# Patient Record
Sex: Female | Born: 1960 | Race: White | Hispanic: No | Marital: Single | Smoking: Former smoker
Health system: Southern US, Community
[De-identification: ages and names within clinical notes are randomized; demographics above are authoritative.]

## PROBLEM LIST (undated history)

## (undated) DIAGNOSIS — R002 Palpitations: Secondary | ICD-10-CM

## (undated) DIAGNOSIS — K5900 Constipation, unspecified: Secondary | ICD-10-CM

## (undated) DIAGNOSIS — M545 Low back pain: Principal | ICD-10-CM

## (undated) DIAGNOSIS — D500 Iron deficiency anemia secondary to blood loss (chronic): Secondary | ICD-10-CM

## (undated) DIAGNOSIS — M5020 Other cervical disc displacement, unspecified cervical region: Secondary | ICD-10-CM

## (undated) DIAGNOSIS — I10 Essential (primary) hypertension: Secondary | ICD-10-CM

## (undated) DIAGNOSIS — B182 Chronic viral hepatitis C: Secondary | ICD-10-CM

## (undated) DIAGNOSIS — F32A Depression, unspecified: Secondary | ICD-10-CM

## (undated) DIAGNOSIS — F419 Anxiety disorder, unspecified: Secondary | ICD-10-CM

## (undated) DIAGNOSIS — G8929 Other chronic pain: Secondary | ICD-10-CM

## (undated) DIAGNOSIS — F329 Major depressive disorder, single episode, unspecified: Secondary | ICD-10-CM

## (undated) DIAGNOSIS — D260 Other benign neoplasm of cervix uteri: Secondary | ICD-10-CM

## (undated) HISTORY — DX: Constipation, unspecified: K59.00

## (undated) HISTORY — DX: Major depressive disorder, single episode, unspecified: F32.9

## (undated) HISTORY — DX: Chronic viral hepatitis C: B18.2

## (undated) HISTORY — DX: Palpitations: R00.2

## (undated) HISTORY — PX: TONSILLECTOMY: SUR1361

## (undated) HISTORY — DX: Other chronic pain: G89.29

## (undated) HISTORY — DX: Anxiety disorder, unspecified: F41.9

## (undated) HISTORY — PX: OTHER SURGICAL HISTORY: SHX169

## (undated) HISTORY — PX: VULVECTOMY: SHX1086

## (undated) HISTORY — PX: HAND SURGERY: SHX662

## (undated) HISTORY — PX: DILATION AND CURETTAGE OF UTERUS: SHX78

## (undated) HISTORY — DX: Other benign neoplasm of cervix uteri: D26.0

## (undated) HISTORY — DX: Low back pain: M54.5

## (undated) HISTORY — DX: Essential (primary) hypertension: I10

## (undated) HISTORY — DX: Depression, unspecified: F32.A

## (undated) HISTORY — DX: Other cervical disc displacement, unspecified cervical region: M50.20

---
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
## 2008-08-29 ENCOUNTER — Emergency Department: Payer: Self-pay | Admitting: Emergency Medicine

## 2008-08-29 ENCOUNTER — Other Ambulatory Visit: Payer: Self-pay

## 2009-02-09 ENCOUNTER — Ambulatory Visit: Payer: Self-pay | Admitting: Family Medicine

## 2009-02-13 ENCOUNTER — Ambulatory Visit: Payer: Self-pay | Admitting: Family Medicine

## 2009-03-02 ENCOUNTER — Ambulatory Visit: Payer: Self-pay | Admitting: Internal Medicine

## 2013-05-23 ENCOUNTER — Emergency Department: Payer: Self-pay | Admitting: Emergency Medicine

## 2013-06-05 ENCOUNTER — Emergency Department: Payer: Self-pay | Admitting: Emergency Medicine

## 2013-06-25 ENCOUNTER — Encounter: Payer: Self-pay | Admitting: Anesthesiology

## 2013-07-04 ENCOUNTER — Encounter: Payer: Self-pay | Admitting: Anesthesiology

## 2013-11-04 ENCOUNTER — Inpatient Hospital Stay: Payer: Self-pay | Admitting: Internal Medicine

## 2013-11-04 ENCOUNTER — Ambulatory Visit: Payer: Self-pay | Admitting: Physician Assistant

## 2013-11-04 LAB — DRUG SCREEN, URINE
Amphetamines, Ur Screen: NEGATIVE (ref ?–1000)
MDMA (Ecstasy)Ur Screen: NEGATIVE (ref ?–500)
Methadone, Ur Screen: NEGATIVE (ref ?–300)
Opiate, Ur Screen: NEGATIVE (ref ?–300)
Phencyclidine (PCP) Ur S: NEGATIVE (ref ?–25)
Tricyclic, Ur Screen: POSITIVE (ref ?–1000)

## 2013-11-04 LAB — CBC
HGB: 13.6 g/dL (ref 12.0–16.0)
MCH: 30.4 pg (ref 26.0–34.0)
MCV: 90 fL (ref 80–100)
RBC: 4.47 10*6/uL (ref 3.80–5.20)
RDW: 14.2 % (ref 11.5–14.5)

## 2013-11-04 LAB — URINALYSIS, COMPLETE
Bacteria: NONE SEEN
Bilirubin,UR: NEGATIVE
Glucose,UR: NEGATIVE mg/dL (ref 0–75)
Nitrite: NEGATIVE
Protein: NEGATIVE
RBC,UR: NONE SEEN /HPF (ref 0–5)

## 2013-11-04 LAB — COMPREHENSIVE METABOLIC PANEL
Bilirubin,Total: 0.2 mg/dL (ref 0.2–1.0)
Calcium, Total: 8.9 mg/dL (ref 8.5–10.1)
Chloride: 110 mmol/L — ABNORMAL HIGH (ref 98–107)
Co2: 31 mmol/L (ref 21–32)
Glucose: 58 mg/dL — ABNORMAL LOW (ref 65–99)
Osmolality: 279 (ref 275–301)
Sodium: 141 mmol/L (ref 136–145)
Total Protein: 7.3 g/dL (ref 6.4–8.2)

## 2013-11-04 LAB — SALICYLATE LEVEL: Salicylates, Serum: 3 mg/dL — ABNORMAL HIGH

## 2013-11-04 LAB — ETHANOL
Ethanol %: <0.003 % (ref 0.000–0.080)
Ethanol: <3 mg/dL

## 2013-11-05 LAB — TRIGLYCERIDES: Triglycerides: 149 mg/dL (ref 0–200)

## 2013-11-05 LAB — BASIC METABOLIC PANEL
Anion Gap: 7 (ref 7–16)
Calcium, Total: 8.3 mg/dL — ABNORMAL LOW (ref 8.5–10.1)
Chloride: 113 mmol/L — ABNORMAL HIGH (ref 98–107)
Co2: 22 mmol/L (ref 21–32)
EGFR (African American): >60
EGFR (Non-African Amer.): >60
Osmolality: 281 (ref 275–301)
Sodium: 142 mmol/L (ref 136–145)

## 2013-11-05 LAB — PHOSPHORUS: Phosphorus: 2.9 mg/dL (ref 2.5–4.9)

## 2013-11-06 LAB — MAGNESIUM: Magnesium: 1.6 mg/dL — ABNORMAL LOW

## 2014-08-06 ENCOUNTER — Encounter: Payer: Self-pay | Admitting: Neurology

## 2014-08-11 ENCOUNTER — Telehealth: Payer: Self-pay | Admitting: Neurology

## 2014-08-11 ENCOUNTER — Ambulatory Visit: Payer: Medicaid Other | Admitting: Neurology

## 2014-08-11 NOTE — Telephone Encounter (Signed)
This patient did not show for a new patient appointment today. 

## 2014-08-18 ENCOUNTER — Ambulatory Visit: Payer: Self-pay | Admitting: Neurology

## 2014-08-18 ENCOUNTER — Ambulatory Visit: Payer: Medicaid Other | Admitting: Neurology

## 2014-08-26 ENCOUNTER — Other Ambulatory Visit: Payer: Self-pay | Admitting: Neurology

## 2014-08-26 ENCOUNTER — Ambulatory Visit (INDEPENDENT_AMBULATORY_CARE_PROVIDER_SITE_OTHER): Payer: Medicaid Other | Admitting: Neurology

## 2014-08-26 ENCOUNTER — Encounter (INDEPENDENT_AMBULATORY_CARE_PROVIDER_SITE_OTHER): Payer: Self-pay

## 2014-08-26 ENCOUNTER — Encounter: Payer: Self-pay | Admitting: Neurology

## 2014-08-26 VITALS — BP 135/91 | HR 95 | Ht 66.0 in | Wt 159.0 lb

## 2014-08-26 DIAGNOSIS — G8929 Other chronic pain: Secondary | ICD-10-CM

## 2014-08-26 DIAGNOSIS — M545 Low back pain, unspecified: Secondary | ICD-10-CM

## 2014-08-26 HISTORY — DX: Other chronic pain: G89.29

## 2014-08-26 HISTORY — DX: Low back pain, unspecified: M54.50

## 2014-08-26 MED ORDER — AMITRIPTYLINE HCL 25 MG PO TABS: 50.0000 mg | ORAL_TABLET | Freq: Every day | ORAL | Status: DC

## 2014-08-26 MED ORDER — TRAMADOL HCL 50 MG PO TABS: 50.0000 mg | ORAL_TABLET | Freq: Four times a day (QID) | ORAL | Status: DC | PRN

## 2014-08-26 NOTE — Telephone Encounter (Signed)
Pt's Rx was faxed to [phone], confirmation received.

## 2014-08-26 NOTE — Progress Notes (Signed)
Reason for visit: Chronic low back pain  Brenda Middleton is a 53 y.o. female  History of present illness:  Brenda Middleton is a 53 year old white female with a history of chronic low back pain that began around 2008. The patient has also had some discomfort in the neck area, and she has had issues with her neck since 2007. The patient has been followed through [facility] at the pain Center. The patient has had several MRI evaluations of the cervical and lumbosacral spine. The last MRI lumbosacral spine has shown evidence of multilevel degenerative disc disease most significant at the Brenda Middleton and Brenda Middleton levels. The patient has disc bulges at these levels with facet joint hypertrophy with mild spinal stenosis and moderate left neuroforaminal stenosis at Brenda Middleton level, moderate to severe neuroforaminal stenosis at Brenda Middleton level bilaterally. There may be moderate to severe spinal stenosis at the Brenda Middleton level. In the past, the patient has gained benefit with epidural steroid injections that may last for 8 months. The patient received an epidural steroid injection 18 months ago that resulted in a footdrop on the left that took 6-8 months to fully recover. The patient has not received any epidural steroid injection since that time. The patient has had MRI evaluation of the cervical spine that shows evidence of degenerative changes at the Brenda Middleton level with moderate to severe neuroforaminal stenosis on the left side. The patient has been treated with gabapentin in the past without benefit. She has been taking Ultram 50 mg twice daily. She is on a low dose of amitriptyline taking 25 mg at night. The patient finds that she feels better if she continues to stretch and move, she feels worse if she sits too long. She has difficulty sleeping at night. She denies issues controlling the bowels or the bladder, but she has had some urinary retention problems in the past. She does report a mild gait problem. She indicates that her back  pain is mainly on the right side, with pain radiating down the right leg to the foot. She has had EMG and nerve conduction studies done previously, but I do not have the results of these studies. She comes to this office for an evaluation.  Past Medical History  Diagnosis Date  . Benign neoplasm of cervix uteri   . Anxiety   . Depressive disorder   . Displacement of cervical intervertebral disc   . Prolapsed cervical intervertebral disc   . Chronic hepatitis C without mention of hepatic coma   . Hypertensive disorder   . Constipation   . Palpitations   . Chronic low back pain 08/26/2014    Past Surgical History  Procedure Laterality Date  . Tonsillectomy    . Conization cervix    . Dilation and curettage of uterus    . Vulvectomy      Family History  Problem Relation Age of Onset  . Cancer    . Stroke    . Breast cancer Mother   . Lung cancer Father   . Cancer Brother     Social history:  reports that she has been smoking Cigarettes.  She has a 24.5 pack-year smoking history. She has never used smokeless tobacco. She reports that she drinks alcohol. She reports that she does not use illicit drugs.  Medications:  Current Outpatient Prescriptions on File Prior to Visit  Medication Sig Dispense Refill  . albuterol (PROVENTIL HFA;VENTOLIN HFA) 108 (90 BASE) MCG/ACT inhaler Inhale 2 puffs into the lungs every  4 (four) hours as needed for wheezing or shortness of breath.      . clonazePAM (KLONOPIN) 0.5 MG tablet Take 0.5 mg by mouth 2 (two) times daily.      . cyclobenzaprine (FLEXERIL) 10 MG tablet Take 10 mg by mouth 3 (three) times daily.      Marland Kitchen lisinopril (PRINIVIL,ZESTRIL) 10 MG tablet Take 10 mg by mouth daily.      . temazepam (RESTORIL) 30 MG capsule Take 30 mg by mouth at bedtime.      Marland Kitchen Umeclidinium-Vilanterol (ANORO ELLIPTA) 62.5-25 MCG/INH AEPB Inhale 1 puff into the lungs daily.      . varenicline (CHANTIX) 1 MG tablet Take 1 mg by mouth 2 (two) times daily.        No current facility-administered medications on file prior to visit.      Allergies  Allergen Reactions  . Penicillin G     Other reaction(s): HIVES  . Penicillin V Itching  . Penicillins     ROS:  Out of a complete 14 system review of symptoms, the patient complains only of the following symptoms, and all other reviewed systems are negative.  Snoring Depression, anxiety, insomnia Restless legs Back pain  Blood pressure 135/91, pulse 95, height 5\' 6"  (1.676 m), weight 159 lb (72.122 kg).  Physical Exam  General: The patient is alert and cooperative at the time of the examination.  Eyes: Pupils are equal, round, and reactive to light. Discs are flat bilaterally.  Neck: The neck is supple, no carotid bruits are noted.  Respiratory: The respiratory examination is clear.  Cardiovascular: The cardiovascular examination reveals a regular rate and rhythm, no obvious murmurs or rubs are noted.  Neuromuscular: The patient has good range of movement of the lumbosacral spine. Minimal discomfort is noted with palpation of the low back.  Skin: Extremities are without significant edema.  Neurologic Exam  Mental status: The patient is alert and oriented x 3 at the time of the examination. The patient has apparent normal recent and remote memory, with an apparently normal attention span and concentration ability.  Cranial nerves: Facial symmetry is present. There is good sensation of the face to pinprick and soft touch bilaterally. The strength of the facial muscles and the muscles to head turning and shoulder shrug are normal bilaterally. Speech is well enunciated, no aphasia or dysarthria is noted. Extraocular movements are full. Visual fields are full. The tongue is midline, and the patient has symmetric elevation of the soft palate. No obvious hearing deficits are noted.  Motor: The motor testing reveals 5 over 5 strength of all 4 extremities. Good symmetric motor tone is noted  throughout. The patient is able to walk on heels and the toes bilaterally.  Sensory: Sensory testing is intact to pinprick, soft touch, vibration sensation, and position sense on all 4 extremities. No evidence of extinction is noted.  Coordination: Cerebellar testing reveals good finger-nose-finger and heel-to-shin bilaterally.  Gait and station: Gait is normal. Tandem gait is normal. Romberg is negative. No drift is seen.  Reflexes: Deep tendon reflexes are symmetric and normal bilaterally. Toes are downgoing bilaterally.   Assessment/Plan:  One. Chronic low back pain, right leg pain  The patient continues to have ongoing back pain, mainly involving the right leg. The patient will be increased on amitriptyline taking 50 mg night, and the dose will be increased if she needs more. The patient was sent for a TENS unit trial. In the future, the patient may be a  candidate for a spinal stimulator. We may consider EMG and nerve conduction studies in the future. I will increase the Ultram taking 50 mg 3 times daily. She will followup in 3 months.  Jill Alexanders MD 08/26/2014 9:10 PM  [facility] [address] [place],  [address]  Phone [phone] Fax [phone]

## 2014-08-26 NOTE — Patient Instructions (Signed)
Back Pain, Adult Low back pain is very common. About 1 in 5 people have back pain.The cause of low back pain is rarely dangerous. The pain often gets better over time.About half of people with a sudden onset of back pain feel better in just 2 weeks. About 8 in 10 people feel better by 6 weeks.  CAUSES Some common causes of back pain include:  Strain of the muscles or ligaments supporting the spine.  Wear and tear (degeneration) of the spinal discs.  Arthritis.  Direct injury to the back. DIAGNOSIS Most of the time, the direct cause of low back pain is not known.However, back pain can be treated effectively even when the exact cause of the pain is unknown.Answering your caregiver's questions about your overall health and symptoms is one of the most accurate ways to make sure the cause of your pain is not dangerous. If your caregiver needs more information, he or she may order lab work or imaging tests (X-rays or MRIs).However, even if imaging tests show changes in your back, this usually does not require surgery. HOME CARE INSTRUCTIONS For many people, back pain returns.Since low back pain is rarely dangerous, it is often a condition that people can learn to manageon their own.   Remain active. It is stressful on the back to sit or stand in one place. Do not sit, drive, or stand in one place for more than 30 minutes at a time. Take short walks on level surfaces as soon as pain allows.Try to increase the length of time you walk each day.  Do not stay in bed.Resting more than 1 or 2 days can delay your recovery.  Do not avoid exercise or work.Your body is made to move.It is not dangerous to be active, even though your back may hurt.Your back will likely heal faster if you return to being active before your pain is gone.  Pay attention to your body when you bend and lift. Many people have less discomfortwhen lifting if they bend their knees, keep the load close to their bodies,and  avoid twisting. Often, the most comfortable positions are those that put less stress on your recovering back.  Find a comfortable position to sleep. Use a firm mattress and lie on your side with your knees slightly bent. If you lie on your back, put a pillow under your knees.  Only take over-the-counter or prescription medicines as directed by your caregiver. Over-the-counter medicines to reduce pain and inflammation are often the most helpful.Your caregiver may prescribe muscle relaxant drugs.These medicines help dull your pain so you can more quickly return to your normal activities and healthy exercise.  Put ice on the injured area.  Put ice in a plastic bag.  Place a towel between your skin and the bag.  Leave the ice on for 15-20 minutes, 03-04 times a day for the first 2 to 3 days. After that, ice and heat may be alternated to reduce pain and spasms.  Ask your caregiver about trying back exercises and gentle massage. This may be of some benefit.  Avoid feeling anxious or stressed.Stress increases muscle tension and can worsen back pain.It is important to recognize when you are anxious or stressed and learn ways to manage it.Exercise is a great option. SEEK MEDICAL CARE IF:  You have pain that is not relieved with rest or medicine.  You have pain that does not improve in 1 week.  You have new symptoms.  You are generally not feeling well. SEEK   IMMEDIATE MEDICAL CARE IF:   You have pain that radiates from your back into your legs.  You develop new bowel or bladder control problems.  You have unusual weakness or numbness in your arms or legs.  You develop nausea or vomiting.  You develop abdominal pain.  You feel faint. Document Released: 11/20/2005 Document Revised: 05/21/2012 Document Reviewed: 03/24/2014 Exit[employer] Patient Information 2015 [employer]. This information is not intended to replace advice given to you by your health care provider. Make sure you  discuss any questions you have with your health care provider.  

## 2014-08-27 ENCOUNTER — Telehealth: Payer: Self-pay | Admitting: Neurology

## 2014-08-27 NOTE — Telephone Encounter (Signed)
I called patient. The neuro rehabilitation people will call her with the appointment, I did increase her tramadol dosage to 3 a day. The patient was given 90 tablets, 2 refills.

## 2014-08-27 NOTE — Telephone Encounter (Signed)
Patient calling to state that she thought her Tramadol dosage would be increased but when she went to pick it up, it's still the same amount. Patient is also wondering about the physical therapy referral. Please return call and advise.

## 2014-08-27 NOTE — Telephone Encounter (Signed)
SEE PHONE NOTE: I TOLD THE PATIENT YESTERDAY SHE WOULD BE CONTACTED BY THE NEURO-REHABILTATION FOR APPOINTMENT.

## 2014-09-16 ENCOUNTER — Ambulatory Visit: Payer: Self-pay | Admitting: Physical Therapy

## 2014-09-30 ENCOUNTER — Encounter: Payer: Self-pay | Admitting: *Deleted

## 2014-10-12 ENCOUNTER — Ambulatory Visit (INDEPENDENT_AMBULATORY_CARE_PROVIDER_SITE_OTHER): Payer: Medicaid Other | Admitting: General Surgery

## 2014-10-12 ENCOUNTER — Telehealth: Payer: Self-pay | Admitting: *Deleted

## 2014-10-12 ENCOUNTER — Encounter: Payer: Self-pay | Admitting: General Surgery

## 2014-10-12 VITALS — BP 124/76 | HR 74 | Resp 12 | Ht 66.0 in | Wt 157.0 lb

## 2014-10-12 DIAGNOSIS — K8010 Calculus of gallbladder with chronic cholecystitis without obstruction: Secondary | ICD-10-CM

## 2014-10-12 NOTE — Progress Notes (Signed)
Patient ID: Brenda Middleton, female   DOB: [birth year], 53 y.o.   MRN: [id number]  Chief Complaint  Patient presents with  . Other    evaluation of gallstones    HPI Brenda Middleton is a 53 y.o. female who presents for an evaluation of gallstones. She complains of abdominal pain that is located in the right upper quadrant. The pain does radiate to her shoulder as well as her back. The pain lasts anywhere from 5-30 minutes at a time. She also states she is having problems with nausea, vomiting, and constipation. This has been present for a couple of weeks. She is unable to eat fruits or spicy foods. She complains of reflux as well.  HPI  Past Medical History  Diagnosis Date  . Benign neoplasm of cervix uteri   . Anxiety   . Depressive disorder   . Displacement of cervical intervertebral disc   . Prolapsed cervical intervertebral disc   . Chronic hepatitis C without mention of hepatic coma   . Hypertensive disorder   . Constipation   . Palpitations   . Chronic low back pain 08/26/2014    Past Surgical History  Procedure Laterality Date  . Tonsillectomy    . Conization cervix    . Dilation and curettage of uterus    . Vulvectomy    . Hand surgery  2008,2015    Carpel Tunnel    Family History  Problem Relation Age of Onset  . Cancer    . Stroke    . Breast cancer Mother   . Lung cancer Father   . Cancer Brother     Social History History  Substance Use Topics  . Smoking status: Current Every Day Smoker -- 0.70 packs/day for 35 years    Types: Cigarettes  . Smokeless tobacco: Never Used  . Alcohol Use: Yes     Comment: occasional wine    Allergies  Allergen Reactions  . Penicillin G     Other reaction(s): HIVES  . Penicillin V Itching  . Penicillins     Current Outpatient Prescriptions  Medication Sig Dispense Refill  . albuterol (PROVENTIL HFA;VENTOLIN HFA) 108 (90 BASE) MCG/ACT inhaler Inhale 2 puffs into the lungs every 4 (four) hours as needed for wheezing  or shortness of breath.    Marland Kitchen amitriptyline (ELAVIL) 25 MG tablet Take 2 tablets (50 mg total) by mouth at bedtime. 60 tablet 3  . clonazePAM (KLONOPIN) 0.5 MG tablet Take 0.5 mg by mouth 2 (two) times daily.    . cyclobenzaprine (FLEXERIL) 10 MG tablet Take 10 mg by mouth 3 (three) times daily.    Marland Kitchen lisinopril (PRINIVIL,ZESTRIL) 10 MG tablet Take 10 mg by mouth daily.    . Suvorexant (BELSOMRA) 20 MG TABS Take 1 tablet by mouth daily.    . temazepam (RESTORIL) 30 MG capsule Take 30 mg by mouth at bedtime.    . traMADol (ULTRAM) 50 MG tablet Take 1 tablet (50 mg total) by mouth every 6 (six) hours as needed. 90 tablet 2  . Umeclidinium-Vilanterol (ANORO ELLIPTA) 62.5-25 MCG/INH AEPB Inhale 1 puff into the lungs daily.     No current facility-administered medications for this visit.    Review of Systems Review of Systems  Constitutional: Negative.   Respiratory: Negative.   Cardiovascular: Negative.   Gastrointestinal: Positive for nausea, vomiting, abdominal pain and constipation.    Blood pressure 124/76, pulse 74, resp. rate 12, height 5\' 6"  (1.676 m), weight 157 lb (71.215 kg).  Physical Exam Physical Exam  Constitutional: She is oriented to person, place, and time. She appears well-developed and well-nourished.  Eyes: Conjunctivae are normal. No scleral icterus.  Neck: Neck supple. No thyromegaly present.  Cardiovascular: Normal rate, regular rhythm and normal heart sounds.   No murmur heard. Pulmonary/Chest: Effort normal and breath sounds normal.  Abdominal: Soft. Normal appearance and bowel sounds are normal. There is no hepatosplenomegaly. There is no tenderness. No hernia.  Lymphadenopathy:    She has no cervical adenopathy.  Neurological: She is alert and oriented to person, place, and time.  Skin: Skin is warm and dry.    Data Reviewed US showed gallstones  Assessment    Symptoms consistent with biliary colic. Explained this , recommended cholecystectomy.  Procedure, risks and benefits explained. Pt is agreeable.    Plan    Patient's surgery has been scheduled for 10-16-14 at [facility].       Cyris Maalouf G 10/12/2014, 12:10 PM

## 2014-10-12 NOTE — Telephone Encounter (Signed)
Patient aware we are unable to do her surgery on 10-16-14 due to a schedule conflict. This patient is agreeable to rescheduling surgery to 10-15-14.

## 2014-10-12 NOTE — Patient Instructions (Addendum)

## 2014-10-13 ENCOUNTER — Telehealth: Payer: Self-pay | Admitting: *Deleted

## 2014-10-13 LAB — CBC WITH DIFFERENTIAL
Basophils Absolute: 0 10*3/uL (ref 0.0–0.2)
Basos: 0 %
EOS: 5 %
Eosinophils Absolute: 0.4 10*3/uL (ref 0.0–0.4)
HCT: 34.2 % (ref 34.0–46.6)
Hemoglobin: 11.1 g/dL (ref 11.1–15.9)
IMMATURE GRANULOCYTES: 0 %
Immature Grans (Abs): 0 10*3/uL (ref 0.0–0.1)
Lymphocytes Absolute: 3.2 10*3/uL — ABNORMAL HIGH (ref 0.7–3.1)
Lymphs: 42 %
MCH: 30 pg (ref 26.6–33.0)
MCHC: 32.5 g/dL (ref 31.5–35.7)
MCV: 92 fL (ref 79–97)
Monocytes Absolute: 0.4 10*3/uL (ref 0.1–0.9)
Monocytes: 6 %
NEUTROS PCT: 47 %
Neutrophils Absolute: 3.5 10*3/uL (ref 1.4–7.0)
PLATELETS: 158 10*3/uL (ref 150–379)
RBC: 3.7 x10E6/uL — AB (ref 3.77–5.28)
RDW: 13.8 % (ref 12.3–15.4)
WBC: 7.5 10*3/uL (ref 3.4–10.8)

## 2014-10-13 LAB — HEPATIC FUNCTION PANEL
ALBUMIN: 4.1 g/dL (ref 3.5–5.5)
ALT: 33 IU/L — ABNORMAL HIGH (ref 0–32)
AST: 37 IU/L (ref 0–40)
Alkaline Phosphatase: 95 IU/L (ref 39–117)
Bilirubin, Direct: 0.07 mg/dL (ref 0.00–0.40)
Total Protein: 6.6 g/dL (ref 6.0–8.5)

## 2014-10-13 LAB — POTASSIUM: POTASSIUM: 4.1 mmol/L (ref 3.5–5.2)

## 2014-10-13 NOTE — Progress Notes (Signed)
Quick Note:  Inform pt labs are normal. Surgery as scheduled. ______

## 2014-10-13 NOTE — Telephone Encounter (Signed)
-----   Message from Christene Lye, MD sent at 10/13/2014  8:02 AM EST ----- Inform pt labs are normal. Surgery as scheduled.

## 2014-10-13 NOTE — Telephone Encounter (Signed)
Notified patient as instructed, patient pleased °

## 2014-10-15 ENCOUNTER — Ambulatory Visit: Payer: Self-pay | Admitting: General Surgery

## 2014-10-15 ENCOUNTER — Encounter: Payer: Self-pay | Admitting: General Surgery

## 2014-10-15 DIAGNOSIS — K8010 Calculus of gallbladder with chronic cholecystitis without obstruction: Secondary | ICD-10-CM

## 2014-10-15 LAB — DRUG SCREEN, URINE
AMPHETAMINES, UR SCREEN: NEGATIVE (ref ?–1000)
Barbiturates, Ur Screen: NEGATIVE (ref ?–200)
Benzodiazepine, Ur Scrn: POSITIVE (ref ?–200)
CANNABINOID 50 NG, UR ~~LOC~~: NEGATIVE (ref ?–50)
COCAINE METABOLITE, UR ~~LOC~~: NEGATIVE (ref ?–300)
MDMA (ECSTASY) UR SCREEN: NEGATIVE (ref ?–500)
Methadone, Ur Screen: NEGATIVE (ref ?–300)
Opiate, Ur Screen: NEGATIVE (ref ?–300)
PHENCYCLIDINE (PCP) UR S: NEGATIVE (ref ?–25)
Tricyclic, Ur Screen: POSITIVE (ref ?–1000)

## 2014-10-16 ENCOUNTER — Encounter: Payer: Self-pay | Admitting: General Surgery

## 2014-10-19 ENCOUNTER — Encounter: Payer: Self-pay | Admitting: General Surgery

## 2014-10-26 ENCOUNTER — Ambulatory Visit: Payer: Self-pay | Admitting: General Surgery

## 2014-11-02 ENCOUNTER — Ambulatory Visit: Payer: Self-pay | Admitting: General Surgery

## 2014-11-16 ENCOUNTER — Ambulatory Visit: Payer: Self-pay | Admitting: General Surgery

## 2014-11-18 ENCOUNTER — Ambulatory Visit: Payer: Self-pay | Admitting: General Surgery

## 2014-11-30 ENCOUNTER — Telehealth: Payer: Self-pay | Admitting: Adult Health

## 2014-11-30 ENCOUNTER — Ambulatory Visit: Payer: Self-pay | Admitting: Adult Health

## 2014-11-30 NOTE — Telephone Encounter (Signed)
Patient no showed for a revisit appointment.  

## 2014-12-02 ENCOUNTER — Encounter: Payer: Self-pay | Admitting: Adult Health

## 2014-12-03 ENCOUNTER — Ambulatory Visit: Payer: Self-pay | Admitting: Adult Health

## 2014-12-03 ENCOUNTER — Telehealth: Payer: Self-pay | Admitting: Adult Health

## 2014-12-03 NOTE — Telephone Encounter (Signed)
This is the patient's 3rd no show. According to office policy she will be dismissed from the practice.

## 2014-12-10 ENCOUNTER — Ambulatory Visit: Payer: Self-pay | Admitting: General Surgery

## 2014-12-14 ENCOUNTER — Ambulatory Visit: Payer: Self-pay | Admitting: General Surgery

## 2014-12-22 ENCOUNTER — Telehealth: Payer: Self-pay | Admitting: *Deleted

## 2014-12-22 ENCOUNTER — Ambulatory Visit: Payer: Self-pay | Admitting: General Surgery

## 2014-12-22 NOTE — Telephone Encounter (Signed)
Patient doing well with no problems. Dr.Sankar states patient to return as needed.

## 2015-02-17 ENCOUNTER — Ambulatory Visit: Payer: Self-pay | Admitting: Adult Health

## 2015-02-22 ENCOUNTER — Encounter: Payer: Self-pay | Admitting: Neurology

## 2015-02-22 ENCOUNTER — Telehealth: Payer: Self-pay | Admitting: Adult Health

## 2015-02-22 NOTE — Telephone Encounter (Signed)
Patient no showed for a revisit appointment on 3/16. This is the 4th no show. She will be dismissed.

## 2015-02-23 ENCOUNTER — Encounter: Payer: Self-pay | Admitting: Adult Health

## 2015-03-05 ENCOUNTER — Telehealth: Payer: Self-pay | Admitting: *Deleted

## 2015-03-05 NOTE — Telephone Encounter (Signed)
Patient calling stating that she is having severe back pain. The patient been dismissed from the practice but wants to be seen anyway for injections. The patient also states that she has left 3 messages for the practice administrator and has not heard anything back yet. Please call the patient and advise.

## 2015-03-18 ENCOUNTER — Ambulatory Visit: Payer: Self-pay | Admitting: Adult Health

## 2015-03-18 ENCOUNTER — Telehealth: Payer: Self-pay | Admitting: Adult Health

## 2015-03-18 NOTE — Telephone Encounter (Signed)
Patient no showed for a revisit appointment. This is now the patient's 5th no show. She should be dismissed.

## 2015-03-25 ENCOUNTER — Ambulatory Visit: Payer: Self-pay | Admitting: Adult Health

## 2015-03-26 NOTE — Consult Note (Signed)
PATIENT NAME:  Brenda Middleton, Brenda Middleton MR#:  [id number] DATE OF BIRTH:  [birth year]  DATE OF CONSULTATION:  11/05/2013  CONSULTING PHYSICIAN:  Gonzella Lex, MD  IDENTIFYING INFORMATION AND REASON FOR CONSULT:  A 54 year old woman brought into the hospital unresponsive needing intubation, possible overdose.  Consultation for possible intentional overdose.   HISTORY OF PRESENT ILLNESS: Information obtained from the patient and the chart. The patient was brought into the hospital after being found unresponsive at home. She required intubation for airway protection. She is now off the ventilator. When she presented, the sister who came in with her showed some bottles of prescription medicine. It was not clear whether the patient might have overdosed on them. The patient tells me now that what she recalls is that for about 4 or 5 days, she was having a lot of trouble sleeping at night. She says this is a chronic problem. She felt so bad about her inability to sleep that she started taking excessive amounts of her prescription medicine. She is prescribed clonazepam and Restoril, as well as amitriptyline by her outpatient provider. She cannot remember how many pills she took but says that it was probably at least 7 or 8 of each of them. The last thing she remembers was going over to a friend's house and feeling dizzy, like she was going to pass out. The patient denies that she was trying to harm herself. Denies any suicidal ideation. Says that her mood recently had been reasonably good. She had not been sleeping well, but denies any suicidal or homicidal ideation. Denies any psychotic symptoms. Denies feeling hopeless or negative about herself. She has been having a worsening of her chronic pain recently. She sees a pain specialist who had cut her off of her Suboxone last month because of irregularities in her drug screen, namely that she had some cocaine in it. The patient says that she has not turned to any other form  of narcotics for relief of her chronic pain.   PAST PSYCHIATRIC HISTORY: The patient denies any history of psychiatric problems in the past. Denies psychiatric hospitalizations. Denies any history of suicide attempts. She denies to me that she has a history of abuse of medication. She does have, according to the chart, a possible past history of opiate abuse, but she does not admit it to me. Does not sound like she has been in substance abuse treatment. She follows up with the Pain Clinic in [place]: The patient says that her pain started about 3 years ago when she was injured on the job getting acute neck and back pain. She minimizes the problem she ever had with opiate use. She has been followed by a Pain Clinic recently and had been on Suboxone which the patient characterizes as just being for pain. She has a history of no other minimal or other significant medical problems besides her chronic pain issue.   SOCIAL HISTORY: Lives with her sister. The patient runs a cleaning business and says that she still does a couple of jobs from time to time, but does not work very much. She does not have any children. Spends a lot of time idle.  FAMILY HISTORY: Denies family history of mental health problems.   CURRENT MEDICATIONS: Takes clonazepam on a regular basis. Also takes Restoril at night on a regular basis. Also amitriptyline, unclear dose, gabapentin and Flexeril. I do not know any of the other doses of any of those medicines.  ALLERGIES: PENICILLIN.   REVIEW OF SYSTEMS: Currently feels like her throat is a little bit sore, feels a little bit tired. Mildly anxious. Denies feeling depressed. Denies hopelessness. Denies suicidal ideation. Denies psychotic symptoms; otherwise negative review of systems besides her chronic neck pain.   MOST RECENT LABORATORIES: Multiple labs done since coming into the hospital. Drug screen initially positive for tricyclics and benzos. Urinalysis  unremarkable. Chemistry panel: Slightly elevated alkaline phosphatase, nothing else really remarkable. Head CT negative.   MENTAL STATUS EXAMINATION: The patient awake, alert, oriented and cooperative. Good eye contact. Normal psychomotor activity. Speech normal in rate, tone and volume. Affect mildly anxious but reactive. Mood stated as okay. Thoughts lucid. No evidence of loosening of associations or delusions. Denies auditory or visual hallucinations. Denies suicidal or homicidal ideation. Adequate judgment and insight, at least currently. No sign of acute dangerousness or psychosis.   ASSESSMENT: This is a 54 year old woman who at least secondhand we are told has a history of opiate abuse. Recently was taken off of her Suboxone. We do not have any evidence that she has been abusing any narcotics recently. She sounds like she was obtunded because of overdose of benzodiazepines, as well as probably the gabapentin and amitriptyline. I think she is telling the truth about not having attempted to kill herself or at least I do not have any evidence to the contrary, and she is not describing depression. I think she is probably minimizing the substance abuse problems that she has had in the past. She was extremely anxious to get me to reassure her that her primary doctors would not find out anything about this hospitalization. She seems very worried that people are going to interpret this as being medication abuse or in some other way a problem that would mean needing to change her medicine.   TREATMENT PLAN: Educated the patient about the risks of abuse of benzodiazepines and tricyclics, including the risk of death, specifically from tricyclics, but also from overdose of any combination of sedating medicines that could cause inability to breathe. Encourage the patient to follow up with outpatient care with her primary care doctor. No need to change or start any new psychiatric treatment. No need for  hospitalization downstairs.   DIAGNOSIS, PRINCIPAL AND PRIMARY:  AXIS I:  Benzodiazepine intoxication, resolving.   SECONDARY DIAGNOSES: AXIS I:  Opiate dependence by history. Rule out benzodiazepine abuse.  AXIS II:  No diagnosis.  AXIS III:  Chronic pain.  AXIS IV:  Moderate from her chronic pain and difficulty she has had with getting treatment and with her own compliance.  AXIS V:  55.   ____________________________ Gonzella Lex, MD jtc:ce D: 11/05/2013 14:03:51 ET T: 11/05/2013 14:58:58 ET JOB#: [id number]  cc: Gonzella Lex, MD, <Dictator> Gonzella Lex MD ELECTRONICALLY SIGNED 11/05/2013 16:01

## 2015-03-26 NOTE — Discharge Summary (Signed)
PATIENT NAME:  Brenda Middleton, Brenda Middleton MR#:  [id number] DATE OF BIRTH:  02/12/1961  DATE OF ADMISSION:  11/04/2013  DATE OF DISCHARGE:  11/06/2013  The patient signed out AGAINST MEDICAL ADVICE at 6:00 a.m. on 11/06/2013, so no medical advice given or medication at the time of discharge.   HISTORY OF PRESENTING ILLNESS: As per H and P by Dr. Fritzi Mandes, a 54 year old Caucasian female with history of chronic back pain and narcotic dependence and addiction in the past, who was being seen at the pain clinic in [facility]. Was discharged from there secondary to noncompliance to the pain clinic recommendation. Currently, was following at a clinic in [place], where she was prescribed   with gabapentin  as per her sister, last time about 10 days ago, she went for follow up and tested positive for cocaine in her urine, so her medications were not refilled by [facility] clinic also. She most likely took gabapentin secondary to her neurotic pain, and was found lethargic by her sister, brought to Emergency Room and intubated for airway protection, placed on ventilator for respiratory management at the time of admission.   HOSPITAL COURSE AND STAY: She was kept in CCU overnight, and next day in the morning, after sedation was taken off, she was waking up, and she was extubated successfully in the afternoon, and after that she remained stable, so we decided to do a psychiatric consult for her drug overdose issue, and Psychiatry cleared her, as there was no suicidal intent in this overdose episode, and they cleared her for further medical management and discharge. Will transfer her to the regular floor for monitoring overnight due to drug overdose issue, and were planning to discharge her in the morning, but she did not want to wait, kept on asking for pain medication and sleeping medication, and then finally signed out herself in the early morning on December 4, before a physician could see her and give advice about discharge  medication.   CONSULT IN THE HOSPITAL: Dr. Alethia Berthold and Dr. Flora Lipps.  IMPORTANT LAB RESULTS: Urine for toxicology TCA positive, benzodiazepines positive. Urinalysis was negative. On ABG, pH was 7.47, pCO2 was 29, and pO2 was 346 on 60% FiO2, which was after intubating her in ER. Magnesium was 1.6.   Total time spent on this discharge:  30 minutes.     ____________________________ Ceasar Lund Anselm Jungling, MD vgv:mr D: 11/08/2013 16:05:57 ET T: 11/08/2013 19:54:34 ET JOB#: [id number]  cc: Ceasar Lund. Anselm Jungling, MD, <Dictator> Vaughan Basta MD ELECTRONICALLY SIGNED 11/11/2013 9:19

## 2015-03-26 NOTE — Consult Note (Signed)
Brief Consult Note: Diagnosis: benzodiazepine intoxication resolving.   Patient was seen by consultant.   Consult note dictated.   Orders entered.   Comments: Psychiatry: Patient seen and chart reviewed. No evidence of intentional overdose or suicidality. DCed the sitter. No need for psych follow up.  Electronic Signatures: Gonzella Lex (MD)  (Signed 03-Dec-14 13:05)  Authored: Brief Consult Note   Last Updated: 03-Dec-14 13:05 by Gonzella Lex (MD)

## 2015-03-26 NOTE — H&P (Signed)
PATIENT NAME:  Brenda Middleton, Brenda Middleton MR#:  [id number] DATE OF BIRTH:  19-Apr-1961  DATE OF ADMISSION:  11/04/2013  PRIMARY CARE PHYSICIAN: Scientist, research (physical sciences) at [facility].   CHIEF COMPLAINT: Found lethargic and unresponsive.   History is obtained from the patient's sister who is present in the Emergency Room. The patient is currently intubated on the vent, unable to provide any history or review of systems.   HISTORY OF PRESENT ILLNESS: Ms. Snead is a 54 year old Caucasian female who has history of chronic back pain who has history of narcotic dependence and "addiction" in the past who was being seen at the pain clinic at [facility]; however, was discharged from there secondary to noncompliance to the pain clinic recommendations. The patient currently is being seen at [facility] in [place] where she was prescribed Suboxone along with gabapentin and Flexeril. Per sister, the last time, about 10 days ago, when the patient went for a followup, she tested positive for cocaine in her urine and, hence, her medications were not refilled from the pain clinic in [place]. Per sister, the patient likely took her gabapentin secondary to her neuropathic pain and was found to be lethargic. Brought to the Emergency Room where she was intubated and placed on a ventilator for airway protection. Currently, her sats are 100% on current vent setting which is SIMV of 12, tidal volume of 500, pressure support of 5 and PEEP of 5. Her blood pressure is 152/93. She is being admitted for further evaluation and management. Her urine drug screen is positive for tricyclic antidepressants and benzodiazepines.   PAST MEDICAL HISTORY:  1. Vulvar carcinoma.  2. Chronic back pain and on narcotics in the past.  3. Ongoing tobacco abuse.   FAMILY HISTORY: Positive for hypertension and COPD.   SOCIAL HISTORY: The patient resides with her sister. She smokes about a 1/2 to 1 pack a day. Has history of substance abuse in the past. Not  sure about her history of alcohol abuse.   REVIEW OF SYSTEMS: Unobtainable since the patient is intubated on the vent.   PHYSICAL EXAMINATION:  VITAL SIGNS: The patient is intubated on the ventilator. She is afebrile. Pulse is 70. Blood pressure is 178/85. Pulse ox is 100% on current vent settings.  HEENT: Atraumatic, normocephalic. PERRLA. EOM intact. Oral mucosa is moist.  NECK: Supple. No JVD. No carotid bruit.  RESPIRATORY: Clear to auscultation bilaterally. No rales, rhonchi, respiratory distress or labored breathing.  HEART: Both of the heart sounds are normal. Rate, rhythm is regular. PMI not lateralized. Chest is nontender.  EXTREMITIES: Good pedal pulses. Good femoral pulses. No lower extremity edema.  ABDOMEN: Soft, benign, nontender. No organomegaly.  NEUROLOGIC: The patient is intubated on the vent. Unable to assess. She is sedated.  SKIN: Warm and dry.   LABORATORIES: UA negative for UTI. Urine drug screen positive for tricyclic antidepressants and benzodiazepines. TSH is 1.37. Salicylates 3. CBC within normal limits except platelet count of 136. Serum ethanol is less than 0.003. Comprehensive metabolic panel within normal limits.   CHEST X-RAY: No pneumothorax. Mediastinal contour is normal. Heart size is normal.   CT OF THE HEAD: No focal acute intracranial abnormality identified.   EKG: Shows normal sinus rhythm, possible left atrial enlargement.   ASSESSMENT AND PLAN: Ms. Dacosta is a 54 year old with history of chronic narcotic dependence in the past along with history of chronic back pain, along with neuropathy. Comes in with:  1. Acute encephalopathy: Appears metabolic in the setting of  overdose. Unknown medication, most likely gabapentin; however, her urine drug screen is positive for benzodiazepines and tricyclic antidepressants. The patient's sister brought several empty bottles of amitriptyline, clonazepam, Flexeril and gabapentin. Unknown amount of medication taken.  The patient is currently intubated on the ventilator for airway protection. Will give supportive treatment with intravenous fluids. Monitor labs and ABG in the morning. Will have pulmonary consultation for ventilator management and possible wean and extubation.  2. Overdose with unknown medication: Will, as above, give supportive care. Will have psychiatry see the patient in consultation.  3. Chronic back pain and history of narcotic dependence in the past: The patient follows up with pain clinic at [facility] in [place]. She has been on Suboxone to wean her off other narcotics per her sister.  4. Gastrointestinal prophylaxis: Will give intravenous Zantac.  5. Deep vein thrombosis prophylaxis: Will give subcutaneous heparin t.i.d.  6. Further workup according to the patient's clinical course. Critical nature of illness was explained to the patient's sister who is agreeable to it.   CRITICAL TIME SPENT: 55 minutes.    ____________________________ Hart Rochester Posey Pronto, MD sap:gb D: 11/04/2013 20:52:01 ET T: 11/04/2013 21:49:54 ET JOB#: [id number]  cc: Estella Malatesta A. Posey Pronto, MD, <Dictator> [facility] [place] SIGNED 11/14/2013 11:08

## 2015-03-27 NOTE — Op Note (Signed)
PATIENT NAME:  Brenda Middleton, Brenda Middleton MR#:  [id number] DATE OF BIRTH:  [birth year]  DATE OF PROCEDURE:  10/15/2014  PREOPERATIVE DIAGNOSIS: Chronic cholecystitis, cholelithiasis.   POSTOPERATIVE DIAGNOSIS: Chronic cholecystitis, cholelithiasis.   OPERATION: Laparoscopy, cholecystectomy with intraoperative cholangiogram.   SURGEON: S.G. Jamal Collin, MD   ANESTHESIA: General.   COMPLICATIONS: None.   ESTIMATED BLOOD LOSS: Less than 50 mL.   DRAINS: None.   DESCRIPTION OF PROCEDURE: The patient was put to sleep in the supine position on the operating table. Abdomen was prepped and draped out as a sterile field. Timeout was performed. Initial entry was at the umbilicus where a small port incision was made. The fascia was lifted up and the Veress needle inserted into the peritoneal cavity and verified with the hanging drop method. Pneumoperitoneum was obtained and a 10 mm port was then placed at this site. With the camera in place, there was good visualization of the peritoneal cavity. Epigastric and 2 lateral 5 mm ports were placed. The gallbladder was noted to be free of acute changes, but it was covered with some adhesions which were also tenting up the duodenum. With careful exposure, these adhesions were taken down with cautery and the duodenum dissected away from the gallbladder Hartmann pouch area. With the gallbladder retracted cephalad, the Hartmann pouch area was pulled up. The common duct was identified and appeared to be of normal size. The cystic duct was identified, which was freed, and an anterior branch of the cystic artery was noted, which was hemoclipped and cut. The cystic duct was proximally hemoclipped and a small opening was made. A cholangiogram catheter was positioned and the balloon inflated to keep it in place. Cholangiogram was then completed. It appeared that the balloon was lodged near the cystic duct and common duct junction and precluded retrograde flow into the proximal ducts.  However, it filled the common bile duct completely and emptied freely, with no filling defects identified. The catheter was removed.  The cystic duct was hemoclipped and cut. The main cystic artery was identified, freed, hemoclipped, and cut. The gallbladder was dissected free from its bed using cautery for control of bleeding. In the fundal portion, the gallbladder bed was somewhat adherent to the gallbladder and required a slow dissection, and there was 1 particular area where there was persistent bleeding which could not be controlled with cautery. It was initially stabilized with the use of a patch of Surgicel and then the area was covered with 5 mL of Surgiflo containing thrombin. Subsequent to that no bleeding was encountered. A small amount of fluid was used to irrigate out the right upper quadrant and suctioned out. With the 5 mm scope in the epigastric port site, the gallbladder was grasped and brought out through the umbilical port site and noted to contain one 1.5 to 2 cm stone. The umbilical fascial opening was closed with a 0 Vicryl placed with the use of a suture passer and the pneumoperitoneum was then released and the ports were removed. All skin incisions were closed with subcuticular 4-0 Vicryl, reinforced with Steri-Strips, and a dry sterile dressing was placed. The patient tolerated the procedure well. There were no immediate problems encountered. Brenda Middleton was subsequently extubated and returned to the recovery room in stable condition.    ____________________________ S.Robinette Haines, MD sgs:ST D: 10/15/2014 16:21:43 ET T: 10/15/2014 22:49:47 ET JOB#: [id number]  cc: Synthia Innocent. Jamal Collin, MD, <Dictator> [facility] Robinette Haines MD ELECTRONICALLY SIGNED 10/19/2014 9:10

## 2015-03-29 LAB — SURGICAL PATHOLOGY

## 2015-09-19 ENCOUNTER — Emergency Department
Admission: EM | Admit: 2015-09-19 | Discharge: 2015-09-19 | Discharge status: Against Medical Advice | Payer: Medicaid Other | Attending: Emergency Medicine | Admitting: Emergency Medicine

## 2015-09-19 DIAGNOSIS — M19041 Primary osteoarthritis, right hand: Secondary | ICD-10-CM

## 2015-09-19 DIAGNOSIS — M7712 Lateral epicondylitis, left elbow: Secondary | ICD-10-CM | POA: Insufficient documentation

## 2015-09-19 DIAGNOSIS — Z720 Tobacco use: Secondary | ICD-10-CM | POA: Insufficient documentation

## 2015-09-19 DIAGNOSIS — Z79899 Other long term (current) drug therapy: Secondary | ICD-10-CM | POA: Insufficient documentation

## 2015-09-19 DIAGNOSIS — M79642 Pain in left hand: Secondary | ICD-10-CM | POA: Diagnosis present

## 2015-09-19 DIAGNOSIS — Z880 Allergy status to penicillin: Secondary | ICD-10-CM | POA: Diagnosis not present

## 2015-09-19 DIAGNOSIS — I10 Essential (primary) hypertension: Secondary | ICD-10-CM | POA: Diagnosis not present

## 2015-09-19 DIAGNOSIS — M19042 Primary osteoarthritis, left hand: Secondary | ICD-10-CM | POA: Diagnosis not present

## 2015-09-19 NOTE — ED Provider Notes (Signed)
[facility] Emergency Department Provider Note ____________________________________________  Time seen: 0940  I have reviewed the triage vital signs and the nursing notes.  HISTORY  Chief Complaint  Hand Pain  HPI Brenda Middleton is a 54 y.o. female reports to the ED for evaluation of acute pain to her hands or wrists that started today. She describes she awoke today with some increased pain to the hands and wrists bilaterally. She was recently started on morphine sulfate, 15 mg by alcohol low pain management clinic earlier this week. She dosed her meds as prescribed yesterday and has not taken any today. She has her wrist wrapped in Ace bandages for support, noting that some compression is comfortable to her pain. She rates her pain at a 10/10 in triage. He denies any injury, trauma, fall, crush injury to her hands at this time. Chest denies any acute swelling or edema to the hands. She does have a history of arthritis, and notes some for me to the knuckles on both hands.  Past Medical History  Diagnosis Date  . Benign neoplasm of cervix uteri   . Anxiety   . Depressive disorder   . Displacement of cervical intervertebral disc   . Prolapsed cervical intervertebral disc   . Chronic hepatitis C without mention of hepatic coma   . Hypertensive disorder   . Constipation   . Palpitations   . Chronic low back pain 08/26/2014    Patient Active Problem List   Diagnosis Date Noted  . Chronic low back pain 08/26/2014    Past Surgical History  Procedure Laterality Date  . Tonsillectomy    . Conization cervix    . Dilation and curettage of uterus    . Vulvectomy    . Hand surgery  [id number]    Carpel Tunnel    Current Outpatient Rx  Name  Route  Sig  Dispense  Refill  . albuterol (PROVENTIL HFA;VENTOLIN HFA) 108 (90 BASE) MCG/ACT inhaler   Inhalation   Inhale 2 puffs into the lungs every 4 (four) hours as needed for wheezing or shortness of breath.         Marland Kitchen amitriptyline (ELAVIL) 25 MG tablet   Oral   Take 2 tablets (50 mg total) by mouth at bedtime.   60 tablet   3   . clonazePAM (KLONOPIN) 0.5 MG tablet   Oral   Take 0.5 mg by mouth 2 (two) times daily.         . cyclobenzaprine (FLEXERIL) 10 MG tablet   Oral   Take 10 mg by mouth 3 (three) times daily.         Marland Kitchen lisinopril (PRINIVIL,ZESTRIL) 10 MG tablet   Oral   Take 10 mg by mouth daily.         . Suvorexant (BELSOMRA) 20 MG TABS   Oral   Take 1 tablet by mouth daily.         . temazepam (RESTORIL) 30 MG capsule   Oral   Take 30 mg by mouth at bedtime.         . traMADol (ULTRAM) 50 MG tablet   Oral   Take 1 tablet (50 mg total) by mouth every 6 (six) hours as needed.   90 tablet   2     Must last 28 days   . Umeclidinium-Vilanterol (ANORO ELLIPTA) 62.5-25 MCG/INH AEPB   Inhalation   Inhale 1 puff into the lungs daily.  Allergies Penicillin g; Penicillin v; and Penicillins  Family History  Problem Relation Age of Onset  . Cancer    . Stroke    . Breast cancer Mother   . Lung cancer Father   . Cancer Brother    Social History Social History  Substance Use Topics  . Smoking status: Current Every Day Smoker -- 0.70 packs/day for 35 years    Types: Cigarettes  . Smokeless tobacco: Never Used  . Alcohol Use: Yes     Comment: occasional wine   Review of Systems  Constitutional: Negative for fever. Eyes: Negative for visual changes. ENT: Negative for sore throat. Cardiovascular: Negative for chest pain. Respiratory: Negative for shortness of breath. Gastrointestinal: Negative for abdominal pain, vomiting and diarrhea. Genitourinary: Negative for dysuria. Musculoskeletal: Negative for back pain. Left greater than right hand and wrist pain as above. Skin: Negative for rash. Neurological: Negative for headaches, focal weakness or numbness. ____________________________________________  PHYSICAL EXAM:  VITAL SIGNS: ED Triage  Vitals  Enc Vitals Group     BP 09/19/15 0816 120/70 mmHg     Pulse Rate 09/19/15 0816 80     Resp 09/19/15 0816 18     Temp 09/19/15 0816 98.5 F (36.9 C)     Temp src --      SpO2 09/19/15 0816 98 %     Weight 09/19/15 0816 150 lb (68.04 kg)     Height 09/19/15 0816 '5\' 6"'$  (1.676 m)     Head Cir --      Peak Flow --      Pain Score 09/19/15 0819 10     Pain Loc --      Pain Edu? --      Excl. in [place]? --    Constitutional: Alert and oriented. Well appearing and in no distress. Head: Normocephalic and atraumatic.      Eyes: Conjunctivae are normal. PERRL. Normal extraocular movements      Ears: Canals clear. TMs intact bilaterally.   Nose: No congestion/rhinorrhea.   Mouth/Throat: Mucous membranes are moist.   Neck: Supple. No thyromegaly. Hematological/Lymphatic/Immunological: No cervical lymphadenopathy. Cardiovascular: Normal rate, regular rhythm.  Respiratory: Normal respiratory effort. No wheezes/rales/rhonchi. Gastrointestinal: Soft and nontender. No distention. Musculoskeletal: Bilateral hands with dorsal MCP swelling primarily at the second and third fingers consistent with some arthritic bony changes. Patient with normal composite fist bilaterally. She is mainly tender to palpation over the left lateral epicondyle on exam. Negative Finkelstein. Nontender with normal range of motion in all extremities.  Neurologic:  Cranial nerves II through XII grossly intact. Normal intrinsic and opposition testing. Negative carpal or cubital Tinel's on exam. Normal gait without ataxia. Normal speech and language. No gross focal neurologic deficits are appreciated. Skin:  Skin is warm, dry and intact. No rash noted. Psychiatric: Mood and affect are normal. Patient exhibits appropriate insight and judgment. ____________________________________________  INITIAL IMPRESSION / ASSESSMENT AND PLAN / ED COURSE  Bilateral hand pain due to OA and left lateral epicondylitis. Follow-up with  [facility] as needed. Patient will dose previously prescribed ibuprofen for pain and swelling. She will continue the use of her bilateral ace wraps. ____________________________________________  FINAL CLINICAL IMPRESSION(S) / ED DIAGNOSES  Final diagnoses:  Primary osteoarthritis of both hands  Lateral epicondylitis (tennis elbow), left      Melvenia Needles, PA-C 09/19/15 1615  Hinda Kehr, MD 09/20/15 1352

## 2015-09-19 NOTE — ED Notes (Signed)
Patient found walking in hall with family member. Asked where she was going said "the lady was going to get a splint for me, but I think this will work (pointing to wrist ace wrap) Told patient she was not discharged yet and did not have her paperwork.  Pt said she was just going to leave.

## 2015-09-19 NOTE — ED Notes (Signed)
Patient states she has a history of spinal stenosis and joint pain. Yesterday her hands and wrists started hurting and today she could barely move them.  She recently started going to [facility] this week and was prescribed Morphine Sulfate '15mg'$  TID. Took 3 tablets yesterday but has not taken any today.  Both wrists wrapped in ace bandages. C/o pain 10/10.

## 2015-09-19 NOTE — ED Notes (Signed)
Pt c/o generalized joint pain that has worsened in the past 2 days with a hx of chronic pain that she is treated for at the pain clinic.Brenda Middleton

## 2015-09-19 NOTE — Discharge Instructions (Signed)
Osteoarthritis Osteoarthritis is a disease that causes soreness and inflammation of a joint. It occurs when the cartilage at the affected joint wears down. Cartilage acts as a cushion, covering the ends of bones where they meet to form a joint. Osteoarthritis is the most common form of arthritis. It often occurs in older people. The joints affected most often by this condition include those in the:  Ends of the fingers.  Thumbs.  Neck.  Lower back.  Knees.  Hips. CAUSES  Over time, the cartilage that covers the ends of bones begins to wear away. This causes bone to rub on bone, producing pain and stiffness in the affected joints.  RISK FACTORS Certain factors can increase your chances of having osteoarthritis, including:  Older age.  Excessive body weight.  Overuse of joints.  Previous joint injury. SIGNS AND SYMPTOMS   Pain, swelling, and stiffness in the joint.  Over time, the joint may lose its normal shape.  Small deposits of bone (osteophytes) may grow on the edges of the joint.  Bits of bone or cartilage can break off and float inside the joint space. This may cause more pain and damage. DIAGNOSIS  Your health care provider will do a physical exam and ask about your symptoms. Various tests may be ordered, such as:  X-rays of the affected joint.  Blood tests to rule out other types of arthritis. Additional tests may be used to diagnose your condition. TREATMENT  Goals of treatment are to control pain and improve joint function. Treatment plans may include:  A prescribed exercise program that allows for rest and joint relief.  A weight control plan.  Pain relief techniques, such as:  Properly applied heat and cold.  Electric pulses delivered to nerve endings under the skin (transcutaneous electrical nerve stimulation [TENS]).  Massage.  Certain nutritional supplements.  Medicines to control pain, such as:  Acetaminophen.  Nonsteroidal  anti-inflammatory drugs (NSAIDs), such as naproxen.  Narcotic or central-acting agents, such as tramadol.  Corticosteroids. These can be given orally or as an injection.  Surgery to reposition the bones and relieve pain (osteotomy) or to remove loose pieces of bone and cartilage. Joint replacement may be needed in advanced states of osteoarthritis. HOME CARE INSTRUCTIONS   Take medicines only as directed by your health care provider.  Maintain a healthy weight. Follow your health care provider's instructions for weight control. This may include dietary instructions.  Exercise as directed. Your health care provider can recommend specific types of exercise. These may include:  Strengthening exercises. These are done to strengthen the muscles that support joints affected by arthritis. They can be performed with weights or with exercise bands to add resistance.  Aerobic activities. These are exercises, such as brisk walking or low-impact aerobics, that get your heart pumping.  Range-of-motion activities. These keep your joints limber.  Balance and agility exercises. These help you maintain daily living skills.  Rest your affected joints as directed by your health care provider.  Keep all follow-up visits as directed by your health care provider. SEEK MEDICAL CARE IF:   Your skin turns red.  You develop a rash in addition to your joint pain.  You have worsening joint pain.  You have a fever along with joint or muscle aches. SEEK IMMEDIATE MEDICAL CARE IF:  You have a significant loss of weight or appetite.  You have night sweats. [place] of Arthritis and Musculoskeletal and Skin Diseases: www.niams.SouthExposed.es  National Institute on  Aging: http://kim-miller.com/  American College of Rheumatology: www.rheumatology.org   This information is not intended to replace advice given to you by your health care provider. Make sure you discuss any questions you  have with your health care provider.   Document Released: 11/20/2005 Document Revised: 12/11/2014 Document Reviewed: 07/28/2013 Elsevier Interactive Patient Education 2016 [employer].  Tennis Elbow Tennis elbow (lateral epicondylitis) is inflammation of the outer tendons of your forearm close to your elbow. Your tendons attach your muscles to your bones. The outer tendons of your forearm are used to extend your wrist, and they attach on the outside part of your elbow. Tennis elbow is often found in people who play tennis, but anyone may get the condition from repeatedly extending the wrist or turning the forearm. CAUSES This condition is caused by repeatedly extending your wrist and using your hands. It can result from sports or work that requires repetitive forearm movements. Tennis elbow may also be caused by an injury. RISK FACTORS You have a higher risk of developing tennis elbow if you play tennis or another racquet sport. You also have a higher risk if you frequently use your hands for work. This condition is also more likely to develop in:  Musicians.  Carpenters, painters, and plumbers.  Cooks.  Cashiers.  People who work in [employer].  Architect workers.  Butchers.  People who use computers. SYMPTOMS Symptoms of this condition include:  Pain and tenderness in your forearm and the outer part of your elbow. You may only feel the pain when you use your arm, or you may feel it even when you are not using your arm.  A burning feeling that runs from your elbow through your arm.  Weak grip in your hands. DIAGNOSIS  This condition may be diagnosed by medical history and physical exam. You may also have other tests, including:  X-rays.  MRI. TREATMENT Your health care provider will recommend lifestyle adjustments, such as resting and icing your arm. Treatment may also include:  Medicines for inflammation. This may include shots of cortisone if your pain  continues.  Physical therapy. This may include massage or exercises.  An elbow brace. Surgery may eventually be recommended if your pain does not go away with treatment. HOME CARE INSTRUCTIONS Activity  Rest your elbow and wrist as directed by your health care provider. Try to avoid any activities that caused the problem until your health care provider says that you can do them again.  If a physical therapist teaches you exercises, do all of them as directed.  If you lift an object, lift it with your palm facing upward. This lowers the stress on your elbow. Lifestyle  If your tennis elbow is caused by sports, check your equipment and make sure that:  You are using it correctly.  It is the best fit for you.  If your tennis elbow is caused by work, take breaks frequently, if you are able. Talk with your manager about how to best perform tasks in a way that is safe.  If your tennis elbow is caused by computer use, talk with your manager about any changes that can be made to your work environment. General Instructions  If directed, apply ice to the painful area:  Put ice in a plastic bag.  Place a towel between your skin and the bag.  Leave the ice on for 20 minutes, 2-3 times per day.  Take medicines only as directed by your health care provider.  If you were  given a brace, wear it as directed by your health care provider.  Keep all follow-up visits as directed by your health care provider. This is important. SEEK MEDICAL CARE IF:  Your pain does not get better with treatment.  Your pain gets worse.  You have numbness or weakness in your forearm, hand, or fingers.   This information is not intended to replace advice given to you by your health care provider. Make sure you discuss any questions you have with your health care provider.   Document Released: 11/20/2005 Document Revised: 04/06/2015 Document Reviewed: 11/16/2014 Elsevier Interactive Patient Education 2016  [place] home meds as previously prescribed.  Follow-up with [facility] for medication management. Continue Ace wraps as needed for support. Dose ibuprofen for pain and inflammation.

## 2015-10-25 ENCOUNTER — Other Ambulatory Visit: Payer: Self-pay | Admitting: Gastroenterology

## 2015-10-25 DIAGNOSIS — B182 Chronic viral hepatitis C: Secondary | ICD-10-CM

## 2015-11-01 ENCOUNTER — Ambulatory Visit
Admission: RE | Admit: 2015-11-01 | Discharge: 2015-11-01 | Disposition: A | Discharge status: Home / Self Care | Payer: Medicaid Other | Source: Ambulatory Visit | Attending: Gastroenterology | Admitting: Gastroenterology

## 2015-11-01 DIAGNOSIS — B182 Chronic viral hepatitis C: Secondary | ICD-10-CM | POA: Diagnosis not present

## 2015-12-27 ENCOUNTER — Emergency Department: Payer: Medicaid Other

## 2015-12-27 ENCOUNTER — Encounter: Payer: Self-pay | Admitting: Emergency Medicine

## 2015-12-27 ENCOUNTER — Emergency Department
Admission: EM | Admit: 2015-12-27 | Discharge: 2015-12-27 | Disposition: A | Discharge status: Home / Self Care | Payer: Medicaid Other | Attending: Emergency Medicine | Admitting: Emergency Medicine

## 2015-12-27 DIAGNOSIS — R55 Syncope and collapse: Secondary | ICD-10-CM | POA: Diagnosis not present

## 2015-12-27 DIAGNOSIS — Z79899 Other long term (current) drug therapy: Secondary | ICD-10-CM | POA: Insufficient documentation

## 2015-12-27 DIAGNOSIS — R197 Diarrhea, unspecified: Secondary | ICD-10-CM | POA: Insufficient documentation

## 2015-12-27 DIAGNOSIS — R000 Tachycardia, unspecified: Secondary | ICD-10-CM | POA: Diagnosis not present

## 2015-12-27 DIAGNOSIS — Y9389 Activity, other specified: Secondary | ICD-10-CM | POA: Diagnosis not present

## 2015-12-27 DIAGNOSIS — Z79891 Long term (current) use of opiate analgesic: Secondary | ICD-10-CM | POA: Diagnosis not present

## 2015-12-27 DIAGNOSIS — X58XXXA Exposure to other specified factors, initial encounter: Secondary | ICD-10-CM

## 2015-12-27 DIAGNOSIS — S8991XA Unspecified injury of right lower leg, initial encounter: Secondary | ICD-10-CM | POA: Diagnosis not present

## 2015-12-27 DIAGNOSIS — Z880 Allergy status to penicillin: Secondary | ICD-10-CM | POA: Insufficient documentation

## 2015-12-27 DIAGNOSIS — S8261XA Displaced fracture of lateral malleolus of right fibula, initial encounter for closed fracture: Secondary | ICD-10-CM | POA: Diagnosis not present

## 2015-12-27 DIAGNOSIS — Y9289 Other specified places as the place of occurrence of the external cause: Secondary | ICD-10-CM | POA: Insufficient documentation

## 2015-12-27 DIAGNOSIS — F17210 Nicotine dependence, cigarettes, uncomplicated: Secondary | ICD-10-CM | POA: Insufficient documentation

## 2015-12-27 DIAGNOSIS — T731XXA Deprivation of water, initial encounter: Secondary | ICD-10-CM

## 2015-12-27 DIAGNOSIS — R109 Unspecified abdominal pain: Secondary | ICD-10-CM | POA: Insufficient documentation

## 2015-12-27 DIAGNOSIS — E860 Dehydration: Secondary | ICD-10-CM | POA: Insufficient documentation

## 2015-12-27 DIAGNOSIS — I10 Essential (primary) hypertension: Secondary | ICD-10-CM | POA: Insufficient documentation

## 2015-12-27 DIAGNOSIS — R1110 Vomiting, unspecified: Secondary | ICD-10-CM | POA: Diagnosis not present

## 2015-12-27 DIAGNOSIS — W1839XA Other fall on same level, initial encounter: Secondary | ICD-10-CM | POA: Diagnosis not present

## 2015-12-27 DIAGNOSIS — S99911A Unspecified injury of right ankle, initial encounter: Secondary | ICD-10-CM | POA: Diagnosis present

## 2015-12-27 DIAGNOSIS — Y998 Other external cause status: Secondary | ICD-10-CM | POA: Insufficient documentation

## 2015-12-27 LAB — COMPREHENSIVE METABOLIC PANEL
ALBUMIN: 4 g/dL (ref 3.5–5.0)
ALT: 35 U/L (ref 14–54)
ANION GAP: 8 (ref 5–15)
AST: 39 U/L (ref 15–41)
Alkaline Phosphatase: 92 U/L (ref 38–126)
BUN: 10 mg/dL (ref 6–20)
CHLORIDE: 105 mmol/L (ref 101–111)
CO2: 25 mmol/L (ref 22–32)
Calcium: 9.5 mg/dL (ref 8.9–10.3)
Creatinine, Ser: 0.91 mg/dL (ref 0.44–1.00)
GFR calc Af Amer: >60 mL/min (ref >60)
GFR calc non Af Amer: >60 mL/min (ref >60)
GLUCOSE: 121 mg/dL — AB (ref 65–99)
POTASSIUM: 4.3 mmol/L (ref 3.5–5.1)
SODIUM: 138 mmol/L (ref 135–145)
Total Bilirubin: 0.8 mg/dL (ref 0.3–1.2)
Total Protein: 7.8 g/dL (ref 6.5–8.1)

## 2015-12-27 LAB — CBC WITH DIFFERENTIAL/PLATELET
BASOS PCT: 1 %
Basophils Absolute: 0.1 10*3/uL (ref 0–0.1)
Eosinophils Absolute: 0.3 10*3/uL (ref 0–0.7)
Eosinophils Relative: 3 %
HEMATOCRIT: 39.6 % (ref 35.0–47.0)
HEMOGLOBIN: 13 g/dL (ref 12.0–16.0)
LYMPHS ABS: 2 10*3/uL (ref 1.0–3.6)
LYMPHS PCT: 19 %
MCH: 27.1 pg (ref 26.0–34.0)
MCHC: 32.8 g/dL (ref 32.0–36.0)
MCV: 82.7 fL (ref 80.0–100.0)
MONOS PCT: 7 %
Monocytes Absolute: 0.8 10*3/uL (ref 0.2–0.9)
NEUTROS ABS: 7.5 10*3/uL — AB (ref 1.4–6.5)
NEUTROS PCT: 70 %
Platelets: 163 10*3/uL (ref 150–440)
RBC: 4.79 MIL/uL (ref 3.80–5.20)
RDW: 24.8 % — ABNORMAL HIGH (ref 11.5–14.5)
WBC: 10.6 10*3/uL (ref 3.6–11.0)

## 2015-12-27 LAB — PHOSPHORUS: PHOSPHORUS: 3.6 mg/dL (ref 2.5–4.6)

## 2015-12-27 LAB — MAGNESIUM: Magnesium: 1.9 mg/dL (ref 1.7–2.4)

## 2015-12-27 LAB — LIPASE, BLOOD: Lipase: 39 U/L (ref 11–51)

## 2015-12-27 MED ORDER — SODIUM CHLORIDE 0.9 % IV BOLUS (SEPSIS)
2000.0000 mL | Freq: Once | INTRAVENOUS | Status: AC
  Administered 2015-12-27: 2000 mL via INTRAVENOUS

## 2015-12-27 MED ORDER — KETOROLAC TROMETHAMINE 30 MG/ML IJ SOLN
30.0000 mg | Freq: Once | INTRAMUSCULAR | Status: AC
  Administered 2015-12-27: 30 mg via INTRAVENOUS
  Filled 2015-12-27: qty 1

## 2015-12-27 MED ORDER — OXYCODONE-ACETAMINOPHEN 5-325 MG PO TABS: 1.0000 | ORAL_TABLET | Freq: Four times a day (QID) | ORAL | Status: DC | PRN

## 2015-12-27 MED ORDER — ONDANSETRON HCL 4 MG/2ML IJ SOLN
4.0000 mg | Freq: Once | INTRAMUSCULAR | Status: AC
  Administered 2015-12-27: 4 mg via INTRAVENOUS
  Filled 2015-12-27: qty 2

## 2015-12-27 MED ORDER — ONDANSETRON 8 MG PO TBDP: 8.0000 mg | ORAL_TABLET | Freq: Three times a day (TID) | ORAL | Status: DC | PRN

## 2015-12-27 NOTE — ED Provider Notes (Signed)
[facility] Emergency Department Provider Note  ____________________________________________  Time seen: 9:00 AM  I have reviewed the triage vital signs and the nursing notes.   HISTORY  Chief Complaint Ankle Pain and Knee Pain    HPI Brenda Middleton is a 55 y.o. female who reports that she is having viral illness recently with frequent vomiting and diarrhea. She's had a hard time getting enough oral intake to stay hydrated. Yesterday after getting up quickly she ever lightheaded and fell down. Afterwards she had pain in the right ankle and right knee with swelling of the right ankle and difficulty bearing weight. Denies any head injury. There were no preceding symptoms other than the lightheadedness for the fall, no chest pain chart cigarette headache numbness tingling or weakness.     Past Medical History  Diagnosis Date  . Benign neoplasm of cervix uteri   . Anxiety   . Depressive disorder   . Displacement of cervical intervertebral disc   . Prolapsed cervical intervertebral disc   . Chronic hepatitis C without mention of hepatic coma   . Hypertensive disorder   . Constipation   . Palpitations   . Chronic low back pain 08/26/2014     Patient Active Problem List   Diagnosis Date Noted  . Chronic low back pain 08/26/2014     Past Surgical History  Procedure Laterality Date  . Tonsillectomy    . Conization cervix    . Dilation and curettage of uterus    . Vulvectomy    . Hand surgery  4098,[id number]    Carpel Tunnel     Current Outpatient Rx  Name  Route  Sig  Dispense  Refill  . albuterol (PROVENTIL HFA;VENTOLIN HFA) 108 (90 BASE) MCG/ACT inhaler   Inhalation   Inhale 2 puffs into the lungs every 4 (four) hours as needed for wheezing or shortness of breath.         Marland Kitchen amitriptyline (ELAVIL) 25 MG tablet   Oral   Take 2 tablets (50 mg total) by mouth at bedtime.   60 tablet   3   . clonazePAM (KLONOPIN) 0.5 MG tablet   Oral   Take  0.5 mg by mouth 2 (two) times daily.         . cyclobenzaprine (FLEXERIL) 10 MG tablet   Oral   Take 10 mg by mouth 3 (three) times daily.         Marland Kitchen Linaclotide (LINZESS) 145 MCG CAPS capsule   Oral   Take 1 capsule by mouth daily.         Marland Kitchen lisinopril (PRINIVIL,ZESTRIL) 10 MG tablet   Oral   Take 10 mg by mouth daily.         Marland Kitchen morphine (MSIR) 15 MG tablet   Oral   Take 15 mg by mouth 3 (three) times daily.         . Suvorexant (BELSOMRA) 20 MG TABS   Oral   Take 1 tablet by mouth daily.         . temazepam (RESTORIL) 30 MG capsule   Oral   Take 30 mg by mouth at bedtime.         . traMADol (ULTRAM) 50 MG tablet   Oral   Take 1 tablet (50 mg total) by mouth every 6 (six) hours as needed.   90 tablet   2     Must last 28 days   . Umeclidinium-Vilanterol (ANORO ELLIPTA) 62.5-25 MCG/INH AEPB  Inhalation   Inhale 1 puff into the lungs daily.         . ondansetron (ZOFRAN ODT) 8 MG disintegrating tablet   Oral   Take 1 tablet (8 mg total) by mouth every 8 (eight) hours as needed for nausea or vomiting.   20 tablet   0   . oxyCODONE-acetaminophen (ROXICET) 5-325 MG tablet   Oral   Take 1 tablet by mouth every 6 (six) hours as needed for severe pain.   12 tablet   0      Allergies Penicillin g; Penicillin v; and Penicillins   Family History  Problem Relation Age of Onset  . Cancer    . Stroke    . Breast cancer Mother   . Lung cancer Father   . Cancer Brother     Social History Social History  Substance Use Topics  . Smoking status: Current Every Day Smoker -- 0.70 packs/day for 35 years    Types: Cigarettes  . Smokeless tobacco: Never Used  . Alcohol Use: Yes     Comment: occasional wine    Review of Systems  Constitutional:   No fever or chills. No weight changes Eyes:   No blurry vision or double vision.  ENT:   No sore throat. Cardiovascular:   No chest pain. Respiratory:   No dyspnea or cough. Gastrointestinal:    Positive for abdominal pain, vomiting and diarrhea.  No BRBPR or melena. Genitourinary:   Negative for dysuria, urinary retention, bloody urine, or difficulty urinating. Musculoskeletal:   Negative for back pain. Right knee and right ankle pain  Skin:   Negative for rash. Neurological:   Negative for headaches, focal weakness or numbness. Psychiatric:  No anxiety or depression.   Endocrine:  No hot/cold intolerance, changes in energy, or sleep difficulty.  10-point ROS otherwise negative.  ____________________________________________   PHYSICAL EXAM:  VITAL SIGNS: ED Triage Vitals  Enc Vitals Group     BP 12/27/15 0856 90/68 mmHg     Pulse Rate 12/27/15 0856 120     Resp 12/27/15 0856 18     Temp 12/27/15 0856 98.3 F (36.8 C)     Temp Source 12/27/15 0856 Oral     SpO2 12/27/15 0856 97 %     Weight 12/27/15 0856 150 lb (68.04 kg)     Height 12/27/15 0856 '5\' 6"'$  (1.676 m)     Head Cir --      Peak Flow --      Pain Score 12/27/15 0857 10     Pain Loc --      Pain Edu? --      Excl. in [place]? --     Vital signs reviewed, nursing assessments reviewed.   Constitutional:   Alert and oriented. Well appearing and in no distress. Eyes:   No scleral icterus. No conjunctival pallor. PERRL. EOMI ENT   Head:   Normocephalic and atraumatic.   Nose:   No congestion/rhinnorhea. No septal hematoma   Mouth/Throat:   Dry mucous membranes, no pharyngeal erythema. No peritonsillar mass. No uvula shift.   Neck:   No stridor. No SubQ emphysema. No meningismus. Hematological/Lymphatic/Immunilogical:   No cervical lymphadenopathy. Cardiovascular:   Tachycardia heart rate 120. Normal and symmetric distal pulses are present in all extremities. No murmurs, rubs, or gallops. Respiratory:   Normal respiratory effort without tachypnea nor retractions. Breath sounds are clear and equal bilaterally. No wheezes/rales/rhonchi. Gastrointestinal:   Soft and nontender. No distention. There is  no  CVA tenderness.  No rebound, rigidity, or guarding. Genitourinary:   deferred Musculoskeletal:   Right hip unremarkable. Right femur nontender without deformity. Right knee shows some point tenderness in the area of the medial proximal tibia. Ligaments are stable about the knee and there is no significant joint effusion. Tibia diaphysis is unremarkable as is the fibula. At the ankle there is significant soft tissue swelling and tenderness at the lateral malleolus with some slight deformity. The ankle is located with intact range of motion. There is no pain tenderness or swelling of the foot. Other extremities are unremarkable with full range of motion Neurologic:   Normal speech and language.  CN 2-10 normal. Motor grossly intact. No gross focal neurologic deficits are appreciated.  Skin:    Skin is warm, dry and intact. No rash noted.  No petechiae, purpura, or bullae. Psychiatric:   Mood and affect are normal. Speech and behavior are normal. Patient exhibits appropriate insight and judgment.  ____________________________________________    LABS (pertinent positives/negatives) (all labs ordered are listed, but only abnormal results are displayed) Labs Reviewed  COMPREHENSIVE METABOLIC PANEL - Abnormal; Notable for the following:    Glucose, Bld 121 (*)    All other components within normal limits  CBC WITH DIFFERENTIAL/PLATELET - Abnormal; Notable for the following:    RDW 24.8 (*)    Neutro Abs 7.5 (*)    All other components within normal limits  LIPASE, BLOOD  MAGNESIUM  PHOSPHORUS  URINALYSIS COMPLETEWITH MICROSCOPIC ([facility] ONLY)   ____________________________________________   EKG  EKG interpreted by me Sinus tachycardia rate 114, left axis, normal intervals. Normal QRS ST segments and T waves  ____________________________________________    RADIOLOGY  X-ray right knee unremarkable X-ray right ankle shows fracture of the lateral malleolus with mild comminution and  mild displacement. Joint appears stable  ____________________________________________   PROCEDURES SPLINT APPLICATION Date/Time: 70:62 AM Authorized by: Joni Fears, Guliana Weyandt Consent: Verbal consent obtained. Risks and benefits: risks, benefits and alternatives were discussed Consent given by: patient Splint applied by: Myself and orthopedic technician Location details: Right ankle  Splint type: Short leg 3 sided  Supplies used: Ortho-Glass  Post-procedure: The splinted body part was neurovascularly unchanged following the procedure. Patient tolerance: Patient tolerated the procedure well with no immediate complications.     ____________________________________________   INITIAL IMPRESSION / ASSESSMENT AND PLAN / ED COURSE  Pertinent labs & imaging results that were available during my care of the patient were reviewed by me and considered in my medical decision making (see chart for details).  Patient presents with ankle and knee pain that clinically appears to be a ankle fracture after syncope related to dehydration. We'll give IV fluids and antiemetics in the emergency department for the dehydration will checking labs. We'll get x-rays as well.  ----------------------------------------- 10:55 AM on 12/27/2015 -----------------------------------------  X-rays reveal an isolated lateral malleolus fracture which is not severely displaced. This injury is manageable with outpatient follow-up. We'll place a splint to do crutch teaching and discharged.    ----------------------------------------- 11:53 AM on 12/27/2015 -----------------------------------------  Pain controlled after splinting. Splint in good position. Neurovascularly intact afterward. Patient stable for discharge.   ____________________________________________   FINAL CLINICAL IMPRESSION(S) / ED DIAGNOSES  Final diagnoses:  Ankle fracture, lateral malleolus, closed, right, initial encounter  Accident due  to dehydration      Carrie Mew, MD 12/27/15 1153

## 2015-12-27 NOTE — ED Notes (Signed)
Patient returned from xray.

## 2015-12-27 NOTE — Discharge Instructions (Signed)
You were prescribed a medication that is potentially sedating. Do not drink alcohol, drive or participate in any other potentially dangerous activities while taking this medication as it may make you sleepy. Do not take this medication with any other sedating medications, either prescription or over-the-counter. If you were prescribed Percocet or Vicodin, do not take these with acetaminophen (Tylenol) as it is already contained within these medications.   Opioid pain medications (or "narcotics") can be habit forming.  Use it as little as possible to achieve adequate pain control.  Do not use or use it with extreme caution if you have a history of opiate abuse or dependence.  If you are on a pain contract with your primary care doctor or a pain specialist, be sure to let them know you were prescribed this medication today from the [facility] Emergency Department.  This medication is intended for your use only - do not give any to anyone else and keep it in a secure place where nobody else, especially children and pets, have access to it.  It will also cause or worsen constipation, so you may want to consider taking an over-the-counter stool softener while you are taking this medication.  Ankle Fracture A fracture is a break in a bone. The ankle joint is made up of three bones. These include the lower (distal)sections of your lower leg bones, called the tibia and fibula, along with a bone in your foot, called the talus. Depending on how bad the break is and if more than one ankle joint bone is broken, a cast or splint is used to protect and keep your injured bone from moving while it heals. Sometimes, surgery is required to help the fracture heal properly.  There are two general types of fractures:  Stable fracture. This includes a single fracture line through one bone, with no injury to ankle ligaments. A fracture of the talus that does not have any displacement (movement of the bone on either side of  the fracture line) is also stable.  Unstable fracture. This includes more than one fracture line through one or more bones in the ankle joint. It also includes fractures that have displacement of the bone on either side of the fracture line. CAUSES  A direct blow to the ankle.   Quickly and severely twisting your ankle.  Trauma, such as a car accident or falling from a significant height. RISK FACTORS You may be at a higher risk of ankle fracture if:  You have certain medical conditions.  You are involved in high-impact sports.  You are involved in a high-impact car accident. SIGNS AND SYMPTOMS   Tender and swollen ankle.  Bruising around the injured ankle.  Pain on movement of the ankle.  Difficulty walking or putting weight on the ankle.  A cold foot below the site of the ankle injury. This can occur if the blood vessels passing through your injured ankle were also damaged.  Numbness in the foot below the site of the ankle injury. DIAGNOSIS  An ankle fracture is usually diagnosed with a physical exam and X-rays. A CT scan may also be required for complex fractures. TREATMENT  Stable fractures are treated with a cast or splint and using crutches to avoid putting weight on your injured ankle. This is followed by an ankle strengthening program. Some patients require a special type of cast, depending on other medical problems they may have. Unstable fractures require surgery to ensure the bones heal properly. Your health care  provider will tell you what type of fracture you have and the best treatment for your condition. HOME CARE INSTRUCTIONS   Review correct crutch use with your health care provider and use your crutches as directed. Safe use of crutches is extremely important. Misuse of crutches can cause you to fall or cause injury to nerves in your hands or armpits.  Do not put weight or pressure on the injured ankle until directed by your health care provider.  To lessen  the swelling, keep the injured leg elevated while sitting or lying down.  Apply ice to the injured area:  Put ice in a plastic bag.  Place a towel between your cast and the bag.  Leave the ice on for 20 minutes, 2-3 times a day.  If you have a plaster or fiberglass cast:  Do not try to scratch the skin under the cast with any objects. This can increase your risk of skin infection.  Check the skin around the cast every day. You may put lotion on any red or sore areas.  Keep your cast dry and clean.  If you have a plaster splint:  Wear the splint as directed.  You may loosen the elastic around the splint if your toes become numb, tingle, or turn cold or blue.  Do not put pressure on any part of your cast or splint; it may break. Rest your cast only on a pillow the first 24 hours until it is fully hardened.  Your cast or splint can be protected during bathing with a plastic bag sealed to your skin with medical tape. Do not lower the cast or splint into water.  Take medicines as directed by your health care provider. Only take over-the-counter or prescription medicines for pain, discomfort, or fever as directed by your health care provider.  Do not drive a vehicle until your health care provider specifically tells you it is safe to do so.  If your health care provider has given you a follow-up appointment, it is very important to keep that appointment. Not keeping the appointment could result in a chronic or permanent injury, pain, and disability. If you have any problem keeping the appointment, call the facility for assistance. SEEK MEDICAL CARE IF: You develop increased swelling or discomfort. SEEK IMMEDIATE MEDICAL CARE IF:   Your cast gets damaged or breaks.  You have continued severe pain.  You develop new pain or swelling after the cast was put on.  Your skin or toenails below the injury turn blue or gray.  Your skin or toenails below the injury feel cold, numb, or have  loss of sensitivity to touch.  There is a bad smell or pus draining from under the cast. MAKE SURE YOU:   Understand these instructions.  Will watch your condition.  Will get help right away if you are not doing well or get worse.   This information is not intended to replace advice given to you by your health care provider. Make sure you discuss any questions you have with your health care provider.   Document Released: 11/17/2000 Document Revised: 11/25/2013 Document Reviewed: 06/19/2013 Elsevier Interactive Patient Education 2016 [place] or Splint Care Casts and splints support injured limbs and keep bones from moving while they heal. It is important to care for your cast or splint at home.  HOME CARE INSTRUCTIONS  Keep the cast or splint uncovered during the drying period. It can take 24 to 48 hours to dry if it  is made of plaster. A fiberglass cast will dry in less than 1 hour.  Do not rest the cast on anything harder than a pillow for the first 24 hours.  Do not put weight on your injured limb or apply pressure to the cast until your health care provider gives you permission.  Keep the cast or splint dry. Wet casts or splints can lose their shape and may not support the limb as well. A wet cast that has lost its shape can also create harmful pressure on your skin when it dries. Also, wet skin can become infected.  Cover the cast or splint with a plastic bag when bathing or when out in the rain or snow. If the cast is on the trunk of the body, take sponge baths until the cast is removed.  If your cast does become wet, dry it with a towel or a blow dryer on the cool setting only.  Keep your cast or splint clean. Soiled casts may be wiped with a moistened cloth.  Do not place any hard or soft foreign objects under your cast or splint, such as cotton, toilet paper, lotion, or powder.  Do not try to scratch the skin under the cast with any object. The object could  get stuck inside the cast. Also, scratching could lead to an infection. If itching is a problem, use a blow dryer on a cool setting to relieve discomfort.  Do not trim or cut your cast or remove padding from inside of it.  Exercise all joints next to the injury that are not immobilized by the cast or splint. For example, if you have a long leg cast, exercise the hip joint and toes. If you have an arm cast or splint, exercise the shoulder, elbow, thumb, and fingers.  Elevate your injured arm or leg on 1 or 2 pillows for the first 1 to 3 days to decrease swelling and pain.It is best if you can comfortably elevate your cast so it is higher than your heart. SEEK MEDICAL CARE IF:   Your cast or splint cracks.  Your cast or splint is too tight or too loose.  You have unbearable itching inside the cast.  Your cast becomes wet or develops a soft spot or area.  You have a bad smell coming from inside your cast.  You get an object stuck under your cast.  Your skin around the cast becomes red or raw.  You have new pain or worsening pain after the cast has been applied. SEEK IMMEDIATE MEDICAL CARE IF:   You have fluid leaking through the cast.  You are unable to move your fingers or toes.  You have discolored (blue or white), cool, painful, or very swollen fingers or toes beyond the cast.  You have tingling or numbness around the injured area.  You have severe pain or pressure under the cast.  You have any difficulty with your breathing or have shortness of breath.  You have chest pain.   This information is not intended to replace advice given to you by your health care provider. Make sure you discuss any questions you have with your health care provider.   Document Released: 11/17/2000 Document Revised: 09/10/2013 Document Reviewed: 05/29/2013 Elsevier Interactive Patient Education [employer].

## 2015-12-27 NOTE — ED Notes (Signed)
Patient unable to void 

## 2015-12-27 NOTE — ED Notes (Signed)
Pt here with c/o right ankle and knee pain that began after a syncopal episode yesterday. Pt states she has been vomiting for a few days prior to the syncopal episode, states she had some crackers yesterday but can't drink enough to feel hydrated.

## 2016-01-12 ENCOUNTER — Emergency Department
Admission: EM | Admit: 2016-01-12 | Discharge: 2016-01-12 | Disposition: A | Discharge status: Home / Self Care | Payer: Medicaid Other | Attending: Emergency Medicine | Admitting: Emergency Medicine

## 2016-01-12 ENCOUNTER — Encounter: Payer: Self-pay | Admitting: Emergency Medicine

## 2016-01-12 DIAGNOSIS — Z79891 Long term (current) use of opiate analgesic: Secondary | ICD-10-CM | POA: Diagnosis not present

## 2016-01-12 DIAGNOSIS — J209 Acute bronchitis, unspecified: Secondary | ICD-10-CM | POA: Diagnosis not present

## 2016-01-12 DIAGNOSIS — Z79899 Other long term (current) drug therapy: Secondary | ICD-10-CM | POA: Diagnosis not present

## 2016-01-12 DIAGNOSIS — I10 Essential (primary) hypertension: Secondary | ICD-10-CM | POA: Diagnosis not present

## 2016-01-12 DIAGNOSIS — Z880 Allergy status to penicillin: Secondary | ICD-10-CM | POA: Diagnosis not present

## 2016-01-12 DIAGNOSIS — F17210 Nicotine dependence, cigarettes, uncomplicated: Secondary | ICD-10-CM | POA: Insufficient documentation

## 2016-01-12 DIAGNOSIS — R05 Cough: Secondary | ICD-10-CM | POA: Diagnosis present

## 2016-01-12 MED ORDER — PREDNISONE 10 MG PO TABS: ORAL_TABLET | ORAL | Status: DC

## 2016-01-12 MED ORDER — GUAIFENESIN-CODEINE 100-10 MG/5ML PO SOLN: 5.0000 mL | Freq: Four times a day (QID) | ORAL | Status: DC | PRN

## 2016-01-12 NOTE — ED Notes (Signed)
Developed cough about 4 days ago  Prod cough

## 2016-01-12 NOTE — Discharge Instructions (Signed)
Follow-up with [facility] if any continued problems. Increase fluids and stop smoking. Take prednisone beginning today along with Robitussin-AC. The aware that Robitussin-AC contains a narcotic which may cause drowsiness and therefore you should not be driving.

## 2016-01-12 NOTE — ED Provider Notes (Signed)
[facility] Emergency Department Provider Note  ____________________________________________  Time seen: Approximately 8:35 AM  I have reviewed the triage vital signs and the nursing notes.   HISTORY  Chief Complaint Cough    HPI Brenda Middleton is a 55 y.o. female is here with complaint of productive cough for approximately for 5 days. Patient states that she has felt feverish but has not actually taken her temperature. She states that she has been unable to smoke the last several days because of the coughing and also because the smoke increases her cough. Patient states she has been smoking since she was a teenager. She denies any history of asthma but states she has had bronchitis multiple times. She is taken some over-the-counter medication without any relief and took clindamycin for several days that she had left over from a dental abscess. She denies any fever or chills, there is no nausea or vomiting or diarrhea. Currently she goes to [facility] but was unable to be seen today. She rates her pain as a 0/10.   Past Medical History  Diagnosis Date  . Benign neoplasm of cervix uteri   . Anxiety   . Depressive disorder   . Displacement of cervical intervertebral disc   . Prolapsed cervical intervertebral disc   . Chronic hepatitis C without mention of hepatic coma   . Hypertensive disorder   . Constipation   . Palpitations   . Chronic low back pain 08/26/2014    Patient Active Problem List   Diagnosis Date Noted  . Chronic low back pain 08/26/2014    Past Surgical History  Procedure Laterality Date  . Tonsillectomy    . Conization cervix    . Dilation and curettage of uterus    . Vulvectomy    . Hand surgery  [id number]    Carpel Tunnel    Current Outpatient Rx  Name  Route  Sig  Dispense  Refill  . albuterol (PROVENTIL HFA;VENTOLIN HFA) 108 (90 BASE) MCG/ACT inhaler   Inhalation   Inhale 2 puffs into the lungs every 4 (four)  hours as needed for wheezing or shortness of breath.         Marland Kitchen amitriptyline (ELAVIL) 25 MG tablet   Oral   Take 2 tablets (50 mg total) by mouth at bedtime.   60 tablet   3   . clonazePAM (KLONOPIN) 0.5 MG tablet   Oral   Take 0.5 mg by mouth 2 (two) times daily.         . cyclobenzaprine (FLEXERIL) 10 MG tablet   Oral   Take 10 mg by mouth 3 (three) times daily.         Marland Kitchen guaiFENesin-codeine 100-10 MG/5ML syrup   Oral   Take 5 mLs by mouth every 6 (six) hours as needed for cough.   120 mL   0   . Linaclotide (LINZESS) 145 MCG CAPS capsule   Oral   Take 1 capsule by mouth daily.         Marland Kitchen lisinopril (PRINIVIL,ZESTRIL) 10 MG tablet   Oral   Take 10 mg by mouth daily.         Marland Kitchen morphine (MSIR) 15 MG tablet   Oral   Take 15 mg by mouth 3 (three) times daily.         . ondansetron (ZOFRAN ODT) 8 MG disintegrating tablet   Oral   Take 1 tablet (8 mg total) by mouth every 8 (eight) hours as  needed for nausea or vomiting.   20 tablet   0   . oxyCODONE-acetaminophen (ROXICET) 5-325 MG tablet   Oral   Take 1 tablet by mouth every 6 (six) hours as needed for severe pain.   12 tablet   0   . predniSONE (DELTASONE) 10 MG tablet      Take 6 tablets  today, on day 2 take 5 tablets, day 3 take 4 tablets, day 4 take 3 tablets, day 5 take  2 tablets and 1 tablet the last day   21 tablet   0   . Suvorexant (BELSOMRA) 20 MG TABS   Oral   Take 1 tablet by mouth daily.         . temazepam (RESTORIL) 30 MG capsule   Oral   Take 30 mg by mouth at bedtime.         . traMADol (ULTRAM) 50 MG tablet   Oral   Take 1 tablet (50 mg total) by mouth every 6 (six) hours as needed.   90 tablet   2     Must last 28 days   . Umeclidinium-Vilanterol (ANORO ELLIPTA) 62.5-25 MCG/INH AEPB   Inhalation   Inhale 1 puff into the lungs daily.           Allergies Penicillin g; Penicillin v; and Penicillins  Family History  Problem Relation Age of Onset  . Cancer     . Stroke    . Breast cancer Mother   . Lung cancer Father   . Cancer Brother     Social History Social History  Substance Use Topics  . Smoking status: Current Every Day Smoker -- 0.70 packs/day for 35 years    Types: Cigarettes  . Smokeless tobacco: Never Used  . Alcohol Use: Yes     Comment: occasional wine    Review of Systems Constitutional: No fever/chills ENT: No sore throat. Positive nasal congestion. No ear pain. Cardiovascular: Denies chest pain. Respiratory: Denies shortness of breath. Positive productive cough. Gastrointestinal:   No nausea, no vomiting.  No diarrhea.  Musculoskeletal: Negative for back pain. Skin: Negative for rash. Neurological: Negative for headaches, focal weakness or numbness.  10-point ROS otherwise negative.  ____________________________________________   PHYSICAL EXAM:  VITAL SIGNS: ED Triage Vitals  Enc Vitals Group     BP 01/12/16 0820 107/82 mmHg     Pulse Rate 01/12/16 0820 119     Resp 01/12/16 0820 18     Temp 01/12/16 0820 98.2 F (36.8 C)     Temp Source 01/12/16 0820 Oral     SpO2 01/12/16 0820 97 %     Weight 01/12/16 0820 147 lb (66.679 kg)     Height 01/12/16 0820 '5\' 6"'$  (1.676 m)     Head Cir --      Peak Flow --      Pain Score 01/12/16 0829 0     Pain Loc --      Pain Edu? --      Excl. in [place]? --     Constitutional: Alert and oriented. Well appearing and in no acute distress. Eyes: Conjunctivae are normal. PERRL. EOMI. Head: Atraumatic. Nose: Mild congestion/rhinnorhea.  EACs are clear, TMs are dull bilaterally. Mouth/Throat: Mucous membranes are moist.  Oropharynx non-erythematous. Posterior drainage present. Neck: No stridor.   Hematological/Lymphatic/Immunilogical: No cervical lymphadenopathy. Cardiovascular: Normal rate, regular rhythm. Grossly normal heart sounds.  Good peripheral circulation. Respiratory: Normal respiratory effort.  No retractions. Lungs CTAB. Gastrointestinal: Soft and  nontender.  No distention. Musculoskeletal: His extremities upper and lower without any difficulty and normal gait was noted. Neurologic:  Normal speech and language. No gross focal neurologic deficits are appreciated. No gait instability. Skin:  Skin is warm, dry and intact. No rash noted. Psychiatric: Mood and affect are normal. Speech and behavior are normal.  ____________________________________________   LABS (all labs ordered are listed, but only abnormal results are displayed)  Labs Reviewed - No data to display  PROCEDURES  Procedure(s) performed: None  Critical Care performed: No  ____________________________________________   INITIAL IMPRESSION / ASSESSMENT AND PLAN / ED COURSE  Pertinent labs & imaging results that were available during my care of the patient were reviewed by me and considered in my medical decision making (see chart for details).  Patient is given prescription for prednisone 60 mg 6 day taper along with Robitussin-AC as needed for cough. Patient is to continue with Tylenol if needed for fever and to increase fluid she was encouraged to stop smoking. ____________________________________________   FINAL CLINICAL IMPRESSION(S) / ED DIAGNOSES  Final diagnoses:  Cigarette smoker  Acute bronchitis, unspecified organism      Johnn Hai, PA-C 01/12/16 1234  Harvest Dark, MD 01/12/16 2220

## 2016-07-20 ENCOUNTER — Other Ambulatory Visit: Payer: Self-pay | Admitting: Family Medicine

## 2016-07-20 DIAGNOSIS — Z1231 Encounter for screening mammogram for malignant neoplasm of breast: Secondary | ICD-10-CM

## 2016-07-24 ENCOUNTER — Inpatient Hospital Stay
Admission: RE | Admit: 2016-07-24 | Discharge: 2016-07-24 | Disposition: A | Discharge status: Home / Self Care | Payer: Self-pay | Source: Ambulatory Visit | Attending: *Deleted | Admitting: *Deleted

## 2016-07-24 ENCOUNTER — Other Ambulatory Visit: Payer: Self-pay | Admitting: *Deleted

## 2016-07-24 DIAGNOSIS — Z9289 Personal history of other medical treatment: Secondary | ICD-10-CM

## 2016-08-21 ENCOUNTER — Ambulatory Visit
Admission: RE | Admit: 2016-08-21 | Discharge: 2016-08-21 | Disposition: A | Discharge status: Home / Self Care | Payer: Medicaid Other | Source: Ambulatory Visit | Attending: Family Medicine | Admitting: Family Medicine

## 2016-08-21 ENCOUNTER — Other Ambulatory Visit: Payer: Self-pay | Admitting: Family Medicine

## 2016-08-21 DIAGNOSIS — Z1231 Encounter for screening mammogram for malignant neoplasm of breast: Secondary | ICD-10-CM

## 2016-09-04 ENCOUNTER — Other Ambulatory Visit: Payer: Medicaid Other

## 2016-09-04 ENCOUNTER — Ambulatory Visit: Payer: Medicaid Other

## 2016-09-06 ENCOUNTER — Other Ambulatory Visit: Payer: Medicaid Other

## 2016-09-06 ENCOUNTER — Ambulatory Visit: Payer: Medicaid Other

## 2016-09-26 ENCOUNTER — Other Ambulatory Visit: Payer: Medicaid Other

## 2016-09-26 ENCOUNTER — Ambulatory Visit: Payer: Medicaid Other

## 2016-10-10 ENCOUNTER — Ambulatory Visit: Payer: Medicaid Other

## 2016-10-10 ENCOUNTER — Other Ambulatory Visit: Payer: Medicaid Other

## 2016-10-24 ENCOUNTER — Ambulatory Visit: Payer: Medicaid Other

## 2016-10-24 ENCOUNTER — Other Ambulatory Visit: Payer: Medicaid Other

## 2016-11-21 ENCOUNTER — Other Ambulatory Visit: Payer: Medicaid Other

## 2016-11-21 ENCOUNTER — Ambulatory Visit: Payer: Medicaid Other

## 2016-11-21 ENCOUNTER — Ambulatory Visit: Payer: Medicaid Other | Admitting: Family Medicine

## 2016-12-13 ENCOUNTER — Other Ambulatory Visit: Payer: Medicaid Other

## 2016-12-13 ENCOUNTER — Ambulatory Visit: Payer: Medicaid Other

## 2017-01-15 ENCOUNTER — Ambulatory Visit: Payer: Medicaid Other

## 2017-01-15 ENCOUNTER — Other Ambulatory Visit: Payer: Medicaid Other

## 2017-02-12 ENCOUNTER — Ambulatory Visit: Payer: Medicaid Other

## 2017-02-12 ENCOUNTER — Other Ambulatory Visit: Payer: Medicaid Other

## 2017-02-26 ENCOUNTER — Other Ambulatory Visit: Payer: Medicaid Other

## 2017-02-26 ENCOUNTER — Ambulatory Visit: Payer: Medicaid Other

## 2017-03-20 ENCOUNTER — Other Ambulatory Visit: Payer: Medicaid Other

## 2017-03-20 ENCOUNTER — Ambulatory Visit: Payer: Medicaid Other

## 2017-04-03 ENCOUNTER — Ambulatory Visit
Admission: RE | Admit: 2017-04-03 | Discharge: 2017-04-03 | Disposition: A | Discharge status: Home / Self Care | Payer: Medicaid Other | Source: Ambulatory Visit | Attending: Family Medicine | Admitting: Family Medicine

## 2017-04-03 ENCOUNTER — Other Ambulatory Visit: Payer: Self-pay | Admitting: Family Medicine

## 2017-04-03 DIAGNOSIS — Z1231 Encounter for screening mammogram for malignant neoplasm of breast: Secondary | ICD-10-CM

## 2017-04-27 ENCOUNTER — Ambulatory Visit
Admission: EM | Admit: 2017-04-27 | Discharge: 2017-04-27 | Disposition: A | Discharge status: Home / Self Care | Payer: Medicaid Other | Attending: Internal Medicine | Admitting: Internal Medicine

## 2017-04-27 DIAGNOSIS — L0201 Cutaneous abscess of face: Secondary | ICD-10-CM

## 2017-04-27 MED ORDER — SULFAMETHOXAZOLE-TRIMETHOPRIM 800-160 MG PO TABS: 1.0000 | ORAL_TABLET | Freq: Two times a day (BID) | ORAL | 0 refills | Status: AC

## 2017-04-27 MED ORDER — HYDROCODONE-ACETAMINOPHEN 5-325 MG PO TABS
1.0000 | ORAL_TABLET | Freq: Four times a day (QID) | ORAL | 0 refills | Status: DC | PRN
Start: 2017-04-27 — End: 2017-04-27

## 2017-04-27 NOTE — ED Provider Notes (Signed)
CSN: [id number]     Arrival date & time 04/27/17  1558 History   First MD Initiated Contact with Patient 04/27/17 1639     Chief Complaint  Patient presents with  . Insect Bite   (Consider location/radiation/quality/duration/timing/severity/associated sxs/prior Treatment) Patient presents with complaint of a possible bug bite to her left cheek. Patient states this Has been there but not the larger abscess and pain. She states that the abscess was there after she woke up this morning. Patient reports pain and tenderness to the site. Patient denies any recent dental work and does not remember a recent insect bite. Patient denies any drainage and states she is not taking any medications for it.      Past Medical History:  Diagnosis Date  . Anxiety   . Benign neoplasm of cervix uteri   . Chronic hepatitis C without mention of hepatic coma   . Chronic low back pain 08/26/2014  . Constipation   . Depressive disorder   . Displacement of cervical intervertebral disc   . Hypertensive disorder   . Palpitations   . Prolapsed cervical intervertebral disc    Past Surgical History:  Procedure Laterality Date  . CONIZATION CERVIX    . DILATION AND CURETTAGE OF UTERUS    . HAND SURGERY  2008,2015   Carpel Tunnel  . TONSILLECTOMY    . VULVECTOMY     Family History  Problem Relation Age of Onset  . Breast cancer Mother 42  . Lung cancer Father   . Cancer Brother   . Cancer Unknown   . Stroke Unknown    Social History  Substance Use Topics  . Smoking status: Current Every Day Smoker    Packs/day: 0.70    Years: 35.00    Types: Cigarettes  . Smokeless tobacco: Never Used  . Alcohol use Yes     Comment: occasional wine   OB History    No data available     Review of Systems  Constitutional: Negative.  Negative for fever.  HENT:       Area of swelling, pain and redness to her left cheek.  Eyes: Negative.   All other systems reviewed and are negative.   Allergies    Penicillin g; Penicillin v; and Penicillins  Home Medications   Prior to Admission medications   Medication Sig Start Date End Date Taking? Authorizing Provider  albuterol (PROVENTIL HFA;VENTOLIN HFA) 108 (90 BASE) MCG/ACT inhaler Inhale 2 puffs into the lungs every 4 (four) hours as needed for wheezing or shortness of breath.   Yes [provider]  budesonide-formoterol (SYMBICORT) 80-4.5 MCG/ACT inhaler Inhale 2 puffs into the lungs 2 (two) times daily.   Yes [provider]  cyclobenzaprine (FLEXERIL) 10 MG tablet Take 10 mg by mouth 3 (three) times daily.   Yes [provider]  Linaclotide (LINZESS) 145 MCG CAPS capsule Take 1 capsule by mouth daily.   Yes [provider]  lisinopril (PRINIVIL,ZESTRIL) 10 MG tablet Take 10 mg by mouth daily.   Yes [provider]  methadone (DOLOPHINE) 10 MG tablet Take 10 mg by mouth every 8 (eight) hours.   Yes [provider]  prochlorperazine (COMPAZINE) 10 MG tablet Take 10 mg by mouth every 6 (six) hours as needed for nausea or vomiting.   Yes [provider]  amitriptyline (ELAVIL) 25 MG tablet Take 2 tablets (50 mg total) by mouth at bedtime. 08/26/14   Kathrynn Ducking, MD  clonazePAM (KLONOPIN) 0.5 MG tablet  Take 0.5 mg by mouth 2 (two) times daily.    [provider]  guaiFENesin-codeine 100-10 MG/5ML syrup Take 5 mLs by mouth every 6 (six) hours as needed for cough. 01/12/16   Johnn Hai, PA-C  morphine (MSIR) 15 MG tablet Take 15 mg by mouth 3 (three) times daily.    [provider]  ondansetron (ZOFRAN ODT) 8 MG disintegrating tablet Take 1 tablet (8 mg total) by mouth every 8 (eight) hours as needed for nausea or vomiting. 12/27/15   Carrie Mew, MD  oxyCODONE-acetaminophen (ROXICET) 5-325 MG tablet Take 1 tablet by mouth every 6 (six) hours as needed for severe pain. 12/27/15   Carrie Mew, MD  predniSONE (DELTASONE) 10 MG tablet Take 6 tablets   today, on day 2 take 5 tablets, day 3 take 4 tablets, day 4 take 3 tablets, day 5 take  2 tablets and 1 tablet the last day 01/12/16   Johnn Hai, PA-C  sulfamethoxazole-trimethoprim (BACTRIM DS,SEPTRA DS) 800-160 MG tablet Take 1 tablet by mouth 2 (two) times daily. 04/27/17 05/04/17  Luvenia Redden, PA-C  Suvorexant (BELSOMRA) 20 MG TABS Take 1 tablet by mouth daily.    [provider]  temazepam (RESTORIL) 30 MG capsule Take 30 mg by mouth at bedtime.    [provider]  traMADol (ULTRAM) 50 MG tablet Take 1 tablet (50 mg total) by mouth every 6 (six) hours as needed. 08/26/14   Kathrynn Ducking, MD  Umeclidinium-Vilanterol [facility] ELLIPTA) 62.5-25 MCG/INH AEPB Inhale 1 puff into the lungs daily.    [provider]   Meds Ordered and Administered this Visit  Medications - No data to display  BP 125/79 (BP Location: Left Arm)   Pulse (!) 107   Temp 98.7 F (37.1 C) (Oral)   Resp 16   Ht 5\' 6"  (1.676 m)   Wt 140 lb (63.5 kg)   SpO2 97%   BMI 22.60 kg/m  No data found.   Physical Exam  HENT:  Head: Normocephalic and atraumatic.    Mouth/Throat: Uvula is midline, oropharynx is clear and moist and mucous membranes are normal. No posterior oropharyngeal edema or posterior oropharyngeal erythema.  No redness or ulcerations to inside of mouth. No obvious dental infections.    Urgent Care Course     Procedures (including critical care time)  Labs Review Labs Reviewed - No data to display  Imaging Review No results found.      MDM   1. Facial abscess    Discharge Medication List as of 04/27/2017  4:50 PM    START taking these medications   Details  sulfamethoxazole-trimethoprim (BACTRIM DS,SEPTRA DS) 800-160 MG tablet Take 1 tablet by mouth 2 (two) times daily., Starting Fri 04/27/2017, Until Fri 05/04/2017, Normal    Patient with approximately 1-1/2 cm round abscess on her left medial cheek that popped up this morning. There is a small  scab related but no obvious pus pocket noted. Some redness, induration, tender to touch. Give patient a prescription for Bactrim for 7 days. Patient is on methadone managed by pain clinicso will not give her any narcotic pain medications. Also recommend warm compresses and warm salt water gargles. Patient to return to PCP or this clinic should her symptoms worsen or not improved. Patient verbalized understanding is in agreement with plan.  Luvenia Redden, PA-C    Luvenia Redden, PA-C 04/27/17 [phone]

## 2017-04-27 NOTE — Discharge Instructions (Addendum)
-  Bactrim: 1 tablet twice a day for 7 days. Complete the course. -Can take over-the-counter Tylenol and ibuprofen for pain -Possibly warm compresses as well as warm salt water gargle to help with pain. -Can return to clinic should symptoms worsen or not improve.

## 2017-04-27 NOTE — ED Triage Notes (Signed)
Patient states that this morning she woke up with pain and swelling in left side of her jaw from a possible bite or boil. Patient states that are has been painful. No drainage from area.

## 2017-04-29 ENCOUNTER — Emergency Department
Admission: EM | Admit: 2017-04-29 | Discharge: 2017-04-29 | Disposition: A | Discharge status: Home / Self Care | Payer: Medicaid Other | Attending: Emergency Medicine | Admitting: Emergency Medicine

## 2017-04-29 ENCOUNTER — Encounter: Payer: Self-pay | Admitting: Emergency Medicine

## 2017-04-29 DIAGNOSIS — L723 Sebaceous cyst: Secondary | ICD-10-CM | POA: Diagnosis not present

## 2017-04-29 DIAGNOSIS — F17210 Nicotine dependence, cigarettes, uncomplicated: Secondary | ICD-10-CM | POA: Diagnosis not present

## 2017-04-29 DIAGNOSIS — Z79899 Other long term (current) drug therapy: Secondary | ICD-10-CM | POA: Insufficient documentation

## 2017-04-29 DIAGNOSIS — R220 Localized swelling, mass and lump, head: Secondary | ICD-10-CM | POA: Diagnosis present

## 2017-04-29 DIAGNOSIS — L089 Local infection of the skin and subcutaneous tissue, unspecified: Secondary | ICD-10-CM

## 2017-04-29 MED ORDER — LIDOCAINE HCL (PF) 1 % IJ SOLN
5.0000 mL | Freq: Once | INTRAMUSCULAR | Status: AC
  Administered 2017-04-29: 5 mL
  Filled 2017-04-29: qty 5

## 2017-04-29 NOTE — Discharge Instructions (Signed)
Keep wound clean and dry. Continue antibiotic prescribed at urgent care. Wound re-check in 2 days.

## 2017-04-29 NOTE — ED Triage Notes (Signed)
Pt seen at [facility] urgent care on Friday and placed on bactrim. States swelling increased.

## 2017-04-29 NOTE — ED Provider Notes (Signed)
[facility] Emergency Department Provider Note   ____________________________________________   I have reviewed the triage vital signs and the nursing notes.   HISTORY  Chief Complaint Facial Swelling    HPI Brenda Middleton is a 56 y.o. female presented with area of induration, erythema along the left lower jaw. Patient initially seen at [facility] urgent care and treated for abscess. Patient prescribed Bactrim. Since Friday the area had become severely more swollen and painful. Denies noting any drainage or swelling into the mouth or gumline area. Patient denies any recent dental work or upper respiratory illnesses. Patient denies fever, chills, headache, vision changes, chest pain, chest tightness, shortness of breath, abdominal pain, nausea and vomiting.  Past Medical History:  Diagnosis Date  . Anxiety   . Benign neoplasm of cervix uteri   . Chronic hepatitis C without mention of hepatic coma   . Chronic low back pain 08/26/2014  . Constipation   . Depressive disorder   . Displacement of cervical intervertebral disc   . Hypertensive disorder   . Palpitations   . Prolapsed cervical intervertebral disc     Patient Active Problem List   Diagnosis Date Noted  . Chronic low back pain 08/26/2014    Past Surgical History:  Procedure Laterality Date  . CONIZATION CERVIX    . DILATION AND CURETTAGE OF UTERUS    . HAND SURGERY  2008,2015   Carpel Tunnel  . TONSILLECTOMY    . VULVECTOMY      Prior to Admission medications   Medication Sig Start Date End Date Taking? Authorizing Provider  albuterol (PROVENTIL HFA;VENTOLIN HFA) 108 (90 BASE) MCG/ACT inhaler Inhale 2 puffs into the lungs every 4 (four) hours as needed for wheezing or shortness of breath.    [provider]  amitriptyline (ELAVIL) 25 MG tablet Take 2 tablets (50 mg total) by mouth at bedtime. 08/26/14   Kathrynn Ducking, MD  budesonide-formoterol [facility]) 80-4.5 MCG/ACT  inhaler Inhale 2 puffs into the lungs 2 (two) times daily.    [provider]  clonazePAM (KLONOPIN) 0.5 MG tablet Take 0.5 mg by mouth 2 (two) times daily.    [provider]  cyclobenzaprine (FLEXERIL) 10 MG tablet Take 10 mg by mouth 3 (three) times daily.    [provider]  guaiFENesin-codeine 100-10 MG/5ML syrup Take 5 mLs by mouth every 6 (six) hours as needed for cough. 01/12/16   Johnn Hai, PA-C  Linaclotide [facility]) 145 MCG CAPS capsule Take 1 capsule by mouth daily.    [provider]  lisinopril (PRINIVIL,ZESTRIL) 10 MG tablet Take 10 mg by mouth daily.    [provider]  methadone (DOLOPHINE) 10 MG tablet Take 10 mg by mouth every 8 (eight) hours.    [provider]  morphine (MSIR) 15 MG tablet Take 15 mg by mouth 3 (three) times daily.    [provider]  ondansetron (ZOFRAN ODT) 8 MG disintegrating tablet Take 1 tablet (8 mg total) by mouth every 8 (eight) hours as needed for nausea or vomiting. 12/27/15   Carrie Mew, MD  oxyCODONE-acetaminophen (ROXICET) 5-325 MG tablet Take 1 tablet by mouth every 6 (six) hours as needed for severe pain. 12/27/15   Carrie Mew, MD  predniSONE (DELTASONE) 10 MG tablet Take 6 tablets  today, on day 2 take 5 tablets, day 3 take 4 tablets, day 4 take 3 tablets, day 5 take  2 tablets and 1 tablet the last day 01/12/16  Johnn Hai, PA-C  prochlorperazine (COMPAZINE) 10 MG tablet Take 10 mg by mouth every 6 (six) hours as needed for nausea or vomiting.    [provider]  sulfamethoxazole-trimethoprim (BACTRIM DS,SEPTRA DS) 800-160 MG tablet Take 1 tablet by mouth 2 (two) times daily. 04/27/17 05/04/17  Luvenia Redden, PA-C  Suvorexant (BELSOMRA) 20 MG TABS Take 1 tablet by mouth daily.    [provider]  temazepam (RESTORIL) 30 MG capsule Take 30 mg by mouth at bedtime.    [provider]  traMADol (ULTRAM) 50 MG tablet Take 1 tablet (50 mg  total) by mouth every 6 (six) hours as needed. 08/26/14   Kathrynn Ducking, MD  Umeclidinium-Vilanterol [facility] ELLIPTA) 62.5-25 MCG/INH AEPB Inhale 1 puff into the lungs daily.    [provider]    Allergies Penicillin g; Penicillin v; and Penicillins  Family History  Problem Relation Age of Onset  . Breast cancer Mother 66  . Lung cancer Father   . Cancer Brother   . Cancer Unknown   . Stroke Unknown     Social History Social History  Substance Use Topics  . Smoking status: Current Every Day Smoker    Packs/day: 0.70    Years: 35.00    Types: Cigarettes  . Smokeless tobacco: Never Used  . Alcohol use Yes     Comment: occasional wine    Review of Systems Constitutional: Negative for fever/chills Eyes: No visual changes. ENT:  Negative for sore throat. Negative for difficulty swallowing Cardiovascular: Denies chest pain. Respiratory: Denies cough Denies shortness of breath. Skin: Negative for rash.Left lower jaw swelling, erythema, pain ____________________________________________   PHYSICAL EXAM:  VITAL SIGNS: ED Triage Vitals [04/29/17 1355]  Enc Vitals Group     BP 122/86     Pulse Rate (!) 105     Resp 16     Temp 98.5 F (36.9 C)     Temp src      SpO2 95 %     Weight 140 lb (63.5 kg)     Height 5\' 6"  (1.676 m)     Head Circumference      Peak Flow      Pain Score 8     Pain Loc      Pain Edu?      Excl. in [place]?     Constitutional: Alert and oriented. Well appearing and in no acute distress.  Head: Normocephalic and atraumatic. Eyes: Conjunctivae are normal.  Nose: No congestion/rhinorrhea/epistaxis.  Mouth/Throat: Mucous membranes are moist.  Neck: Supple.  Cardiovascular: Normal rate, regular rhythm. Normal distal pulses. Respiratory: Normal respiratory effort.  Gastrointestinal: Soft and nontender.  Musculoskeletal: Nontender with normal range of motion in all extremities. Skin:  Skin is warm, dry and intact. Except left lower jaw:  Area of induration with small area of fluctuance noted, erythematous. No drainage noted. Psychiatric: Mood and affect are normal.  ____________________________________________   LABS (all labs ordered are listed, but only abnormal results are displayed)  Labs Reviewed - No data to display ____________________________________________  EKG none ____________________________________________  RADIOLOGY none ____________________________________________   PROCEDURES  Procedure(s) performed: INCISION AND DRAINAGE Performed by: Jerolyn Shin Consent: Verbal consent obtained. Risks and benefits: risks, benefits and alternatives were discussed Type: infected sebaceous cyst  Body area: left lower jaw  Anesthesia: local infiltration  Incision was made with a scalpel.  Local anesthetic: lidocaine 1%  Anesthetic total: 3.0 ml  Complexity: complex Blunt dissection to break up loculations  Drainage: thick, purlent  Drainage amount: 5.0 ml  Packing material: 1/4 in iodoform gauze  Patient tolerance: Patient tolerated the procedure well with no immediate complications.     Critical Care performed: no ____________________________________________   INITIAL IMPRESSION / ASSESSMENT AND PLAN / ED COURSE  Pertinent labs & imaging results that were available during my care of the patient were reviewed by me and considered in my medical decision making (see chart for details).  Patient presented with likely infected sebaceous cyst along the left lower jaw. Patient initially treated at [facility] urgent care with Bactrim however symptoms worsened. An area of fluctuance noted therefore incised and drained. Patient tolerated procedure without occasions and will resume the anabolic regimen she received at [facility] urgent care. Patient will return in 2 days for wound recheck and discussed if she noted any signs of symptoms worsening to return prior to the emergency department prior to the  wound recheck. Patient informed of clinical course, understand medical decision-making process, and agree with plan.      ____________________________________________   FINAL CLINICAL IMPRESSION(S) / ED DIAGNOSES  Final diagnoses:  Left facial swelling  Infected sebaceous cyst       NEW MEDICATIONS STARTED DURING THIS VISIT:  Discharge Medication List as of 04/29/2017  3:33 PM       Note:  This document was prepared using Dragon voice recognition software and may include unintentional dictation errors.   Yer Olivencia, Fleet Contras 04/29/17 2113    Nance Pear, MD 05/06/17 [phone]

## 2017-05-01 ENCOUNTER — Emergency Department
Admission: EM | Admit: 2017-05-01 | Discharge: 2017-05-01 | Disposition: A | Discharge status: Home / Self Care | Payer: Medicaid Other | Attending: Emergency Medicine | Admitting: Emergency Medicine

## 2017-05-01 ENCOUNTER — Encounter: Payer: Self-pay | Admitting: Emergency Medicine

## 2017-05-01 DIAGNOSIS — Z8541 Personal history of malignant neoplasm of cervix uteri: Secondary | ICD-10-CM | POA: Insufficient documentation

## 2017-05-01 DIAGNOSIS — I10 Essential (primary) hypertension: Secondary | ICD-10-CM | POA: Insufficient documentation

## 2017-05-01 DIAGNOSIS — F17210 Nicotine dependence, cigarettes, uncomplicated: Secondary | ICD-10-CM | POA: Diagnosis not present

## 2017-05-01 DIAGNOSIS — Z5189 Encounter for other specified aftercare: Secondary | ICD-10-CM

## 2017-05-01 DIAGNOSIS — Z4801 Encounter for change or removal of surgical wound dressing: Secondary | ICD-10-CM | POA: Insufficient documentation

## 2017-05-01 MED ORDER — HYDROMORPHONE HCL 1 MG/ML IJ SOLN
1.0000 mg | Freq: Once | INTRAMUSCULAR | Status: AC
  Administered 2017-05-01: 1 mg via INTRAMUSCULAR
  Filled 2017-05-01: qty 1

## 2017-05-01 MED ORDER — MUPIROCIN 2 % EX OINT: TOPICAL_OINTMENT | CUTANEOUS | 0 refills | Status: AC

## 2017-05-01 NOTE — ED Triage Notes (Signed)
Seen on Saturday for I+D of left cheek abscess.  Here today for recheck.  Patient states swelling seems to have worsened since Saturday.  Patient states she is taking Bactrim as prescribed.  Abscess continues to drain purulent drainage.  Patient states drainage is new today.

## 2017-05-01 NOTE — ED Provider Notes (Signed)
[facility] Emergency Department Provider Note       Time seen: ----------------------------------------- 8:12 AM on 05/01/2017 -----------------------------------------     I have reviewed the triage vital signs and the nursing notes.   HISTORY   Chief Complaint Wound Check    HPI Brenda Middleton is a 56 y.o. female who presents to the ED for recheck of a left cheek abscess. Patient was seen on Saturday and had an incision and drainage of the left cheek. She is here today for recheck, states swelling seems to have worsened low she's taking the antibiotic as prescribed. Abscess continues to drain purulent drainage.   Past Medical History:  Diagnosis Date  . Anxiety   . Benign neoplasm of cervix uteri   . Chronic hepatitis C without mention of hepatic coma   . Chronic low back pain 08/26/2014  . Constipation   . Depressive disorder   . Displacement of cervical intervertebral disc   . Hypertensive disorder   . Palpitations   . Prolapsed cervical intervertebral disc     Patient Active Problem List   Diagnosis Date Noted  . Chronic low back pain 08/26/2014    Past Surgical History:  Procedure Laterality Date  . CONIZATION CERVIX    . DILATION AND CURETTAGE OF UTERUS    . HAND SURGERY  2008,2015   Carpel Tunnel  . TONSILLECTOMY    . VULVECTOMY      Allergies Penicillin g; Penicillin v; and Penicillins  Social History Social History  Substance Use Topics  . Smoking status: Current Every Day Smoker    Packs/day: 0.70    Years: 35.00    Types: Cigarettes  . Smokeless tobacco: Never Used  . Alcohol use Yes     Comment: occasional wine    Review of Systems Constitutional: Negative for fever. Musculoskeletal: Positive for left-sided facial pain Skin:Positive for skin abscess Neurological: Negative for headaches, focal weakness or numbness.  All systems negative/normal/unremarkable except as stated in the  HPI  ____________________________________________   PHYSICAL EXAM:  VITAL SIGNS: ED Triage Vitals  Enc Vitals Group     BP 05/01/17 0806 120/69     Pulse Rate 05/01/17 0806 (!) 102     Resp 05/01/17 0806 18     Temp 05/01/17 0806 97.8 F (36.6 C)     Temp Source 05/01/17 0806 Oral     SpO2 05/01/17 0806 97 %     Weight 05/01/17 0803 140 lb (63.5 kg)     Height 05/01/17 0803 5\' 6"  (1.676 m)     Head Circumference --      Peak Flow --      Pain Score 05/01/17 0803 9     Pain Loc --      Pain Edu? --      Excl. in [place]? --    Constitutional: Alert and oriented. Well appearing and in no distress. Eyes: Conjunctivae are normal. Normal extraocular movements. ENT   Head: Normocephalic and atraumatic.Facial abscess is noted laterally on the left   Nose: No congestion/rhinnorhea.   Mouth/Throat: Mucous membranes are moist.   Neck: No stridor. Musculoskeletal: Nontender with normal range of motion in extremities. No lower extremity tenderness nor edema. Neurologic:  Normal speech and language. No gross focal neurologic deficits are appreciated.  Skin: Facial abscess inferior laterally on the left face. There is still some surrounding erythema and packing is in place. Psychiatric: Mood and affect are normal. Speech and behavior are normal.  ___________________________________________  ED COURSE:  Pertinent labs & imaging results that were available during my care of the patient were reviewed by me and considered in my medical decision making (see chart for details). Patient presents for left-sided facial abscess, I removed the packing and expressed the remaining purulent material. She will be given topical antibiotic ointment, encouraged to continue taking Bactrim. No further treatment today is necessary   Procedures ____________________________________________  FINAL ASSESSMENT AND PLAN  Wound check  Plan: Patient had presented for a wound recheck. Abscess is now  draining better without the packing in place. I will encourage warm compresses, continued antibiotics and topical antibiotic ointment.   Earleen Newport, MD   Note: This note was generated in part or whole with voice recognition software. Voice recognition is usually quite accurate but there are transcription errors that can and very often do occur. I apologize for any typographical errors that were not detected and corrected.     Earleen Newport, MD 05/01/17 [phone]

## 2017-05-01 NOTE — ED Notes (Signed)
Draining abscess noted on left cheek. Draining purulent fluid. Patient reports drainage, swelling, and pain worse since having abscess drained initially on Saturday. Denies fever. Reports compliance with antibiotics.

## 2018-07-28 ENCOUNTER — Encounter: Payer: Self-pay | Admitting: *Deleted

## 2018-07-28 ENCOUNTER — Emergency Department: Payer: Medicaid Other

## 2018-07-28 ENCOUNTER — Emergency Department
Admission: EM | Admit: 2018-07-28 | Discharge: 2018-07-28 | Disposition: A | Discharge status: Home / Self Care | Payer: Medicaid Other | Attending: Emergency Medicine | Admitting: Emergency Medicine

## 2018-07-28 ENCOUNTER — Other Ambulatory Visit: Payer: Self-pay

## 2018-07-28 DIAGNOSIS — L03113 Cellulitis of right upper limb: Secondary | ICD-10-CM | POA: Diagnosis not present

## 2018-07-28 DIAGNOSIS — F17210 Nicotine dependence, cigarettes, uncomplicated: Secondary | ICD-10-CM | POA: Insufficient documentation

## 2018-07-28 DIAGNOSIS — M25521 Pain in right elbow: Secondary | ICD-10-CM | POA: Diagnosis present

## 2018-07-28 DIAGNOSIS — R05 Cough: Secondary | ICD-10-CM | POA: Insufficient documentation

## 2018-07-28 DIAGNOSIS — I10 Essential (primary) hypertension: Secondary | ICD-10-CM | POA: Diagnosis not present

## 2018-07-28 DIAGNOSIS — Z79899 Other long term (current) drug therapy: Secondary | ICD-10-CM | POA: Insufficient documentation

## 2018-07-28 MED ORDER — CEPHALEXIN 500 MG PO CAPS
500.0000 mg | ORAL_CAPSULE | Freq: Once | ORAL | Status: AC
  Administered 2018-07-28: 500 mg via ORAL
  Filled 2018-07-28: qty 1

## 2018-07-28 MED ORDER — SULFAMETHOXAZOLE-TRIMETHOPRIM 800-160 MG PO TABS
1.0000 | ORAL_TABLET | Freq: Once | ORAL | Status: AC
Start: 2018-07-28 — End: 2018-07-28
  Administered 2018-07-28: 1 via ORAL
  Filled 2018-07-28: qty 1

## 2018-07-28 MED ORDER — CEPHALEXIN 500 MG PO CAPS: 500.0000 mg | ORAL_CAPSULE | Freq: Two times a day (BID) | ORAL | 0 refills | Status: DC

## 2018-07-28 MED ORDER — IPRATROPIUM-ALBUTEROL 0.5-2.5 (3) MG/3ML IN SOLN
3.0000 mL | Freq: Once | RESPIRATORY_TRACT | Status: AC
  Administered 2018-07-28: 3 mL via RESPIRATORY_TRACT
  Filled 2018-07-28: qty 3

## 2018-07-28 MED ORDER — SULFAMETHOXAZOLE-TRIMETHOPRIM 800-160 MG PO TABS: 1.0000 | ORAL_TABLET | Freq: Two times a day (BID) | ORAL | 0 refills | Status: DC

## 2018-07-28 NOTE — ED Notes (Signed)
Patient transported to X-ray 

## 2018-07-28 NOTE — ED Notes (Signed)
Pt states that her right arm is swollen and painful during the last couple of days. PT states she took a scab off her arm and might have gotten some chemicals in it as well. Pt states the fumes from the chemicals she was mixing might have attributed to her cough.

## 2018-07-28 NOTE — ED Triage Notes (Signed)
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 Pt also c/o R elbow pain and swelling, wound present there, unknown when it was initially injured. Pt states that the scab came off while she was cleaning 3 days ago and the wound has gotten more painful, red, swollen and tender.

## 2018-07-28 NOTE — ED Provider Notes (Signed)
[facility] Emergency Department Provider Note   ____________________________________________    I have reviewed the triage vital signs and the nursing notes.   HISTORY  Chief Complaint Elbow pain and cough    HPI Brenda Middleton is a 57 y.o. female who presents with complaints of right elbow pain and redness as well as a nonproductive cough over the last 2 to 3 days.  Patient reports she had a scab on her right elbow and got knocked off recently and she has developed redness, mild swelling and a burning sensation around her arm in that area.  She denies fevers, she has never had this before.  No nausea or vomiting.  She also reports a nonproductive cough over the last several days, does report she smokes cigarettes, no history of COPD.  No IV drug abuse   Past Medical History:  Diagnosis Date  . Anxiety   . Benign neoplasm of cervix uteri   . Chronic hepatitis C without mention of hepatic coma   . Chronic low back pain 08/26/2014  . Constipation   . Depressive disorder   . Displacement of cervical intervertebral disc   . Hypertensive disorder   . Palpitations   . Prolapsed cervical intervertebral disc     Patient Active Problem List   Diagnosis Date Noted  . Chronic low back pain 08/26/2014    Past Surgical History:  Procedure Laterality Date  . CONIZATION CERVIX    . DILATION AND CURETTAGE OF UTERUS    . HAND SURGERY  2008,2015   Carpel Tunnel  . TONSILLECTOMY    . VULVECTOMY      Prior to Admission medications   Medication Sig Start Date End Date Taking? Authorizing Provider  albuterol (PROVENTIL HFA;VENTOLIN HFA) 108 (90 BASE) MCG/ACT inhaler Inhale 2 puffs into the lungs every 4 (four) hours as needed for wheezing or shortness of breath.    [provider]  amitriptyline (ELAVIL) 25 MG tablet Take 2 tablets (50 mg total) by mouth at bedtime. 08/26/14   Kathrynn Ducking, MD  budesonide-formoterol [facility]) 80-4.5 MCG/ACT  inhaler Inhale 2 puffs into the lungs 2 (two) times daily.    [provider]  cephALEXin (KEFLEX) 500 MG capsule Take 1 capsule (500 mg total) by mouth 2 (two) times daily. 07/28/18   Lavonia Drafts, MD  clonazePAM (KLONOPIN) 0.5 MG tablet Take 0.5 mg by mouth 2 (two) times daily.    [provider]  cyclobenzaprine (FLEXERIL) 10 MG tablet Take 10 mg by mouth 3 (three) times daily.    [provider]  guaiFENesin-codeine 100-10 MG/5ML syrup Take 5 mLs by mouth every 6 (six) hours as needed for cough. 01/12/16   Johnn Hai, PA-C  Linaclotide [facility]) 145 MCG CAPS capsule Take 1 capsule by mouth daily.    [provider]  lisinopril (PRINIVIL,ZESTRIL) 10 MG tablet Take 10 mg by mouth daily.    [provider]  methadone (DOLOPHINE) 10 MG tablet Take 10 mg by mouth every 8 (eight) hours.    [provider]  morphine (MSIR) 15 MG tablet Take 15 mg by mouth 3 (three) times daily.    [provider]  ondansetron (ZOFRAN ODT) 8 MG disintegrating tablet Take 1 tablet (8 mg total) by mouth every 8 (eight) hours as needed for nausea or vomiting. 12/27/15   Carrie Mew, MD  oxyCODONE-acetaminophen (ROXICET) 5-325 MG tablet Take 1 tablet by mouth every 6 (six) hours as needed for severe  pain. 12/27/15   Carrie Mew, MD  predniSONE (DELTASONE) 10 MG tablet Take 6 tablets  today, on day 2 take 5 tablets, day 3 take 4 tablets, day 4 take 3 tablets, day 5 take  2 tablets and 1 tablet the last day 01/12/16   Johnn Hai, PA-C  prochlorperazine (COMPAZINE) 10 MG tablet Take 10 mg by mouth every 6 (six) hours as needed for nausea or vomiting.    [provider]  sulfamethoxazole-trimethoprim (BACTRIM DS,SEPTRA DS) 800-160 MG tablet Take 1 tablet by mouth 2 (two) times daily. 07/28/18   Lavonia Drafts, MD  Suvorexant (BELSOMRA) 20 MG TABS Take 1 tablet by mouth daily.    [provider]  temazepam (RESTORIL) 30 MG capsule  Take 30 mg by mouth at bedtime.    [provider]  traMADol (ULTRAM) 50 MG tablet Take 1 tablet (50 mg total) by mouth every 6 (six) hours as needed. 08/26/14   Kathrynn Ducking, MD  Umeclidinium-Vilanterol [facility] ELLIPTA) 62.5-25 MCG/INH AEPB Inhale 1 puff into the lungs daily.    [provider]     Allergies Penicillin g; Penicillin v; and Penicillins  Family History  Problem Relation Age of Onset  . Breast cancer Mother 63  . Lung cancer Father   . Cancer Brother   . Cancer Unknown   . Stroke Unknown     Social History Social History   Tobacco Use  . Smoking status: Current Every Day Smoker    Packs/day: 0.70    Years: 35.00    Pack years: 24.50    Types: Cigarettes  . Smokeless tobacco: Never Used  Substance Use Topics  . Alcohol use: Yes    Comment: occasional wine  . Drug use: No    Review of Systems  Constitutional: No fever/chills Eyes: No visual changes.  ENT: No sore throat. Cardiovascular: Denies chest pain. Respiratory: As above Gastrointestinal: No abdominal pain.  No nausea, no vomiting.   Genitourinary: Negative for dysuria. Musculoskeletal: Elbow pain Skin: As above Neurological: Negative for headaches or weakness   ____________________________________________   PHYSICAL EXAM:  VITAL SIGNS: ED Triage Vitals  Enc Vitals Group     BP 07/28/18 1616 (!) 163/79     Pulse Rate 07/28/18 1616 92     Resp 07/28/18 1616 18     Temp 07/28/18 1616 99.7 F (37.6 C)     Temp Source 07/28/18 1616 Oral     SpO2 07/28/18 1616 100 %     Weight 07/28/18 1616 65.8 kg (145 lb)     Height 07/28/18 1616 1.676 m (5\' 6" )     Head Circumference --      Peak Flow --      Pain Score 07/28/18 1626 7     Pain Loc --      Pain Edu? --      Excl. in [place]? --     Constitutional: Alert and oriented. No acute distress. Pleasant and interactive  Nose: No congestion/rhinnorhea. Mouth/Throat: Mucous membranes are moist.    Cardiovascular:  Normal rate, regular rhythm. Grossly normal heart sounds.  Good peripheral circulation. Respiratory: Normal respiratory effort.  No retractions.  Positive wheezing scattered Gastrointestinal: Soft and nontender. No distention.    Musculoskeletal: Right elbow, healing wound overlying the elbow but erythema extending around the arm distally, mild.  Compartments are soft.  Warm and well perfused, normal pulses.  No drainable collections Neurologic:  Normal speech and language. No gross focal neurologic deficits are  appreciated.  Skin: As above Psychiatric: Mood and affect are normal. Speech and behavior are normal.  ____________________________________________   LABS (all labs ordered are listed, but only abnormal results are displayed)  Labs Reviewed - No data to display ____________________________________________  EKG   ____________________________________________  RADIOLOGY  Chronic bronchitis on chest x-ray, no pneumonia ____________________________________________   PROCEDURES  Procedure(s) performed: No  Procedures   Critical Care performed: No ____________________________________________   INITIAL IMPRESSION / ASSESSMENT AND PLAN / ED COURSE  Pertinent labs & imaging results that were available during my care of the patient were reviewed by me and considered in my medical decision making (see chart for details).  Patient presents with complaints of both cough as well as redness to the right elbow.  Elbow exam is consistent with cellulitis, no drainable collections.  Not consistent with a septic joint, full range of motion without pain of the right elbow.  Temperature is mildly elevated but heart rate and blood pressure normal.  Will start on Bactrim and Keflex, discussed the patient that we will need to recheck the  infection in 24 to 48 hours.  Patient's breathing improved significantly after DuoNeb.    ____________________________________________   FINAL  CLINICAL IMPRESSION(S) / ED DIAGNOSES  Final diagnoses:  Cellulitis of right upper extremity        Note:  This document was prepared using Dragon voice recognition software and may include unintentional dictation errors.    Lavonia Drafts, MD 07/28/18 [phone]

## 2018-09-19 ENCOUNTER — Emergency Department: Payer: Medicaid Other

## 2018-09-19 ENCOUNTER — Other Ambulatory Visit: Payer: Self-pay

## 2018-09-19 ENCOUNTER — Emergency Department
Admission: EM | Admit: 2018-09-19 | Discharge: 2018-09-19 | Discharge status: Against Medical Advice | Payer: Medicaid Other | Attending: Emergency Medicine | Admitting: Emergency Medicine

## 2018-09-19 DIAGNOSIS — F17210 Nicotine dependence, cigarettes, uncomplicated: Secondary | ICD-10-CM | POA: Diagnosis not present

## 2018-09-19 DIAGNOSIS — Z79899 Other long term (current) drug therapy: Secondary | ICD-10-CM | POA: Diagnosis not present

## 2018-09-19 DIAGNOSIS — J209 Acute bronchitis, unspecified: Secondary | ICD-10-CM | POA: Insufficient documentation

## 2018-09-19 DIAGNOSIS — I10 Essential (primary) hypertension: Secondary | ICD-10-CM | POA: Insufficient documentation

## 2018-09-19 DIAGNOSIS — R05 Cough: Secondary | ICD-10-CM | POA: Diagnosis present

## 2018-09-19 MED ORDER — IPRATROPIUM-ALBUTEROL 0.5-2.5 (3) MG/3ML IN SOLN
3.0000 mL | Freq: Once | RESPIRATORY_TRACT | Status: AC
  Administered 2018-09-19: 3 mL via RESPIRATORY_TRACT
  Filled 2018-09-19: qty 3

## 2018-09-19 MED ORDER — IPRATROPIUM-ALBUTEROL 0.5-2.5 (3) MG/3ML IN SOLN
3.0000 mL | Freq: Once | RESPIRATORY_TRACT | Status: DC
  Filled 2018-09-19: qty 3

## 2018-09-19 MED ORDER — PREDNISONE 20 MG PO TABS
60.0000 mg | ORAL_TABLET | Freq: Once | ORAL | Status: DC
Start: 2018-09-19 — End: 2018-09-19
  Filled 2018-09-19: qty 3

## 2018-09-19 NOTE — ED Provider Notes (Signed)
[facility] Emergency Department Provider Note  ____________________________________________   First MD Initiated Contact with Patient 09/19/18 0902     (approximate)  I have reviewed the triage vital signs and the nursing notes.   HISTORY  Chief Complaint Cough   HPI ALIANYS CHACKO is a 57 y.o. female presents to the ED with complaint of green productive cough for the last 2 weeks.  Patient denies any fever, chills, nausea or vomiting.  Patient is a pack-a-day smoker since she was a teenager.  She is taken some over-the-counter medication with minimal relief.  Patient is also been taking left over clindamycin from a dental infection.  Patient has history of bronchitis.  Past Medical History:  Diagnosis Date  . Anxiety   . Benign neoplasm of cervix uteri   . Chronic hepatitis C without mention of hepatic coma   . Chronic low back pain 08/26/2014  . Constipation   . Depressive disorder   . Displacement of cervical intervertebral disc   . Hypertensive disorder   . Palpitations   . Prolapsed cervical intervertebral disc     Patient Active Problem List   Diagnosis Date Noted  . Chronic low back pain 08/26/2014    Past Surgical History:  Procedure Laterality Date  . CONIZATION CERVIX    . DILATION AND CURETTAGE OF UTERUS    . HAND SURGERY  2008,2015   Carpel Tunnel  . TONSILLECTOMY    . VULVECTOMY      Prior to Admission medications   Medication Sig Start Date End Date Taking? Authorizing Provider  albuterol (PROVENTIL HFA;VENTOLIN HFA) 108 (90 BASE) MCG/ACT inhaler Inhale 2 puffs into the lungs every 4 (four) hours as needed for wheezing or shortness of breath.    [provider]  amitriptyline (ELAVIL) 25 MG tablet Take 2 tablets (50 mg total) by mouth at bedtime. 08/26/14   Kathrynn Ducking, MD  budesonide-formoterol [facility]) 80-4.5 MCG/ACT inhaler Inhale 2 puffs into the lungs 2 (two) times daily.    [provider]    cephALEXin (KEFLEX) 500 MG capsule Take 1 capsule (500 mg total) by mouth 2 (two) times daily. 07/28/18   Lavonia Drafts, MD  clonazePAM (KLONOPIN) 0.5 MG tablet Take 0.5 mg by mouth 2 (two) times daily.    [provider]  cyclobenzaprine (FLEXERIL) 10 MG tablet Take 10 mg by mouth 3 (three) times daily.    [provider]  guaiFENesin-codeine 100-10 MG/5ML syrup Take 5 mLs by mouth every 6 (six) hours as needed for cough. 01/12/16   Johnn Hai, PA-C  Linaclotide [facility]) 145 MCG CAPS capsule Take 1 capsule by mouth daily.    [provider]  lisinopril (PRINIVIL,ZESTRIL) 10 MG tablet Take 10 mg by mouth daily.    [provider]  methadone (DOLOPHINE) 10 MG tablet Take 10 mg by mouth every 8 (eight) hours.    [provider]  morphine (MSIR) 15 MG tablet Take 15 mg by mouth 3 (three) times daily.    [provider]  ondansetron (ZOFRAN ODT) 8 MG disintegrating tablet Take 1 tablet (8 mg total) by mouth every 8 (eight) hours as needed for nausea or vomiting. 12/27/15   Carrie Mew, MD  oxyCODONE-acetaminophen (ROXICET) 5-325 MG tablet Take 1 tablet by mouth every 6 (six) hours as needed for severe pain. 12/27/15   Carrie Mew, MD  predniSONE (DELTASONE) 10 MG tablet Take 6 tablets  today, on day 2 take 5 tablets, day 3  take 4 tablets, day 4 take 3 tablets, day 5 take  2 tablets and 1 tablet the last day 01/12/16   Johnn Hai, PA-C  prochlorperazine (COMPAZINE) 10 MG tablet Take 10 mg by mouth every 6 (six) hours as needed for nausea or vomiting.    [provider]  sulfamethoxazole-trimethoprim (BACTRIM DS,SEPTRA DS) 800-160 MG tablet Take 1 tablet by mouth 2 (two) times daily. 07/28/18   Lavonia Drafts, MD  Suvorexant (BELSOMRA) 20 MG TABS Take 1 tablet by mouth daily.    [provider]  temazepam (RESTORIL) 30 MG capsule Take 30 mg by mouth at bedtime.    [provider]  traMADol (ULTRAM) 50 MG  tablet Take 1 tablet (50 mg total) by mouth every 6 (six) hours as needed. 08/26/14   Kathrynn Ducking, MD  Umeclidinium-Vilanterol [facility] ELLIPTA) 62.5-25 MCG/INH AEPB Inhale 1 puff into the lungs daily.    [provider]    Allergies Penicillin g; Penicillin v; and Penicillins  Family History  Problem Relation Age of Onset  . Breast cancer Mother 28  . Lung cancer Father   . Cancer Brother   . Cancer Unknown   . Stroke Unknown     Social History Social History   Tobacco Use  . Smoking status: Current Every Day Smoker    Packs/day: 0.70    Years: 35.00    Pack years: 24.50    Types: Cigarettes  . Smokeless tobacco: Never Used  Substance Use Topics  . Alcohol use: Yes    Comment: occasional wine  . Drug use: No    Review of Systems Constitutional: No fever/chills Eyes: No visual changes. ENT: No sore throat. Cardiovascular: Denies chest pain. Respiratory: Denies shortness of breath.  Positive productive cough. Gastrointestinal: No abdominal pain.  No nausea, no vomiting.  Musculoskeletal: Negative for back pain. Skin: Negative for rash. Neurological: Negative for headaches, focal weakness or numbness. ____________________________________________   PHYSICAL EXAM:  VITAL SIGNS: ED Triage Vitals  Enc Vitals Group     BP 09/19/18 0856 135/85     Pulse Rate 09/19/18 0854 94     Resp 09/19/18 0854 17     Temp 09/19/18 0854 98.3 F (36.8 C)     Temp Source 09/19/18 0854 Oral     SpO2 09/19/18 0854 99 %     Weight 09/19/18 0854 145 lb (65.8 kg)     Height 09/19/18 0854 5\' 6"  (1.676 m)     Head Circumference --      Peak Flow --      Pain Score 09/19/18 0854 0     Pain Loc --      Pain Edu? --      Excl. in [place]? --    Constitutional: Alert and oriented. Well appearing and in no acute distress. Eyes: Conjunctivae are normal.  Head: Atraumatic. Mouth/Throat: Mucous membranes are moist.  Oropharynx non-erythematous. Neck: No stridor.     Hematological/Lymphatic/Immunilogical: No cervical lymphadenopathy. Cardiovascular: Normal rate, regular rhythm. Grossly normal heart sounds.  Good peripheral circulation. Respiratory: Normal respiratory effort.  No retractions. Lungs faint expiratory wheeze heard throughout.  Patient has a very congested cough.  There is minimal improvement of her wheeze with coughing.  There was some improvement noted after a DuoNeb treatment.  Patient had less coughing. Musculoskeletal: No lower extremity tenderness nor edema.  No joint effusions. Neurologic:  Normal speech and language. No gross focal neurologic deficits are appreciated. No gait instability. Skin:  Skin is  warm, dry and intact. No rash noted. Psychiatric: Mood and affect are normal. Speech and behavior are normal.  ____________________________________________   LABS (all labs ordered are listed, but only abnormal results are displayed)  Labs Reviewed - No data to display  RADIOLOGY Patient refused   PROCEDURES  Procedure(s) performed: None  Procedures  Critical Care performed: No  ____________________________________________   INITIAL IMPRESSION / ASSESSMENT AND PLAN / ED COURSE  As part of my medical decision making, I reviewed the following data within the electronic MEDICAL RECORD NUMBER Notes from prior ED visits and [place] Controlled Substance Database  ----------------------------------------- 9:55 AM on 09/19/2018 ----------------------------------------- Patient eloped from examination room after prednisone and DuoNeb was ordered but not given.   ____________________________________________   FINAL CLINICAL IMPRESSION(S) / ED DIAGNOSES  Final diagnoses:  Acute bronchitis, unspecified organism  Cigarette smoker     ED Discharge Orders    None       Note:  This document was prepared using Dragon voice recognition software and may include unintentional dictation errors.    Johnn Hai,  PA-C 09/19/18 1103    Lisa Roca, MD 09/19/18 1150

## 2018-09-19 NOTE — ED Notes (Addendum)
While this RN was getting medicines out of pyxis, staff witnessed patient walk out of room with friend and down the hall to exit

## 2018-09-19 NOTE — ED Triage Notes (Signed)
Pt c/o cough with green sputum for the past 2 weeks.

## 2019-08-27 ENCOUNTER — Inpatient Hospital Stay: Payer: Medicaid Other | Attending: Oncology | Admitting: Oncology

## 2019-09-04 ENCOUNTER — Inpatient Hospital Stay: Payer: Medicaid Other | Admitting: Oncology

## 2019-09-10 ENCOUNTER — Encounter: Payer: Self-pay | Admitting: Emergency Medicine

## 2019-09-10 ENCOUNTER — Inpatient Hospital Stay: Payer: Medicaid Other | Admitting: Oncology

## 2019-09-10 ENCOUNTER — Emergency Department: Payer: Medicaid Other

## 2019-09-10 ENCOUNTER — Other Ambulatory Visit: Payer: Self-pay

## 2019-09-10 ENCOUNTER — Emergency Department
Admission: EM | Admit: 2019-09-10 | Discharge: 2019-09-10 | Disposition: A | Discharge status: Home / Self Care | Payer: Medicaid Other | Attending: Emergency Medicine | Admitting: Emergency Medicine

## 2019-09-10 DIAGNOSIS — Z79899 Other long term (current) drug therapy: Secondary | ICD-10-CM | POA: Diagnosis not present

## 2019-09-10 DIAGNOSIS — F17210 Nicotine dependence, cigarettes, uncomplicated: Secondary | ICD-10-CM | POA: Insufficient documentation

## 2019-09-10 DIAGNOSIS — M25551 Pain in right hip: Secondary | ICD-10-CM | POA: Diagnosis not present

## 2019-09-10 DIAGNOSIS — Y998 Other external cause status: Secondary | ICD-10-CM | POA: Insufficient documentation

## 2019-09-10 DIAGNOSIS — Y92414 Local residential or business street as the place of occurrence of the external cause: Secondary | ICD-10-CM | POA: Insufficient documentation

## 2019-09-10 DIAGNOSIS — Y9389 Activity, other specified: Secondary | ICD-10-CM | POA: Diagnosis not present

## 2019-09-10 DIAGNOSIS — M25561 Pain in right knee: Secondary | ICD-10-CM | POA: Diagnosis not present
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
## 2019-09-12 ENCOUNTER — Inpatient Hospital Stay: Payer: Medicaid Other

## 2019-09-12 ENCOUNTER — Inpatient Hospital Stay: Payer: Medicaid Other | Attending: Oncology | Admitting: Oncology

## 2019-09-12 ENCOUNTER — Other Ambulatory Visit: Payer: Self-pay

## 2019-09-12 ENCOUNTER — Encounter: Payer: Self-pay | Admitting: Oncology

## 2019-09-12 VITALS — BP 131/82 | HR 96 | Temp 98.5°F | Resp 16 | Ht 66.0 in | Wt 144.8 lb

## 2019-09-12 DIAGNOSIS — D500 Iron deficiency anemia secondary to blood loss (chronic): Secondary | ICD-10-CM | POA: Diagnosis not present

## 2019-09-12 DIAGNOSIS — K921 Melena: Secondary | ICD-10-CM | POA: Diagnosis not present

## 2019-09-12 DIAGNOSIS — Z809 Family history of malignant neoplasm, unspecified: Secondary | ICD-10-CM | POA: Insufficient documentation

## 2019-09-12 DIAGNOSIS — K922 Gastrointestinal hemorrhage, unspecified: Secondary | ICD-10-CM | POA: Diagnosis not present

## 2019-09-12 DIAGNOSIS — R748 Abnormal levels of other serum enzymes: Secondary | ICD-10-CM | POA: Diagnosis not present

## 2019-09-12 DIAGNOSIS — Z8544 Personal history of malignant neoplasm of other female genital organs: Secondary | ICD-10-CM | POA: Insufficient documentation

## 2019-09-12 DIAGNOSIS — D509 Iron deficiency anemia, unspecified: Secondary | ICD-10-CM

## 2019-09-12 HISTORY — DX: Iron deficiency anemia secondary to blood loss (chronic): D50.0

## 2019-09-12 LAB — CBC WITH DIFFERENTIAL/PLATELET
Abs Immature Granulocytes: 0.02 10*3/uL (ref 0.00–0.07)
Basophils Absolute: 0 10*3/uL (ref 0.0–0.1)
Basophils Relative: 0 %
Eosinophils Absolute: 0.2 10*3/uL (ref 0.0–0.5)
Eosinophils Relative: 4 %
HCT: 27.9 % — ABNORMAL LOW (ref 36.0–46.0)
Hemoglobin: 8.5 g/dL — ABNORMAL LOW (ref 12.0–15.0)
Immature Granulocytes: 0 %
Lymphocytes Relative: 28 %
Lymphs Abs: 1.8 10*3/uL (ref 0.7–4.0)
MCH: 22.3 pg — ABNORMAL LOW (ref 26.0–34.0)
MCHC: 30.5 g/dL (ref 30.0–36.0)
MCV: 73.2 fL — ABNORMAL LOW (ref 80.0–100.0)
Monocytes Absolute: 0.8 10*3/uL (ref 0.1–1.0)
Monocytes Relative: 12 %
Neutro Abs: 3.7 10*3/uL (ref 1.7–7.7)
Neutrophils Relative %: 56 %
Platelets: 197 10*3/uL (ref 150–400)
RBC: 3.81 MIL/uL — ABNORMAL LOW (ref 3.87–5.11)
RDW: 17.9 % — ABNORMAL HIGH (ref 11.5–15.5)
Smear Review: ADEQUATE
WBC: 6.6 10*3/uL (ref 4.0–10.5)
nRBC: 0 % (ref 0.0–0.2)

## 2019-09-12 LAB — IRON AND TIBC
Iron: 19 ug/dL — ABNORMAL LOW (ref 28–170)
Saturation Ratios: 4 % — ABNORMAL LOW (ref 10.4–31.8)
TIBC: 527 ug/dL — ABNORMAL HIGH (ref 250–450)
UIBC: 508 ug/dL

## 2019-09-12 LAB — RETIC PANEL
Immature Retic Fract: 12.5 % (ref 2.3–15.9)
RBC.: 3.81 MIL/uL — ABNORMAL LOW (ref 3.87–5.11)
Retic Count, Absolute: 58.3 10*3/uL (ref 19.0–186.0)
Retic Ct Pct: 1.5 % (ref 0.4–3.1)
Reticulocyte Hemoglobin: 22.5 pg — ABNORMAL LOW (ref >27.9)

## 2019-09-12 LAB — FERRITIN: Ferritin: 9 ng/mL — ABNORMAL LOW (ref 11–307)

## 2019-09-12 LAB — TECHNOLOGIST SMEAR REVIEW
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
## 2019-09-12 NOTE — Progress Notes (Signed)
Patient does have history of anemia that improved with oral iron.

## 2019-09-15 ENCOUNTER — Telehealth: Payer: Self-pay

## 2019-09-15 NOTE — Telephone Encounter (Signed)
-----   Message from Earlie Server, MD sent at 09/12/2019 10:08 PM EDT ----- Please let pt know that her iron level is very low. I recommend IV venofer infusion weekly x 4.  Follow up plan Lab in 8 weeks, MD+/- Venofer 1-2 days after labs.  Please arrange.

## 2019-09-15 NOTE — Telephone Encounter (Signed)
Pt has been scheduled as requested.. Pt is aware

## 2019-09-18 ENCOUNTER — Ambulatory Visit: Payer: Medicaid Other

## 2019-09-23 ENCOUNTER — Inpatient Hospital Stay: Payer: Medicaid Other

## 2019-09-23 ENCOUNTER — Other Ambulatory Visit: Payer: Self-pay

## 2019-09-23 VITALS — BP 117/81 | HR 86 | Temp 99.4°F | Resp 17

## 2019-09-23 DIAGNOSIS — D500 Iron deficiency anemia secondary to blood loss (chronic): Secondary | ICD-10-CM

## 2019-09-23 DIAGNOSIS — K922 Gastrointestinal hemorrhage, unspecified: Secondary | ICD-10-CM | POA: Diagnosis not present

## 2019-09-23 MED ORDER — IRON SUCROSE 20 MG/ML IV SOLN
200.0000 mg | Freq: Once | INTRAVENOUS | Status: AC
  Administered 2019-09-23: 200 mg via INTRAVENOUS
  Filled 2019-09-23: qty 10

## 2019-09-23 MED ORDER — SODIUM CHLORIDE 0.9 % IV SOLN
Freq: Once | INTRAVENOUS | Status: AC
  Administered 2019-09-23: 14:00:00 via INTRAVENOUS
  Filled 2019-09-23: qty 250

## 2019-09-23 NOTE — Progress Notes (Signed)
Pt tolerated infusion well. Pt denies any concerns or complaints at this time. Pt educated to call clinic with any questions or concerns or report to ER/Call 911 if an emergency arises. Pt and VS stable at discharge.

## 2019-09-25 ENCOUNTER — Ambulatory Visit: Payer: Medicaid Other

## 2019-09-30 ENCOUNTER — Other Ambulatory Visit: Payer: Self-pay

## 2019-09-30 ENCOUNTER — Inpatient Hospital Stay: Payer: Medicaid Other

## 2019-09-30 VITALS — BP 130/78 | HR 90 | Temp 98.2°F | Resp 18

## 2019-09-30 DIAGNOSIS — D500 Iron deficiency anemia secondary to blood loss (chronic): Secondary | ICD-10-CM

## 2019-09-30 DIAGNOSIS — K922 Gastrointestinal hemorrhage, unspecified: Secondary | ICD-10-CM | POA: Diagnosis not present

## 2019-09-30 MED ORDER — SODIUM CHLORIDE 0.9 % IV SOLN
Freq: Once | INTRAVENOUS | Status: AC
  Administered 2019-09-30: 14:00:00 via INTRAVENOUS
  Filled 2019-09-30: qty 250

## 2019-09-30 MED ORDER — IRON SUCROSE 20 MG/ML IV SOLN
200.0000 mg | Freq: Once | INTRAVENOUS | Status: AC
  Administered 2019-09-30: 14:00:00 200 mg via INTRAVENOUS
  Filled 2019-09-30: qty 10

## 2019-10-02 ENCOUNTER — Ambulatory Visit: Payer: Medicaid Other

## 2019-10-06 ENCOUNTER — Other Ambulatory Visit: Payer: Self-pay

## 2019-10-07 ENCOUNTER — Inpatient Hospital Stay: Payer: Medicaid Other | Attending: Oncology

## 2019-10-07 ENCOUNTER — Other Ambulatory Visit: Payer: Self-pay

## 2019-10-07 VITALS — BP 135/78 | HR 86 | Resp 18

## 2019-10-07 DIAGNOSIS — D509 Iron deficiency anemia, unspecified: Secondary | ICD-10-CM | POA: Diagnosis not present

## 2019-10-07 DIAGNOSIS — D500 Iron deficiency anemia secondary to blood loss (chronic): Secondary | ICD-10-CM

## 2019-10-07 MED ORDER — SODIUM CHLORIDE 0.9 % IV SOLN
Freq: Once | INTRAVENOUS | Status: AC
  Administered 2019-10-07: 14:00:00 via INTRAVENOUS
  Filled 2019-10-07: qty 250

## 2019-10-07 MED ORDER — IRON SUCROSE 20 MG/ML IV SOLN
200.0000 mg | Freq: Once | INTRAVENOUS | Status: AC
  Administered 2019-10-07: 200 mg via INTRAVENOUS
  Filled 2019-10-07: qty 10

## 2019-10-09 ENCOUNTER — Ambulatory Visit: Payer: Medicaid Other

## 2019-10-14 ENCOUNTER — Inpatient Hospital Stay: Payer: Medicaid Other

## 2019-10-14 ENCOUNTER — Other Ambulatory Visit: Payer: Self-pay

## 2019-10-14 VITALS — BP 149/74 | HR 89 | Temp 96.1°F | Resp 20

## 2019-10-14 DIAGNOSIS — D500 Iron deficiency anemia secondary to blood loss (chronic): Secondary | ICD-10-CM

## 2019-10-14 DIAGNOSIS — D509 Iron deficiency anemia, unspecified: Secondary | ICD-10-CM | POA: Diagnosis not present

## 2019-10-14 MED ORDER — SODIUM CHLORIDE 0.9 % IV SOLN
Freq: Once | INTRAVENOUS | Status: AC
  Administered 2019-10-14: 14:00:00 via INTRAVENOUS
  Filled 2019-10-14: qty 250

## 2019-10-14 MED ORDER — IRON SUCROSE 20 MG/ML IV SOLN
200.0000 mg | Freq: Once | INTRAVENOUS | Status: AC
  Administered 2019-10-14: 200 mg via INTRAVENOUS
  Filled 2019-10-14: qty 10

## 2019-11-11 ENCOUNTER — Inpatient Hospital Stay: Payer: Medicaid Other

## 2019-11-12 ENCOUNTER — Inpatient Hospital Stay: Payer: Medicaid Other | Admitting: Oncology

## 2019-11-12 ENCOUNTER — Inpatient Hospital Stay: Payer: Medicaid Other

## 2019-11-20 ENCOUNTER — Other Ambulatory Visit: Payer: Medicaid Other

## 2019-11-20 ENCOUNTER — Ambulatory Visit: Payer: Medicaid Other | Admitting: Oncology

## 2019-11-20 ENCOUNTER — Ambulatory Visit: Payer: Medicaid Other

## 2019-12-02 ENCOUNTER — Other Ambulatory Visit: Payer: Self-pay | Admitting: Chiropractic Medicine

## 2019-12-02 ENCOUNTER — Inpatient Hospital Stay: Payer: Medicaid Other | Attending: Oncology

## 2019-12-02 ENCOUNTER — Ambulatory Visit
Admission: RE | Admit: 2019-12-02 | Discharge: 2019-12-02 | Disposition: A | Discharge status: Home / Self Care | Payer: Medicaid Other | Source: Ambulatory Visit | Attending: Chiropractic Medicine | Admitting: Chiropractic Medicine

## 2019-12-02 ENCOUNTER — Ambulatory Visit
Admission: RE | Admit: 2019-12-02 | Discharge: 2019-12-02 | Disposition: A | Discharge status: Home / Self Care | Payer: Medicaid Other | Source: Home / Self Care | Attending: Chiropractic Medicine | Admitting: Chiropractic Medicine

## 2019-12-02 ENCOUNTER — Telehealth: Payer: Self-pay | Admitting: *Deleted

## 2019-12-02 ENCOUNTER — Other Ambulatory Visit: Payer: Self-pay

## 2019-12-02 DIAGNOSIS — R748 Abnormal levels of other serum enzymes: Secondary | ICD-10-CM

## 2019-12-02 DIAGNOSIS — M25551 Pain in right hip: Secondary | ICD-10-CM | POA: Insufficient documentation

## 2019-12-02 DIAGNOSIS — D500 Iron deficiency anemia secondary to blood loss (chronic): Secondary | ICD-10-CM | POA: Insufficient documentation

## 2019-12-02 DIAGNOSIS — M5441 Lumbago with sciatica, right side: Secondary | ICD-10-CM

## 2019-12-02 DIAGNOSIS — K921 Melena: Secondary | ICD-10-CM | POA: Insufficient documentation

## 2019-12-02 LAB — COMPREHENSIVE METABOLIC PANEL
ALT: 51 U/L — ABNORMAL HIGH (ref 0–44)
AST: 46 U/L — ABNORMAL HIGH (ref 15–41)
Albumin: 3.7 g/dL (ref 3.5–5.0)
Alkaline Phosphatase: 101 U/L (ref 38–126)
Anion gap: 5 (ref 5–15)
BUN: 30 mg/dL — ABNORMAL HIGH (ref 6–20)
CO2: 28 mmol/L (ref 22–32)
Calcium: 8.7 mg/dL — ABNORMAL LOW (ref 8.9–10.3)
Chloride: 107 mmol/L (ref 98–111)
Creatinine, Ser: 1.13 mg/dL — ABNORMAL HIGH (ref 0.44–1.00)
GFR calc Af Amer: >60 mL/min (ref >60)
GFR calc non Af Amer: 54 mL/min — ABNORMAL LOW (ref >60)
Glucose, Bld: 89 mg/dL (ref 70–99)
Potassium: 3.7 mmol/L (ref 3.5–5.1)
Sodium: 140 mmol/L (ref 135–145)
Total Bilirubin: 0.5 mg/dL (ref 0.3–1.2)
Total Protein: 7.1 g/dL (ref 6.5–8.1)

## 2019-12-02 LAB — IRON AND TIBC
Iron: 138 ug/dL (ref 28–170)
Saturation Ratios: 35 % — ABNORMAL HIGH (ref 10.4–31.8)
TIBC: 400 ug/dL (ref 250–450)
UIBC: 262 ug/dL

## 2019-12-02 LAB — CBC WITH DIFFERENTIAL/PLATELET
Abs Immature Granulocytes: 0.02 10*3/uL (ref 0.00–0.07)
Basophils Absolute: 0 10*3/uL (ref 0.0–0.1)
Basophils Relative: 1 %
Eosinophils Absolute: 0.4 10*3/uL (ref 0.0–0.5)
Eosinophils Relative: 5 %
HCT: 39.7 % (ref 36.0–46.0)
Hemoglobin: 12.3 g/dL (ref 12.0–15.0)
Immature Granulocytes: 0 %
Lymphocytes Relative: 32 %
Lymphs Abs: 2.4 10*3/uL (ref 0.7–4.0)
MCH: 26.3 pg (ref 26.0–34.0)
MCHC: 31 g/dL (ref 30.0–36.0)
MCV: 84.8 fL (ref 80.0–100.0)
Monocytes Absolute: 0.5 10*3/uL (ref 0.1–1.0)
Monocytes Relative: 6 %
Neutro Abs: 4.2 10*3/uL (ref 1.7–7.7)
Neutrophils Relative %: 56 %
Platelets: 118 10*3/uL — ABNORMAL LOW (ref 150–400)
RBC: 4.68 MIL/uL (ref 3.87–5.11)
RDW: 25.6 % — ABNORMAL HIGH (ref 11.5–15.5)
WBC: 7.6 10*3/uL (ref 4.0–10.5)
nRBC: 0 % (ref 0.0–0.2)

## 2019-12-02 LAB — FERRITIN: Ferritin: 31 ng/mL (ref 11–307)

## 2019-12-02 NOTE — Telephone Encounter (Signed)
No IV iron tmr.  Encourage her to keep appt to discuss future follow up plan. Can do virtually

## 2019-12-02 NOTE — Telephone Encounter (Signed)
Patient called asking if she is going to need an iron infusion tomorrow and if she doesn't, she does not want to come tomorrow. If she does need iron, she wants to reschedule for next Tuesday. Please advise  Ferritin Order: [id number] Status:  Final result  Visible to patient:  No (scheduled for 12/02/2019 2:02 PM)  Next appt:  12/03/2019 at 01:30 PM in Oncology Earlie Server, MD)  Dx:  Iron deficiency anemia due to chronic...  Ref Range & Units 11:08  Ferritin 11 - 307 ng/mL 31   Comment: Performed at [facility], [address] [place], [place] [address]  Resulting Agency  [facility]      Specimen Collected: 12/02/19 11:08  Last Resulted: 12/02/19 13:02     Lab Flowsheet   Order Details   View Encounter   Lab and Collection Details   Routing   Result History         Other Results from 12/02/2019  Contains abnormal data Comprehensive metabolic panel  Status:  Final result  Visible to patient:  No (scheduled for 12/02/2019 12:29 PM)  Next appt:  12/03/2019 at 01:30 PM in Oncology Earlie Server, MD)  Dx:  Elevated alkaline phosphatase level Order: [id number]  Ref Range & Units 11:08  Sodium 135 - 145 mmol/L 140   Potassium 3.5 - 5.1 mmol/L 3.7   Chloride 98 - 111 mmol/L 107   CO2 22 - 32 mmol/L 28   Glucose, Bld 70 - 99 mg/dL 89   BUN 6 - 20 mg/dL 30High    Creatinine, Ser 0.44 - 1.00 mg/dL 1.13High    Calcium 8.9 - 10.3 mg/dL 8.7Low    Total Protein 6.5 - 8.1 g/dL 7.1   Albumin 3.5 - 5.0 g/dL 3.7   AST 15 - 41 U/L 46High    ALT 0 - 44 U/L 51High    Alkaline Phosphatase 38 - 126 U/L 101   Total Bilirubin 0.3 - 1.2 mg/dL 0.5   GFR calc non Af Amer >60 mL/min 54Low    GFR calc Af Amer >60 mL/min >60   Anion gap 5 - 15 5   Comment: Performed at [facility], [address] [place], [place] [address]  Resulting Agency  [facility]      Specimen Collected: 12/02/19 11:08  Last Resulted: 12/02/19 11:29     Lab Flowsheet   Order Details   View  Encounter   Lab and Collection Details   Routing   Result History           Contains abnormal data Iron and TIBC  Status:  Final result  Visible to patient:  No (scheduled for 12/02/2019 2:02 PM)  Next appt:  12/03/2019 at 01:30 PM in Oncology Earlie Server, MD)  Dx:  Iron deficiency anemia due to chronic... Order: [id number]  Ref Range & Units 11:08  Iron 28 - 170 ug/dL 138   TIBC 250 - 450 ug/dL 400   Saturation Ratios 10.4 - 31.8 % 35High    UIBC ug/dL 262   Comment: Performed at [facility], [address] [place], [place] [address]  Resulting Agency  [facility] LAB      Specimen Collected: 12/02/19 11:08  Last Resulted: 12/02/19 13:02       CBC with Differential/Platelet Order: [id number] Status:  Final result Visible to patient:  No (scheduled for 12/02/2019 12:17 PM) Next appt:  12/03/2019 at 01:30 PM in Oncology Earlie Server, MD) Dx:  Iron deficiency  anemia due to chronic...  Ref Range & Units 11:08 2 mo ago 3 yr ago  WBC 4.0 - 10.5 K/uL 7.6  6.6  10.6 R   RBC 3.87 - 5.11 MIL/uL 4.68  3.81Low   4.79 R   Hemoglobin 12.0 - 15.0 g/dL 12.3  8.5Low   13.0 R   HCT 36.0 - 46.0 % 39.7  27.9Low   39.6 R   MCV 80.0 - 100.0 fL 84.8  73.2Low   82.7   MCH 26.0 - 34.0 pg 26.3  22.3Low   27.1   MCHC 30.0 - 36.0 g/dL 31.0  30.5  32.8 R   RDW 11.5 - 15.5 % 25.6High   17.9High   24.8High  R   Platelets 150 - 400 K/uL 118Low   197  163 R   nRBC 0.0 - 0.2 % 0.0  0.0    Neutrophils Relative % % 56  56  70   Neutro Abs 1.7 - 7.7 K/uL 4.2  3.7  7.5High  R   Lymphocytes Relative % 32  28  19   Lymphs Abs 0.7 - 4.0 K/uL 2.4  1.8  2.0 R   Monocytes Relative % 6  12  7    Monocytes Absolute 0.1 - 1.0 K/uL 0.5  0.8  0.8 R   Eosinophils Relative % 5  4  3    Eosinophils Absolute 0.0 - 0.5 K/uL 0.4  0.2  0.3 R   Basophils Relative % 1  0  1   Basophils Absolute 0.0 - 0.1 K/uL 0.0  0.0  0.1 R   Immature Granulocytes % 0  0    Abs Immature Granulocytes 0.00 - 0.07 K/uL 0.02  0.02 CM     Comment: Performed at [facility], [address] [place], [place] [address]  WBC Morphology   UNREMARKABLE    RBC Morphology   MIXED RBC POPULATION WITH SLIGHT HYPOCHROMASIA, FEW TARGET CELLS AND OCC FRAGMENTED RBCS NOTED    Smear Review   PLATELETS APPEAR ADEQUATE CM    Resulting Agency  [facility] [place]      Specimen Collected: 12/02/19 11:08 Last Resulted: 12/02/19 11:17

## 2019-12-02 NOTE — Telephone Encounter (Signed)
Patient has no transportation for tomorrow and cannot do virtual appointment. She has rescheduled for 1/14 which is your next available appt

## 2019-12-03 ENCOUNTER — Inpatient Hospital Stay: Payer: Medicaid Other | Admitting: Oncology

## 2019-12-03 ENCOUNTER — Inpatient Hospital Stay: Payer: Medicaid Other

## 2019-12-17 NOTE — Progress Notes (Deleted)
Called patient no answer left message  

## 2019-12-18 ENCOUNTER — Inpatient Hospital Stay: Payer: Medicaid Other | Admitting: Oncology

## 2019-12-22 ENCOUNTER — Telehealth: Payer: Self-pay | Admitting: *Deleted

## 2019-12-22 NOTE — Telephone Encounter (Signed)
Per pt request on 12/22/19 to cancel 12/23/19 appts. Pt stated that she would call back at a later date to R/S.Brenda Middleton

## 2019-12-23 ENCOUNTER — Inpatient Hospital Stay: Payer: Medicaid Other | Admitting: Oncology

## 2020-02-23 ENCOUNTER — Ambulatory Visit: Payer: Medicaid Other

## 2020-08-29 ENCOUNTER — Emergency Department
Admission: EM | Admit: 2020-08-29 | Discharge: 2020-08-29 | Disposition: A | Discharge status: Other Acute Inpatient Hospital | Payer: Medicaid Other

## 2020-12-02 ENCOUNTER — Ambulatory Visit: Payer: Medicaid Other | Admitting: Dermatology

## 2020-12-28 ENCOUNTER — Ambulatory Visit: Payer: Medicaid Other | Admitting: Dermatology

## 2021-01-01 ENCOUNTER — Other Ambulatory Visit: Payer: Self-pay

## 2021-01-01 ENCOUNTER — Ambulatory Visit
Admission: EM | Admit: 2021-01-01 | Discharge: 2021-01-01 | Disposition: A | Discharge status: Home / Self Care | Payer: Medicaid Other | Attending: Family Medicine | Admitting: Family Medicine

## 2021-01-01 ENCOUNTER — Encounter: Payer: Self-pay | Admitting: Emergency Medicine

## 2021-01-01 DIAGNOSIS — B9789 Other viral agents as the cause of diseases classified elsewhere: Secondary | ICD-10-CM | POA: Diagnosis not present

## 2021-01-01 DIAGNOSIS — Z20822 Contact with and (suspected) exposure to covid-19: Secondary | ICD-10-CM | POA: Diagnosis not present

## 2021-01-01 DIAGNOSIS — J988 Other specified respiratory disorders: Secondary | ICD-10-CM | POA: Diagnosis present

## 2021-01-01 LAB — GROUP A STREP BY PCR: Group A Strep by PCR: NOT DETECTED

## 2021-01-01 NOTE — ED Provider Notes (Signed)
[facility]    CSN: [id number] Arrival date & time: 01/01/21  1230      History   Chief Complaint Chief Complaint  Patient presents with  . Cough  . Headache   HPI  60 year old female presents with the above complaints.  Patient reports her symptoms started Wednesday. She notes recent Covid exposure. Reports headache, mild cough, and hoarseness. No fever. No shortness of breath. No relieving factors. Desires Covid testing today. No other associated symptoms. No other complaints.  Past Medical History:  Diagnosis Date  . Anxiety   . Benign neoplasm of cervix uteri   . Chronic hepatitis C without mention of hepatic coma   . Chronic low back pain 08/26/2014  . Constipation   . Depressive disorder   . Displacement of cervical intervertebral disc   . Hypertensive disorder   . Iron deficiency anemia due to chronic blood loss 09/12/2019  . Palpitations   . Prolapsed cervical intervertebral disc     Patient Active Problem List   Diagnosis Date Noted  . Iron deficiency anemia due to chronic blood loss 09/12/2019  . Family history of cancer 09/12/2019  . Blood in the stool 09/12/2019  . Elevated alkaline phosphatase level 09/12/2019  . Chronic low back pain 08/26/2014    Past Surgical History:  Procedure Laterality Date  . CONIZATION CERVIX    . DILATION AND CURETTAGE OF UTERUS    . HAND SURGERY  2008,2015   Carpel Tunnel  . TONSILLECTOMY    . VULVECTOMY      OB History   No obstetric history on file.      Home Medications    Prior to Admission medications   Medication Sig Start Date End Date Taking? Authorizing Provider  budesonide-formoterol (SYMBICORT) 80-4.5 MCG/ACT inhaler Inhale 2 puffs into the lungs 2 (two) times daily.   Yes [provider]  busPIRone (BUSPAR) 10 MG tablet Take 10 mg by mouth 2 (two) times daily. Patient is unsure of dose   Yes [provider]  lisinopril (PRINIVIL,ZESTRIL) 10 MG tablet Take 10 mg by mouth  daily.   Yes [provider]  Meloxicam 10 MG CAPS Take 1 capsule by mouth daily as needed.   Yes [provider]  prochlorperazine (COMPAZINE) 10 MG tablet Take 10 mg by mouth every 6 (six) hours as needed for nausea or vomiting.   Yes [provider]  QUEtiapine (SEROQUEL) 400 MG tablet Take 400 mg by mouth at bedtime.   Yes [provider]  albuterol (PROVENTIL HFA;VENTOLIN HFA) 108 (90 BASE) MCG/ACT inhaler Inhale 2 puffs into the lungs every 4 (four) hours as needed for wheezing or shortness of breath.    [provider]  Linaclotide Rolan Lipa) 145 MCG CAPS capsule Take 1 capsule by mouth daily.    [provider]  amitriptyline (ELAVIL) 25 MG tablet Take 2 tablets (50 mg total) by mouth at bedtime. Patient not taking: No sig reported 08/26/14 01/01/21  Kathrynn Ducking, MD  clonazePAM (KLONOPIN) 0.5 MG tablet Take 0.5 mg by mouth 2 (two) times daily.  01/01/21  [provider]  temazepam (RESTORIL) 30 MG capsule Take 30 mg by mouth at bedtime.  01/01/21  [provider]    Family History Family History  Problem Relation Age of Onset  . Breast cancer Mother 71  . Lung cancer Father   . Cancer Brother   . Cancer Other   . Stroke Other     Social History Social  History   Tobacco Use  . Smoking status: Current Every Day Smoker    Packs/day: 0.70    Years: 35.00    Pack years: 24.50    Types: Cigarettes  . Smokeless tobacco: Never Used  Vaping Use  . Vaping Use: Never used  Substance Use Topics  . Alcohol use: Yes    Comment: occasional wine  . Drug use: No     Allergies   Penicillin g, Penicillin v, and Penicillins   Review of Systems Review of Systems Per HPI  Physical Exam Triage Vital Signs ED Triage Vitals  Enc Vitals Group     BP 01/01/21 1243 (!) 149/95     Pulse Rate 01/01/21 1243 88     Resp 01/01/21 1243 14     Temp 01/01/21 1243 98.1 F (36.7 C)     Temp Source 01/01/21 1243 Oral      SpO2 01/01/21 1243 100 %     Weight 01/01/21 1236 126 lb (57.2 kg)     Height 01/01/21 1236 5\' 6"  (1.676 m)     Head Circumference --      Peak Flow --      Pain Score 01/01/21 1236 0     Pain Loc --      Pain Edu? --      Excl. in [place]? --    Updated Vital Signs BP (!) 149/95 (BP Location: Right Arm)   Pulse 88   Temp 98.1 F (36.7 C) (Oral)   Resp 14   Ht 5\' 6"  (1.676 m)   Wt 57.2 kg   SpO2 100%   BMI 20.34 kg/m   Visual Acuity Right Eye Distance:   Left Eye Distance:   Bilateral Distance:    Right Eye Near:   Left Eye Near:    Bilateral Near:     Physical Exam Constitutional:      Comments: Chronically ill-appearing female no acute distress.  HENT:     Head: Normocephalic.     Mouth/Throat:     Pharynx: Oropharynx is clear. No oropharyngeal exudate or posterior oropharyngeal erythema.  Eyes:     General:        Right eye: No discharge.        Left eye: No discharge.     Conjunctiva/sclera: Conjunctivae normal.  Cardiovascular:     Rate and Rhythm: Normal rate and regular rhythm.  Pulmonary:     Effort: Pulmonary effort is normal.     Breath sounds: Normal breath sounds. No wheezing, rhonchi or rales.  Neurological:     Mental Status: She is alert.  Psychiatric:        Mood and Affect: Mood normal.        Behavior: Behavior normal.    UC Treatments / Results  Labs (all labs ordered are listed, but only abnormal results are displayed) Labs Reviewed  GROUP A STREP BY PCR  SARS CORONAVIRUS 2 (TAT 6-24 HRS)    EKG   Radiology No results found.  Procedures Procedures (including critical care time)  Medications Ordered in UC Medications - No data to display  Initial Impression / Assessment and Plan / UC Course  I have reviewed the triage vital signs and the nursing notes.  Pertinent labs & imaging results that were available during my care of the patient were reviewed by me and considered in my medical decision making (see chart for  details).    60 year old female presents with a viral illness. Concern  for COVID 19. Recent exposure. Advised fluids, supportive care. Awaiting test result.   Final Clinical Impressions(s) / UC Diagnoses   Final diagnoses:  Viral respiratory infection  Exposure to COVID-19 virus     Discharge Instructions     Rest.  Lots of fluids.  Honey may help the laryngitis as well.  Continue your home meds.  Result will be back tomorrow.  Take care  Dr. Lacinda Axon    ED Prescriptions    None     PDMP not reviewed this encounter.   Coral Spikes, [place] 01/01/21 1435

## 2021-01-01 NOTE — Discharge Instructions (Signed)
Rest.  Lots of fluids.  Honey may help the laryngitis as well.  Continue your home meds.  Result will be back tomorrow.  Take care  Dr. Lacinda Axon

## 2021-01-01 NOTE — ED Triage Notes (Signed)
Patient c/o hoarse voice, headache, and cough that started on Wed.  Patient denies fevers.  Patient reports white spots on the back of her throat that she noticed this morning.

## 2021-01-02 LAB — SARS CORONAVIRUS 2 (TAT 6-24 HRS): SARS Coronavirus 2: NEGATIVE

## 2021-01-20 ENCOUNTER — Other Ambulatory Visit: Payer: Self-pay

## 2021-01-20 ENCOUNTER — Emergency Department
Admission: EM | Admit: 2021-01-20 | Discharge: 2021-01-20 | Disposition: A | Discharge status: Home / Self Care | Payer: Medicaid Other | Attending: Emergency Medicine | Admitting: Emergency Medicine

## 2021-01-20 ENCOUNTER — Emergency Department: Payer: Medicaid Other

## 2021-01-20 DIAGNOSIS — F17210 Nicotine dependence, cigarettes, uncomplicated: Secondary | ICD-10-CM | POA: Diagnosis not present

## 2021-01-20 DIAGNOSIS — M5430 Sciatica, unspecified side: Secondary | ICD-10-CM

## 2021-01-20 DIAGNOSIS — Z79899 Other long term (current) drug therapy: Secondary | ICD-10-CM | POA: Insufficient documentation

## 2021-01-20 DIAGNOSIS — M5440 Lumbago with sciatica, unspecified side: Secondary | ICD-10-CM | POA: Insufficient documentation

## 2021-01-20 DIAGNOSIS — M4185 Other forms of scoliosis, thoracolumbar region: Secondary | ICD-10-CM | POA: Diagnosis not present

## 2021-01-20 DIAGNOSIS — M545 Low back pain, unspecified: Secondary | ICD-10-CM | POA: Diagnosis present

## 2021-01-20 DIAGNOSIS — I10 Essential (primary) hypertension: Secondary | ICD-10-CM | POA: Insufficient documentation

## 2021-01-20 DIAGNOSIS — M4187 Other forms of scoliosis, lumbosacral region: Secondary | ICD-10-CM

## 2021-01-20 MED ORDER — ORPHENADRINE CITRATE 30 MG/ML IJ SOLN
30.0000 mg | Freq: Two times a day (BID) | INTRAMUSCULAR | Status: DC
  Administered 2021-01-20: 30 mg via INTRAMUSCULAR
  Filled 2021-01-20: qty 2

## 2021-01-20 MED ORDER — OXYCODONE-ACETAMINOPHEN 7.5-325 MG PO TABS: 1.0000 | ORAL_TABLET | Freq: Four times a day (QID) | ORAL | 0 refills | Status: DC | PRN

## 2021-01-20 MED ORDER — METHYLPREDNISOLONE 4 MG PO TBPK
ORAL_TABLET | ORAL | 0 refills | Status: DC
Start: 2021-01-20 — End: 2021-02-01

## 2021-01-20 MED ORDER — HYDROMORPHONE HCL 1 MG/ML IJ SOLN
1.0000 mg | Freq: Once | INTRAMUSCULAR | Status: AC
  Administered 2021-01-20: 1 mg via INTRAMUSCULAR
  Filled 2021-01-20: qty 1

## 2021-01-20 MED ORDER — ORPHENADRINE CITRATE ER 100 MG PO TB12: 100.0000 mg | ORAL_TABLET | Freq: Two times a day (BID) | ORAL | 0 refills | Status: DC

## 2021-01-20 MED ORDER — DEXAMETHASONE SODIUM PHOSPHATE 10 MG/ML IJ SOLN
10.0000 mg | Freq: Once | INTRAMUSCULAR | Status: AC
  Administered 2021-01-20: 10 mg via INTRAMUSCULAR
  Filled 2021-01-20: qty 1

## 2021-01-20 NOTE — ED Triage Notes (Signed)
First Nurse: Pt is coming in by EMS for chronic back pain. Pt unable to sit due to the pain.

## 2021-01-20 NOTE — ED Notes (Signed)
Discharge instructions reviewed with pt. Pt calm , collective.

## 2021-01-20 NOTE — ED Triage Notes (Signed)
Pt has hx of sciatica, pt was playing with dog last night and back pain flared up. Pt on floor per EMS, when they arrived, pt states she was in too much pain to get to her couch. Pt states she was laying on floor for a couple of hours. Pt unable to sit due to pain, pt in recliner with feet up.

## 2021-01-20 NOTE — Discharge Instructions (Addendum)
Follow discharge care instructions and follow-up with [facility].

## 2021-01-20 NOTE — ED Provider Notes (Signed)
[facility] Emergency Department Provider Note   ____________________________________________   Event Date/Time   First MD Initiated Contact with Patient 01/20/21 1631     (approximate)  I have reviewed the triage vital signs and the nursing notes.   HISTORY  Chief Complaint Back Pain    HPI Brenda Middleton is a 60 y.o. female patient complain of flare of her sciatica.  Patient states she was playing with her dog last night and found that she could not get off the floor.  Patient states she stayed on the floor approximately 2 hours before she was able to get  onto the couch.  Patient believes she strained her left inguinal area trying to get on the couch.  Patient states she woke up this morning feeling better but found it she was unable to sit due to pain.  Patient again was on the floor when EMS arrived to bring her to the hospital.  Patient denies bladder or bowel dysfunction.  Patient rates pain as 10/10.  Patient described the pain as "sharp".  No palliative measure prior to arrival.         Past Medical History:  Diagnosis Date  . Anxiety   . Benign neoplasm of cervix uteri   . Chronic hepatitis C without mention of hepatic coma   . Chronic low back pain 08/26/2014  . Constipation   . Depressive disorder   . Displacement of cervical intervertebral disc   . Hypertensive disorder   . Iron deficiency anemia due to chronic blood loss 09/12/2019  . Palpitations   . Prolapsed cervical intervertebral disc     Patient Active Problem List   Diagnosis Date Noted  . Iron deficiency anemia due to chronic blood loss 09/12/2019  . Family history of cancer 09/12/2019  . Blood in the stool 09/12/2019  . Elevated alkaline phosphatase level 09/12/2019  . Chronic low back pain 08/26/2014    Past Surgical History:  Procedure Laterality Date  . CONIZATION CERVIX    . DILATION AND CURETTAGE OF UTERUS    . HAND SURGERY  2008,2015   Carpel Tunnel  .  TONSILLECTOMY    . VULVECTOMY      Prior to Admission medications   Medication Sig Start Date End Date Taking? Authorizing Provider  methylPREDNISolone (MEDROL DOSEPAK) 4 MG TBPK tablet Take Tapered dose as directed 01/20/21  Yes Sable Feil, PA-C  orphenadrine (NORFLEX) 100 MG tablet Take 1 tablet (100 mg total) by mouth 2 (two) times daily. 01/20/21  Yes Sable Feil, PA-C  oxyCODONE-acetaminophen (PERCOCET) 7.5-325 MG tablet Take 1 tablet by mouth every 6 (six) hours as needed for severe pain. 01/20/21  Yes Sable Feil, PA-C  albuterol (PROVENTIL HFA;VENTOLIN HFA) 108 (90 BASE) MCG/ACT inhaler Inhale 2 puffs into the lungs every 4 (four) hours as needed for wheezing or shortness of breath.    [provider]  budesonide-formoterol (SYMBICORT) 80-4.5 MCG/ACT inhaler Inhale 2 puffs into the lungs 2 (two) times daily.    [provider]  busPIRone (BUSPAR) 10 MG tablet Take 10 mg by mouth 2 (two) times daily. Patient is unsure of dose    [provider]  Linaclotide (LINZESS) 145 MCG CAPS capsule Take 1 capsule by mouth daily.    [provider]  lisinopril (PRINIVIL,ZESTRIL) 10 MG tablet Take 10 mg by mouth daily.    [provider]  Meloxicam 10 MG CAPS Take 1 capsule by mouth daily as needed.  [provider]  prochlorperazine (COMPAZINE) 10 MG tablet Take 10 mg by mouth every 6 (six) hours as needed for nausea or vomiting.    [provider]  QUEtiapine (SEROQUEL) 400 MG tablet Take 400 mg by mouth at bedtime.    [provider]  amitriptyline (ELAVIL) 25 MG tablet Take 2 tablets (50 mg total) by mouth at bedtime. Patient not taking: No sig reported 08/26/14 01/01/21  Kathrynn Ducking, MD  clonazePAM (KLONOPIN) 0.5 MG tablet Take 0.5 mg by mouth 2 (two) times daily.  01/01/21  [provider]  temazepam (RESTORIL) 30 MG capsule Take 30 mg by mouth at bedtime.  01/01/21  [provider]     Allergies Penicillin g, Penicillin v, and Penicillins  Family History  Problem Relation Age of Onset  . Breast cancer Mother 20  . Lung cancer Father   . Cancer Brother   . Cancer Other   . Stroke Other     Social History Social History   Tobacco Use  . Smoking status: Current Every Day Smoker    Packs/day: 0.70    Years: 35.00    Pack years: 24.50    Types: Cigarettes  . Smokeless tobacco: Never Used  Vaping Use  . Vaping Use: Never used  Substance Use Topics  . Alcohol use: Yes    Comment: occasional wine  . Drug use: No    Review of Systems Constitutional: No fever/chills Eyes: No visual changes. ENT: No sore throat. Cardiovascular: Denies chest pain. Respiratory: Denies shortness of breath. Gastrointestinal: No abdominal pain.  No nausea, no vomiting.  No diarrhea.  No constipation. Genitourinary: Negative for dysuria. Musculoskeletal: Chronic back pain.   Skin: Negative for rash. Neurological: Negative for headaches, focal weakness or numbness. Psychiatric:  Anxiety and depression. Endocrine:  Hepatitis Hematological/Lymphatic:  Iron deficient anemia Allergic/Immunilogical: Penicillin ____________________________________________   PHYSICAL EXAM:  VITAL SIGNS: ED Triage Vitals  Enc Vitals Group     BP 01/20/21 1612 (!) 184/97     Pulse Rate 01/20/21 1605 81     Resp 01/20/21 1605 16     Temp 01/20/21 1605 97.7 F (36.5 C)     Temp Source 01/20/21 1605 Oral     SpO2 01/20/21 1605 100 %     Weight 01/20/21 1607 121 lb (54.9 kg)     Height 01/20/21 1607 5\' 6"  (1.676 m)     Head Circumference --      Peak Flow --      Pain Score 01/20/21 1606 10     Pain Loc --      Pain Edu? --      Excl. in [place]? --     Constitutional: Alert and oriented.  Moderate distress.  Neck: No stridor.  No cervical spine tenderness to palpation. Hematological/Lymphatic/Immunilogical: No cervical lymphadenopathy. Cardiovascular: Normal rate, regular rhythm. Grossly  normal heart sounds.  Good peripheral circulation.  Elevated blood pressure Respiratory: Normal respiratory effort.  No retractions. Lungs CTAB. Gastrointestinal: Soft and nontender. No distention. No abdominal bruits. No CVA tenderness. Genitourinary: Deferred Musculoskeletal: No lower extremity tenderness nor edema.  No joint effusions. Neurologic:  Normal speech and language. No gross focal neurologic deficits are appreciated. No gait instability. Skin:  Skin is warm, dry and intact. No rash noted. Psychiatric: Mood and affect are normal. Speech and behavior are normal.  ____________________________________________   LABS (all labs ordered are listed, but only abnormal results are displayed)  Labs Reviewed - No data to display ____________________________________________  EKG   ____________________________________________  [place], personally viewed and evaluated these images (plain radiographs) as part of my medical decision making, as well as reviewing the written report by the radiologist.  ED MD interpretation: Scoliosis and degenerative changes to the lumbar spine.  Official radiology report(s): DG Lumbar Spine 2-3 Views  Result Date: 01/20/2021 CLINICAL DATA:  Low back pain EXAM: LUMBAR SPINE - 2-3 VIEW COMPARISON:  None. FINDINGS: Frontal, lateral, and spot lumbosacral lateral images were obtained. There are 5 non-rib-bearing lumbar type vertebral bodies. There is lumbar dextroscoliosis with rotatory component. There is no fracture or spondylolisthesis. There is disc space narrowing at all levels with vacuum phenomenon at L2-3. Facet arthropathy noted at all levels. There is aortic and iliac artery atherosclerosis. IMPRESSION: Scoliosis with multilevel arthropathy. No fracture or spondylolisthesis. Aortic Atherosclerosis (ICD10-I70.0). Electronically Signed   By: Lowella Grip III M.D.   On: 01/20/2021 18:21     ____________________________________________   PROCEDURES  Procedure(s) performed (including Critical Care):  Procedures   ____________________________________________   INITIAL IMPRESSION / ASSESSMENT AND PLAN / ED COURSE  As part of my medical decision making, I reviewed the following data within the [place]         Patient presents with acute low back pain with radicular component.  Discussed x-ray findings with patient showing lumbar dextroscoliosis with rotatory component.  Patient has degenerative changes throughout the lumbar spine.  Advised to follow-up with her treating orthopedic [facility]  Take medication as directed.      ____________________________________________   FINAL CLINICAL IMPRESSION(S) / ED DIAGNOSES  Final diagnoses:  Sciatica, unspecified laterality  Dextroscoliosis of lumbosacral spine     ED Discharge Orders         Ordered    oxyCODONE-acetaminophen (PERCOCET) 7.5-325 MG tablet  Every 6 hours PRN        01/20/21 1844    orphenadrine (NORFLEX) 100 MG tablet  2 times daily        01/20/21 1844    methylPREDNISolone (MEDROL DOSEPAK) 4 MG TBPK tablet        01/20/21 1844          *Please note:  Brenda Middleton was evaluated in Emergency Department on 01/20/2021 for the symptoms described in the history of present illness. She was evaluated in the context of the global COVID-19 pandemic, which necessitated consideration that the patient might be at risk for infection with the SARS-CoV-2 virus that causes COVID-19. Institutional protocols and algorithms that pertain to the evaluation of patients at risk for COVID-19 are in a state of rapid change based on information released by regulatory bodies including the CDC and federal and state organizations. These policies and algorithms were followed during the patient's care in the ED.  Some ED evaluations and interventions may be delayed as a result of limited  staffing during and the pandemic.*   Note:  This document was prepared using Dragon voice recognition software and may include unintentional dictation errors.    Sable Feil, PA-C 01/20/21 Elberta Spaniel, MD 01/20/21 1940

## 2021-01-31 ENCOUNTER — Telehealth: Payer: Self-pay | Admitting: Certified Nurse Midwife

## 2021-01-31 ENCOUNTER — Encounter: Payer: Self-pay | Admitting: Certified Nurse Midwife

## 2021-01-31 NOTE — Telephone Encounter (Signed)
Patient called today at 11:50 am stating that she is headed to ER for back related issues and will not be able to make apt for 2:15 today- I have no showed the apt. Pt states she will call back to reschedule when she is able.

## 2021-02-01 ENCOUNTER — Emergency Department
Admission: EM | Admit: 2021-02-01 | Discharge: 2021-02-01 | Disposition: A | Discharge status: Home / Self Care | Payer: Medicaid Other | Attending: Emergency Medicine | Admitting: Emergency Medicine

## 2021-02-01 ENCOUNTER — Other Ambulatory Visit: Payer: Self-pay

## 2021-02-01 DIAGNOSIS — M545 Low back pain, unspecified: Secondary | ICD-10-CM | POA: Diagnosis present

## 2021-02-01 DIAGNOSIS — Z8541 Personal history of malignant neoplasm of cervix uteri: Secondary | ICD-10-CM | POA: Insufficient documentation

## 2021-02-01 DIAGNOSIS — F17210 Nicotine dependence, cigarettes, uncomplicated: Secondary | ICD-10-CM | POA: Insufficient documentation

## 2021-02-01 DIAGNOSIS — G8929 Other chronic pain: Secondary | ICD-10-CM | POA: Diagnosis not present

## 2021-02-01 MED ORDER — PREDNISONE 10 MG (21) PO TBPK: ORAL_TABLET | Freq: Every day | ORAL | 0 refills | Status: AC

## 2021-02-01 MED ORDER — METHOCARBAMOL 500 MG PO TABS: 500.0000 mg | ORAL_TABLET | Freq: Three times a day (TID) | ORAL | 0 refills | Status: AC | PRN

## 2021-02-01 MED ORDER — PREDNISONE 20 MG PO TABS
60.0000 mg | ORAL_TABLET | Freq: Once | ORAL | Status: AC
  Administered 2021-02-01: 60 mg via ORAL
  Filled 2021-02-01: qty 3

## 2021-02-01 MED ORDER — ONDANSETRON 4 MG PO TBDP
4.0000 mg | ORAL_TABLET | Freq: Once | ORAL | Status: DC
  Filled 2021-02-01: qty 1

## 2021-02-01 MED ORDER — METHOCARBAMOL 500 MG PO TABS
750.0000 mg | ORAL_TABLET | Freq: Once | ORAL | Status: AC
  Administered 2021-02-01: 750 mg via ORAL
  Filled 2021-02-01: qty 2

## 2021-02-01 MED ORDER — KETOROLAC TROMETHAMINE 30 MG/ML IJ SOLN
30.0000 mg | Freq: Once | INTRAMUSCULAR | Status: AC
  Administered 2021-02-01: 30 mg via INTRAMUSCULAR
  Filled 2021-02-01: qty 1

## 2021-02-01 MED ORDER — OXYCODONE-ACETAMINOPHEN 5-325 MG PO TABS
1.0000 | ORAL_TABLET | Freq: Once | ORAL | Status: AC
  Administered 2021-02-01: 1 via ORAL
  Filled 2021-02-01: qty 1

## 2021-02-01 NOTE — ED Notes (Signed)
See triage note  Presents with lower back pain  Denies any fall   States she stood up and her back "gave out"  Increased pain with movement  Unable to stand

## 2021-02-01 NOTE — ED Notes (Signed)
Pt assisted to bathroom via wheelchair by PA. Pt sts she was able to take a few steps to use toilet.

## 2021-02-01 NOTE — ED Provider Notes (Signed)
[facility]-EMERGENCY DEPARTMENT  ____________________________________________  Time seen: Approximately 9:54 PM  I have reviewed the triage vital signs and the nursing notes.   HISTORY  Chief Complaint Back Pain   Historian Patient     HPI Brenda Middleton is a 60 y.o. female presents to the emergency department with an exacerbation of chronic low back pain.  Patient states that back pain seems worse than usual for her.  She denies falls or mechanisms of trauma.  No bowel or bladder incontinence or saddle anesthesia.  No dysuria, hematuria or increased urinary frequency.  Patient states that she has a follow-up appointment with [facility] this week to review her recent MRI lumbar spine findings.   Past Medical History:  Diagnosis Date  . Anxiety   . Benign neoplasm of cervix uteri   . Chronic hepatitis C without mention of hepatic coma   . Chronic low back pain 08/26/2014  . Constipation   . Depressive disorder   . Displacement of cervical intervertebral disc   . Hypertensive disorder   . Iron deficiency anemia due to chronic blood loss 09/12/2019  . Palpitations   . Prolapsed cervical intervertebral disc      Immunizations up to date:  Yes.     Past Medical History:  Diagnosis Date  . Anxiety   . Benign neoplasm of cervix uteri   . Chronic hepatitis C without mention of hepatic coma   . Chronic low back pain 08/26/2014  . Constipation   . Depressive disorder   . Displacement of cervical intervertebral disc   . Hypertensive disorder   . Iron deficiency anemia due to chronic blood loss 09/12/2019  . Palpitations   . Prolapsed cervical intervertebral disc     Patient Active Problem List   Diagnosis Date Noted  . Iron deficiency anemia due to chronic blood loss 09/12/2019  . Family history of cancer 09/12/2019  . Blood in the stool 09/12/2019  . Elevated alkaline phosphatase level 09/12/2019  . Chronic low back pain 08/26/2014    Past Surgical History:   Procedure Laterality Date  . CONIZATION CERVIX    . DILATION AND CURETTAGE OF UTERUS    . HAND SURGERY  2008,2015   Carpel Tunnel  . TONSILLECTOMY    . VULVECTOMY      Prior to Admission medications   Medication Sig Start Date End Date Taking? Authorizing Provider  methocarbamol (ROBAXIN) 500 MG tablet Take 1 tablet (500 mg total) by mouth every 8 (eight) hours as needed for up to 5 days. 02/01/21 02/06/21 Yes Vallarie Mare M, PA-C  predniSONE (STERAPRED UNI-PAK 21 TAB) 10 MG (21) TBPK tablet Take by mouth daily for 6 days. 7,1,2,4,5,8 02/01/21 02/07/21 Yes Vallarie Mare M, PA-C  albuterol (PROVENTIL HFA;VENTOLIN HFA) 108 (90 BASE) MCG/ACT inhaler Inhale 2 puffs into the lungs every 4 (four) hours as needed for wheezing or shortness of breath.    [provider]  budesonide-formoterol (SYMBICORT) 80-4.5 MCG/ACT inhaler Inhale 2 puffs into the lungs 2 (two) times daily.    [provider]  busPIRone (BUSPAR) 10 MG tablet Take 10 mg by mouth 2 (two) times daily. Patient is unsure of dose    [provider]  Linaclotide (LINZESS) 145 MCG CAPS capsule Take 1 capsule by mouth daily.    [provider]  lisinopril (PRINIVIL,ZESTRIL) 10 MG tablet Take 10 mg by mouth daily.    [provider]  Meloxicam 10 MG CAPS Take 1 capsule by mouth daily as needed.  [provider]  orphenadrine (NORFLEX) 100 MG tablet Take 1 tablet (100 mg total) by mouth 2 (two) times daily. 01/20/21   Sable Feil, PA-C  oxyCODONE-acetaminophen (PERCOCET) 7.5-325 MG tablet Take 1 tablet by mouth every 6 (six) hours as needed for severe pain. 01/20/21   Sable Feil, PA-C  prochlorperazine (COMPAZINE) 10 MG tablet Take 10 mg by mouth every 6 (six) hours as needed for nausea or vomiting.    [provider]  QUEtiapine (SEROQUEL) 400 MG tablet Take 400 mg by mouth at bedtime.    [provider]  amitriptyline (ELAVIL) 25 MG tablet Take 2 tablets (50 mg  total) by mouth at bedtime. Patient not taking: No sig reported 08/26/14 01/01/21  Kathrynn Ducking, MD  clonazePAM (KLONOPIN) 0.5 MG tablet Take 0.5 mg by mouth 2 (two) times daily.  01/01/21  [provider]  temazepam (RESTORIL) 30 MG capsule Take 30 mg by mouth at bedtime.  01/01/21  [provider]    Allergies Penicillin g, Penicillin v, and Penicillins  Family History  Problem Relation Age of Onset  . Breast cancer Mother 2  . Lung cancer Father   . Cancer Brother   . Cancer Other   . Stroke Other     Social History Social History   Tobacco Use  . Smoking status: Current Every Day Smoker    Packs/day: 0.70    Years: 35.00    Pack years: 24.50    Types: Cigarettes  . Smokeless tobacco: Never Used  Vaping Use  . Vaping Use: Never used  Substance Use Topics  . Alcohol use: Yes    Comment: occasional wine  . Drug use: No     Review of Systems  Constitutional: No fever/chills Eyes:  No discharge ENT: No upper respiratory complaints. Respiratory: no cough. No SOB/ use of accessory muscles to breath Gastrointestinal:   No nausea, no vomiting.  No diarrhea.  No constipation. Musculoskeletal: Patient has low back pain.  Skin: Negative for rash, abrasions, lacerations, ecchymosis.   ____________________________________________   PHYSICAL EXAM:  VITAL SIGNS: ED Triage Vitals  Enc Vitals Group     BP 02/01/21 1823 (!) 156/101     Pulse Rate 02/01/21 1823 97     Resp 02/01/21 1823 17     Temp 02/01/21 1823 98.4 F (36.9 C)     Temp Source 02/01/21 1823 Oral     SpO2 02/01/21 1823 99 %     Weight 02/01/21 1829 120 lb 5.9 oz (54.6 kg)     Height 02/01/21 1829 5\' 6"  (1.676 m)     Head Circumference --      Peak Flow --      Pain Score 02/01/21 1814 10     Pain Loc --      Pain Edu? --      Excl. in [place]? --      Constitutional: Alert and oriented. Well appearing and in no acute distress. Eyes: Conjunctivae are normal. PERRL. EOMI. Head:  Atraumatic. ENT:      Nose: No congestion/rhinnorhea.      Mouth/Throat: Mucous membranes are moist.  Neck: No stridor.  No cervical spine tenderness to palpation. Cardiovascular: Normal rate, regular rhythm. Normal S1 and S2.  Good peripheral circulation. Respiratory: Normal respiratory effort without tachypnea or retractions. Lungs CTAB. Good air entry to the bases with no decreased or absent breath sounds Gastrointestinal: Bowel sounds x 4 quadrants. Soft and nontender to palpation. No guarding or  rigidity. No distention. Musculoskeletal: Full range of motion to all extremities. No obvious deformities noted.  Negative straight leg raise test bilaterally. Neurologic:  Normal for age. No gross focal neurologic deficits are appreciated.  Skin:  Skin is warm, dry and intact. No rash noted. Psychiatric: Mood and affect are normal for age. Speech and behavior are normal.   ____________________________________________   LABS (all labs ordered are listed, but only abnormal results are displayed)  Labs Reviewed - No data to display ____________________________________________  EKG   ____________________________________________  RADIOLOGY Unk Pinto, personally viewed and evaluated these images (plain radiographs) as part of my medical decision making, as well as reviewing the written report by the radiologist.    No results found.  ____________________________________________    PROCEDURES  Procedure(s) performed:     Procedures     Medications  ondansetron (ZOFRAN-ODT) disintegrating tablet 4 mg (4 mg Oral Not Given 02/01/21 1930)  oxyCODONE-acetaminophen (PERCOCET/ROXICET) 5-325 MG per tablet 1 tablet (1 tablet Oral Given 02/01/21 1930)  predniSONE (DELTASONE) tablet 60 mg (60 mg Oral Given 02/01/21 1930)  ketorolac (TORADOL) 30 MG/ML injection 30 mg (30 mg Intramuscular Given 02/01/21 1944)  methocarbamol (ROBAXIN) tablet 750 mg (750 mg Oral Given 02/01/21 1944)      ____________________________________________   INITIAL IMPRESSION / ASSESSMENT AND PLAN / ED COURSE  Pertinent labs & imaging results that were available during my care of the patient were reviewed by me and considered in my medical decision making (see chart for details).      Assessment and plan Low back pain 69-year-old female presents to the emergency department with an exacerbation of chronic low back pain.  Patient was mildly hypertensive at triage but vital signs were otherwise reassuring.  Patient requested Dilaudid in the emergency department as she has been given Dilaudid recently in the past.  Explained to patient that we cannot routinely administer Dilaudid for chronic low back pain.  Patient was given Percocet as well as Toradol and Robaxin.  She reported that her pain was not significantly improved.  Patient was also started on prednisone.  She was advised to keep scheduled appointment with [facility] to discuss recent MRI lumbar spine findings.  Return precautions were given to return with new or worsening symptoms.  All patient questions were answered.     ____________________________________________  FINAL CLINICAL IMPRESSION(S) / ED DIAGNOSES  Final diagnoses:  Acute low back pain, unspecified back pain laterality, unspecified whether sciatica present      NEW MEDICATIONS STARTED DURING THIS VISIT:  ED Discharge Orders         Ordered    predniSONE (STERAPRED UNI-PAK 21 TAB) 10 MG (21) TBPK tablet  Daily        02/01/21 1957    methocarbamol (ROBAXIN) 500 MG tablet  Every 8 hours PRN        02/01/21 1957              This chart was dictated using voice recognition software/Dragon. Despite best efforts to proofread, errors can occur which can change the meaning. Any change was purely unintentional.     Lannie Fields, PA-C 02/01/21 2327    Vanessa [place], MD 02/02/21 [phone]

## 2021-02-01 NOTE — ED Triage Notes (Signed)
Pt come via EMs with c/o lower back pain. Pt states she stood up and her back went out. Pt has hx of scoliosis and sciatic. Pt denies any falls. Pt lowered herself to ground.  VSS per EMS

## 2021-02-09 ENCOUNTER — Telehealth: Payer: Self-pay | Admitting: Certified Nurse Midwife

## 2021-02-09 NOTE — Telephone Encounter (Signed)
Received referral- STAT- I left message for patient- making her aware of next available apt, pt did cancel her last apt- was headed to ED. I did call Referring physician to make them aware of her previous apt and that I left pt message with hopes she will call back top schedule.

## 2021-02-11 ENCOUNTER — Other Ambulatory Visit (HOSPITAL_COMMUNITY)
Admission: RE | Admit: 2021-02-11 | Discharge: 2021-02-11 | Disposition: A | Discharge status: Home / Self Care | Payer: Medicaid Other | Source: Ambulatory Visit | Attending: Certified Nurse Midwife | Admitting: Certified Nurse Midwife

## 2021-02-11 ENCOUNTER — Encounter: Payer: Self-pay | Admitting: Certified Nurse Midwife

## 2021-02-11 ENCOUNTER — Other Ambulatory Visit: Payer: Self-pay

## 2021-02-11 ENCOUNTER — Ambulatory Visit (INDEPENDENT_AMBULATORY_CARE_PROVIDER_SITE_OTHER): Payer: Medicaid Other | Admitting: Certified Nurse Midwife

## 2021-02-11 VITALS — BP 146/86 | HR 109 | Ht 66.0 in | Wt 121.9 lb

## 2021-02-11 DIAGNOSIS — Z9079 Acquired absence of other genital organ(s): Secondary | ICD-10-CM | POA: Insufficient documentation

## 2021-02-11 DIAGNOSIS — Z124 Encounter for screening for malignant neoplasm of cervix: Secondary | ICD-10-CM | POA: Insufficient documentation

## 2021-02-11 DIAGNOSIS — K629 Disease of anus and rectum, unspecified: Secondary | ICD-10-CM | POA: Insufficient documentation

## 2021-02-11 NOTE — Progress Notes (Signed)
Pt PCP referred for cancerous looking lesions in the rectal area.  Pt states [employer] lost her pap and wanting to see if we can do one today.  No pain but some bleeding.

## 2021-02-11 NOTE — Progress Notes (Signed)
GYN ENCOUNTER NOTE  Subjective:       Brenda Middleton is a 60 y.o. G0P0000 female referred by PCP for evaluation of rectal lesions.   Patient has history of skin cancer and three (3) vulvectomy.   Reports Pap being lost by [employer] after collection by PCP, requests recollection today.   Denies chest pain, abdominal pain, vaginal bleeding, and dysuria.    Gynecologic History  No LMP recorded. Patient is postmenopausal.  Contraception: post menopausal status  Last Pap: due, lost by [employer].   Obstetric History  OB History  Gravida Para Term Preterm AB Living  0 0 0 0 0 0  SAB IAB Ectopic Multiple Live Births  0 0 0 0 0    Past Medical History:  Diagnosis Date  . Anxiety   . Benign neoplasm of cervix uteri   . Chronic hepatitis C without mention of hepatic coma   . Chronic low back pain 08/26/2014  . Constipation   . Depressive disorder   . Displacement of cervical intervertebral disc   . Hypertensive disorder   . Iron deficiency anemia due to chronic blood loss 09/12/2019  . Palpitations   . Prolapsed cervical intervertebral disc     Past Surgical History:  Procedure Laterality Date  . CONIZATION CERVIX    . DILATION AND CURETTAGE OF UTERUS    . HAND SURGERY  2008,2015   Carpel Tunnel  . TONSILLECTOMY    . VULVECTOMY      Current Outpatient Medications on File Prior to Visit  Medication Sig Dispense Refill  . albuterol (PROVENTIL HFA;VENTOLIN HFA) 108 (90 BASE) MCG/ACT inhaler Inhale 2 puffs into the lungs every 4 (four) hours as needed for wheezing or shortness of breath.    . budesonide-formoterol (SYMBICORT) 80-4.5 MCG/ACT inhaler Inhale 2 puffs into the lungs 2 (two) times daily.    . busPIRone (BUSPAR) 10 MG tablet Take 10 mg by mouth 2 (two) times daily. Patient is unsure of dose    . Meloxicam 10 MG CAPS Take 1 capsule by mouth daily as needed.    . prochlorperazine (COMPAZINE) 10 MG tablet Take 10 mg by mouth every 6 (six) hours as needed for nausea or  vomiting.    Marland Kitchen QUEtiapine (SEROQUEL) 400 MG tablet Take 400 mg by mouth at bedtime.    . [DISCONTINUED] amitriptyline (ELAVIL) 25 MG tablet Take 2 tablets (50 mg total) by mouth at bedtime. (Patient not taking: No sig reported) 60 tablet 3  . [DISCONTINUED] clonazePAM (KLONOPIN) 0.5 MG tablet Take 0.5 mg by mouth 2 (two) times daily.    . [DISCONTINUED] temazepam (RESTORIL) 30 MG capsule Take 30 mg by mouth at bedtime.     No current facility-administered medications on file prior to visit.    Allergies  Allergen Reactions  . Penicillin G Hives    Other reaction(s): HIVES Other reaction(s): HIVES  . Penicillin V Itching  . Penicillins     Social History   Socioeconomic History  . Marital status: Single    Spouse name: Not on file  . Number of children: 0  . Years of education: college1  . Highest education level: Not on file  Occupational History    Employer: UNEMPLOYED  Tobacco Use  . Smoking status: Current Every Day Smoker    Packs/day: 0.70    Years: 35.00    Pack years: 24.50    Types: Cigarettes  . Smokeless tobacco: Never Used  Vaping Use  . Vaping Use: Never used  Substance and Sexual Activity  . Alcohol use: Yes    Comment: occasional wine  . Drug use: No  . Sexual activity: Not Currently  Other Topics Concern  . Not on file  Social History Narrative  . Not on file   Social Determinants of Health   Financial Resource Strain: Not on file  Food Insecurity: Not on file  Transportation Needs: Not on file  Physical Activity: Not on file  Stress: Not on file  Social Connections: Not on file  Intimate Partner Violence: Not on file    Family History  Problem Relation Age of Onset  . Breast cancer Mother 31  . Lung cancer Father   . Cancer Brother   . Cancer Other   . Stroke Other     The following portions of the patient's history were reviewed and updated as appropriate: allergies, current medications, past family history, past medical history,  past social history, past surgical history and problem list.  Review of Systems  ROS negative except as noted above. Information obtained from patient.   Objective:   BP (!) 146/86   Pulse (!) 109   Ht 5\' 6"  (1.676 m)   Wt 121 lb 14.4 oz (55.3 kg)   BMI 19.68 kg/m    CONSTITUTIONAL: Well-developed, well-nourished female in no acute distress.   PELVIC:  External Genitalia: Skin grafts and atrophy present  Vagina: Atrophic  Cervix: Pap collected  Rectum: Diffuse lesions present  MUSCULOSKELETAL: Decreased range of motion.    Assessment:   1. Screening for cervical cancer  - Cytology - PAP  2. History of vulvectomy  - Surgical pathology  3. Rectal lesion  - Surgical pathology    Plan:   Verbal consent obtained from lesion removal, see note below.   Pap collected, see orders.   Reviewed red flag symptoms and when to call.   RTC as needed.    Brenda Middleton, CNM [facility], [facility] 02/11/21 12:43 PM   Lesion Removal  Brenda Middleton is a 60 y.o. year old Caucasian female consent for lesion removal for pathology sampling.   BP (!) 146/86   Pulse (!) 109   Ht 5\' 6"  (1.676 m)   Wt 121 lb 14.4 oz (55.3 kg)   BMI 19.68 kg/m   Appropriate time out taken.  Area prepped in usual sterile fashon. Lidocaine was used to anesthetize the area at the distal end of the implant. Two lesion were removed for further examination, three (3) o'clock and six (6) o'clock. There was less than 3 cc blood loss. There were no complications.  Monsel's solution applied until sites were hemostatic.   The patient tolerated the procedure well.    Reviewed red flag symptoms and when to call, see AVS.   Samples to pathology, see order. Will contact patient with results.    Brenda Middleton, CNM [facility], [facility] 02/11/21 12:51 PM    Dr. Amalia Middleton consult regarding plan of care during today's visit.

## 2021-02-11 NOTE — Patient Instructions (Signed)
Pap Test Why am I having this test? A Pap test, also called a Pap smear, is a screening test to check for signs of:  Cancer of the vagina, cervix, and uterus. The cervix is the lower part of the uterus that opens into the vagina.  Infection.  Changes that may be a sign that cancer is developing (precancerous changes). Women need this test on a regular basis. In general, you should have a Pap test every 3 years until you reach menopause or age 60. Women aged 30-60 may choose to have their Pap test done at the same time as an HPV (human papillomavirus) test every 5 years (instead of every 3 years). Your health care provider may recommend having Pap tests more or less often depending on your medical conditions and past Pap test results. What kind of sample is taken? Your health care provider will collect a sample of cells from the surface of your cervix. This will be done using a small cotton swab, plastic spatula, or brush. This sample is often collected during a pelvic exam, when you are lying on your back on an exam table with feet in footrests (stirrups). In some cases, fluids (secretions) from the cervix or vagina may also be collected.   How do I prepare for this test?  Be aware of where you are in your menstrual cycle. If you are menstruating on the day of the test, you may be asked to reschedule.  You may need to reschedule if you have a known vaginal infection on the day of the test.  Follow instructions from your health care provider about: ? Changing or stopping your regular medicines. Some medicines can cause abnormal test results, such as digitalis and tetracycline. ? Avoiding douching or taking a bath the day before or the day of the test. Tell a health care provider about:  Any allergies you have.  All medicines you are taking, including vitamins, herbs, eye drops, creams, and over-the-counter medicines.  Any blood disorders you have.  Any surgeries you have had.  Any  medical conditions you have.  Whether you are pregnant or may be pregnant. How are the results reported? Your test results will be reported as either abnormal or normal. A false-positive result can occur. A false positive is incorrect because it means that a condition is present when it is not. A false-negative result can occur. A false negative is incorrect because it means that a condition is not present when it is. What do the results mean? A normal test result means that you do not have signs of cancer of the vagina, cervix, or uterus. An abnormal result may mean that you have:  Cancer. A Pap test by itself is not enough to diagnose cancer. You will have more tests done in this case.  Precancerous changes in your vagina, cervix, or uterus.  Inflammation of the cervix.  An STD (sexually transmitted disease).  A fungal infection.  A parasite infection. Talk with your health care provider about what your results mean. Questions to ask your health care provider Ask your health care provider, or the department that is doing the test:  When will my results be ready?  How will I get my results?  What are my treatment options?  What other tests do I need?  What are my next steps? Summary  In general, women should have a Pap test every 3 years until they reach menopause or age 29.  Your health care provider will collect  a sample of cells from the surface of your cervix. This will be done using a small cotton swab, plastic spatula, or brush.  In some cases, fluids (secretions) from the cervix or vagina may also be collected. This information is not intended to replace advice given to you by your health care provider. Make sure you discuss any questions you have with your health care provider. Document Revised: 07/28/2020 Document Reviewed: 07/23/2020 Elsevier Patient Education  2021 Elsevier Inc.   Preventive Care 60-62 Years Old, Female Preventive care refers to lifestyle  choices and visits with your health care provider that can promote health and wellness. This includes:  A yearly physical exam. This is also called an annual wellness visit.  Regular dental and eye exams.  Immunizations.  Screening for certain conditions.  Healthy lifestyle choices, such as: ? Eating a healthy diet. ? Getting regular exercise. ? Not using drugs or products that contain nicotine and tobacco. ? Limiting alcohol use. What can I expect for my preventive care visit? Physical exam Your health care provider will check your:  Height and weight. These may be used to calculate your BMI (body mass index). BMI is a measurement that tells if you are at a healthy weight.  Heart rate and blood pressure.  Body temperature.  Skin for abnormal spots. Counseling Your health care provider may ask you questions about your:  Past medical problems.  Family's medical history.  Alcohol, tobacco, and drug use.  Emotional well-being.  Home life and relationship well-being.  Sexual activity.  Diet, exercise, and sleep habits.  Work and work Statistician.  Access to firearms.  Method of birth control.  Menstrual cycle.  Pregnancy history. What immunizations do I need? Vaccines are usually given at various ages, according to a schedule. Your health care provider will recommend vaccines for you based on your age, medical history, and lifestyle or other factors, such as travel or where you work.   What tests do I need? Blood tests  Lipid and cholesterol levels. These may be checked every 5 years, or more often if you are over 71 years old.  Hepatitis C test.  Hepatitis B test. Screening  Lung cancer screening. You may have this screening every year starting at age 38 if you have a 30-pack-year history of smoking and currently smoke or have quit within the past 15 years.  Colorectal cancer screening. ? All adults should have this screening starting at age 63 and  continuing until age 39. ? Your health care provider may recommend screening at age 53 if you are at increased risk. ? You will have tests every 1-10 years, depending on your results and the type of screening test.  Diabetes screening. ? This is done by checking your blood sugar (glucose) after you have not eaten for a while (fasting). ? You may have this done every 1-3 years.  Mammogram. ? This may be done every 1-2 years. ? Talk with your health care provider about when you should start having regular mammograms. This may depend on whether you have a family history of breast cancer.  BRCA-related cancer screening. This may be done if you have a family history of breast, ovarian, tubal, or peritoneal cancers.  Pelvic exam and Pap test. ? This may be done every 3 years starting at age 69. ? Starting at age 65, this may be done every 5 years if you have a Pap test in combination with an HPV test. Other tests  STD (sexually transmitted  disease) testing, if you are at risk.  Bone density scan. This is done to screen for osteoporosis. You may have this scan if you are at high risk for osteoporosis. Talk with your health care provider about your test results, treatment options, and if necessary, the need for more tests. Follow these instructions at home: Eating and drinking  Eat a diet that includes fresh fruits and vegetables, whole grains, lean protein, and low-fat dairy products.  Take vitamin and mineral supplements as recommended by your health care provider.  Do not drink alcohol if: ? Your health care provider tells you not to drink. ? You are pregnant, may be pregnant, or are planning to become pregnant.  If you drink alcohol: ? Limit how much you have to 0-1 drink a day. ? Be aware of how much alcohol is in your drink. In the [place] one drink equals one 12 oz bottle of beer (355 mL), one 5 oz glass of wine (148 mL), or one 1 oz glass of hard liquor (44 mL).    Lifestyle  Take daily care of your teeth and gums. Brush your teeth every morning and night with fluoride toothpaste. Floss one time each day.  Stay active. Exercise for at least 30 minutes 5 or more days each week.  Do not use any products that contain nicotine or tobacco, such as cigarettes, e-cigarettes, and chewing tobacco. If you need help quitting, ask your health care provider.  Do not use drugs.  If you are sexually active, practice safe sex. Use a condom or other form of protection to prevent STIs (sexually transmitted infections).  If you do not wish to become pregnant, use a form of birth control. If you plan to become pregnant, see your health care provider for a prepregnancy visit.  If told by your health care provider, take low-dose aspirin daily starting at age 41.  Find healthy ways to cope with stress, such as: ? Meditation, yoga, or listening to music. ? Journaling. ? Talking to a trusted person. ? Spending time with friends and family. Safety  Always wear your seat belt while driving or riding in a vehicle.  Do not drive: ? If you have been drinking alcohol. Do not ride with someone who has been drinking. ? When you are tired or distracted. ? While texting.  Wear a helmet and other protective equipment during sports activities.  If you have firearms in your house, make sure you follow all gun safety procedures. What's next?  Visit your health care provider once a year for an annual wellness visit.  Ask your health care provider how often you should have your eyes and teeth checked.  Stay up to date on all vaccines. This information is not intended to replace advice given to you by your health care provider. Make sure you discuss any questions you have with your health care provider. Document Revised: 08/24/2020 Document Reviewed: 08/01/2018 Elsevier Patient Education  2021 [employer].   Skin Biopsy A skin biopsy is a procedure to remove a sample  of your skin (lesion) so that it can be checked under a microscope. You may need a skin biopsy if you have a skin disease or abnormal changes in your skin. Tell a health care provider about:  Any allergies you have.  All medicines you are taking, including vitamins, herbs, eye drops, creams, and over-the-counter medicines.  Any problems you or family members have had with anesthetic medicines.  Any blood disorders you have.  Any  surgeries you have had.  Any medical conditions you have or have had.  Whether you are pregnant or may be pregnant. What are the risks? Generally, this is a safe procedure. However, problems may occur, including:  Infection.  Bleeding.  Allergic reaction to medicines.  Scarring. What happens before the procedure?  Ask your health care provider about: ? Changing or stopping your regular medicines. This is especially important if you are taking blood thinners. ? Taking medicines such as aspirin and ibuprofen. These medicines can thin your blood. Do not take these medicines unless your health care provider tells you to take them. ? Taking over-the-counter medicines, vitamins, herbs, and supplements.  Ask your health care provider if you will need someone to take you home from the hospital or clinic after the procedure.  Ask your health care provider how your biopsy site will be marked or identified.  Ask your health care provider what steps will be taken to help prevent infection. These may include: ? Removing hair at the surgery site. ? Washing skin with a germ-killing soap. ? Taking antibiotic medicine. What happens during the procedure?  You may be given medicine to numb the area (local anesthetic).  Your health care provider will take a sample using one of these steps, depending on the type of skin problem that you have: ? Shave biopsy. Your health care provider will shave away layers of your skin lesion with a sharp blade. After shaving, a gel  or ointment may be used to control bleeding. ? Punch biopsy. Your health care provider will use a tool to remove all or part of the lesion. This leaves a small hole about the width of a pencil eraser. The area may be covered with a gel or ointment. ? Excisional or incisional biopsy. Your health care provider will use a surgical blade to remove all or part of your lesion.  Your skin biopsy site may be closed with stitches (sutures).  A bandage (dressing) will be applied. The procedure may vary among health care providers and hospitals.   What happens after the procedure?  Your skin sample will be sent to a lab for tests.  Your skin biopsy site will be watched to make sure that it stops bleeding.  You will be given instructions on how to care for your biopsy site.  It is up to you to get the results of your procedure. Ask your health care provider, or the department that is doing the procedure, when your results will be ready. Summary  A skin biopsy is a procedure to remove a sample of your skin (lesion) so that it can be checked under a microscope.  Tell a health care provider about your medical history and all medicines you are taking, including vitamins, herbs, eye drops, creams, and over-the-counter medicines.  Before the procedure, ask your health care provider about changing or stopping your regular medicines.  During the procedure, your health care provider will take a skin sample from the area where you have the skin problem.  After the procedure, your skin sample will be sent to a laboratory for testing. This information is not intended to replace advice given to you by your health care provider. Make sure you discuss any questions you have with your health care provider. Document Revised: 05/20/2018 Document Reviewed: 05/20/2018 Elsevier Patient Education  [employer].

## 2021-02-12 ENCOUNTER — Encounter: Payer: Self-pay | Admitting: Emergency Medicine

## 2021-02-12 ENCOUNTER — Other Ambulatory Visit: Payer: Self-pay

## 2021-02-12 ENCOUNTER — Ambulatory Visit
Admission: EM | Admit: 2021-02-12 | Discharge: 2021-02-12 | Disposition: A | Discharge status: Home / Self Care | Payer: Medicaid Other | Attending: Family Medicine | Admitting: Family Medicine

## 2021-02-12 DIAGNOSIS — H66002 Acute suppurative otitis media without spontaneous rupture of ear drum, left ear: Secondary | ICD-10-CM | POA: Diagnosis not present

## 2021-02-12 MED ORDER — DOXYCYCLINE HYCLATE 100 MG PO CAPS: 100.0000 mg | ORAL_CAPSULE | Freq: Two times a day (BID) | ORAL | 0 refills | Status: DC

## 2021-02-12 NOTE — Discharge Instructions (Signed)
Medication as prescribed.  Please contact your primary care physician.  She needs to send you to ENT.  Take care  Dr. Lacinda Axon

## 2021-02-12 NOTE — ED Provider Notes (Signed)
[facility]    CSN: [id number] Arrival date & time: 02/12/21  1050      History   Chief Complaint Chief Complaint  Patient presents with  . Headache  . Facial Pain   HPI  60 year old female presents with the above complaints.  Patient reports that she has had ongoing symptoms for the past 2 weeks.  She reports sinus pain and pressure.  Associated headache.  She also reports left ear pain.  She recently saw her primary earlier this week and was placed on azithromycin for sinusitis.  She has penicillin allergy.  She states that she is also had ongoing hoarseness which she has had for 2 months.  She states that her primary is arranging further work-up regarding this.  She is a smoker.  She rates her pain is 10/10 in severity.  No documented fever.  No other complaints or concerns at this time.  Past Medical History:  Diagnosis Date  . Anxiety   . Benign neoplasm of cervix uteri   . Chronic hepatitis C without mention of hepatic coma   . Chronic low back pain 08/26/2014  . Constipation   . Depressive disorder   . Displacement of cervical intervertebral disc   . Hypertensive disorder   . Iron deficiency anemia due to chronic blood loss 09/12/2019  . Palpitations   . Prolapsed cervical intervertebral disc     Patient Active Problem List   Diagnosis Date Noted  . Iron deficiency anemia due to chronic blood loss 09/12/2019  . Family history of cancer 09/12/2019  . Blood in the stool 09/12/2019  . Elevated alkaline phosphatase level 09/12/2019  . Chronic low back pain 08/26/2014    Past Surgical History:  Procedure Laterality Date  . CONIZATION CERVIX    . DILATION AND CURETTAGE OF UTERUS    . HAND SURGERY  2008,2015   Carpel Tunnel  . TONSILLECTOMY    . VULVECTOMY      OB History    Gravida  0   Para  0   Term  0   Preterm  0   AB  0   Living  0     SAB  0   IAB  0   Ectopic  0   Multiple  0   Live Births  0            Home  Medications    Prior to Admission medications   Medication Sig Start Date End Date Taking? Authorizing Provider  albuterol (PROVENTIL HFA;VENTOLIN HFA) 108 (90 BASE) MCG/ACT inhaler Inhale 2 puffs into the lungs every 4 (four) hours as needed for wheezing or shortness of breath.   Yes [provider]  budesonide-formoterol (SYMBICORT) 80-4.5 MCG/ACT inhaler Inhale 2 puffs into the lungs 2 (two) times daily.   Yes [provider]  busPIRone (BUSPAR) 10 MG tablet Take 10 mg by mouth 2 (two) times daily. Patient is unsure of dose   Yes [provider]  doxycycline (VIBRAMYCIN) 100 MG capsule Take 1 capsule (100 mg total) by mouth 2 (two) times daily. 02/12/21  Yes Cook, Jayce G, DO  gabapentin (NEURONTIN) 300 MG capsule Take 300 mg by mouth daily. 02/04/21  Yes [provider]  hydrOXYzine (ATARAX/VISTARIL) 25 MG tablet hydroxyzine HCl 25 mg tablet   Yes [provider]  Meloxicam 10 MG CAPS Take 1 capsule by mouth daily as needed.   Yes [provider]  pantoprazole (PROTONIX) 40 MG tablet Take 40 mg  by mouth daily. 02/08/21  Yes [provider]  prochlorperazine (COMPAZINE) 10 MG tablet Take 10 mg by mouth every 6 (six) hours as needed for nausea or vomiting.   Yes [provider]  QUEtiapine (SEROQUEL) 400 MG tablet Take 400 mg by mouth at bedtime.   Yes [provider]  amitriptyline (ELAVIL) 25 MG tablet Take 2 tablets (50 mg total) by mouth at bedtime. Patient not taking: No sig reported 08/26/14 01/01/21  Kathrynn Ducking, MD  clonazePAM (KLONOPIN) 0.5 MG tablet Take 0.5 mg by mouth 2 (two) times daily.  01/01/21  [provider]  temazepam (RESTORIL) 30 MG capsule Take 30 mg by mouth at bedtime.  01/01/21  [provider]    Family History Family History  Problem Relation Age of Onset  . Breast cancer Mother 68  . Lung cancer Father   . Cancer Brother   . Cancer Other   . Stroke Other      Social History Social History   Tobacco Use  . Smoking status: Current Every Day Smoker    Packs/day: 0.70    Years: 35.00    Pack years: 24.50    Types: Cigarettes  . Smokeless tobacco: Never Used  Vaping Use  . Vaping Use: Never used  Substance Use Topics  . Alcohol use: Yes    Comment: occasional wine  . Drug use: No     Allergies   Penicillin g, Penicillin v, and Penicillins   Review of Systems Review of Systems Per HPI  Physical Exam Triage Vital Signs ED Triage Vitals  Enc Vitals Group     BP 02/12/21 1102 139/81     Pulse Rate 02/12/21 1102 96     Resp 02/12/21 1102 14     Temp 02/12/21 1102 98.3 F (36.8 C)     Temp Source 02/12/21 1102 Oral     SpO2 02/12/21 1102 100 %     Weight 02/12/21 1058 120 lb (54.4 kg)     Height 02/12/21 1058 5\' 6"  (1.676 m)     Head Circumference --      Peak Flow --      Pain Score 02/12/21 1058 10     Pain Loc --      Pain Edu? --      Excl. in [place]? --    Updated Vital Signs BP 139/81 (BP Location: Right Arm)   Pulse 96   Temp 98.3 F (36.8 C) (Oral)   Resp 14   Ht 5\' 6"  (1.676 m)   Wt 54.4 kg   SpO2 100%   BMI 19.37 kg/m   Visual Acuity Right Eye Distance:   Left Eye Distance:   Bilateral Distance:    Right Eye Near:   Left Eye Near:    Bilateral Near:     Physical Exam Constitutional:      General: She is not in acute distress.    Comments: Chronically ill-appearing.  HENT:     Head: Normocephalic and atraumatic.     Right Ear: Tympanic membrane normal.     Ears:     Comments: Left TM with effusion, erythema.  There appears to be blood behind the TM.    Nose: Nose normal.     Mouth/Throat:     Pharynx: Oropharynx is clear. No oropharyngeal exudate or posterior oropharyngeal erythema.  Eyes:     General:        Right eye: No discharge.  Left eye: No discharge.     Conjunctiva/sclera: Conjunctivae normal.  Cardiovascular:     Rate and Rhythm: Normal rate and regular rhythm.   Pulmonary:     Effort: Pulmonary effort is normal.     Breath sounds: No wheezing or rales.  Musculoskeletal:     Cervical back: Neck supple.  Lymphadenopathy:     Cervical: Cervical adenopathy present.  Neurological:     Mental Status: She is alert.    UC Treatments / Results  Labs (all labs ordered are listed, but only abnormal results are displayed) Labs Reviewed - No data to display  EKG   Radiology No results found.  Procedures Procedures (including critical care time)  Medications Ordered in UC Medications - No data to display  Initial Impression / Assessment and Plan / UC Course  I have reviewed the triage vital signs and the nursing notes.  Pertinent labs & imaging results that were available during my care of the patient were reviewed by me and considered in my medical decision making (see chart for details).    60 year old female presents with otitis media. Treating with Doxycycline given severe penicillin allergy.  Regarding her ongoing hoarseness, I advised her to reconnect with her primary care provider and discuss urgent referral to ENT.  Final Clinical Impressions(s) / UC Diagnoses   Final diagnoses:  Acute suppurative otitis media of left ear without spontaneous rupture of tympanic membrane, recurrence not specified     Discharge Instructions     Medication as prescribed.  Please contact your primary care physician.  She needs to send you to ENT.  Take care  Dr. Lacinda Axon   ED Prescriptions    Medication Sig Dispense Auth. Provider   doxycycline (VIBRAMYCIN) 100 MG capsule Take 1 capsule (100 mg total) by mouth 2 (two) times daily. 20 capsule Coral Spikes, DO     PDMP not reviewed this encounter.   Coral Spikes, DO 02/12/21 1226

## 2021-02-12 NOTE — ED Triage Notes (Signed)
Patient c/o sinus pressure and headache that started several days ago.  Patient denies any fevers.

## 2021-02-13 ENCOUNTER — Telehealth: Payer: Self-pay | Admitting: Family Medicine

## 2021-02-13 MED ORDER — KETOROLAC TROMETHAMINE 10 MG PO TABS: 10.0000 mg | ORAL_TABLET | Freq: Four times a day (QID) | ORAL | 0 refills | Status: DC | PRN

## 2021-02-13 NOTE — Telephone Encounter (Signed)
Pain medication sent to pharmacy.  [facility]

## 2021-02-15 ENCOUNTER — Emergency Department: Payer: Medicaid Other

## 2021-02-15 ENCOUNTER — Emergency Department
Admission: EM | Admit: 2021-02-15 | Discharge: 2021-02-15 | Disposition: A | Discharge status: Home / Self Care | Payer: Medicaid Other | Attending: Emergency Medicine | Admitting: Emergency Medicine

## 2021-02-15 ENCOUNTER — Encounter: Payer: Self-pay | Admitting: Emergency Medicine

## 2021-02-15 ENCOUNTER — Other Ambulatory Visit: Payer: Self-pay

## 2021-02-15 DIAGNOSIS — Z8541 Personal history of malignant neoplasm of cervix uteri: Secondary | ICD-10-CM | POA: Insufficient documentation

## 2021-02-15 DIAGNOSIS — Z85828 Personal history of other malignant neoplasm of skin: Secondary | ICD-10-CM | POA: Diagnosis not present

## 2021-02-15 DIAGNOSIS — C3490 Malignant neoplasm of unspecified part of unspecified bronchus or lung: Secondary | ICD-10-CM

## 2021-02-15 DIAGNOSIS — F17210 Nicotine dependence, cigarettes, uncomplicated: Secondary | ICD-10-CM | POA: Insufficient documentation

## 2021-02-15 DIAGNOSIS — G9389 Other specified disorders of brain: Secondary | ICD-10-CM | POA: Diagnosis not present

## 2021-02-15 DIAGNOSIS — J040 Acute laryngitis: Secondary | ICD-10-CM | POA: Diagnosis not present

## 2021-02-15 DIAGNOSIS — R519 Headache, unspecified: Secondary | ICD-10-CM | POA: Diagnosis present

## 2021-02-15 DIAGNOSIS — C7931 Secondary malignant neoplasm of brain: Secondary | ICD-10-CM | POA: Diagnosis not present

## 2021-02-15 LAB — COMPREHENSIVE METABOLIC PANEL
ALT: 35 U/L (ref 0–44)
AST: 32 U/L (ref 15–41)
Albumin: 3.3 g/dL — ABNORMAL LOW (ref 3.5–5.0)
Alkaline Phosphatase: 224 U/L — ABNORMAL HIGH (ref 38–126)
Anion gap: 7 (ref 5–15)
BUN: 28 mg/dL — ABNORMAL HIGH (ref 6–20)
CO2: 27 mmol/L (ref 22–32)
Calcium: 8.9 mg/dL (ref 8.9–10.3)
Chloride: 108 mmol/L (ref 98–111)
Creatinine, Ser: 0.91 mg/dL (ref 0.44–1.00)
GFR, Estimated: >60 mL/min (ref >60)
Glucose, Bld: 97 mg/dL (ref 70–99)
Potassium: 4 mmol/L (ref 3.5–5.1)
Sodium: 142 mmol/L (ref 135–145)
Total Bilirubin: 0.5 mg/dL (ref 0.3–1.2)
Total Protein: 6.7 g/dL (ref 6.5–8.1)

## 2021-02-15 LAB — CBC WITH DIFFERENTIAL/PLATELET
Abs Immature Granulocytes: 0.06 10*3/uL (ref 0.00–0.07)
Basophils Absolute: 0 10*3/uL (ref 0.0–0.1)
Basophils Relative: 0 %
Eosinophils Absolute: 0.5 10*3/uL (ref 0.0–0.5)
Eosinophils Relative: 4 %
HCT: 34.2 % — ABNORMAL LOW (ref 36.0–46.0)
Hemoglobin: 11.3 g/dL — ABNORMAL LOW (ref 12.0–15.0)
Immature Granulocytes: 1 %
Lymphocytes Relative: 16 %
Lymphs Abs: 1.9 10*3/uL (ref 0.7–4.0)
MCH: 28.8 pg (ref 26.0–34.0)
MCHC: 33 g/dL (ref 30.0–36.0)
MCV: 87.2 fL (ref 80.0–100.0)
Monocytes Absolute: 0.8 10*3/uL (ref 0.1–1.0)
Monocytes Relative: 7 %
Neutro Abs: 8.8 10*3/uL — ABNORMAL HIGH (ref 1.7–7.7)
Neutrophils Relative %: 72 %
Platelets: 129 10*3/uL — ABNORMAL LOW (ref 150–400)
RBC: 3.92 MIL/uL (ref 3.87–5.11)
RDW: 15.1 % (ref 11.5–15.5)
WBC: 12.1 10*3/uL — ABNORMAL HIGH (ref 4.0–10.5)
nRBC: 0 % (ref 0.0–0.2)

## 2021-02-15 LAB — SURGICAL PATHOLOGY

## 2021-02-15 MED ORDER — MORPHINE SULFATE (PF) 4 MG/ML IV SOLN
4.0000 mg | Freq: Once | INTRAVENOUS | Status: AC
  Administered 2021-02-15: 4 mg via INTRAVENOUS
  Filled 2021-02-15: qty 1

## 2021-02-15 MED ORDER — ONDANSETRON HCL 4 MG/2ML IJ SOLN: 4.0000 mg | Freq: Once | INTRAMUSCULAR | Status: AC

## 2021-02-15 MED ORDER — GADOBUTROL 1 MMOL/ML IV SOLN
5.0000 mL | Freq: Once | INTRAVENOUS | Status: AC | PRN
  Administered 2021-02-15: 5 mL via INTRAVENOUS
  Filled 2021-02-15: qty 6

## 2021-02-15 MED ORDER — ONDANSETRON HCL 4 MG/2ML IJ SOLN
4.0000 mg | Freq: Once | INTRAMUSCULAR | Status: AC
  Administered 2021-02-15: 4 mg via INTRAVENOUS
  Filled 2021-02-15: qty 2

## 2021-02-15 MED ORDER — ONDANSETRON HCL 4 MG/2ML IJ SOLN
INTRAMUSCULAR | Status: AC
  Administered 2021-02-15: 4 mg via INTRAVENOUS
  Filled 2021-02-15: qty 2

## 2021-02-15 MED ORDER — OXYCODONE-ACETAMINOPHEN 5-325 MG PO TABS: 1.0000 | ORAL_TABLET | ORAL | 0 refills | Status: DC | PRN

## 2021-02-15 MED ORDER — ONDANSETRON 4 MG PO TBDP: 4.0000 mg | ORAL_TABLET | Freq: Three times a day (TID) | ORAL | 0 refills | Status: DC | PRN

## 2021-02-15 MED ORDER — IOHEXOL 300 MG/ML  SOLN
75.0000 mL | Freq: Once | INTRAMUSCULAR | Status: AC | PRN
  Administered 2021-02-15: 75 mL via INTRAVENOUS
  Filled 2021-02-15: qty 75

## 2021-02-15 NOTE — Discharge Instructions (Addendum)
Take pain medication as needed.  Finish your antibiotic.  Return emergency department worsening. If you have not heard from the cancer center violates my afternoon, please call them for an appointment

## 2021-02-15 NOTE — Consult Note (Signed)
Neurosurgery-New Consultation Evaluation 02/15/2021 Brenda Middleton [id number]  Identifying Statement: Brenda Middleton is a 60 y.o. female from [place] [address] with abnormal brain MRI  Physician Requesting Consultation: [facility]  History of Present Illness: Ms Brenda Middleton is here for evaluation of an abnormal brain MRI and CT scan of the head.  These were performed as the patient has been dealing with difficulty with hearing and pain in the left ear.  She states she has been treated for possible infection but this has not cleared it up.  She denies any history of severe headaches, speech difficulty, vision changes, weakness, or numbness.  She does not note any obvious coordination difficulty.  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  She is a chronic tobacco user.  MRI of the brain and CT does reveal an erosive lesion in the suboccipital bone that does extend intradural.  CT scan of the body also revealed widespread appearance of metastatic disease.  Past Medical History:  Past Medical History:  Diagnosis Date  . Anxiety   . Benign neoplasm of cervix uteri   . Chronic hepatitis C without mention of hepatic coma   . Chronic low back pain 08/26/2014  . Constipation   . Depressive disorder   . Displacement of cervical intervertebral disc   . Hypertensive disorder   . Iron deficiency anemia due to chronic blood loss 09/12/2019  . Palpitations   . Prolapsed cervical intervertebral disc     Social History: Social History   Socioeconomic History  . Marital status: Single    Spouse name: Not on file  . Number of children: 0  . Years of education: college1  . Highest education level: Not on file  Occupational History    Employer: UNEMPLOYED  Tobacco Use  . Smoking status: Current Every Day Smoker    Packs/day: 0.70    Years: 35.00    Pack years: 24.50    Types: Cigarettes  . Smokeless tobacco: Never Used   Vaping Use  . Vaping Use: Never used  Substance and Sexual Activity  . Alcohol use: Yes    Comment: occasional wine  . Drug use: No  . Sexual activity: Not Currently  Other Topics Concern  . Not on file  Social History Narrative  . Not on file   Social Determinants of Health   Financial Resource Strain: Not on file  Food Insecurity: Not on file  Transportation Needs: Not on file  Physical Activity: Not on file  Stress: Not on file  Social Connections: Not on file  Intimate Partner Violence: Not on file   Family History: Family History  Problem Relation Age of Onset  . Breast cancer Mother 69  . Lung cancer Father   . Cancer Brother   . Cancer Other   . Stroke Other     Review of Systems:  Review of Systems - General ROS: Negative Psychological ROS: Negative Ophthalmic ROS: Negative ENT ROS: Positive for hearing difficulty in the left side Hematological and Lymphatic ROS: Negative  Endocrine ROS: Negative Respiratory ROS: Negative Cardiovascular ROS: Negative Gastrointestinal ROS: Negative Genito-Urinary ROS: Negative Musculoskeletal ROS: Negative Neurological ROS: Negative for headaches, numbness, weakness Dermatological ROS: Negative  Physical Exam: BP 132/78 (BP Location: Right Arm)   Pulse 80   Temp 98.4 F (36.9 C) (Oral)   Resp 18   Ht 5\' 6"  (1.676 m)   Wt 54.4 kg   SpO2 99%  BMI 19.36 kg/m  Body mass index is 19.36 kg/m. Body surface area is 1.59 meters squared. General appearance: Alert, cooperative, in no acute distress Head: Normocephalic, tenderness noted along the posterior side Eyes: Normal, EOM intact Oropharynx: Moist without lesions Neck: Supple, range of motion appears full Ext: No edema in LE bilaterally  Neurologic exam:  Mental status: alertness: alert, orientation: person, place, time, affect: normal Speech: fluent and clear, naming and repetition are intact Cranial nerves:  II: Visual fields are full by confrontation, no  ptosis III/IV/VI: extra-ocular motions intact bilaterally V/VII:no evidence of facial droop or weakness  XI: trapezius strength symmetric,  sternocleidomastoid strength symmetric XII: tongue strength symmetric  Motor:strength symmetric 5/5, normal muscle mass and tone in all extremities and no pronator drift Sensory: intact to light touch in all extremities Coordination: Dysmetria noted on the left finger-to-nose testing Gait: normal   Laboratory: Results for orders placed or performed during the hospital encounter of 02/15/21  CBC with Differential  Result Value Ref Range   WBC 12.1 (H) 4.0 - 10.5 K/uL   RBC 3.92 3.87 - 5.11 MIL/uL   Hemoglobin 11.3 (L) 12.0 - 15.0 g/dL   HCT 34.2 (L) 36.0 - 46.0 %   MCV 87.2 80.0 - 100.0 fL   MCH 28.8 26.0 - 34.0 pg   MCHC 33.0 30.0 - 36.0 g/dL   RDW 15.1 11.5 - 15.5 %   Platelets 129 (L) 150 - 400 K/uL   nRBC 0.0 0.0 - 0.2 %   Neutrophils Relative % 72 %   Neutro Abs 8.8 (H) 1.7 - 7.7 K/uL   Lymphocytes Relative 16 %   Lymphs Abs 1.9 0.7 - 4.0 K/uL   Monocytes Relative 7 %   Monocytes Absolute 0.8 0.1 - 1.0 K/uL   Eosinophils Relative 4 %   Eosinophils Absolute 0.5 0.0 - 0.5 K/uL   Basophils Relative 0 %   Basophils Absolute 0.0 0.0 - 0.1 K/uL   Immature Granulocytes 1 %   Abs Immature Granulocytes 0.06 0.00 - 0.07 K/uL  Comprehensive metabolic panel  Result Value Ref Range   Sodium 142 135 - 145 mmol/L   Potassium 4.0 3.5 - 5.1 mmol/L   Chloride 108 98 - 111 mmol/L   CO2 27 22 - 32 mmol/L   Glucose, Bld 97 70 - 99 mg/dL   BUN 28 (H) 6 - 20 mg/dL   Creatinine, Ser 0.91 0.44 - 1.00 mg/dL   Calcium 8.9 8.9 - 10.3 mg/dL   Total Protein 6.7 6.5 - 8.1 g/dL   Albumin 3.3 (L) 3.5 - 5.0 g/dL   AST 32 15 - 41 U/L   ALT 35 0 - 44 U/L   Alkaline Phosphatase 224 (H) 38 - 126 U/L   Total Bilirubin 0.5 0.3 - 1.2 mg/dL   GFR, Estimated >60 >60 mL/min   Anion gap 7 5 - 15   I personally reviewed labs  Imaging: Results for orders placed  during the hospital encounter of 02/15/21  MR Brain W and Wo Contrast  Narrative CLINICAL DATA:  Abnormal head CT.  Skull base mass.  EXAM: MRI HEAD WITHOUT AND WITH CONTRAST  TECHNIQUE: Multiplanar, multiecho pulse sequences of the brain and surrounding structures were obtained without and with intravenous contrast.  CONTRAST:  88mL GADAVIST GADOBUTROL 1 MMOL/ML IV SOLN  COMPARISON:  CT head 02/15/2021  FINDINGS: Brain: Large destructive mass in the posterior skull base bilaterally left greater than right. This involves the occipital bone with extension into the  left temporal bone. The occipital bone is significantly expanded due to tumor on the left with mass-effect on the left cerebellum. Soft tissue mass associated with left occipital lesion measures approximately 5.2 x 3.0 cm. Mild edema in the left cerebellum. There is infiltrating tumor in the left temporal bone which is more sizable than would be predicted from the CT. The soft tissue mass associated with the left occipital bone shows susceptibility which may be due to mineralization and/or bone fragments from bone destruction.  Ventricle size normal. No midline shift. Moderate white matter changes likely due to chronic microvascular ischemia. No acute infarct. Chronic microhemorrhage in the left parietal lobe.  Subtle enhancing lesions in the brain in the right frontal lobe measuring approximately 10 mm, and 4 mm best seen on coronal and sagittal postcontrast imaging. This is suspicious for metastatic disease.  Vascular: Normal arterial flow voids. Normal venous enhancement. No evidence of venous sinus thrombosis.  Skull and upper cervical spine: Infiltrating destructive mass in the occipital bone bilaterally left greater than right with extension into the left temporal bone. Additional lesions in the frontal bone bilaterally and in the left parietal bone likely metastatic disease.  Sinuses/Orbits: Mild mucosal  edema paranasal sinuses. Negative orbit  Other: None  IMPRESSION: Destructive mass lesion in the occipital bone bilaterally left greater than right. There is associated soft tissue mass in the left occipital bone extending into the posterior fossa with mass-effect and mild edema of the left cerebellum. This mass extends into the left temporal bone. Additional bone lesions are present in the frontal bones and left parietal bone.  Subtle enhancing lesions right frontal lobe measuring 10 mm and 4 mm. Findings compatible with metastatic disease.   Electronically Signed By: Franchot Gallo M.D. On: 02/15/2021 13:47  I personally reviewed radiology studies with the patient   Impression/Plan:  Ms. Spalla is here for evaluation of hearing changes and was found to have a skull base mass that was concerning for metastatic process.  Her body imaging does show also multiple lesions in the lung, liver and bone.  Her exam does show some coordination difficulty on the left but given the widespread disease, I do not think a surgery at this time on the posterior fossa carries more benefit than risk.  I do recommend biopsy of the body lesion to obtain a definitive diagnosis.  If this is radiosensitive, I would recommend radiation as a first-line treatment as well as treatment of her systemic disease.  Should the patient develop any worsening symptoms or the mass should enlarge despite this treatment, we could consider a surgery in the future but this would be a large resection and reconstruction.   1.  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  Plan -No surgery recommended at this time.  Recommend proceeding with biopsy of the body lesion and proceed with treatment as directed by the oncologist.

## 2021-02-15 NOTE — ED Triage Notes (Signed)
Patient reports laryngitis x 2 months and bilateral ear pain x 1 week.  Patient states she has been on antibiotics x 6 days with no improvement.  Patient reports difficulty sleeping.  Patient states she was told there was blood in her ears but her PCP could not tell her why.  Patient states she was supposed to get a CT scan, but it was never scheduled.  Patient states she was supposed to go to an ENT, but her PCP would not refer her.  Patient states, "I think I need to be admitted for testing.  Patient is alert and oriented x 4.  Skin is warm and dry.  Patient denies vomiting, diarrhea and fevers.

## 2021-02-15 NOTE — ED Notes (Signed)
See triage note  Presents with bilateral ear pain  States pain started about 1-2 months ago  Has been on antibiotics for a week  States she has noticed some bleeding from ears  States she was to have u/s and ct scan  Still waiting

## 2021-02-15 NOTE — ED Provider Notes (Signed)
-----------------------------------------   11:43 AM on 02/15/2021 -----------------------------------------  Patient seen in conjunction with physician assistant Ashok Cordia.  Unfortunately the patient CT scan shows what appears to be an aggressive 5.5 cm mass lesion involving a left occipital skull causing a mass-effect on the cerebellum.  In speaking to the patient she has a history of skin cancer of her vulva states she has had 3 vulvectomy's including 1 recently.  Suspect possible metastatic disease.  Long smoking history as well we will obtain chest x-ray as a precaution.  Lab work largely [place].  We will proceed with MR with and without to further evaluate.  We will discuss with neurosurgery and oncology.  Reassuringly patient denies any weakness or numbness of any arm or leg confusion or slurred speech.    I spoke to Dr. Janese Banks of oncology.  She is reviewing the patient's information and states she will call her to set up an appointment this week in the office for continued work-up.  She is requesting that we obtain CT imaging of the chest abdomen and pelvis with contrast to evaluate for possible primary lesion.  MRI of the brain unfortunately shows fairly significant tumor along with several other small areas consistent with metastatic spread.  Spoke to Dr. Lacinda Axon of neurosurgery, he is going to come down after he finishes up in clinic to speak to the patient regarding intervention and follow-up.  States this will likely require a surgery.  The plan at this time as the patient appears stable and overall well from a clinical standpoint is to discharge after she is spoken to neurosurgery and we have CT results back that do not show any acutely concerning finding, Dr. Janese Banks will call to arrange follow-up this week.   Harvest Dark, MD 02/15/21 1427

## 2021-02-15 NOTE — ED Provider Notes (Signed)
[facility] Emergency Department Provider Note  ____________________________________________   Event Date/Time   First MD Initiated Contact with Patient 02/15/21 1003     (approximate)  I have reviewed the triage vital signs and the nursing notes.   HISTORY  Chief Complaint Ear Pain and Laryngitis    HPI Brenda Middleton is a 60 y.o. female presents emergency department with complaints of left ear pain for approximately 1 month along with swollen glands and laryngitis.  Patient states she saw her pcp multiple times.-Placed on Zithromax 500 mg and then on doxycycline.  States pain continues to get worse.  States she was told there is blood in her ears.  She was given polymyxin otic drops.  She states none of this has done any good.  Feel that I am getting worse.  Feel that otitis is worse and glands are more swollen.  Unsure if fever or chills.  She stopped using her inhaler because she is afraid that is causing the laryngitis.  Complaining of severe headaches.   Patient has a history of skin cancer and has had several vulvectomy lesions removed, cervical cancer.  Patient is a smoker   Past Medical History:  Diagnosis Date  . Anxiety   . Benign neoplasm of cervix uteri   . Chronic hepatitis C without mention of hepatic coma   . Chronic low back pain 08/26/2014  . Constipation   . Depressive disorder   . Displacement of cervical intervertebral disc   . Hypertensive disorder   . Iron deficiency anemia due to chronic blood loss 09/12/2019  . Palpitations   . Prolapsed cervical intervertebral disc     Patient Active Problem List   Diagnosis Date Noted  . Iron deficiency anemia due to chronic blood loss 09/12/2019  . Family history of cancer 09/12/2019  . Blood in the stool 09/12/2019  . Elevated alkaline phosphatase level 09/12/2019  . Chronic low back pain 08/26/2014    Past Surgical History:  Procedure Laterality Date  . CONIZATION CERVIX    .  DILATION AND CURETTAGE OF UTERUS    . HAND SURGERY  2008,2015   Carpel Tunnel  . TONSILLECTOMY    . VULVECTOMY      Prior to Admission medications   Medication Sig Start Date End Date Taking? Authorizing Provider  ondansetron (ZOFRAN-ODT) 4 MG disintegrating tablet Take 1 tablet (4 mg total) by mouth every 8 (eight) hours as needed. 02/15/21  Yes Fisher, Linden Dolin, PA-C  oxyCODONE-acetaminophen (PERCOCET) 5-325 MG tablet Take 1 tablet by mouth every 4 (four) hours as needed for severe pain. 02/15/21 02/15/22 Yes Fisher, Linden Dolin, PA-C  albuterol (PROVENTIL HFA;VENTOLIN HFA) 108 (90 BASE) MCG/ACT inhaler Inhale 2 puffs into the lungs every 4 (four) hours as needed for wheezing or shortness of breath.    [provider]  budesonide-formoterol (SYMBICORT) 80-4.5 MCG/ACT inhaler Inhale 2 puffs into the lungs 2 (two) times daily.    [provider]  busPIRone (BUSPAR) 10 MG tablet Take 10 mg by mouth 2 (two) times daily. Patient is unsure of dose    [provider]  doxycycline (VIBRAMYCIN) 100 MG capsule Take 1 capsule (100 mg total) by mouth 2 (two) times daily. 02/12/21   Coral Spikes, DO  gabapentin (NEURONTIN) 300 MG capsule Take 300 mg by mouth daily. 02/04/21   [provider]  hydrOXYzine (ATARAX/VISTARIL) 25 MG tablet hydroxyzine HCl 25 mg tablet    [provider]  ketorolac (TORADOL) 10  MG tablet Take 1 tablet (10 mg total) by mouth every 6 (six) hours as needed for moderate pain or severe pain. 02/13/21   Coral Spikes, DO  Meloxicam 10 MG CAPS Take 1 capsule by mouth daily as needed.    [provider]  pantoprazole (PROTONIX) 40 MG tablet Take 40 mg by mouth daily. 02/08/21   [provider]  prochlorperazine (COMPAZINE) 10 MG tablet Take 10 mg by mouth every 6 (six) hours as needed for nausea or vomiting.    [provider]  QUEtiapine (SEROQUEL) 400 MG tablet Take 400 mg by mouth at bedtime.    [provider]   amitriptyline (ELAVIL) 25 MG tablet Take 2 tablets (50 mg total) by mouth at bedtime. Patient not taking: No sig reported 08/26/14 01/01/21  Kathrynn Ducking, MD  clonazePAM (KLONOPIN) 0.5 MG tablet Take 0.5 mg by mouth 2 (two) times daily.  01/01/21  [provider]  temazepam (RESTORIL) 30 MG capsule Take 30 mg by mouth at bedtime.  01/01/21  [provider]    Allergies Penicillin g, Penicillin v, and Penicillins  Family History  Problem Relation Age of Onset  . Breast cancer Mother 66  . Lung cancer Father   . Cancer Brother   . Cancer Other   . Stroke Other     Social History Social History   Tobacco Use  . Smoking status: Current Every Day Smoker    Packs/day: 0.70    Years: 35.00    Pack years: 24.50    Types: Cigarettes  . Smokeless tobacco: Never Used  Vaping Use  . Vaping Use: Never used  Substance Use Topics  . Alcohol use: Yes    Comment: occasional wine  . Drug use: No    Review of Systems  Constitutional: No fever/chills Eyes: No visual changes. ENT: No sore throat.  Positive bilateral ear pain Respiratory: Denies cough Cardiovascular: Denies chest pain Gastrointestinal: Denies abdominal pain Genitourinary: Negative for dysuria. Musculoskeletal: Negative for back pain. Skin: Negative for rash. Psychiatric: no mood changes,     ____________________________________________   PHYSICAL EXAM:  VITAL SIGNS: ED Triage Vitals  Enc Vitals Group     BP 02/15/21 1002 140/80     Pulse Rate 02/15/21 1002 88     Resp 02/15/21 1002 18     Temp 02/15/21 1002 98.4 F (36.9 C)     Temp Source 02/15/21 1002 Oral     SpO2 02/15/21 1002 100 %     Weight 02/15/21 1001 119 lb 14.9 oz (54.4 kg)     Height 02/15/21 1001 $RemoveBefor'5\' 6"'dQpYgzivYofU$  (1.676 m)     Head Circumference --      Peak Flow --      Pain Score --      Pain Loc --      Pain Edu? --      Excl. in [place]? --     Constitutional: Alert and oriented. Well appearing and in no acute  distress. Eyes: Conjunctivae are normal.  Head: Atraumatic. Ears: Left TM has blood and pus noted behind the tympanic membrane, ear canals normal, right TM is dull with fluid and possible pus but no blood noted. Nose: No congestion/rhinnorhea. Mouth/Throat: Mucous membranes are moist.   Neck:  supple positive lymphadenopathy noted Cardiovascular: Normal rate, regular rhythm. Heart sounds are normal Respiratory: Normal respiratory effort.  No retractions, lungs c t a  GU: deferred Musculoskeletal: FROM all extremities, warm and well perfused Neurologic:  Normal  speech and language.  Skin:  Skin is warm, dry and intact. No rash noted. Psychiatric: Mood and affect are normal. Speech and behavior are normal.  ____________________________________________   LABS (all labs ordered are listed, but only abnormal results are displayed)  Labs Reviewed  CBC WITH DIFFERENTIAL/PLATELET - Abnormal; Notable for the following components:      Result Value   WBC 12.1 (*)    Hemoglobin 11.3 (*)    HCT 34.2 (*)    Platelets 129 (*)    Neutro Abs 8.8 (*)    All other components within normal limits  COMPREHENSIVE METABOLIC PANEL - Abnormal; Notable for the following components:   BUN 28 (*)    Albumin 3.3 (*)    Alkaline Phosphatase 224 (*)    All other components within normal limits   ____________________________________________   ____________________________________________  RADIOLOGY  CT of the head maxillofacial cxr Ct chest, abd, pelvis Mri head with and without contrast  ____________________________________________   PROCEDURES  Procedure(s) performed: No  Procedures    ____________________________________________   INITIAL IMPRESSION / ASSESSMENT AND PLAN / ED COURSE  Pertinent labs & imaging results that were available during my care of the patient were reviewed by me and considered in my medical decision making (see chart for details).   Patient 54-year-old female  presents with ear pain for approximately 1 month.  See HPI.  Physical exam shows patient appears stable  DDx: Acute otitis media whether bacterial or fungal, abscess, mastoiditis, brain mass  CBC has mild elevation of WBC of 35.4, metabolic panel has a BUN of 28, concerning level of alk phosphatase at 224 which could indicate [place] structure  CT of the head shows a large mass in the occipital area  Radiologist called me to notify of results  Spoke with Dr. Kerman Passey, he is in see the patient to notify of the brain mass.  We will order the MRI with and without contrast as indicated by radiologist, and chest x-ray due to the cough and concerns if the mass is metastatic.  Chest xray reviewed by me and confirmed by radiology to show concerning lesions  ----------------------------------------- 11:52 AM on 02/15/2021 -----------------------------------------  In further discussion with the family at bedside, the patient's had a 30 pound weight loss over the past few months, chronic cough, and laryngitis.  MRI with and without contrast shows  IMPRESSION:  Destructive mass lesion in the occipital bone bilaterally left  greater than right. There is associated soft tissue mass in the left  occipital bone extending into the posterior fossa with mass-effect  and mild edema of the left cerebellum. This mass extends into the  left temporal bone. Additional bone lesions are present in the  frontal bones and left parietal bone.    Subtle enhancing lesions right frontal lobe measuring 10 mm and 4  mm. Findings compatible with metastatic disease.      Ct chest abd pelvis shows lesions in the lungs, tspine, and liver  I did explain the findings to the patient. Patient is to f/u with dr Janese Banks at [place] in to see from neurosurgery, states biopsy needed and then consult for reconstruction if needed  Patient was given percocet and zofran, discharged in stable condition     Brenda Middleton was evaluated in Emergency Department on 02/15/2021 for the symptoms described in the history of present illness. She was evaluated in the context of the global COVID-19 pandemic, which necessitated consideration that the patient might  be at risk for infection with the SARS-CoV-2 virus that causes COVID-19. Institutional protocols and algorithms that pertain to the evaluation of patients at risk for COVID-19 are in a state of rapid change based on information released by regulatory bodies including the CDC and federal and state organizations. These policies and algorithms were followed during the patient's care in the ED.    As part of my medical decision making, I reviewed the following data within the [place] History obtained from family, Nursing notes reviewed and incorporated, Labs reviewed , Old chart reviewed, Radiograph reviewed , Discussed with radiologist, A consult was requested and obtained from this/these consultant(s) neurosurgery and oncology, Evaluated by EM attending Dr. Kerman Passey, Notes from prior ED visits and [place] Controlled Substance Database  ____________________________________________   FINAL CLINICAL IMPRESSION(S) / ED DIAGNOSES  Final diagnoses:  Brain mass  Lung cancer metastatic to brain ([place])      NEW MEDICATIONS STARTED DURING THIS VISIT:  New Prescriptions   ONDANSETRON (ZOFRAN-ODT) 4 MG DISINTEGRATING TABLET    Take 1 tablet (4 mg total) by mouth every 8 (eight) hours as needed.   OXYCODONE-ACETAMINOPHEN (PERCOCET) 5-325 MG TABLET    Take 1 tablet by mouth every 4 (four) hours as needed for severe pain.     Note:  This document was prepared using Dragon voice recognition software and may include unintentional dictation errors.    Versie Starks, PA-C 02/15/21 1533    Harvest Dark, MD 02/16/21 [phone]

## 2021-02-16 ENCOUNTER — Encounter: Payer: Self-pay | Admitting: *Deleted

## 2021-02-16 DIAGNOSIS — M899 Disorder of bone, unspecified: Secondary | ICD-10-CM

## 2021-02-16 DIAGNOSIS — R918 Other nonspecific abnormal finding of lung field: Secondary | ICD-10-CM

## 2021-02-16 NOTE — Progress Notes (Signed)
  Oncology Nurse Navigator Documentation  Navigator Location: [facility] (02/16/21 1300) Referral Date to RadOnc/MedOnc: 02/15/21 (02/16/21 1300) )Navigator Encounter Type: Telephone (02/16/21 1300) Telephone: Asess Navigation Needs;Outgoing Call (02/16/21 1300) Abnormal Finding Date: 02/15/21 (02/16/21 1300)                   Treatment Phase: Abnormal Scans (02/16/21 1300) Barriers/Navigation Needs: Coordination of Care;Pain;Underinsured;Unemployed;Financial Toxicity (02/16/21 1300)   Interventions: Coordination of Care;Referrals (02/16/21 1300) Referrals: Radiation Oncology;Social Work;Palliative Care (02/16/21 1300) Coordination of Care: Appts;Radiology (02/16/21 1300)        Acuity: Level 4-High Needs (Greater Than 4 Barriers Identified) (02/16/21 1300)      phone call made to patient to introduce to navigator services and review upcoming appts. All questions answered during call. Contact info given and instructed to call if has any questions or needs prior to next appt. Pt verbalized understanding.     Time Spent with Patient: 45 (02/16/21 1300)

## 2021-02-17 ENCOUNTER — Ambulatory Visit: Payer: Medicaid Other | Admitting: Radiation Oncology

## 2021-02-17 ENCOUNTER — Emergency Department: Payer: Medicaid Other

## 2021-02-17 ENCOUNTER — Encounter: Payer: Self-pay | Admitting: Emergency Medicine

## 2021-02-17 ENCOUNTER — Other Ambulatory Visit: Payer: Self-pay

## 2021-02-17 ENCOUNTER — Emergency Department
Admission: EM | Admit: 2021-02-17 | Discharge: 2021-02-17 | Disposition: A | Discharge status: Home / Self Care | Payer: Medicaid Other | Source: Home / Self Care | Attending: Emergency Medicine | Admitting: Emergency Medicine

## 2021-02-17 ENCOUNTER — Telehealth: Payer: Self-pay | Admitting: Certified Nurse Midwife

## 2021-02-17 DIAGNOSIS — H9202 Otalgia, left ear: Secondary | ICD-10-CM | POA: Insufficient documentation

## 2021-02-17 DIAGNOSIS — Z8541 Personal history of malignant neoplasm of cervix uteri: Secondary | ICD-10-CM | POA: Insufficient documentation

## 2021-02-17 DIAGNOSIS — R519 Headache, unspecified: Secondary | ICD-10-CM | POA: Insufficient documentation

## 2021-02-17 DIAGNOSIS — C799 Secondary malignant neoplasm of unspecified site: Secondary | ICD-10-CM | POA: Insufficient documentation

## 2021-02-17 DIAGNOSIS — D013 Carcinoma in situ of anus and anal canal: Secondary | ICD-10-CM

## 2021-02-17 DIAGNOSIS — R85613 High grade squamous intraepithelial lesion on cytologic smear of anus (HGSIL): Secondary | ICD-10-CM

## 2021-02-17 DIAGNOSIS — R112 Nausea with vomiting, unspecified: Secondary | ICD-10-CM | POA: Insufficient documentation

## 2021-02-17 DIAGNOSIS — Z87891 Personal history of nicotine dependence: Secondary | ICD-10-CM | POA: Insufficient documentation

## 2021-02-17 LAB — COMPREHENSIVE METABOLIC PANEL
ALT: 106 U/L — ABNORMAL HIGH (ref 0–44)
AST: 84 U/L — ABNORMAL HIGH (ref 15–41)
Albumin: 3.2 g/dL — ABNORMAL LOW (ref 3.5–5.0)
Alkaline Phosphatase: 432 U/L — ABNORMAL HIGH (ref 38–126)
Anion gap: 9 (ref 5–15)
BUN: 21 mg/dL — ABNORMAL HIGH (ref 6–20)
CO2: 24 mmol/L (ref 22–32)
Calcium: 8.5 mg/dL — ABNORMAL LOW (ref 8.9–10.3)
Chloride: 104 mmol/L (ref 98–111)
Creatinine, Ser: 0.72 mg/dL (ref 0.44–1.00)
GFR, Estimated: >60 mL/min (ref >60)
Glucose, Bld: 100 mg/dL — ABNORMAL HIGH (ref 70–99)
Potassium: 3.7 mmol/L (ref 3.5–5.1)
Sodium: 137 mmol/L (ref 135–145)
Total Bilirubin: 1.2 mg/dL (ref 0.3–1.2)
Total Protein: 6.2 g/dL — ABNORMAL LOW (ref 6.5–8.1)

## 2021-02-17 LAB — URINALYSIS, COMPLETE (UACMP) WITH MICROSCOPIC
Bacteria, UA: NONE SEEN
Bilirubin Urine: NEGATIVE
Glucose, UA: NEGATIVE mg/dL
Ketones, ur: NEGATIVE mg/dL
Leukocytes,Ua: NEGATIVE
Nitrite: NEGATIVE
Protein, ur: NEGATIVE mg/dL
Specific Gravity, Urine: 1.009 (ref 1.005–1.030)
pH: 7 (ref 5.0–8.0)

## 2021-02-17 LAB — CBC WITH DIFFERENTIAL/PLATELET
Abs Immature Granulocytes: 0.04 10*3/uL (ref 0.00–0.07)
Basophils Absolute: 0.1 10*3/uL (ref 0.0–0.1)
Basophils Relative: 1 %
Eosinophils Absolute: 0.4 10*3/uL (ref 0.0–0.5)
Eosinophils Relative: 3 %
HCT: 38.5 % (ref 36.0–46.0)
Hemoglobin: 13.2 g/dL (ref 12.0–15.0)
Immature Granulocytes: 0 %
Lymphocytes Relative: 12 %
Lymphs Abs: 1.5 10*3/uL (ref 0.7–4.0)
MCH: 29.2 pg (ref 26.0–34.0)
MCHC: 34.3 g/dL (ref 30.0–36.0)
MCV: 85.2 fL (ref 80.0–100.0)
Monocytes Absolute: 0.6 10*3/uL (ref 0.1–1.0)
Monocytes Relative: 5 %
Neutro Abs: 9.4 10*3/uL — ABNORMAL HIGH (ref 1.7–7.7)
Neutrophils Relative %: 79 %
Platelets: 159 10*3/uL (ref 150–400)
RBC: 4.52 MIL/uL (ref 3.87–5.11)
RDW: 15 % (ref 11.5–15.5)
WBC: 11.9 10*3/uL — ABNORMAL HIGH (ref 4.0–10.5)
nRBC: 0.2 % (ref 0.0–0.2)

## 2021-02-17 LAB — LIPASE, BLOOD: Lipase: 62 U/L — ABNORMAL HIGH (ref 11–51)

## 2021-02-17 MED ORDER — PROMETHAZINE HCL 12.5 MG PO TABS: 12.5000 mg | ORAL_TABLET | Freq: Four times a day (QID) | ORAL | 0 refills | Status: DC | PRN

## 2021-02-17 MED ORDER — MORPHINE SULFATE (PF) 4 MG/ML IV SOLN
4.0000 mg | Freq: Once | INTRAVENOUS | Status: AC
  Administered 2021-02-17: 4 mg via INTRAVENOUS
  Filled 2021-02-17: qty 1

## 2021-02-17 MED ORDER — DROPERIDOL 2.5 MG/ML IJ SOLN
2.5000 mg | Freq: Once | INTRAMUSCULAR | Status: AC
  Administered 2021-02-17: 2.5 mg via INTRAVENOUS
  Filled 2021-02-17: qty 2

## 2021-02-17 MED ORDER — SODIUM CHLORIDE 0.9 % IV SOLN
12.5000 mg | Freq: Once | INTRAVENOUS | Status: AC
  Administered 2021-02-17: 12.5 mg via INTRAVENOUS
  Filled 2021-02-17: qty 0.5

## 2021-02-17 MED ORDER — LACTATED RINGERS IV BOLUS
1000.0000 mL | Freq: Once | INTRAVENOUS | Status: AC
  Administered 2021-02-17: 1000 mL via INTRAVENOUS

## 2021-02-17 NOTE — ED Notes (Signed)
Mickel Baas RN adjusted head of bed and dimmed lights per patient request.

## 2021-02-17 NOTE — ED Provider Notes (Signed)
[facility] Emergency Department Provider Note   ____________________________________________   Event Date/Time   First MD Initiated Contact with Patient 02/17/21 0813     (approximate)  I have reviewed the triage vital signs and the nursing notes.   HISTORY  Chief Complaint Emesis and Otalgia    HPI Brenda Middleton is a 60 y.o. female with past medical history of hypertension and chronic back pain who presents to the ED complaining of nausea and vomiting.  Patient reports she was seen in the ED yesterday for 1 week of left ear pain and pain along the left side of her head.  At that time, she was told she had a mass around the left lower portion of her head as well as signs of widespread cancer, likely starting from her lung.  She was prescribed pain and nausea medication, provided with follow-up with oncology tomorrow.  She states she has been taking the medication for pain at home, but did so on an empty stomach and has been feeling very nauseous.  She had multiple episodes of nonbilious and nonbloody emesis at home, denies abdominal pain or diarrhea.  She has not had any fevers, dysuria, hematuria, or flank pain.  She has been dealing with a dry cough for the past couple of months, but denies any shortness of breath or chest pain.  She does complain of ongoing pain in her left temporal region and left ear.        Past Medical History:  Diagnosis Date  . Anxiety   . Benign neoplasm of cervix uteri   . Chronic hepatitis C without mention of hepatic coma   . Chronic low back pain 08/26/2014  . Constipation   . Depressive disorder   . Displacement of cervical intervertebral disc   . Hypertensive disorder   . Iron deficiency anemia due to chronic blood loss 09/12/2019  . Palpitations   . Prolapsed cervical intervertebral disc     Patient Active Problem List   Diagnosis Date Noted  . Iron deficiency anemia due to chronic blood loss 09/12/2019  . Family  history of cancer 09/12/2019  . Blood in the stool 09/12/2019  . Elevated alkaline phosphatase level 09/12/2019  . Chronic low back pain 08/26/2014    Past Surgical History:  Procedure Laterality Date  . CONIZATION CERVIX    . DILATION AND CURETTAGE OF UTERUS    . HAND SURGERY  2008,2015   Carpel Tunnel  . TONSILLECTOMY    . VULVECTOMY      Prior to Admission medications   Medication Sig Start Date End Date Taking? Authorizing Provider  promethazine (PHENERGAN) 12.5 MG tablet Take 1 tablet (12.5 mg total) by mouth every 6 (six) hours as needed for nausea or vomiting. 02/17/21  Yes Blake Divine, MD  albuterol (PROVENTIL HFA;VENTOLIN HFA) 108 (90 BASE) MCG/ACT inhaler Inhale 2 puffs into the lungs every 4 (four) hours as needed for wheezing or shortness of breath.    [provider]  budesonide-formoterol (SYMBICORT) 80-4.5 MCG/ACT inhaler Inhale 2 puffs into the lungs 2 (two) times daily.    [provider]  busPIRone (BUSPAR) 10 MG tablet Take 10 mg by mouth 2 (two) times daily. Patient is unsure of dose    [provider]  doxycycline (VIBRAMYCIN) 100 MG capsule Take 1 capsule (100 mg total) by mouth 2 (two) times daily. 02/12/21   Coral Spikes, DO  gabapentin (NEURONTIN) 300 MG capsule Take 300 mg by mouth daily.  02/04/21   [provider]  hydrOXYzine (ATARAX/VISTARIL) 25 MG tablet hydroxyzine HCl 25 mg tablet    [provider]  ketorolac (TORADOL) 10 MG tablet Take 1 tablet (10 mg total) by mouth every 6 (six) hours as needed for moderate pain or severe pain. 02/13/21   Coral Spikes, DO  Meloxicam 10 MG CAPS Take 1 capsule by mouth daily as needed.    [provider]  ondansetron (ZOFRAN-ODT) 4 MG disintegrating tablet Take 1 tablet (4 mg total) by mouth every 8 (eight) hours as needed. 02/15/21   Fisher, Linden Dolin, PA-C  oxyCODONE-acetaminophen (PERCOCET) 5-325 MG tablet Take 1 tablet by mouth every 4 (four) hours as needed for  severe pain. 02/15/21 02/15/22  Fisher, Linden Dolin, PA-C  pantoprazole (PROTONIX) 40 MG tablet Take 40 mg by mouth daily. 02/08/21   [provider]  prochlorperazine (COMPAZINE) 10 MG tablet Take 10 mg by mouth every 6 (six) hours as needed for nausea or vomiting.    [provider]  QUEtiapine (SEROQUEL) 400 MG tablet Take 400 mg by mouth at bedtime.    [provider]  amitriptyline (ELAVIL) 25 MG tablet Take 2 tablets (50 mg total) by mouth at bedtime. Patient not taking: No sig reported 08/26/14 01/01/21  Kathrynn Ducking, MD  clonazePAM (KLONOPIN) 0.5 MG tablet Take 0.5 mg by mouth 2 (two) times daily.  01/01/21  [provider]  temazepam (RESTORIL) 30 MG capsule Take 30 mg by mouth at bedtime.  01/01/21  [provider]    Allergies Penicillin g, Penicillin v, and Penicillins  Family History  Problem Relation Age of Onset  . Breast cancer Mother 37  . Lung cancer Father   . Cancer Brother   . Cancer Other   . Stroke Other     Social History Social History   Tobacco Use  . Smoking status: Former Smoker    Packs/day: 0.70    Years: 35.00    Pack years: 24.50    Types: Cigarettes  . Smokeless tobacco: Never Used  Vaping Use  . Vaping Use: Never used  Substance Use Topics  . Alcohol use: Yes    Comment: occasional wine  . Drug use: No    Review of Systems  Constitutional: No fever/chills Eyes: No visual changes. ENT: No sore throat.  Positive for left ear pain. Cardiovascular: Denies chest pain. Respiratory: Denies shortness of breath.  Positive for cough. Gastrointestinal: No abdominal pain.  Positive for nausea and vomiting.  No diarrhea.  No constipation. Genitourinary: Negative for dysuria. Musculoskeletal: Negative for back pain. Skin: Negative for rash. Neurological: Negative for headaches, focal weakness or numbness.  ____________________________________________   PHYSICAL EXAM:  VITAL SIGNS: ED Triage Vitals   Enc Vitals Group     BP 02/17/21 0811 (!) 189/108     Pulse Rate 02/17/21 0811 92     Resp 02/17/21 0811 17     Temp 02/17/21 0811 98.4 F (36.9 C)     Temp Source 02/17/21 0811 Oral     SpO2 02/17/21 0811 97 %     Weight 02/17/21 0814 120 lb (54.4 kg)     Height 02/17/21 0814 5\' 6"  (1.676 m)     Head Circumference --      Peak Flow --      Pain Score 02/17/21 0813 8     Pain Loc --      Pain Edu? --      Excl. in [place]? --  Constitutional: Alert and oriented. Eyes: Conjunctivae are normal. Head: Atraumatic. Ears: Right TM clear.  Left TM with bulging and what appears to be hemotympanum.  Tenderness noted over left mastoid process and extending over left lateral upper neck with no erythema, warmth, or fluctuance. Nose: No congestion/rhinnorhea. Mouth/Throat: Mucous membranes are moist. Neck: Normal ROM Cardiovascular: Normal rate, regular rhythm. Grossly normal heart sounds. Respiratory: Normal respiratory effort.  No retractions. Lungs CTAB. Gastrointestinal: Soft and nontender. No distention. Genitourinary: deferred Musculoskeletal: No lower extremity tenderness nor edema. Neurologic:  Normal speech and language. No gross focal neurologic deficits are appreciated. Skin:  Skin is warm, dry and intact. No rash noted. Psychiatric: Mood and affect are normal. Speech and behavior are normal.  ____________________________________________   LABS (all labs ordered are listed, but only abnormal results are displayed)  Labs Reviewed  CBC WITH DIFFERENTIAL/PLATELET - Abnormal; Notable for the following components:      Result Value   WBC 11.9 (*)    Neutro Abs 9.4 (*)    All other components within normal limits  COMPREHENSIVE METABOLIC PANEL - Abnormal; Notable for the following components:   Glucose, Bld 100 (*)    BUN 21 (*)    Calcium 8.5 (*)    Total Protein 6.2 (*)    Albumin 3.2 (*)    AST 84 (*)    ALT 106 (*)    Alkaline Phosphatase 432 (*)    All other  components within normal limits  LIPASE, BLOOD - Abnormal; Notable for the following components:   Lipase 62 (*)    All other components within normal limits  URINALYSIS, COMPLETE (UACMP) WITH MICROSCOPIC - Abnormal; Notable for the following components:   Color, Urine YELLOW (*)    APPearance HAZY (*)    Hgb urine dipstick SMALL (*)    All other components within normal limits    PROCEDURES  Procedure(s) performed (including Critical Care):  Procedures   ____________________________________________   INITIAL IMPRESSION / ASSESSMENT AND PLAN / ED COURSE       60 year old female with possible history of hypertension and chronic back pain presents to the ED with nausea and vomiting after taking pain medication on an empty stomach last night.  She denies abdominal pain and has no focal tenderness on exam.  She also denies any urinary symptoms.  We will hydrate with IV fluids, treat with Phenergan, and check labs for dehydration, electrolyte abnormality, or AKI.  Her nausea and vomiting does seem likely to be a side effect of Percocet prescribed yesterday.  There are no signs of acute infection at her left TM or mastoid process.  Imaging reviewed from yesterday and showed no evidence of mastoiditis or other infectious process, pain likely due to large mass near patient's skull base.  She was evaluated by neurosurgery yesterday, who did not recommend acute surgical intervention and patient will require outpatient follow-up for biopsy and further arrangement of treatment of her newly diagnosed metastatic cancer.  Labs remarkable for mild transaminitis with increased alkaline phosphatase from previous, although bilirubin remains within normal limits.  Right upper quadrant ultrasound was performed and shows mild intra and extrahepatic biliary dilatation, comparable to CT scanner from previous ED visit.  No findings to suggest biliary obstruction at this time, patient does have fullness in the  right renal pelvis but no symptoms at this time to suggest nephrolithiasis or other urinary obstruction.  UA shows no signs of infection.  Patient initially continued to feel nauseous following dose of Phenergan,  was subsequently given dose of droperidol.  Patient reports feeling better and has been able to drink some water.  She was offered admission but declines and prefers to follow-up with oncology in the office tomorrow.  She was counseled to return to the ED for any new or worsening symptoms, patient agrees with plan.      ____________________________________________   FINAL CLINICAL IMPRESSION(S) / ED DIAGNOSES  Final diagnoses:  Nausea and vomiting  Metastatic malignant neoplasm, unspecified site [facility])     ED Discharge Orders         Ordered    promethazine (PHENERGAN) 12.5 MG tablet  Every 6 hours PRN        02/17/21 1334           Note:  This document was prepared using Dragon voice recognition software and may include unintentional dictation errors.   Blake Divine, MD 02/17/21 [id number]

## 2021-02-17 NOTE — ED Notes (Signed)
Upon discharger pt A&O x4, able to ambulate, discharge instructions acknowledged. IV removed

## 2021-02-17 NOTE — Telephone Encounter (Signed)
Telephone call to patient, HIPPA approved message left on identified line.    Dani Gobble, CNM [facility], [facility] 02/17/21 10:45 AM

## 2021-02-17 NOTE — ED Triage Notes (Signed)
Pt comes into the ED via [facility] from home c/o N/V, otalgia, left side head pain.  Pt dx with lung cancer yesterday.  Pt does have a mass on the neck which is what led to the dx.  Pt in NAD with even and unlabored respirations.  Pt has stable VSS, CBG 124. Pt did present hypertensive.

## 2021-02-18 ENCOUNTER — Inpatient Hospital Stay: Payer: Medicaid Other | Admitting: Hospice and Palliative Medicine

## 2021-02-18 ENCOUNTER — Encounter: Payer: Self-pay | Admitting: Emergency Medicine

## 2021-02-18 ENCOUNTER — Other Ambulatory Visit: Payer: Self-pay

## 2021-02-18 ENCOUNTER — Ambulatory Visit: Payer: Medicaid Other | Admitting: Radiation Oncology

## 2021-02-18 ENCOUNTER — Inpatient Hospital Stay
Admission: EM | Admit: 2021-02-18 | Discharge: 2021-02-21 | DRG: 515 | Disposition: A | Discharge status: Home / Self Care | Payer: Medicaid Other | Attending: Family Medicine | Admitting: Family Medicine

## 2021-02-18 ENCOUNTER — Emergency Department: Payer: Medicaid Other

## 2021-02-18 ENCOUNTER — Inpatient Hospital Stay: Payer: Medicaid Other | Admitting: Oncology

## 2021-02-18 ENCOUNTER — Telehealth: Payer: Self-pay | Admitting: *Deleted

## 2021-02-18 ENCOUNTER — Inpatient Hospital Stay: Payer: Medicaid Other

## 2021-02-18 DIAGNOSIS — M8458XA Pathological fracture in neoplastic disease, other specified site, initial encounter for fracture: Secondary | ICD-10-CM | POA: Diagnosis present

## 2021-02-18 DIAGNOSIS — C799 Secondary malignant neoplasm of unspecified site: Principal | ICD-10-CM

## 2021-02-18 DIAGNOSIS — E860 Dehydration: Secondary | ICD-10-CM

## 2021-02-18 DIAGNOSIS — F329 Major depressive disorder, single episode, unspecified: Secondary | ICD-10-CM

## 2021-02-18 DIAGNOSIS — Z7189 Other specified counseling: Secondary | ICD-10-CM | POA: Diagnosis not present

## 2021-02-18 DIAGNOSIS — K219 Gastro-esophageal reflux disease without esophagitis: Secondary | ICD-10-CM

## 2021-02-18 DIAGNOSIS — Z681 Body mass index (BMI) 19 or less, adult: Secondary | ICD-10-CM | POA: Diagnosis not present

## 2021-02-18 DIAGNOSIS — Z515 Encounter for palliative care: Secondary | ICD-10-CM | POA: Insufficient documentation

## 2021-02-18 DIAGNOSIS — F419 Anxiety disorder, unspecified: Secondary | ICD-10-CM | POA: Diagnosis present

## 2021-02-18 DIAGNOSIS — B1920 Unspecified viral hepatitis C without hepatic coma: Secondary | ICD-10-CM | POA: Diagnosis present

## 2021-02-18 DIAGNOSIS — B182 Chronic viral hepatitis C: Secondary | ICD-10-CM | POA: Diagnosis present

## 2021-02-18 DIAGNOSIS — R918 Other nonspecific abnormal finding of lung field: Secondary | ICD-10-CM | POA: Diagnosis not present

## 2021-02-18 DIAGNOSIS — M5459 Other low back pain: Secondary | ICD-10-CM | POA: Diagnosis present

## 2021-02-18 DIAGNOSIS — C7931 Secondary malignant neoplasm of brain: Secondary | ICD-10-CM | POA: Diagnosis present

## 2021-02-18 DIAGNOSIS — R634 Abnormal weight loss: Secondary | ICD-10-CM | POA: Diagnosis present

## 2021-02-18 DIAGNOSIS — N132 Hydronephrosis with renal and ureteral calculous obstruction: Secondary | ICD-10-CM | POA: Diagnosis present

## 2021-02-18 DIAGNOSIS — D500 Iron deficiency anemia secondary to blood loss (chronic): Secondary | ICD-10-CM | POA: Diagnosis present

## 2021-02-18 DIAGNOSIS — C7951 Secondary malignant neoplasm of bone: Secondary | ICD-10-CM | POA: Diagnosis present

## 2021-02-18 DIAGNOSIS — E43 Unspecified severe protein-calorie malnutrition: Secondary | ICD-10-CM | POA: Diagnosis present

## 2021-02-18 DIAGNOSIS — J441 Chronic obstructive pulmonary disease with (acute) exacerbation: Secondary | ICD-10-CM | POA: Diagnosis present

## 2021-02-18 DIAGNOSIS — Z9049 Acquired absence of other specified parts of digestive tract: Secondary | ICD-10-CM

## 2021-02-18 DIAGNOSIS — R490 Dysphonia: Secondary | ICD-10-CM | POA: Diagnosis present

## 2021-02-18 DIAGNOSIS — C342 Malignant neoplasm of middle lobe, bronchus or lung: Secondary | ICD-10-CM | POA: Diagnosis present

## 2021-02-18 DIAGNOSIS — G893 Neoplasm related pain (acute) (chronic): Secondary | ICD-10-CM

## 2021-02-18 DIAGNOSIS — J449 Chronic obstructive pulmonary disease, unspecified: Secondary | ICD-10-CM | POA: Diagnosis present

## 2021-02-18 DIAGNOSIS — M4856XD Collapsed vertebra, not elsewhere classified, lumbar region, subsequent encounter for fracture with routine healing: Secondary | ICD-10-CM

## 2021-02-18 DIAGNOSIS — C7889 Secondary malignant neoplasm of other digestive organs: Secondary | ICD-10-CM | POA: Diagnosis present

## 2021-02-18 DIAGNOSIS — Z20822 Contact with and (suspected) exposure to covid-19: Secondary | ICD-10-CM | POA: Diagnosis present

## 2021-02-18 DIAGNOSIS — Z79899 Other long term (current) drug therapy: Secondary | ICD-10-CM

## 2021-02-18 DIAGNOSIS — F32A Depression, unspecified: Secondary | ICD-10-CM | POA: Diagnosis present

## 2021-02-18 DIAGNOSIS — Z9114 Patient's other noncompliance with medication regimen: Secondary | ICD-10-CM

## 2021-02-18 DIAGNOSIS — C778 Secondary and unspecified malignant neoplasm of lymph nodes of multiple regions: Secondary | ICD-10-CM | POA: Diagnosis present

## 2021-02-18 DIAGNOSIS — I10 Essential (primary) hypertension: Secondary | ICD-10-CM | POA: Diagnosis present

## 2021-02-18 DIAGNOSIS — Z7951 Long term (current) use of inhaled steroids: Secondary | ICD-10-CM | POA: Diagnosis not present

## 2021-02-18 DIAGNOSIS — Z87891 Personal history of nicotine dependence: Secondary | ICD-10-CM

## 2021-02-18 DIAGNOSIS — M4856XA Collapsed vertebra, not elsewhere classified, lumbar region, initial encounter for fracture: Secondary | ICD-10-CM

## 2021-02-18 DIAGNOSIS — M545 Low back pain, unspecified: Secondary | ICD-10-CM

## 2021-02-18 DIAGNOSIS — Z560 Unemployment, unspecified: Secondary | ICD-10-CM

## 2021-02-18 DIAGNOSIS — M8448XA Pathological fracture, other site, initial encounter for fracture: Secondary | ICD-10-CM

## 2021-02-18 DIAGNOSIS — Z880 Allergy status to penicillin: Secondary | ICD-10-CM

## 2021-02-18 DIAGNOSIS — M4854XA Collapsed vertebra, not elsewhere classified, thoracic region, initial encounter for fracture: Secondary | ICD-10-CM

## 2021-02-18 LAB — COMPREHENSIVE METABOLIC PANEL
ALT: 77 U/L — ABNORMAL HIGH (ref 0–44)
AST: 47 U/L — ABNORMAL HIGH (ref 15–41)
Albumin: 3.2 g/dL — ABNORMAL LOW (ref 3.5–5.0)
Alkaline Phosphatase: 447 U/L — ABNORMAL HIGH (ref 38–126)
Anion gap: 8 (ref 5–15)
BUN: 18 mg/dL (ref 6–20)
CO2: 28 mmol/L (ref 22–32)
Calcium: 8.9 mg/dL (ref 8.9–10.3)
Chloride: 101 mmol/L (ref 98–111)
Creatinine, Ser: 0.82 mg/dL (ref 0.44–1.00)
GFR, Estimated: >60 mL/min (ref >60)
Glucose, Bld: 118 mg/dL — ABNORMAL HIGH (ref 70–99)
Potassium: 4.3 mmol/L (ref 3.5–5.1)
Sodium: 137 mmol/L (ref 135–145)
Total Bilirubin: 0.9 mg/dL (ref 0.3–1.2)
Total Protein: 6.5 g/dL (ref 6.5–8.1)

## 2021-02-18 LAB — CBC WITH DIFFERENTIAL/PLATELET
Abs Immature Granulocytes: 0.03 10*3/uL (ref 0.00–0.07)
Basophils Absolute: 0 10*3/uL (ref 0.0–0.1)
Basophils Relative: 0 %
Eosinophils Absolute: 0.2 10*3/uL (ref 0.0–0.5)
Eosinophils Relative: 2 %
HCT: 34.7 % — ABNORMAL LOW (ref 36.0–46.0)
Hemoglobin: 11.7 g/dL — ABNORMAL LOW (ref 12.0–15.0)
Immature Granulocytes: 0 %
Lymphocytes Relative: 12 %
Lymphs Abs: 1.2 10*3/uL (ref 0.7–4.0)
MCH: 28.9 pg (ref 26.0–34.0)
MCHC: 33.7 g/dL (ref 30.0–36.0)
MCV: 85.7 fL (ref 80.0–100.0)
Monocytes Absolute: 0.7 10*3/uL (ref 0.1–1.0)
Monocytes Relative: 7 %
Neutro Abs: 7.6 10*3/uL (ref 1.7–7.7)
Neutrophils Relative %: 79 %
Platelets: 156 10*3/uL (ref 150–400)
RBC: 4.05 MIL/uL (ref 3.87–5.11)
RDW: 15 % (ref 11.5–15.5)
WBC: 9.7 10*3/uL (ref 4.0–10.5)
nRBC: 0 % (ref 0.0–0.2)

## 2021-02-18 LAB — URINALYSIS, COMPLETE (UACMP) WITH MICROSCOPIC
Bacteria, UA: NONE SEEN
Bilirubin Urine: NEGATIVE
Glucose, UA: NEGATIVE mg/dL
Ketones, ur: NEGATIVE mg/dL
Leukocytes,Ua: NEGATIVE
Nitrite: NEGATIVE
Protein, ur: NEGATIVE mg/dL
Specific Gravity, Urine: 1.015 (ref 1.005–1.030)
pH: 6 (ref 5.0–8.0)

## 2021-02-18 LAB — RESP PANEL BY RT-PCR (FLU A&B, COVID) ARPGX2
Influenza A by PCR: NEGATIVE
Influenza B by PCR: NEGATIVE
SARS Coronavirus 2 by RT PCR: NEGATIVE

## 2021-02-18 MED ORDER — OXYCODONE-ACETAMINOPHEN 5-325 MG PO TABS
1.0000 | ORAL_TABLET | ORAL | Status: DC | PRN
  Administered 2021-02-18: 1 via ORAL
  Filled 2021-02-18: qty 1

## 2021-02-18 MED ORDER — HYDROXYZINE HCL 25 MG PO TABS
25.0000 mg | ORAL_TABLET | Freq: Four times a day (QID) | ORAL | Status: DC | PRN
  Filled 2021-02-18: qty 1

## 2021-02-18 MED ORDER — HYDROMORPHONE HCL 1 MG/ML IJ SOLN
1.0000 mg | INTRAMUSCULAR | Status: DC | PRN
  Administered 2021-02-18 (×2): 1 mg via INTRAVENOUS
  Filled 2021-02-18 (×2): qty 1

## 2021-02-18 MED ORDER — SODIUM CHLORIDE 0.9 % IV SOLN
12.5000 mg | Freq: Four times a day (QID) | INTRAVENOUS | Status: DC | PRN
  Administered 2021-02-18 – 2021-02-19 (×3): 12.5 mg via INTRAVENOUS
  Filled 2021-02-18 (×5): qty 0.5

## 2021-02-18 MED ORDER — PANTOPRAZOLE SODIUM 40 MG PO TBEC
40.0000 mg | DELAYED_RELEASE_TABLET | Freq: Every day | ORAL | Status: DC
Start: 2021-02-18 — End: 2021-02-21
  Administered 2021-02-20: 40 mg via ORAL
  Filled 2021-02-18 (×2): qty 1

## 2021-02-18 MED ORDER — FENTANYL 25 MCG/HR TD PT72
1.0000 | MEDICATED_PATCH | TRANSDERMAL | Status: DC
  Administered 2021-02-18 – 2021-02-21 (×2): 1 via TRANSDERMAL
  Filled 2021-02-18 (×3): qty 1

## 2021-02-18 MED ORDER — ONDANSETRON HCL 4 MG/2ML IJ SOLN
4.0000 mg | Freq: Four times a day (QID) | INTRAMUSCULAR | Status: DC | PRN
  Administered 2021-02-18: 4 mg via INTRAVENOUS
  Filled 2021-02-18: qty 2

## 2021-02-18 MED ORDER — AMLODIPINE BESYLATE 5 MG PO TABS
5.0000 mg | ORAL_TABLET | Freq: Every day | ORAL | Status: DC
  Administered 2021-02-19 – 2021-02-20 (×2): 5 mg via ORAL
  Filled 2021-02-18 (×2): qty 1

## 2021-02-18 MED ORDER — GABAPENTIN 300 MG PO CAPS
300.0000 mg | ORAL_CAPSULE | Freq: Every day | ORAL | Status: DC
  Administered 2021-02-20: 300 mg via ORAL
  Filled 2021-02-18 (×2): qty 1

## 2021-02-18 MED ORDER — MOMETASONE FURO-FORMOTEROL FUM 100-5 MCG/ACT IN AERO
2.0000 | INHALATION_SPRAY | Freq: Two times a day (BID) | RESPIRATORY_TRACT | Status: DC
  Administered 2021-02-18 – 2021-02-21 (×5): 2 via RESPIRATORY_TRACT
  Filled 2021-02-18: qty 8.8

## 2021-02-18 MED ORDER — ENOXAPARIN SODIUM 40 MG/0.4ML ~~LOC~~ SOLN
40.0000 mg | SUBCUTANEOUS | Status: DC
  Administered 2021-02-18 – 2021-02-20 (×3): 40 mg via SUBCUTANEOUS
  Filled 2021-02-18 (×3): qty 0.4

## 2021-02-18 MED ORDER — HYDRALAZINE HCL 20 MG/ML IJ SOLN
10.0000 mg | Freq: Four times a day (QID) | INTRAMUSCULAR | Status: DC | PRN
  Administered 2021-02-18 – 2021-02-19 (×2): 10 mg via INTRAVENOUS
  Filled 2021-02-18 (×3): qty 1

## 2021-02-18 MED ORDER — BUSPIRONE HCL 10 MG PO TABS
10.0000 mg | ORAL_TABLET | Freq: Two times a day (BID) | ORAL | Status: DC
  Administered 2021-02-18 – 2021-02-20 (×4): 10 mg via ORAL
  Filled 2021-02-18 (×5): qty 1

## 2021-02-18 MED ORDER — HYDROMORPHONE HCL 1 MG/ML IJ SOLN
0.5000 mg | Freq: Once | INTRAMUSCULAR | Status: AC
  Administered 2021-02-18: 0.5 mg via INTRAVENOUS
  Filled 2021-02-18: qty 1

## 2021-02-18 MED ORDER — ALBUTEROL SULFATE HFA 108 (90 BASE) MCG/ACT IN AERS
2.0000 | INHALATION_SPRAY | RESPIRATORY_TRACT | Status: DC | PRN
  Filled 2021-02-18: qty 6.7

## 2021-02-18 MED ORDER — ONDANSETRON HCL 4 MG PO TABS
4.0000 mg | ORAL_TABLET | Freq: Three times a day (TID) | ORAL | Status: DC
  Administered 2021-02-18 – 2021-02-21 (×8): 4 mg via ORAL
  Filled 2021-02-18 (×9): qty 1

## 2021-02-18 MED ORDER — DEXAMETHASONE 4 MG PO TABS
8.0000 mg | ORAL_TABLET | Freq: Two times a day (BID) | ORAL | Status: DC
  Administered 2021-02-19 – 2021-02-20 (×3): 8 mg via ORAL
  Filled 2021-02-18 (×9): qty 2

## 2021-02-18 MED ORDER — MORPHINE SULFATE (PF) 4 MG/ML IV SOLN
4.0000 mg | Freq: Once | INTRAVENOUS | Status: DC
  Filled 2021-02-18: qty 1

## 2021-02-18 MED ORDER — ACETAMINOPHEN 500 MG PO TABS
1000.0000 mg | ORAL_TABLET | Freq: Once | ORAL | Status: DC
  Filled 2021-02-18: qty 2

## 2021-02-18 MED ORDER — LIDOCAINE 5 % EX PTCH
1.0000 | MEDICATED_PATCH | CUTANEOUS | Status: DC
  Administered 2021-02-18 – 2021-02-21 (×3): 1 via TRANSDERMAL
  Filled 2021-02-18 (×4): qty 1

## 2021-02-18 MED ORDER — ONDANSETRON HCL 4 MG PO TABS: 4.0000 mg | ORAL_TABLET | Freq: Four times a day (QID) | ORAL | Status: DC | PRN

## 2021-02-18 MED ORDER — SENNA 8.6 MG PO TABS
1.0000 | ORAL_TABLET | Freq: Two times a day (BID) | ORAL | Status: DC
  Administered 2021-02-18: 8.6 mg via ORAL
  Filled 2021-02-18 (×3): qty 1

## 2021-02-18 MED ORDER — OXYCODONE-ACETAMINOPHEN 5-325 MG PO TABS
1.0000 | ORAL_TABLET | ORAL | Status: DC | PRN
  Administered 2021-02-19: 2 via ORAL
  Filled 2021-02-18: qty 1
  Filled 2021-02-18: qty 2

## 2021-02-18 MED ORDER — ONDANSETRON HCL 4 MG/2ML IJ SOLN
4.0000 mg | Freq: Once | INTRAMUSCULAR | Status: AC
  Administered 2021-02-18: 4 mg via INTRAVENOUS
  Filled 2021-02-18: qty 2

## 2021-02-18 MED ORDER — LACTATED RINGERS IV BOLUS
1000.0000 mL | Freq: Once | INTRAVENOUS | Status: AC
  Administered 2021-02-18: 1000 mL via INTRAVENOUS

## 2021-02-18 MED ORDER — SODIUM CHLORIDE 0.9 % IV SOLN: INTRAVENOUS | Status: DC

## 2021-02-18 MED ORDER — QUETIAPINE FUMARATE 200 MG PO TABS
400.0000 mg | ORAL_TABLET | Freq: Every day | ORAL | Status: DC
  Administered 2021-02-18 – 2021-02-20 (×3): 400 mg via ORAL
  Filled 2021-02-18 (×4): qty 2

## 2021-02-18 NOTE — Consult Note (Signed)
Hematology/Oncology Consult note [facility] Telephone:([phone] [phone]  Patient Care Team: Remi Haggard, FNP as PCP - General (Family Medicine) Remi Haggard, FNP (Family Medicine) Christene Lye, MD (General Surgery) Telford Nab, RN as Oncology Nurse Navigator   Name of the patient: Brenda Middleton  [id number]  02-21-1961   Date of visit: 02/18/21 REASON FOR COSULTATION:  Metastatic cancer. History of presenting illness-  60 y.o. female with PMH listed below significant for anxiety, nicotine dependence, hypertension, depression, substance abuse, presented emergency room for evaluation of worsening of back pain. She has recently had multiple ED visit. 02/15/2021, CT head showed aggressive 5.5 cm mass lesion involving the left occipital calvarium with bony destruction and expansion and extension inferior to involve the left temporal bone and skull base.  Mass-effect on the left cerebellum and possible involvement of the subjacent dural venous sinuses. 02/15/2021, MRI brain confirmed a destructive mass lesion occipital bone bilaterally left greater than right, associated soft tissue mass in the left occipital bone extending to the posterior fossa with mass-effect and mild edema of the left cerebellum.  Mass extends into the left temporal bone.  Subtle enhancing lesion right frontal lobe 10 mm and 4 mm.  Compatible with metastatic disease. 02/15/2021 CT chest abdomen pelvis showed 2.7 cm right middle lobe pulmonary nodule, with extensive lesions lymphadenopathy including axillary node, subpectoral node, left supraclavicular node, right paratracheal node, right precarinal node, right hilar node, scattered pulmonary nodules, multiple liver lesions, pathological fracture of L2 Patient was evaluated by Dr. Lacinda Axon will recommend no surgical intervention.  Recommend biopsy of body lesions.  And radiation Patient was discharged home with follow-up  appointment at the cancer center.  She presented to emergency room yesterday due to nausea vomiting after taking pain medication that was prescribed by ED.  Patient was given supportive care and discharged home.  She came back to the emergency room today due to severe back pain. CT thoracic and lumbar spine today showed a sclerotic and lytic metastatic lesion, pathological L2 fracture with less than 50% loss of height, probable pathological fracture at T9 without height loss. Patient is admitted for pain control.  Oncology was consulted for further evaluation and management.  Patient's sister [name] is at the bedside.  Patient currently lives at home with sister.   Review of Systems  Constitutional: Positive for appetite change and fatigue. Negative for chills and fever.  HENT:   Negative for hearing loss and voice change.   Eyes: Negative for eye problems.  Respiratory: Negative for chest tightness, cough and shortness of breath.   Cardiovascular: Negative for chest pain.  Gastrointestinal: Positive for nausea. Negative for abdominal distention, abdominal pain and blood in stool.  Endocrine: Negative for hot flashes.  Genitourinary: Negative for difficulty urinating and frequency.   Musculoskeletal: Positive for back pain. Negative for arthralgias.  Skin: Negative for itching and rash.  Neurological: Positive for headaches. Negative for extremity weakness.  Hematological: Negative for adenopathy.  Psychiatric/Behavioral: Negative for confusion.    Allergies  Allergen Reactions  . Penicillin G Hives    Other reaction(s): HIVES Other reaction(s): HIVES  . Penicillin V Itching  . Penicillins     Patient Active Problem List   Diagnosis Date Noted  . Intractable low back pain 02/18/2021  . Anxiety   . Depressive disorder   . Pathologic fracture of lumbar vertebra, initial encounter   . 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 COPD (chronic obstructive pulmonary  disease) ([place])   . GERD  (gastroesophageal reflux disease)   . Iron deficiency anemia due to chronic blood loss 09/12/2019  . Family history of cancer 09/12/2019  . Blood in the stool 09/12/2019  . Elevated alkaline phosphatase level 09/12/2019  . Chronic low back pain 08/26/2014     Past Medical History:  Diagnosis Date  . Anxiety   . Benign neoplasm of cervix uteri   . Chronic hepatitis C without mention of hepatic coma   . Chronic low back pain 08/26/2014  . Constipation   . Depressive disorder   . Displacement of cervical intervertebral disc   . Hypertensive disorder   . Iron deficiency anemia due to chronic blood loss 09/12/2019  . Palpitations   . Prolapsed cervical intervertebral disc      Past Surgical History:  Procedure Laterality Date  . CONIZATION CERVIX    . DILATION AND CURETTAGE OF UTERUS    . HAND SURGERY  2008,2015   Carpel Tunnel  . TONSILLECTOMY    . VULVECTOMY      Social History   Socioeconomic History  . Marital status: Single    Spouse name: Not on file  . Number of children: 0  . Years of education: college1  . Highest education level: Not on file  Occupational History    Employer: UNEMPLOYED  Tobacco Use  . Smoking status: Former Smoker    Packs/day: 0.70    Years: 35.00    Pack years: 24.50    Types: Cigarettes  . Smokeless tobacco: Never Used  Vaping Use  . Vaping Use: Never used  Substance and Sexual Activity  . Alcohol use: Yes    Comment: occasional wine  . Drug use: No  . Sexual activity: Not Currently  Other Topics Concern  . Not on file  Social History Narrative  . Not on file   Social Determinants of Health   Financial Resource Strain: Not on file  Food Insecurity: Not on file  Transportation Needs: Not on file  Physical Activity: Not on file  Stress: Not on file  Social Connections: Not on file  Intimate Partner Violence: Not on file     Family History  Problem Relation Age of Onset  . Breast cancer Mother 74  . Lung cancer Father    . Cancer Brother   . Cancer Other   . Stroke Other      Current Facility-Administered Medications:  .  0.9 %  sodium chloride infusion, , Intravenous, Continuous, Agbata, Tochukwu, MD .  albuterol (VENTOLIN HFA) 108 (90 Base) MCG/ACT inhaler 2 puff, 2 puff, Inhalation, Q4H PRN, Agbata, Tochukwu, MD .  amLODipine (NORVASC) tablet 5 mg, 5 mg, Oral, Daily, Agbata, Tochukwu, MD .  busPIRone (BUSPAR) tablet 10 mg, 10 mg, Oral, BID, Agbata, Tochukwu, MD .  enoxaparin (LOVENOX) injection 40 mg, 40 mg, Subcutaneous, Q24H, Agbata, Tochukwu, MD .  gabapentin (NEURONTIN) capsule 300 mg, 300 mg, Oral, Daily, Agbata, Tochukwu, MD .  HYDROmorphone (DILAUDID) injection 1 mg, 1 mg, Intravenous, Q4H PRN, Agbata, Tochukwu, MD .  hydrOXYzine (ATARAX/VISTARIL) tablet 25 mg, 25 mg, Oral, Q6H PRN, Agbata, Tochukwu, MD .  lidocaine (LIDODERM) 5 % 1 patch, 1 patch, Transdermal, Q24H, Lucrezia Starch, MD, 1 patch at 02/18/21 [id number] .  mometasone-formoterol (DULERA) 100-5 MCG/ACT inhaler 2 puff, 2 puff, Inhalation, BID, Agbata, Tochukwu, MD .  ondansetron (ZOFRAN) tablet 4 mg, 4 mg, Oral, Q6H PRN **OR** ondansetron (ZOFRAN) injection 4 mg, 4 mg, Intravenous, Q6H PRN, Agbata, Tochukwu,  MD .  oxyCODONE-acetaminophen (PERCOCET/ROXICET) 5-325 MG per tablet 1 tablet, 1 tablet, Oral, Q4H PRN, Agbata, Tochukwu, MD .  pantoprazole (PROTONIX) EC tablet 40 mg, 40 mg, Oral, Daily, Agbata, Tochukwu, MD .  QUEtiapine (SEROQUEL) tablet 400 mg, 400 mg, Oral, QHS, Agbata, Tochukwu, MD .  senna (SENOKOT) tablet 8.6 mg, 1 tablet, Oral, BID, Agbata, Tochukwu, MD  Current Outpatient Medications:  .  albuterol (PROVENTIL HFA;VENTOLIN HFA) 108 (90 BASE) MCG/ACT inhaler, Inhale 2 puffs into the lungs every 4 (four) hours as needed for wheezing or shortness of breath., Disp: , Rfl:  .  budesonide-formoterol (SYMBICORT) 80-4.5 MCG/ACT inhaler, Inhale 2 puffs into the lungs 2 (two) times daily., Disp: , Rfl:  .  busPIRone (BUSPAR) 10 MG  tablet, Take 10 mg by mouth 2 (two) times daily. Patient is unsure of dose, Disp: , Rfl:  .  furosemide (LASIX) 20 MG tablet, Take 20 mg by mouth 2 (two) times daily., Disp: , Rfl:  .  gabapentin (NEURONTIN) 300 MG capsule, Take 300 mg by mouth daily., Disp: , Rfl:  .  hydrOXYzine (ATARAX/VISTARIL) 25 MG tablet, hydroxyzine HCl 25 mg tablet, Disp: , Rfl:  .  ketorolac (TORADOL) 10 MG tablet, Take 1 tablet (10 mg total) by mouth every 6 (six) hours as needed for moderate pain or severe pain., Disp: 20 tablet, Rfl: 0 .  Meloxicam 10 MG CAPS, Take 1 capsule by mouth daily as needed., Disp: , Rfl:  .  ondansetron (ZOFRAN-ODT) 4 MG disintegrating tablet, Take 1 tablet (4 mg total) by mouth every 8 (eight) hours as needed., Disp: 20 tablet, Rfl: 0 .  oxyCODONE-acetaminophen (PERCOCET) 5-325 MG tablet, Take 1 tablet by mouth every 4 (four) hours as needed for severe pain., Disp: 20 tablet, Rfl: 0 .  pantoprazole (PROTONIX) 40 MG tablet, Take 40 mg by mouth daily., Disp: , Rfl:  .  prochlorperazine (COMPAZINE) 10 MG tablet, Take 10 mg by mouth every 6 (six) hours as needed for nausea or vomiting., Disp: , Rfl:  .  promethazine (PHENERGAN) 12.5 MG tablet, Take 1 tablet (12.5 mg total) by mouth every 6 (six) hours as needed for nausea or vomiting., Disp: 15 tablet, Rfl: 0 .  QUEtiapine (SEROQUEL) 400 MG tablet, Take 400 mg by mouth at bedtime., Disp: , Rfl:  .  doxycycline (VIBRAMYCIN) 100 MG capsule, Take 1 capsule (100 mg total) by mouth 2 (two) times daily. (Patient not taking: No sig reported), Disp: 20 capsule, Rfl: 0   Physical exam:  Vitals:   02/18/21 0850 02/18/21 0900 02/18/21 1000  BP:  (!) 160/98 (!) 162/111  Pulse:  99 100  Resp:  18 16  Temp:  97.9 F (36.6 C)   SpO2:  97% 98%  Weight: 120 lb (54.4 kg)    Height: 5\' 6"  (1.676 m)     Physical Exam Constitutional:      General: She is not in acute distress.    Appearance: She is not diaphoretic.  HENT:     Head: Normocephalic and  atraumatic.     Nose: Nose normal.     Mouth/Throat:     Pharynx: No oropharyngeal exudate.  Eyes:     General: No scleral icterus.    Pupils: Pupils are equal, round, and reactive to light.  Cardiovascular:     Rate and Rhythm: Normal rate and regular rhythm.     Heart sounds: No murmur heard.   Pulmonary:     Effort: Pulmonary effort is normal. No respiratory distress.  Breath sounds: No rales.     Comments: Decreased breath sound bilaterally Chest:     Chest wall: No tenderness.  Abdominal:     General: There is no distension.     Palpations: Abdomen is soft.     Tenderness: There is no abdominal tenderness.  Musculoskeletal:        General: Normal range of motion.     Cervical back: Normal range of motion and neck supple.  Skin:    General: Skin is warm and dry.     Findings: No erythema.  Neurological:     Mental Status: She is alert and oriented to person, place, and time.     Cranial Nerves: No cranial nerve deficit.     Motor: No abnormal muscle tone.     Coordination: Coordination normal.  Psychiatric:        Mood and Affect: Affect normal.         CMP Latest Ref Rng & Units 02/18/2021  Glucose 70 - 99 mg/dL 118(H)  BUN 6 - 20 mg/dL 18  Creatinine 0.44 - 1.00 mg/dL 0.82  Sodium 135 - 145 mmol/L 137  Potassium 3.5 - 5.1 mmol/L 4.3  Chloride 98 - 111 mmol/L 101  CO2 22 - 32 mmol/L 28  Calcium 8.9 - 10.3 mg/dL 8.9  Total Protein 6.5 - 8.1 g/dL 6.5  Total Bilirubin 0.3 - 1.2 mg/dL 0.9  Alkaline Phos 38 - 126 U/L 447(H)  AST 15 - 41 U/L 47(H)  ALT 0 - 44 U/L 77(H)   CBC Latest Ref Rng & Units 02/18/2021  WBC 4.0 - 10.5 K/uL 9.7  Hemoglobin 12.0 - 15.0 g/dL 11.7(L)  Hematocrit 36.0 - 46.0 % 34.7(L)  Platelets 150 - 400 K/uL 156    RADIOGRAPHIC STUDIES: I have personally reviewed the radiological images as listed and agreed with the findings in the report. DG Chest 2 View  Result Date: 02/15/2021 CLINICAL DATA:  Weight loss and cough EXAM: CHEST -  2 VIEW COMPARISON:  None. FINDINGS: The heart size and mediastinal contours are within normal limits. Rounded patchy nodular opacity seen within the right upper lobe and right mid lung. The left lung is clear. No acute osseous abnormality. IMPRESSION: Rounded patchy airspace opacities in the right mid lung which may be due to infectious etiology, however cannot exclude metastatic disease. Would recommend CT chest with contrast for further evaluation. Electronically Signed   By: Prudencio Pair M.D.   On: 02/15/2021 12:16   DG Lumbar Spine 2-3 Views  Result Date: 01/20/2021 CLINICAL DATA:  Low back pain EXAM: LUMBAR SPINE - 2-3 VIEW COMPARISON:  None. FINDINGS: Frontal, lateral, and spot lumbosacral lateral images were obtained. There are 5 non-rib-bearing lumbar type vertebral bodies. There is lumbar dextroscoliosis with rotatory component. There is no fracture or spondylolisthesis. There is disc space narrowing at all levels with vacuum phenomenon at L2-3. Facet arthropathy noted at all levels. There is aortic and iliac artery atherosclerosis. IMPRESSION: Scoliosis with multilevel arthropathy. No fracture or spondylolisthesis. Aortic Atherosclerosis (ICD10-I70.0). Electronically Signed   By: Lowella Grip III M.D.   On: 01/20/2021 18:21   CT Head Wo Contrast  Result Date: 02/15/2021 CLINICAL DATA:  Headache with left-sided face/neck/head pain. On antibiotics for reported laryngitis with bilateral ear pain. EXAM: CT HEAD WITHOUT CONTRAST CT MAXILLOFACIAL WITHOUT CONTRAST TECHNIQUE: Multidetector CT imaging of the head and maxillofacial structures were performed using the standard protocol without intravenous contrast. Multiplanar CT image reconstructions of the maxillofacial structures were also  generated. COMPARISON:  None. FINDINGS: CT HEAD FINDINGS Brain: The calvarial/skull base mass lesion is described below. Spur this process exerts mass effect on the left cerebellum with suspected narrowing of the  fourth ventricle. No evidence of hydrocephalus. No evidence of acute large vascular territory infarct. No acute hemorrhage. Mild to moderate scattered white matter hypodensities, which are nonspecific but most likely relate to chronic microvascular ischemic disease. No midline shift. Vascular: No hyperdense vessel identified. Calcific atherosclerosis. Skull/skull base: There is aggressive soft tissue mass lesion involving the left occipital calvarium measuring up to approximately 5.5 x 3.1 by 3.3 cm (transverse by AP by craniocaudal) with bony destruction and expansion and extension inferiorly to involve the left temporal bone and skull base. There is mass effect on the inferior left cerebellum and possible involvement of the subjacent dural venous sinuses. Small left mastoid effusion. CT MAXILLOFACIAL FINDINGS Osseous: No fracture or mandibular dislocation. No destructive process in the face. Orbits: Negative. No traumatic or inflammatory finding. Sinuses: Small right maxillary sinus retention cyst. Otherwise, sinuses are clear. Soft tissues: Negative. 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 Resulting mass effect on the left cerebellum and possible involvement of the subjacent dural venous sinuses. 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 Osteomyelitis is a differential consideration. Recommend MRI brain with and without contrast to further evaluate. Findings and recommendations discussed with Ashok Cordia, PA via telephone at 11:20 a.m. Electronically Signed   By: Margaretha Sheffield MD   On: 02/15/2021 11:29   CT Thoracic Spine Wo Contrast  Result Date: 02/18/2021 CLINICAL DATA:  Metastases on CT abdomen EXAM: CT THORACIC AND LUMBAR SPINE WITHOUT CONTRAST TECHNIQUE: Multidetector CT imaging of the thoracic and lumbar  spine was performed without contrast. Multiplanar CT image reconstructions were also generated. COMPARISON:  None. FINDINGS: CT THORACIC SPINE FINDINGS Alignment: Preserved. Vertebrae: There is a small partially sclerotic lesion at the right aspect of the T5 vertebral body. A lytic lesion is present at the anterior aspect T9 likely with pathologic fracture but no substantial height loss. Ill-defined lucent lesions are present elsewhere. Paraspinal and other soft tissues: Better evaluated on prior dedicated imaging. Disc levels: Multilevel degenerative changes. No high-grade degenerative canal narrowing. CT LUMBAR SPINE FINDINGS Segmentation: 5 lumbar type vertebrae. Alignment: Dextrocurvature.  Grade 1 anterolisthesis at L4-L5 Vertebrae: Lucency and sclerosis at L2 with pathologic compression fracture resulting in less than 50% loss of height at the superior endplate. Sclerotic lesion of the right sacrum. Paraspinal and other soft tissues: Better evaluated on prior dedicated imaging. Disc levels: Multilevel degenerative changes. Canal stenosis is greatest at L3-L4 and L4-L5. Foraminal narrowing is greatest on the right at L5-S1. IMPRESSION: Sclerotic and lytic metastatic lesions as already identified on the CT chest, abdomen, and pelvis. Pathologic L2 fracture with less than 50% loss of height. Probable pathologic fracture at T9 without height loss. No apparent significant epidural disease by CT. Electronically Signed   By: Macy Mis M.D.   On: 02/18/2021 10:53   CT Lumbar Spine Wo Contrast  Result Date: 02/18/2021 CLINICAL DATA:  Metastases on CT abdomen EXAM: CT THORACIC AND LUMBAR SPINE WITHOUT CONTRAST TECHNIQUE: Multidetector CT imaging of the thoracic and lumbar spine was performed without contrast. Multiplanar CT image reconstructions were also generated. COMPARISON:  None. FINDINGS: CT THORACIC SPINE FINDINGS Alignment: Preserved. Vertebrae: There is a small partially sclerotic  lesion at the right  aspect of the T5 vertebral body. A lytic lesion is present at the anterior aspect T9 likely with pathologic fracture but no substantial height loss. Ill-defined lucent lesions are present elsewhere. Paraspinal and other soft tissues: Better evaluated on prior dedicated imaging. Disc levels: Multilevel degenerative changes. No high-grade degenerative canal narrowing. CT LUMBAR SPINE FINDINGS Segmentation: 5 lumbar type vertebrae. Alignment: Dextrocurvature.  Grade 1 anterolisthesis at L4-L5 Vertebrae: Lucency and sclerosis at L2 with pathologic compression fracture resulting in less than 50% loss of height at the superior endplate. Sclerotic lesion of the right sacrum. Paraspinal and other soft tissues: Better evaluated on prior dedicated imaging. Disc levels: Multilevel degenerative changes. Canal stenosis is greatest at L3-L4 and L4-L5. Foraminal narrowing is greatest on the right at L5-S1. IMPRESSION: Sclerotic and lytic metastatic lesions as already identified on the CT chest, abdomen, and pelvis. Pathologic L2 fracture with less than 50% loss of height. Probable pathologic fracture at T9 without height loss. No apparent significant epidural disease by CT. Electronically Signed   By: Macy Mis M.D.   On: 02/18/2021 10:53   MR Brain W and Wo Contrast  Result Date: 02/15/2021 CLINICAL DATA:  Abnormal head CT.  Skull base mass. EXAM: MRI HEAD WITHOUT AND WITH CONTRAST TECHNIQUE: Multiplanar, multiecho pulse sequences of the brain and surrounding structures were obtained without and with intravenous contrast. CONTRAST:  11mL GADAVIST GADOBUTROL 1 MMOL/ML IV SOLN COMPARISON:  CT head 02/15/2021 FINDINGS: Brain: Large destructive mass in the posterior skull base bilaterally left greater than right. This involves the occipital bone with extension into the left temporal bone. The occipital bone is significantly expanded due to tumor on the left with mass-effect on the left cerebellum. Soft tissue mass associated  with left occipital lesion measures approximately 5.2 x 3.0 cm. Mild edema in the left cerebellum. There is infiltrating tumor in the left temporal bone which is more sizable than would be predicted from the CT. The soft tissue mass associated with the left occipital bone shows susceptibility which may be due to mineralization and/or bone fragments from bone destruction. Ventricle size normal. No midline shift. Moderate white matter changes likely due to chronic microvascular ischemia. No acute infarct. Chronic microhemorrhage in the left parietal lobe. Subtle enhancing lesions in the brain in the right frontal lobe measuring approximately 10 mm, and 4 mm best seen on coronal and sagittal postcontrast imaging. This is suspicious for metastatic disease. Vascular: Normal arterial flow voids. Normal venous enhancement. No evidence of venous sinus thrombosis. Skull and upper cervical spine: Infiltrating destructive mass in the occipital bone bilaterally left greater than right with extension into the left temporal bone. Additional lesions in the frontal bone bilaterally and in the left parietal bone likely metastatic disease. Sinuses/Orbits: Mild mucosal edema paranasal sinuses. Negative orbit Other: None IMPRESSION: Destructive mass lesion in the occipital bone bilaterally left greater than right. There is associated soft tissue mass in the left occipital bone extending into the posterior fossa with mass-effect and mild edema of the left cerebellum. This mass extends into the left temporal bone. Additional bone lesions are present in the frontal bones and left parietal bone. Subtle enhancing lesions right frontal lobe measuring 10 mm and 4 mm. Findings compatible with metastatic disease. Electronically Signed   By: Franchot Gallo M.D.   On: 02/15/2021 13:47   US Renal  Result Date: 02/18/2021 CLINICAL DATA:  Back pain. Right renal pelvis fullness on right upper quadrant ultrasound performed 1 day prior EXAM:  RENAL  / URINARY TRACT ULTRASOUND COMPLETE COMPARISON:  02/17/2021 abdominal sonogram. FINDINGS: Right Kidney: Renal measurements: 10.3 x 4.2 x 4.2 cm = volume: 95 mL. Normal parenchymal echogenicity and thickness. Mild right hydronephrosis. Possible 5 mm right ureteropelvic junction stone. Nonobstructing 3 mm lower right renal stone. No renal masses. Left Kidney: Renal measurements: 10.0 x 4.8 x 4.2 cm = volume: 107 mL. Normal parenchymal echogenicity and thickness. No left hydronephrosis. No renal masses. Probable nonobstructing 7 mm upper left renal stone. Bladder: Appears normal for degree of bladder distention. Bilateral ureteral jets seen in the bladder. Other: None. IMPRESSION: 1. Mild right hydronephrosis. Possible 5 mm right UPJ stone. Nonobstructing 3 mm lower right renal stone. Suggest unenhanced CT abdomen/pelvis for further evaluation. 2. Probable nonobstructing upper left renal stone. Electronically Signed   By: Ilona Sorrel M.D.   On: 02/18/2021 12:02   CT CHEST ABDOMEN PELVIS W CONTRAST  Result Date: 02/15/2021 CLINICAL DATA:  Altered mental status. 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 EXAM: CT CHEST, ABDOMEN, AND PELVIS WITH CONTRAST TECHNIQUE: Multidetector CT imaging of the chest, abdomen and pelvis was performed following the standard protocol during bolus administration of intravenous contrast. CONTRAST:  75 mL OMNIPAQUE IOHEXOL 300 MG/ML  SOLN COMPARISON:  None. FINDINGS: CT CHEST FINDINGS Cardiovascular: No significant vascular findings. Normal heart size. No pericardial effusion. Mediastinum/Nodes: The patient has extensive lymphadenopathy. A 2 cm left axillary node is seen on image 11 of series 2; a second left axillary node on image 14 measures 1.4 cm. A 1.1 cm subpectoral node is seen on the left on image 15 of series 2. A 1.4 cm left supraclavicular node is seen on image 9. Right paratracheal node on image 15 measures 1.9 cm. Right precarinal nodal mass on image 25  measures 2.6 cm and there is a right hilar nodal mass measuring 4.2 x 3.1 cm on image 30. The esophagus and thyroid are negative. Lungs/Pleura: Small right pleural effusion. A spiculated nodule in the right middle lobe measures 2.6 cm transverse by 2.7 cm AP on image 77, series 4. Scattered pulmonary nodules include a 0.5 cm nodule in the superior segment of the left lower lobe on image 80, 0.4 cm left upper lobe nodule on image 76 and 2 punctate nodules on image 83. Additional smaller nodules are seen scattered through both lungs. The lungs are emphysematous. No pleural effusion. Musculoskeletal: A 0.9 cm lytic lesion is seen in T5, lytic lesions are seen in T7 and T9, larger in T9. There is also a small lytic lesion in T10. CT ABDOMEN PELVIS FINDINGS Hepatobiliary: Metastatic lesions are seen in the left hepatic lobe measuring 1.7 cm on image 57, series 2 and near the dome of the liver on image 50 of series 2 where a 1.1 cm lesion is identified. A second lesion near the dome of the liver in the right hepatic lobe on image 50 measures 0.7 cm. There is mild intra and extrahepatic biliary ductal dilatation likely related to prior cholecystectomy. Pancreas: Unremarkable. No pancreatic ductal dilatation or surrounding inflammatory changes. Spleen: Normal in size without focal abnormality. Adrenals/Urinary Tract: Adrenal glands are unremarkable. Kidneys are normal, without renal calculi, focal lesion, or hydronephrosis. Bladder is unremarkable. Stomach/Bowel: Stomach is within normal limits. Appendix appears normal. No evidence of bowel wall thickening, distention, or inflammatory changes. Moderately large colonic stool burden throughout noted. Vascular/Lymphatic: Aortic atherosclerosis. No aneurysm. No pathologic lymphadenopathy by CT size criteria. Reproductive: Status post hysterectomy. No adnexal masses. Other:  None. Musculoskeletal: Mottled appearance of the L2 vertebral body is consistent with metastatic disease.  There is a mild pathologic superior endplate compression fracture of L2 with vertebral body height loss centrally of approximately 30%. IMPRESSION: Findings consistent with extensive metastatic carcinoma likely emanating from a 2.7 cm right middle lobe pulmonary nodule. Metastases include extensive lymphadenopathy in the chest,, pulmonary nodule bone lesions and liver lesions. Mild pathologic fracture of L2 with vertebral body height loss centrally of up to approximately 30% is again noted. Aortic Atherosclerosis (ICD10-I70.0) and Emphysema (ICD10-J43.9). Electronically Signed   By: Inge Rise M.D.   On: 02/15/2021 14:25   CT Maxillofacial Wo Contrast  Result Date: 02/15/2021 CLINICAL DATA:  Headache with left-sided face/neck/head pain. On antibiotics for reported laryngitis with bilateral ear pain. EXAM: CT HEAD WITHOUT CONTRAST CT MAXILLOFACIAL WITHOUT CONTRAST TECHNIQUE: Multidetector CT imaging of the head and maxillofacial structures were performed using the standard protocol without intravenous contrast. Multiplanar CT image reconstructions of the maxillofacial structures were also generated. COMPARISON:  None. FINDINGS: CT HEAD FINDINGS Brain: The calvarial/skull base mass lesion is described below. Spur this process exerts mass effect on the left cerebellum with suspected narrowing of the fourth ventricle. No evidence of hydrocephalus. No evidence of acute large vascular territory infarct. No acute hemorrhage. Mild to moderate scattered white matter hypodensities, which are nonspecific but most likely relate to chronic microvascular ischemic disease. No midline shift. Vascular: No hyperdense vessel identified. Calcific atherosclerosis. Skull/skull base: There is aggressive soft tissue mass lesion involving the left occipital calvarium measuring up to approximately 5.5 x 3.1 by 3.3 cm (transverse by AP by craniocaudal) with bony destruction and expansion and extension inferiorly to involve the left  temporal bone and skull base. There is mass effect on the inferior left cerebellum and possible involvement of the subjacent dural venous sinuses. Small left mastoid effusion. CT MAXILLOFACIAL FINDINGS Osseous: No fracture or mandibular dislocation. No destructive process in the face. Orbits: Negative. No traumatic or inflammatory finding. Sinuses: Small right maxillary sinus retention cyst. Otherwise, sinuses are clear. Soft tissues: Negative. 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 Resulting mass effect on the left cerebellum and possible involvement of the subjacent dural venous sinuses. 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 Osteomyelitis is a differential consideration. Recommend MRI brain with and without contrast to further evaluate. Findings and recommendations discussed with Ashok Cordia, PA via telephone at 11:20 a.m. Electronically Signed   By: Margaretha Sheffield MD   On: 02/15/2021 11:29   US Abdomen Limited RUQ (LIVER/GB)  Result Date: 02/17/2021 CLINICAL DATA:  Nausea and vomiting for 1 day. EXAM: ULTRASOUND ABDOMEN LIMITED RIGHT UPPER QUADRANT COMPARISON:  CT chest, abdomen and pelvis 02/15/2021. FINDINGS: Gallbladder: Removed. Common bile duct: Diameter: 0.8 cm. Liver: Three hypoechoic liver lesions measuring up to 1.8 cm are identified are identified and correlate with metastatic deposit seen on prior CT. Mild intrahepatic biliary ductal dilatation seen on CT is also noted. Portal vein is patent on color Doppler imaging with normal direction of blood flow towards the liver. Other: There is fullness of the right renal pelvis which is new since the patient's CT scan yesterday. IMPRESSION: Three hypoechoic liver lesions measuring up to 1.8 cm correlate with metastatic deposit seen on CT. Status post  cholecystectomy. Mild intra and extrahepatic biliary ductal dilatation is likely related to cholecystectomy. No  obstructing lesion is identified. Fullness of the right renal pelvis is new since yesterday's examination. This could be incidental. Correlation with dedicated renal ultrasound could be used for further evaluation if indicated. Electronically Signed   By: Inge Rise M.D.   On: 02/17/2021 12:53    Assessment and plan-   #2.7 right middle lobe mass with extensive lymphadenopathy, liver lesion, and bone lesion. Most likely metastatic cancer, needs to establish tissue diagnosis. Recommend IR biopsy of left supraclavicular lymphadenopathy or other sites if feasible.  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 Start patient on dexamethasone 8 mg twice daily. She will need palliative radiation. Continue antiemetics  #Pathological fracture of L2 with multiple bone lesions.  Neoplasm induced pain, history of substance abuse Patient reports not able to tolerate oral pain medication at this point. Currently on IV Dilaudid as needed.  Percocet 5/325 1 to 2 tablets every 4 hours as needed. I recommend to add a fentanyl patch 25 MCG/h, titrate up according to her symptoms.  #Goals of care was discussed.  Patient understands that her condition most likely is not curable.  Palliative treatments options will be discussed after tissue diagnosis is established. Consult palliative care service.  I discussed with [employer].   Thank you for allowing me to participate in the care of this patient.   Earlie Server, MD, PhD Hematology Oncology [facility] Pager- [phone] 02/18/2021

## 2021-02-18 NOTE — Telephone Encounter (Signed)
Patient caregiver called to report that patient is in the ER and will not make her appts today She was there yesterday as well but had to go back today due to nausea and vomiting and back pain making ambulation difficult.

## 2021-02-18 NOTE — Consult Note (Signed)
[place]  [phone] [phone]   Name: Brenda Middleton Date: 02/18/2021 MRN: [id number]  DOB: May 02, 1961  Patient Care Team: Remi Haggard, FNP as PCP - General (Family Medicine) Remi Haggard, FNP (Family Medicine) Christene Lye, MD (General Surgery) Telford Nab, RN as Oncology Nurse Navigator    REASON FOR CONSULTATION: Brenda Middleton is a 60 y.o. female with multiple medical problems including anxiety, nicotine dependence, hypertension, depression, and chronic low back pain.  Patient has several recent ER visits for intractable head and back pain.  MRI of the brain revealed destructive mass in the occipital bone bilaterally extending into the posterior fossa with mass-effect to the left cerebellum.  CT of the chest abdomen and pelvis revealed a large mass in the right middle lobe with widespread metastases involving lymphadenopathy in the chest, multiple pulmonary nodules, bone lesions, and liver lesions.  She was found to have a pathologic fracture of L2.  Palliative care was consulted help address goals and manage ongoing symptoms.  SOCIAL HISTORY:     reports that she has quit smoking. Her smoking use included cigarettes. She has a 24.50 pack-year smoking history. She has never used smokeless tobacco. She reports current alcohol use. She reports that she does not use drugs.  Patient is unmarried.  She has no children.  She lives at home with her sister.  She has another sister in [place].  Patient is disabled.  She has a history of substance use, primarily crack cocaine but reports sobriety over the past 6 years.  ADVANCE DIRECTIVES:  None on file  CODE STATUS: Full code  PAST MEDICAL HISTORY: Past Medical History:  Diagnosis Date  . Anxiety   . Benign neoplasm of cervix uteri   . Chronic hepatitis C without mention of hepatic coma   . Chronic low back pain 08/26/2014  . Constipation    . Depressive disorder   . Displacement of cervical intervertebral disc   . Hypertensive disorder   . Iron deficiency anemia due to chronic blood loss 09/12/2019  . Palpitations   . Prolapsed cervical intervertebral disc     PAST SURGICAL HISTORY:  Past Surgical History:  Procedure Laterality Date  . CONIZATION CERVIX    . DILATION AND CURETTAGE OF UTERUS    . HAND SURGERY  2008,2015   Carpel Tunnel  . TONSILLECTOMY    . VULVECTOMY      HEMATOLOGY/ONCOLOGY HISTORY:  Oncology History   No history exists.    ALLERGIES:  is allergic to penicillin g, penicillin v, and penicillins.  MEDICATIONS:  Current Facility-Administered Medications  Medication Dose Route Frequency Provider Last Rate Last Admin  . 0.9 %  sodium chloride infusion   Intravenous Continuous Agbata, Tochukwu, MD      . albuterol (VENTOLIN HFA) 108 (90 Base) MCG/ACT inhaler 2 puff  2 puff Inhalation Q4H PRN Agbata, Tochukwu, MD      . amLODipine (NORVASC) tablet 5 mg  5 mg Oral Daily Agbata, Tochukwu, MD      . busPIRone (BUSPAR) tablet 10 mg  10 mg Oral BID Agbata, Tochukwu, MD      . dexamethasone (DECADRON) tablet 8 mg  8 mg Oral Q12H Earlie Server, MD      . enoxaparin (LOVENOX) injection 40 mg  40 mg Subcutaneous Q24H Agbata, Tochukwu, MD      . fentaNYL (DURAGESIC) 25 MCG/HR 1 patch  1 patch Transdermal Q72H Borders, Kirt Boys, NP      .  gabapentin (NEURONTIN) capsule 300 mg  300 mg Oral Daily Agbata, Tochukwu, MD      . hydrALAZINE (APRESOLINE) injection 10 mg  10 mg Intravenous Q6H PRN Agbata, Tochukwu, MD      . HYDROmorphone (DILAUDID) injection 1 mg  1 mg Intravenous Q4H PRN Agbata, Tochukwu, MD      . hydrOXYzine (ATARAX/VISTARIL) tablet 25 mg  25 mg Oral Q6H PRN Agbata, Tochukwu, MD      . lidocaine (LIDODERM) 5 % 1 patch  1 patch Transdermal Q24H Lucrezia Starch, MD   1 patch at 02/18/21 [phone]  . mometasone-formoterol (DULERA) 100-5 MCG/ACT inhaler 2 puff  2 puff Inhalation BID Agbata, Tochukwu, MD      .  ondansetron (ZOFRAN) tablet 4 mg  4 mg Oral Q6H PRN Agbata, Tochukwu, MD       Or  . ondansetron (ZOFRAN) injection 4 mg  4 mg Intravenous Q6H PRN Agbata, Tochukwu, MD      . oxyCODONE-acetaminophen (PERCOCET/ROXICET) 5-325 MG per tablet 1-2 tablet  1-2 tablet Oral Q4H PRN Borders, Kirt Boys, NP      . pantoprazole (PROTONIX) EC tablet 40 mg  40 mg Oral Daily Agbata, Tochukwu, MD      . QUEtiapine (SEROQUEL) tablet 400 mg  400 mg Oral QHS Agbata, Tochukwu, MD      . senna (SENOKOT) tablet 8.6 mg  1 tablet Oral BID Agbata, Tochukwu, MD       Current Outpatient Medications  Medication Sig Dispense Refill  . albuterol (PROVENTIL HFA;VENTOLIN HFA) 108 (90 BASE) MCG/ACT inhaler Inhale 2 puffs into the lungs every 4 (four) hours as needed for wheezing or shortness of breath.    . budesonide-formoterol (SYMBICORT) 80-4.5 MCG/ACT inhaler Inhale 2 puffs into the lungs 2 (two) times daily.    . busPIRone (BUSPAR) 10 MG tablet Take 10 mg by mouth 2 (two) times daily. Patient is unsure of dose    . furosemide (LASIX) 20 MG tablet Take 20 mg by mouth 2 (two) times daily.    Marland Kitchen gabapentin (NEURONTIN) 300 MG capsule Take 300 mg by mouth daily.    . hydrOXYzine (ATARAX/VISTARIL) 25 MG tablet hydroxyzine HCl 25 mg tablet    . ketorolac (TORADOL) 10 MG tablet Take 1 tablet (10 mg total) by mouth every 6 (six) hours as needed for moderate pain or severe pain. 20 tablet 0  . Meloxicam 10 MG CAPS Take 1 capsule by mouth daily as needed.    . ondansetron (ZOFRAN-ODT) 4 MG disintegrating tablet Take 1 tablet (4 mg total) by mouth every 8 (eight) hours as needed. 20 tablet 0  . oxyCODONE-acetaminophen (PERCOCET) 5-325 MG tablet Take 1 tablet by mouth every 4 (four) hours as needed for severe pain. 20 tablet 0  . pantoprazole (PROTONIX) 40 MG tablet Take 40 mg by mouth daily.    . prochlorperazine (COMPAZINE) 10 MG tablet Take 10 mg by mouth every 6 (six) hours as needed for nausea or vomiting.    . promethazine  (PHENERGAN) 12.5 MG tablet Take 1 tablet (12.5 mg total) by mouth every 6 (six) hours as needed for nausea or vomiting. 15 tablet 0  . QUEtiapine (SEROQUEL) 400 MG tablet Take 400 mg by mouth at bedtime.    Marland Kitchen doxycycline (VIBRAMYCIN) 100 MG capsule Take 1 capsule (100 mg total) by mouth 2 (two) times daily. (Patient not taking: No sig reported) 20 capsule 0    VITAL SIGNS: BP (!) 181/90   Pulse 91   Temp  98 F (36.7 C) (Oral)   Resp 17   Ht $R'5\' 6"'cr$  (1.676 m)   Wt 120 lb (54.4 kg)   SpO2 97%   BMI 19.37 kg/m  Filed Weights   02/18/21 0850  Weight: 120 lb (54.4 kg)    Estimated body mass index is 19.37 kg/m as calculated from the following:   Height as of this encounter: $RemoveBeforeD'5\' 6"'xDfxSaVClKhAEh$  (1.676 m).   Weight as of this encounter: 120 lb (54.4 kg).  LABS: CBC:    Component Value Date/Time   WBC 9.7 02/18/2021 0934   HGB 11.7 (L) 02/18/2021 0934   HGB 13.6 11/04/2013 1650   HCT 34.7 (L) 02/18/2021 0934   HCT 40.2 11/04/2013 1650   PLT 156 02/18/2021 0934   PLT 136 (L) 11/04/2013 1650   MCV 85.7 02/18/2021 0934   MCV 90 11/04/2013 1650   NEUTROABS 7.6 02/18/2021 0934   NEUTROABS 3.5 10/12/2014 1019   LYMPHSABS 1.2 02/18/2021 0934   LYMPHSABS 3.2 (H) 10/12/2014 1019   MONOABS 0.7 02/18/2021 0934   EOSABS 0.2 02/18/2021 0934   EOSABS 0.4 10/12/2014 1019   BASOSABS 0.0 02/18/2021 0934   BASOSABS 0.0 10/12/2014 1019   Comprehensive Metabolic Panel:    Component Value Date/Time   NA 137 02/18/2021 0934   NA 142 11/05/2013 0408   K 4.3 02/18/2021 0934   K 3.5 11/05/2013 0408   CL 101 02/18/2021 0934   CL 113 (H) 11/05/2013 0408   CO2 28 02/18/2021 0934   CO2 22 11/05/2013 0408   BUN 18 02/18/2021 0934   BUN 10 11/05/2013 0408   CREATININE 0.82 02/18/2021 0934   CREATININE 0.61 11/05/2013 0408   GLUCOSE 118 (H) 02/18/2021 0934   GLUCOSE 84 11/05/2013 0408   CALCIUM 8.9 02/18/2021 0934   CALCIUM 8.3 (L) 11/05/2013 0408   AST 47 (H) 02/18/2021 0934   AST 34 11/04/2013 1650    ALT 77 (H) 02/18/2021 0934   ALT 43 11/04/2013 1650   ALKPHOS 447 (H) 02/18/2021 0934   ALKPHOS 132 (H) 11/04/2013 1650   BILITOT 0.9 02/18/2021 0934   BILITOT 0.2 11/04/2013 1650   PROT 6.5 02/18/2021 0934   PROT 6.6 10/12/2014 1019   PROT 7.3 11/04/2013 1650   ALBUMIN 3.2 (L) 02/18/2021 0934   ALBUMIN 4.1 10/12/2014 1019   ALBUMIN 3.4 11/04/2013 1650    RADIOGRAPHIC STUDIES: DG Chest 2 View  Result Date: 02/15/2021 CLINICAL DATA:  Weight loss and cough EXAM: CHEST - 2 VIEW COMPARISON:  None. FINDINGS: The heart size and mediastinal contours are within normal limits. Rounded patchy nodular opacity seen within the right upper lobe and right mid lung. The left lung is clear. No acute osseous abnormality. IMPRESSION: Rounded patchy airspace opacities in the right mid lung which may be due to infectious etiology, however cannot exclude metastatic disease. Would recommend CT chest with contrast for further evaluation. Electronically Signed   By: Prudencio Pair M.D.   On: 02/15/2021 12:16   DG Lumbar Spine 2-3 Views  Result Date: 01/20/2021 CLINICAL DATA:  Low back pain EXAM: LUMBAR SPINE - 2-3 VIEW COMPARISON:  None. FINDINGS: Frontal, lateral, and spot lumbosacral lateral images were obtained. There are 5 non-rib-bearing lumbar type vertebral bodies. There is lumbar dextroscoliosis with rotatory component. There is no fracture or spondylolisthesis. There is disc space narrowing at all levels with vacuum phenomenon at L2-3. Facet arthropathy noted at all levels. There is aortic and iliac artery atherosclerosis. IMPRESSION: Scoliosis with multilevel arthropathy. No fracture  or spondylolisthesis. Aortic Atherosclerosis (ICD10-I70.0). Electronically Signed   By: Lowella Grip III M.D.   On: 01/20/2021 18:21   CT Head Wo Contrast  Result Date: 02/15/2021 CLINICAL DATA:  Headache with left-sided face/neck/head pain. On antibiotics for reported laryngitis with bilateral ear pain. EXAM: CT HEAD  WITHOUT CONTRAST CT MAXILLOFACIAL WITHOUT CONTRAST TECHNIQUE: Multidetector CT imaging of the head and maxillofacial structures were performed using the standard protocol without intravenous contrast. Multiplanar CT image reconstructions of the maxillofacial structures were also generated. COMPARISON:  None. FINDINGS: CT HEAD FINDINGS Brain: The calvarial/skull base mass lesion is described below. Spur this process exerts mass effect on the left cerebellum with suspected narrowing of the fourth ventricle. No evidence of hydrocephalus. No evidence of acute large vascular territory infarct. No acute hemorrhage. Mild to moderate scattered white matter hypodensities, which are nonspecific but most likely relate to chronic microvascular ischemic disease. No midline shift. Vascular: No hyperdense vessel identified. Calcific atherosclerosis. Skull/skull base: There is aggressive soft tissue mass lesion involving the left occipital calvarium measuring up to approximately 5.5 x 3.1 by 3.3 cm (transverse by AP by craniocaudal) with bony destruction and expansion and extension inferiorly to involve the left temporal bone and skull base. There is mass effect on the inferior left cerebellum and possible involvement of the subjacent dural venous sinuses. Small left mastoid effusion. CT MAXILLOFACIAL FINDINGS Osseous: No fracture or mandibular dislocation. No destructive process in the face. Orbits: Negative. No traumatic or inflammatory finding. Sinuses: Small right maxillary sinus retention cyst. Otherwise, sinuses are clear. Soft tissues: Negative. 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 Resulting mass effect on the left cerebellum and possible involvement of the subjacent dural venous sinuses. 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  Osteomyelitis is a differential consideration. Recommend MRI brain with and without contrast to further evaluate. Findings and recommendations discussed with Ashok Cordia, PA via telephone at 11:20 a.m. Electronically Signed   By: Margaretha Sheffield MD   On: 02/15/2021 11:29   CT Thoracic Spine Wo Contrast  Result Date: 02/18/2021 CLINICAL DATA:  Metastases on CT abdomen EXAM: CT THORACIC AND LUMBAR SPINE WITHOUT CONTRAST TECHNIQUE: Multidetector CT imaging of the thoracic and lumbar spine was performed without contrast. Multiplanar CT image reconstructions were also generated. COMPARISON:  None. FINDINGS: CT THORACIC SPINE FINDINGS Alignment: Preserved. Vertebrae: There is a small partially sclerotic lesion at the right aspect of the T5 vertebral body. A lytic lesion is present at the anterior aspect T9 likely with pathologic fracture but no substantial height loss. Ill-defined lucent lesions are present elsewhere. Paraspinal and other soft tissues: Better evaluated on prior dedicated imaging. Disc levels: Multilevel degenerative changes. No high-grade degenerative canal narrowing. CT LUMBAR SPINE FINDINGS Segmentation: 5 lumbar type vertebrae. Alignment: Dextrocurvature.  Grade 1 anterolisthesis at L4-L5 Vertebrae: Lucency and sclerosis at L2 with pathologic compression fracture resulting in less than 50% loss of height at the superior endplate. Sclerotic lesion of the right sacrum. Paraspinal and other soft tissues: Better evaluated on prior dedicated imaging. Disc levels: Multilevel degenerative changes. Canal stenosis is greatest at L3-L4 and L4-L5. Foraminal narrowing is greatest on the right at L5-S1. IMPRESSION: Sclerotic and lytic metastatic lesions as already identified on the CT chest, abdomen, and pelvis. Pathologic L2 fracture with less than 50% loss of height. Probable pathologic fracture at T9 without height loss.  No apparent significant epidural disease by CT. Electronically Signed   By: Macy Mis M.D.   On: 02/18/2021 10:53   CT Lumbar Spine Wo Contrast  Result Date: 02/18/2021 CLINICAL DATA:  Metastases on CT abdomen EXAM: CT THORACIC AND LUMBAR SPINE WITHOUT CONTRAST TECHNIQUE: Multidetector CT imaging of the thoracic and lumbar spine was performed without contrast. Multiplanar CT image reconstructions were also generated. COMPARISON:  None. FINDINGS: CT THORACIC SPINE FINDINGS Alignment: Preserved. Vertebrae: There is a small partially sclerotic lesion at the right aspect of the T5 vertebral body. A lytic lesion is present at the anterior aspect T9 likely with pathologic fracture but no substantial height loss. Ill-defined lucent lesions are present elsewhere. Paraspinal and other soft tissues: Better evaluated on prior dedicated imaging. Disc levels: Multilevel degenerative changes. No high-grade degenerative canal narrowing. CT LUMBAR SPINE FINDINGS Segmentation: 5 lumbar type vertebrae. Alignment: Dextrocurvature.  Grade 1 anterolisthesis at L4-L5 Vertebrae: Lucency and sclerosis at L2 with pathologic compression fracture resulting in less than 50% loss of height at the superior endplate. Sclerotic lesion of the right sacrum. Paraspinal and other soft tissues: Better evaluated on prior dedicated imaging. Disc levels: Multilevel degenerative changes. Canal stenosis is greatest at L3-L4 and L4-L5. Foraminal narrowing is greatest on the right at L5-S1. IMPRESSION: Sclerotic and lytic metastatic lesions as already identified on the CT chest, abdomen, and pelvis. Pathologic L2 fracture with less than 50% loss of height. Probable pathologic fracture at T9 without height loss. No apparent significant epidural disease by CT. Electronically Signed   By: Macy Mis M.D.   On: 02/18/2021 10:53   MR Brain W and Wo Contrast  Result Date: 02/15/2021 CLINICAL DATA:  Abnormal head CT.  Skull base mass. EXAM: MRI HEAD WITHOUT AND WITH CONTRAST TECHNIQUE: Multiplanar, multiecho pulse sequences of the  brain and surrounding structures were obtained without and with intravenous contrast. CONTRAST:  41mL GADAVIST GADOBUTROL 1 MMOL/ML IV SOLN COMPARISON:  CT head 02/15/2021 FINDINGS: Brain: Large destructive mass in the posterior skull base bilaterally left greater than right. This involves the occipital bone with extension into the left temporal bone. The occipital bone is significantly expanded due to tumor on the left with mass-effect on the left cerebellum. Soft tissue mass associated with left occipital lesion measures approximately 5.2 x 3.0 cm. Mild edema in the left cerebellum. There is infiltrating tumor in the left temporal bone which is more sizable than would be predicted from the CT. The soft tissue mass associated with the left occipital bone shows susceptibility which may be due to mineralization and/or bone fragments from bone destruction. Ventricle size normal. No midline shift. Moderate white matter changes likely due to chronic microvascular ischemia. No acute infarct. Chronic microhemorrhage in the left parietal lobe. Subtle enhancing lesions in the brain in the right frontal lobe measuring approximately 10 mm, and 4 mm best seen on coronal and sagittal postcontrast imaging. This is suspicious for metastatic disease. Vascular: Normal arterial flow voids. Normal venous enhancement. No evidence of venous sinus thrombosis. Skull and upper cervical spine: Infiltrating destructive mass in the occipital bone bilaterally left greater than right with extension into the left temporal bone. Additional lesions in the frontal bone bilaterally and in the left parietal bone likely metastatic disease. Sinuses/Orbits: Mild mucosal edema paranasal sinuses. Negative orbit Other: None IMPRESSION: Destructive mass lesion in the occipital bone bilaterally left greater than right. There is associated soft tissue mass in the left occipital bone extending into the posterior fossa with mass-effect and  mild edema of the  left cerebellum. This mass extends into the left temporal bone. Additional bone lesions are present in the frontal bones and left parietal bone. Subtle enhancing lesions right frontal lobe measuring 10 mm and 4 mm. Findings compatible with metastatic disease. Electronically Signed   By: Franchot Gallo M.D.   On: 02/15/2021 13:47   US Renal  Result Date: 02/18/2021 CLINICAL DATA:  Back pain. Right renal pelvis fullness on right upper quadrant ultrasound performed 1 day prior EXAM: RENAL / URINARY TRACT ULTRASOUND COMPLETE COMPARISON:  02/17/2021 abdominal sonogram. FINDINGS: Right Kidney: Renal measurements: 10.3 x 4.2 x 4.2 cm = volume: 95 mL. Normal parenchymal echogenicity and thickness. Mild right hydronephrosis. Possible 5 mm right ureteropelvic junction stone. Nonobstructing 3 mm lower right renal stone. No renal masses. Left Kidney: Renal measurements: 10.0 x 4.8 x 4.2 cm = volume: 107 mL. Normal parenchymal echogenicity and thickness. No left hydronephrosis. No renal masses. Probable nonobstructing 7 mm upper left renal stone. Bladder: Appears normal for degree of bladder distention. Bilateral ureteral jets seen in the bladder. Other: None. IMPRESSION: 1. Mild right hydronephrosis. Possible 5 mm right UPJ stone. Nonobstructing 3 mm lower right renal stone. Suggest unenhanced CT abdomen/pelvis for further evaluation. 2. Probable nonobstructing upper left renal stone. Electronically Signed   By: Ilona Sorrel M.D.   On: 02/18/2021 12:02   CT CHEST ABDOMEN PELVIS W CONTRAST  Result Date: 02/15/2021 CLINICAL DATA:  Altered mental status. 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 EXAM: CT CHEST, ABDOMEN, AND PELVIS WITH CONTRAST TECHNIQUE: Multidetector CT imaging of the chest, abdomen and pelvis was performed following the standard protocol during bolus administration of intravenous contrast. CONTRAST:  75 mL OMNIPAQUE IOHEXOL 300 MG/ML  SOLN COMPARISON:  None. FINDINGS: CT CHEST FINDINGS  Cardiovascular: No significant vascular findings. Normal heart size. No pericardial effusion. Mediastinum/Nodes: The patient has extensive lymphadenopathy. A 2 cm left axillary node is seen on image 11 of series 2; a second left axillary node on image 14 measures 1.4 cm. A 1.1 cm subpectoral node is seen on the left on image 15 of series 2. A 1.4 cm left supraclavicular node is seen on image 9. Right paratracheal node on image 15 measures 1.9 cm. Right precarinal nodal mass on image 25 measures 2.6 cm and there is a right hilar nodal mass measuring 4.2 x 3.1 cm on image 30. The esophagus and thyroid are negative. Lungs/Pleura: Small right pleural effusion. A spiculated nodule in the right middle lobe measures 2.6 cm transverse by 2.7 cm AP on image 77, series 4. Scattered pulmonary nodules include a 0.5 cm nodule in the superior segment of the left lower lobe on image 80, 0.4 cm left upper lobe nodule on image 76 and 2 punctate nodules on image 83. Additional smaller nodules are seen scattered through both lungs. The lungs are emphysematous. No pleural effusion. Musculoskeletal: A 0.9 cm lytic lesion is seen in T5, lytic lesions are seen in T7 and T9, larger in T9. There is also a small lytic lesion in T10. CT ABDOMEN PELVIS FINDINGS Hepatobiliary: Metastatic lesions are seen in the left hepatic lobe measuring 1.7 cm on image 57, series 2 and near the dome of the liver on image 50 of series 2 where a 1.1 cm lesion is identified. A second lesion near the dome of the liver in the right hepatic lobe on image 50 measures 0.7 cm. There is mild intra and extrahepatic biliary ductal dilatation likely related  to prior cholecystectomy. Pancreas: Unremarkable. No pancreatic ductal dilatation or surrounding inflammatory changes. Spleen: Normal in size without focal abnormality. Adrenals/Urinary Tract: Adrenal glands are unremarkable. Kidneys are normal, without renal calculi, focal lesion, or hydronephrosis. Bladder is  unremarkable. Stomach/Bowel: Stomach is within normal limits. Appendix appears normal. No evidence of bowel wall thickening, distention, or inflammatory changes. Moderately large colonic stool burden throughout noted. Vascular/Lymphatic: Aortic atherosclerosis. No aneurysm. No pathologic lymphadenopathy by CT size criteria. Reproductive: Status post hysterectomy. No adnexal masses. Other: None. Musculoskeletal: Mottled appearance of the L2 vertebral body is consistent with metastatic disease. There is a mild pathologic superior endplate compression fracture of L2 with vertebral body height loss centrally of approximately 30%. IMPRESSION: Findings consistent with extensive metastatic carcinoma likely emanating from a 2.7 cm right middle lobe pulmonary nodule. Metastases include extensive lymphadenopathy in the chest,, pulmonary nodule bone lesions and liver lesions. Mild pathologic fracture of L2 with vertebral body height loss centrally of up to approximately 30% is again noted. Aortic Atherosclerosis (ICD10-I70.0) and Emphysema (ICD10-J43.9). Electronically Signed   By: Inge Rise M.D.   On: 02/15/2021 14:25   CT Maxillofacial Wo Contrast  Result Date: 02/15/2021 CLINICAL DATA:  Headache with left-sided face/neck/head pain. On antibiotics for reported laryngitis with bilateral ear pain. EXAM: CT HEAD WITHOUT CONTRAST CT MAXILLOFACIAL WITHOUT CONTRAST TECHNIQUE: Multidetector CT imaging of the head and maxillofacial structures were performed using the standard protocol without intravenous contrast. Multiplanar CT image reconstructions of the maxillofacial structures were also generated. COMPARISON:  None. FINDINGS: CT HEAD FINDINGS Brain: The calvarial/skull base mass lesion is described below. Spur this process exerts mass effect on the left cerebellum with suspected narrowing of the fourth ventricle. No evidence of hydrocephalus. No evidence of acute large vascular territory infarct. No acute hemorrhage.  Mild to moderate scattered white matter hypodensities, which are nonspecific but most likely relate to chronic microvascular ischemic disease. No midline shift. Vascular: No hyperdense vessel identified. Calcific atherosclerosis. Skull/skull base: There is aggressive soft tissue mass lesion involving the left occipital calvarium measuring up to approximately 5.5 x 3.1 by 3.3 cm (transverse by AP by craniocaudal) with bony destruction and expansion and extension inferiorly to involve the left temporal bone and skull base. There is mass effect on the inferior left cerebellum and possible involvement of the subjacent dural venous sinuses. Small left mastoid effusion. CT MAXILLOFACIAL FINDINGS Osseous: No fracture or mandibular dislocation. No destructive process in the face. Orbits: Negative. No traumatic or inflammatory finding. Sinuses: Small right maxillary sinus retention cyst. Otherwise, sinuses are clear. Soft tissues: Negative. 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 Resulting mass effect on the left cerebellum and possible involvement of the subjacent dural venous sinuses. 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 Osteomyelitis is a differential consideration. Recommend MRI brain with and without contrast to further evaluate. Findings and recommendations discussed with Ashok Cordia, PA via telephone at 11:20 a.m. Electronically Signed   By: Margaretha Sheffield MD   On: 02/15/2021 11:29   US Abdomen Limited RUQ (LIVER/GB)  Result Date: 02/17/2021 CLINICAL DATA:  Nausea and vomiting for 1 day. EXAM: ULTRASOUND ABDOMEN LIMITED RIGHT UPPER QUADRANT COMPARISON:  CT chest, abdomen and pelvis 02/15/2021. FINDINGS: Gallbladder: Removed. Common bile duct: Diameter: 0.8 cm. Liver: Three hypoechoic liver lesions measuring up to  1.8 cm are identified are identified and correlate with  metastatic deposit seen on prior CT. Mild intrahepatic biliary ductal dilatation seen on CT is also noted. Portal vein is patent on color Doppler imaging with normal direction of blood flow towards the liver. Other: There is fullness of the right renal pelvis which is new since the patient's CT scan yesterday. IMPRESSION: Three hypoechoic liver lesions measuring up to 1.8 cm correlate with metastatic deposit seen on CT. Status post cholecystectomy. Mild intra and extrahepatic biliary ductal dilatation is likely related to cholecystectomy. No obstructing lesion is identified. Fullness of the right renal pelvis is new since yesterday's examination. This could be incidental. Correlation with dedicated renal ultrasound could be used for further evaluation if indicated. Electronically Signed   By: Inge Rise M.D.   On: 02/17/2021 12:53    PERFORMANCE STATUS (ECOG) : 1 - Symptomatic but completely ambulatory  Review of Systems Unless otherwise noted, a complete review of systems is negative.  Physical Exam General: NAD Pulmonary: Unlabored Extremities: no edema, no joint deformities Skin: no rashes Neurological: Weakness but otherwise nonfocal  IMPRESSION: I met with patient and sister in the ER.  She was admitted for intractable pain.  Patient reports that pain is currently 8 out of 10 primarily in the head and low back at known sites of bony metastasis.  Patient was started on dexamethasone by Dr. Tasia Catchings today.  She has received several doses of IV hydromorphone and Percocet but has not had significant improvement in pain.  Note the patient has history of chronic low back pain for which she has been followed by multiple pain clinics.  Per chart review, it appears that patient was dismissed from [facility] pain clinic due to poor medication compliance.  She was also dismissed from neurology due to poor compliance.  Patient also has history of being  followed by [facility] clinic.  Most recently, patient says that she was followed by [facility] clinic in [place] but was in process of transitioning care to [facility].   Patient admits to history of illicit drug use.  Primarily, she used cocaine but denies any illicit use in many years.  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  This neoplasm related pain is expected to be ongoing in addition to her other chronic pain and will likely require long-term management with opioid medications.  Patient has also been started on steroids, which should help with inflammatory pain.  Patient will need to be closely followed in the outpatient setting given her history.  Case discussed at length with Dr. Tasia Catchings.  We will start patient on transdermal fentanyl for long-acting control.  We will liberalize Percocet for breakthrough pain.  Patient will require aggressive bowel regimen to prevent opioid-induced constipation.  Patient also endorses persistent nausea without vomiting.  We will schedule Zofran.  She may also find improvement with nausea on steroids.  Patient asked candidly about her life expectancy with the understanding that this cancer would not be curable.  Patient is pending biopsy and we should be able to provide her with better prognostic information once cancer type is known.  PDMP reviewed  PLAN: -Continue current scope of treatment -Patient has been started on dexamethasone -Start transdermal fentanyl 25 mcg every 72 hours -Liberalize Percocet 1 to 2 tablets every 4 hours as needed for breakthrough pain -Daily bowel regimen -Will plan to follow patient in the clinic after discharge from the hospital  Case and plan discussed with Dr. Tasia Catchings   Time Total: 60 minutes  Visit consisted of counseling  and education dealing with the complex and emotionally intense issues of symptom management and palliative care in the setting of serious and potentially life-threatening  illness.Greater than 50%  of this time was spent counseling and coordinating care related to the above assessment and plan.  Signed by: Altha Harm, PhD, NP-C

## 2021-02-18 NOTE — Progress Notes (Signed)
Pt admitted to room 134. Pt oriented to room and equipment. All questions and concerns answered at this time. Pts VS are below:    02/18/21 1533  Vitals  Temp 97.8 F (36.6 C)  BP (!) 163/84  MAP (mmHg) 105  BP Location Left Arm  BP Method Automatic  Patient Position (if appropriate) Lying  Pulse Rate 98  Resp 18  MEWS COLOR  MEWS Score Color Green  Oxygen Therapy  SpO2 96 %  O2 Device Room [employer]

## 2021-02-18 NOTE — ED Notes (Signed)
When this RN let pt know I had some oral meds to give her she stated "I cant swallow". She requested pain medication and that was given by breaking the percocet on the scored line and administering in applesauce but pt is refusing all other oral meds at this time. This RN will update IP MD.

## 2021-02-18 NOTE — ED Provider Notes (Signed)
[facility] Emergency Department Provider Note  ____________________________________________   Event Date/Time   First MD Initiated Contact with Patient 02/18/21 [phone]     (approximate)  I have reviewed the triage vital signs and the nursing notes.   HISTORY  Chief Complaint Back Pain   HPI Brenda Middleton is a 60 y.o. 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  She was seen yesterday for some nausea and vomiting was discharged after droperidol and Phenergan after she was feeling better.  She states she still nauseous feels like her pain is uncontrolled because she cannot keep her pain meds down.  She states she has had pain in her lower back for several months but it seems of gotten worse in the last couple days and especially worse today.  She endorses chronic headache without any change today.  She denies any acute vision changes, chest pain, shortness of breath, abdominal pain         Past Medical History:  Diagnosis Date  . Anxiety   . Benign neoplasm of cervix uteri   . Chronic hepatitis C without mention of hepatic coma   . Chronic low back pain 08/26/2014  . Constipation   . Depressive disorder   . Displacement of cervical intervertebral disc   . Hypertensive disorder   . Iron deficiency anemia due to chronic blood loss 09/12/2019  .  Palpitations   . Prolapsed cervical intervertebral disc     Patient Active Problem List   Diagnosis Date Noted  . Intractable low back pain 02/18/2021  . Anxiety   . Depressive disorder   . Pathologic fracture of lumbar vertebra, initial encounter   . 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 COPD (chronic obstructive pulmonary disease) ([place])   . GERD (gastroesophageal reflux disease)   . Iron deficiency anemia due to chronic blood loss 09/12/2019  . Family history of cancer 09/12/2019  . Blood in the stool 09/12/2019  . Elevated alkaline phosphatase level 09/12/2019  . Chronic low back pain 08/26/2014    Past Surgical History:  Procedure Laterality Date  . CONIZATION CERVIX    . DILATION AND CURETTAGE OF UTERUS    . HAND SURGERY  2008,2015   Carpel Tunnel  . TONSILLECTOMY    . VULVECTOMY      Prior to Admission medications   Medication Sig Start Date End Date Taking? Authorizing Provider  albuterol (PROVENTIL HFA;VENTOLIN HFA) 108 (90 BASE) MCG/ACT inhaler Inhale 2 puffs into the lungs every 4 (four) hours as needed for wheezing or shortness of breath.   Yes [provider]  budesonide-formoterol (SYMBICORT) 80-4.5 MCG/ACT inhaler Inhale 2 puffs into the lungs 2 (two) times daily.   Yes [provider]  busPIRone (BUSPAR) 10 MG tablet Take 10 mg by mouth 2 (two) times daily. Patient is unsure of dose   Yes [provider]  furosemide (LASIX) 20 MG tablet Take 20 mg by mouth 2 (two) times daily. 01/28/21  Yes [provider]  gabapentin (NEURONTIN) 300 MG capsule Take 300 mg by mouth daily. 02/04/21  Yes [provider]  hydrOXYzine (ATARAX/VISTARIL) 25 MG tablet hydroxyzine HCl 25 mg tablet   Yes [provider]  ketorolac (TORADOL) 10 MG tablet Take 1 tablet (10 mg total) by mouth every 6 (six) hours as needed for moderate pain or severe pain. 02/13/21  Yes Cook, Jayce G, DO  Meloxicam 10 MG CAPS Take 1 capsule by mouth daily as  needed.   Yes [provider]  ondansetron (ZOFRAN-ODT) 4 MG disintegrating tablet Take 1 tablet (4 mg total) by mouth every 8 (eight) hours as needed. 02/15/21  Yes Fisher, Linden Dolin, PA-C  oxyCODONE-acetaminophen (PERCOCET) 5-325 MG tablet Take 1 tablet by mouth every 4 (four) hours as needed for severe pain. 02/15/21 02/15/22 Yes Fisher, Linden Dolin, PA-C  pantoprazole (PROTONIX) 40 MG tablet Take 40 mg by mouth daily. 02/08/21  Yes [provider]  prochlorperazine (COMPAZINE) 10 MG tablet Take 10 mg by mouth every 6 (six) hours as needed for nausea or vomiting.   Yes [provider]  promethazine (PHENERGAN) 12.5 MG tablet Take 1 tablet (12.5 mg total) by mouth every 6 (six) hours as needed for nausea or vomiting. 02/17/21  Yes Blake Divine, MD  QUEtiapine (SEROQUEL) 400 MG tablet Take 400 mg by mouth at bedtime.   Yes [provider]  doxycycline (VIBRAMYCIN) 100 MG capsule Take 1 capsule (100 mg total) by mouth 2 (two) times daily. Patient not taking: No sig reported 02/12/21   Coral Spikes, DO  amitriptyline (ELAVIL) 25 MG tablet Take 2 tablets (50 mg total) by mouth at bedtime. Patient not taking: No sig reported 08/26/14 01/01/21  Kathrynn Ducking, MD  clonazePAM (KLONOPIN) 0.5 MG tablet Take 0.5 mg by mouth 2 (two) times daily.  01/01/21  [provider]  temazepam (RESTORIL) 30 MG capsule Take 30 mg by mouth at bedtime.  01/01/21  [provider]    Allergies Penicillin g, Penicillin v, and Penicillins  Family History  Problem Relation Age of Onset  . Breast cancer Mother 48  . Lung cancer Father   . Cancer Brother   . Cancer Other   . Stroke Other     Social History Social History   Tobacco Use  . Smoking status: Former Smoker    Packs/day: 0.70    Years: 35.00    Pack years: 24.50    Types: Cigarettes  . Smokeless tobacco: Never Used  Vaping Use  . Vaping Use: Never used  Substance Use Topics  . Alcohol use: Yes     Comment: occasional wine  . Drug use: No    Review of Systems  Review of Systems  Constitutional: Positive for malaise/fatigue. Negative for chills and fever.  HENT: Negative for sore throat.   Eyes: Negative for pain.  Respiratory: Positive for cough ( chronic). Negative for stridor.   Cardiovascular: Negative for chest pain.  Gastrointestinal: Positive for nausea and vomiting.  Genitourinary: Negative for dysuria.  Musculoskeletal: Positive for back pain.  Skin: Negative for rash.  Neurological: Positive for headaches. Negative for seizures and loss of consciousness.  Psychiatric/Behavioral: Negative for suicidal ideas.  All other systems reviewed and are negative.     ____________________________________________   PHYSICAL EXAM:  VITAL SIGNS: ED Triage Vitals  Enc Vitals Group     BP --  Pulse --      Resp --      Temp --      Temp src --      SpO2 --      Weight 02/18/21 0850 120 lb (54.4 kg)     Height 02/18/21 0850 _0  (1.676 m)     Head Circumference --      Peak Flow --      Pain Score 02/18/21 0849 10     Pain Loc --      Pain Edu? --      Excl. in [place]? --    Vitals:   02/18/21 1300 02/18/21 1508  BP: (!) 181/90 (!) 164/92  Pulse: 91   Resp: 17   Temp: 98 F (36.7 C)   SpO2: 97%    Physical Exam Vitals and nursing note reviewed.  Constitutional:      General: She is not in acute distress.    Appearance: She is well-developed. She is ill-appearing.  HENT:     Head: Normocephalic and atraumatic.     Right Ear: External ear normal.     Left Ear: External ear normal.     Nose: Nose normal.     Mouth/Throat:     Mouth: Mucous membranes are dry.  Eyes:     Conjunctiva/sclera: Conjunctivae normal.  Cardiovascular:     Rate and Rhythm: Normal rate and regular rhythm.     Heart sounds: No murmur heard.   Pulmonary:     Effort: Pulmonary effort is normal. No respiratory distress.     Breath sounds: Normal breath sounds.  Abdominal:      Palpations: Abdomen is soft.     Tenderness: There is no abdominal tenderness.  Musculoskeletal:     Cervical back: Neck supple.  Skin:    General: Skin is warm and dry.     Capillary Refill: Capillary refill takes more than 3 seconds.  Neurological:     Mental Status: She is alert and oriented to person, place, and time.      There are some tenderness over the L-spine.  Patient has symmetric strength in her bilateral upper and lower extremities.  Sensation is intact to light touch of all extremities.  There is no tenderness over the C or T-spine.  Cranial nerves II through XII are grossly intact. ____________________________________________   LABS (all labs ordered are listed, but only abnormal results are displayed)  Labs Reviewed  CBC WITH DIFFERENTIAL/PLATELET - Abnormal; Notable for the following components:      Result Value   Hemoglobin 11.7 (*)    HCT 34.7 (*)    All other components within normal limits  COMPREHENSIVE METABOLIC PANEL - Abnormal; Notable for the following components:   Glucose, Bld 118 (*)    Albumin 3.2 (*)    AST 47 (*)    ALT 77 (*)    Alkaline Phosphatase 447 (*)    All other components within normal limits  URINALYSIS, COMPLETE (UACMP) WITH MICROSCOPIC - Abnormal; Notable for the following components:   Color, Urine YELLOW (*)    APPearance HAZY (*)    Hgb urine dipstick SMALL (*)    All other components within normal limits  RESP PANEL BY RT-PCR (FLU A&B, COVID) ARPGX2  HIV ANTIBODY (ROUTINE TESTING W REFLEX)   ____________________________________________  EKG  ____________________________________________  RADIOLOGY  ED MD interpretation: CT T and L-spine remarkable for known L2 pathologic compression fracture as well as evidence of T9 compression fracture.  No other clear acute process.  Official radiology report(s): CT Thoracic Spine Wo Contrast  Result Date: 02/18/2021 CLINICAL DATA:  Metastases on CT abdomen EXAM: CT THORACIC AND  LUMBAR SPINE WITHOUT CONTRAST TECHNIQUE: Multidetector CT imaging of the thoracic and lumbar spine was performed without contrast. Multiplanar CT image reconstructions were also generated. COMPARISON:  None. FINDINGS: CT THORACIC SPINE FINDINGS Alignment: Preserved. Vertebrae: There is a small partially sclerotic lesion at the right aspect of the T5 vertebral body. A lytic lesion is present at the anterior aspect T9 likely with pathologic fracture but no substantial height loss. Ill-defined lucent lesions are present elsewhere. Paraspinal and other soft tissues: Better evaluated on prior dedicated imaging. Disc levels: Multilevel degenerative changes. No high-grade degenerative canal narrowing. CT LUMBAR SPINE FINDINGS Segmentation: 5 lumbar type vertebrae. Alignment: Dextrocurvature.  Grade 1 anterolisthesis at L4-L5 Vertebrae: Lucency and sclerosis at L2 with pathologic compression fracture resulting in less than 50% loss of height at the superior endplate. Sclerotic lesion of the right sacrum. Paraspinal and other soft tissues: Better evaluated on prior dedicated imaging. Disc levels: Multilevel degenerative changes. Canal stenosis is greatest at L3-L4 and L4-L5. Foraminal narrowing is greatest on the right at L5-S1. IMPRESSION: Sclerotic and lytic metastatic lesions as already identified on the CT chest, abdomen, and pelvis. Pathologic L2 fracture with less than 50% loss of height. Probable pathologic fracture at T9 without height loss. No apparent significant epidural disease by CT. Electronically Signed   By: Macy Mis M.D.   On: 02/18/2021 10:53   CT Lumbar Spine Wo Contrast  Result Date: 02/18/2021 CLINICAL DATA:  Metastases on CT abdomen EXAM: CT THORACIC AND LUMBAR SPINE WITHOUT CONTRAST TECHNIQUE: Multidetector CT imaging of the thoracic and lumbar spine was performed without contrast. Multiplanar CT image reconstructions were also generated. COMPARISON:  None. FINDINGS: CT THORACIC SPINE  FINDINGS Alignment: Preserved. Vertebrae: There is a small partially sclerotic lesion at the right aspect of the T5 vertebral body. A lytic lesion is present at the anterior aspect T9 likely with pathologic fracture but no substantial height loss. Ill-defined lucent lesions are present elsewhere. Paraspinal and other soft tissues: Better evaluated on prior dedicated imaging. Disc levels: Multilevel degenerative changes. No high-grade degenerative canal narrowing. CT LUMBAR SPINE FINDINGS Segmentation: 5 lumbar type vertebrae. Alignment: Dextrocurvature.  Grade 1 anterolisthesis at L4-L5 Vertebrae: Lucency and sclerosis at L2 with pathologic compression fracture resulting in less than 50% loss of height at the superior endplate. Sclerotic lesion of the right sacrum. Paraspinal and other soft tissues: Better evaluated on prior dedicated imaging. Disc levels: Multilevel degenerative changes. Canal stenosis is greatest at L3-L4 and L4-L5. Foraminal narrowing is greatest on the right at L5-S1. IMPRESSION: Sclerotic and lytic metastatic lesions as already identified on the CT chest, abdomen, and pelvis. Pathologic L2 fracture with less than 50% loss of height. Probable pathologic fracture at T9 without height loss. No apparent significant epidural disease by CT. Electronically Signed   By: Macy Mis M.D.   On: 02/18/2021 10:53   US Renal  Result Date: 02/18/2021 CLINICAL DATA:  Back pain. Right renal pelvis fullness on right upper quadrant ultrasound performed 1 day prior EXAM: RENAL / URINARY TRACT ULTRASOUND COMPLETE COMPARISON:  02/17/2021 abdominal sonogram. FINDINGS: Right Kidney: Renal measurements: 10.3 x 4.2 x 4.2 cm = volume: 95 mL. Normal parenchymal echogenicity and thickness. Mild right hydronephrosis. Possible 5 mm right ureteropelvic junction stone. Nonobstructing 3 mm lower right renal stone. No renal masses. Left Kidney: Renal measurements: 10.0 x 4.8  x 4.2 cm = volume: 107 mL. Normal parenchymal  echogenicity and thickness. No left hydronephrosis. No renal masses. Probable nonobstructing 7 mm upper left renal stone. Bladder: Appears normal for degree of bladder distention. Bilateral ureteral jets seen in the bladder. Other: None. IMPRESSION: 1. Mild right hydronephrosis. Possible 5 mm right UPJ stone. Nonobstructing 3 mm lower right renal stone. Suggest unenhanced CT abdomen/pelvis for further evaluation. 2. Probable nonobstructing upper left renal stone. Electronically Signed   By: Ilona Sorrel M.D.   On: 02/18/2021 12:02    ____________________________________________   PROCEDURES  Procedure(s) performed (including Critical Care):  Procedures   ____________________________________________   INITIAL IMPRESSION / ASSESSMENT AND PLAN / ED COURSE     Patient presents with above to history exam for assessment of worsening low back pain and nausea in the setting of feeling like her mouth is too dry to swallow her Percocet and Phenergan she was recently prescribed.  On arrival she is hypertensive with otherwise stable vital signs on room air.  She denies any incontinence this is a nonfocal normal lower extremity neurological exam.  She does have some tenderness over her L-spine.  On review of recent imaging she was noted to have likely pathologic fracture at L2.  She denies any recent injuries or falls we will plan to obtain repeat imaging of this area this is where she is tender if she still new fractures.  Patient has no overt signs or symptoms of acute cord compression.  Labs reviewed yesterday included CBC CMP lipase and UA unremarkable for WBC count of 11.9 without anemia, AST of 84 and ALT of 106 as well as alk phos of 432, lipase of 62, and urine with some small hemoglobin but no evidence of infection.  Patient also had an ultrasound x-ray of her right upper quadrant was remarkable for "Three hypoechoic liver lesions measuring up to 1.8 cm correlate with metastatic deposit seen on  CT. Status post cholecystectomy. Mild intra and extrahepatic biliary ductal dilatation is likely related to cholecystectomy. No obstructing lesion is identified. Fullness of the right renal pelvis is new since yesterday's examination. This could be incidental. Correlation with dedicated renal ultrasound could be used for further evaluation if indicated."  While CT T and L-spine do show known L2 logical compression fracture patient also has a noted fracture at T9.  Given she seems unable to tolerate oral pain and nausea medicines and peers dehydrated on exam we will plan to admit to medicine for hydration pain and nausea control.    ____________________________________________   FINAL CLINICAL IMPRESSION(S) / ED DIAGNOSES  Final diagnoses:  Metastatic malignant neoplasm, unspecified site [facility])  Dehydration  Pathological compression fracture of lumbar vertebra with routine healing, subsequent encounter  Pathological compression fracture of thoracic vertebra, initial encounter (HCC)    Medications  lidocaine (LIDODERM) 5 % 1 patch (1 patch Transdermal Patch Applied 02/18/21 0928)  busPIRone (BUSPAR) tablet 10 mg (has no administration in time range)  hydrOXYzine (ATARAX/VISTARIL) tablet 25 mg (has no administration in time range)  QUEtiapine (SEROQUEL) tablet 400 mg (has no administration in time range)  pantoprazole (PROTONIX) EC tablet 40 mg (has no administration in time range)  gabapentin (NEURONTIN) capsule 300 mg (has no administration in time range)  albuterol (VENTOLIN HFA) 108 (90 Base) MCG/ACT inhaler 2 puff (has no administration in time range)  mometasone-formoterol (DULERA) 100-5 MCG/ACT inhaler 2 puff (has no administration in time range)  enoxaparin (LOVENOX) injection 40 mg (has no administration in time range)  0.9 %  sodium chloride infusion (has no administration in time range)  HYDROmorphone (DILAUDID) injection 1 mg (1 mg Intravenous Given 02/18/21 1508)  senna (SENOKOT)  tablet 8.6 mg (has no administration in time range)  amLODipine (NORVASC) tablet 5 mg (has no administration in time range)  dexamethasone (DECADRON) tablet 8 mg (has no administration in time range)  hydrALAZINE (APRESOLINE) injection 10 mg (10 mg Intravenous Given 02/18/21 1508)  fentaNYL (DURAGESIC) 25 MCG/HR 1 patch (has no administration in time range)  oxyCODONE-acetaminophen (PERCOCET/ROXICET) 5-325 MG per tablet 1-2 tablet (has no administration in time range)  ondansetron (ZOFRAN) tablet 4 mg (has no administration in time range)  ondansetron (ZOFRAN) injection 4 mg (4 mg Intravenous Given 02/18/21 0928)  lactated ringers bolus 1,000 mL (1,000 mLs Intravenous New Bag/Given 02/18/21 1003)  HYDROmorphone (DILAUDID) injection 0.5 mg (0.5 mg Intravenous Given 02/18/21 0928)  HYDROmorphone (DILAUDID) injection 0.5 mg (0.5 mg Intravenous Given 02/18/21 1111)     ED Discharge Orders    None       Note:  This document was prepared using Dragon voice recognition software and may include unintentional dictation errors.   Lucrezia Starch, MD 02/18/21 [id number]

## 2021-02-18 NOTE — H&P (Signed)
History and Physical    Brenda Middleton DVV:[id number] DOB: 1961-07-08 DOA: 02/18/2021  PCP: Remi Haggard, FNP   Patient coming from: Home  I have personally briefly reviewed patient's old medical records in [place]  Chief Complaint: Low back pain  HPI: Brenda Middleton is a 60 y.o. female with medical history significant for anxiety disorder, nicotine dependence, hypertension, depression and chronic low back pain who presents to the ER via EMS for evaluation of worsening pain in her lower back.  Patient rates her pain about an 8 x 10 in intensity at its worst.  It is nonradiating and she denies having any urinary/fecal incontinence, no lower extremity weakness or numbness. 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 Resulting mass effect on the left cerebellum and possible involvement of the subjacent dural venous sinuses. 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 Osteomyelitis is a differential consideration. Recommend MRI brain with and without contrast to further evaluate. Patient was referred to oncology as an outpatient for further evaluation and had an appointment scheduled for the day of admission but was unable to keep that appointment due to severe low back pain. She admits to significant unintentional weight loss as well as anorexia and occasional headache.  She also complains of nausea with emesis. She denies having any abdominal pain, no constipation, no diarrhea, no fever, no chills, no dizziness, no lightheadedness, no chest pain, no shortness of breath, no leg swelling, no orthopnea, no  palpitations, no diaphoresis, no nocturia, no dysuria, no frequency, no cough, no blurred vision or any focal deficits. Labs show sodium 137, potassium 4.3, chloride 101, bicarb 28, glucose 118, BUN 18, creatinine 0.82, calcium 8.9, alkaline phosphatase 447, albumin 3.2, AST 47, ALT 77, total protein 6.5, total bilirubin 0.9, white count 9.7, hemoglobin 11.7, hematocrit 34.7, MCV 85.7, RDW 15.0, platelet count 156 Respiratory viral panel is negative Renal ultrasound showed mild right hydronephrosis. Possible 5 mm right UPJ stone. Nonobstructing 3 mm lower right renal stone. Suggest unenhanced CT abdomen/pelvis for further evaluation. Probable nonobstructing upper left renal stone. CT scan of thoracic and lumbar spine shows sclerotic and lytic metastatic lesions as already identified on the CT chest, abdomen, and pelvis. Pathologic L2 fracture with less than 50% loss of height. Probable pathologic fracture at T9 without height loss. No apparent significant epidural disease by CT. Ultrasound of the gallbladder shows three hypoechoic liver lesions measuring up to 1.8 cm correlate with metastatic deposit seen on CT.Status post cholecystectomy. Mild intra and extrahepatic biliary ductal dilatation is likely related to cholecystectomy. No obstructing lesion is identified. CT scan of chest and abdomen done on 03/15 shows  Findings consistent with extensive metastatic carcinoma likely emanating from a 2.7 cm right middle lobe pulmonary nodule. Metastases include extensive lymphadenopathy in the chest,, pulmonary nodule bone lesions and liver lesions. Mild pathologic fracture of L2 with vertebral body height loss centrally of up to approximately 30% is again noted.   ED Course: Patient is a 60 year old Caucasian female who presents to the ER via EMS for evaluation of refractory lower back pain.  She has a  history of chronic low back pain but states that this pain is worse when compared to her known history.   Imaging shows pathologic L2 fracture with less than 50% loss of height as well as a probable pathologic fracture at T9.  She will be admitted to the hospital for further evaluation.  Review of Systems: As per HPI otherwise all other systems reviewed and negative.    Past Medical History:  Diagnosis Date  . Anxiety   . Benign neoplasm of cervix uteri   . Chronic hepatitis C without mention of hepatic coma   . Chronic low back pain 08/26/2014  . Constipation   . Depressive disorder   . Displacement of cervical intervertebral disc   . Hypertensive disorder   . Iron deficiency anemia due to chronic blood loss 09/12/2019  . Palpitations   . Prolapsed cervical intervertebral disc     Past Surgical History:  Procedure Laterality Date  . CONIZATION CERVIX    . DILATION AND CURETTAGE OF UTERUS    . HAND SURGERY  2008,2015   Carpel Tunnel  . TONSILLECTOMY    . VULVECTOMY       reports that she has quit smoking. Her smoking use included cigarettes. She has a 24.50 pack-year smoking history. She has never used smokeless tobacco. She reports current alcohol use. She reports that she does not use drugs.  Allergies  Allergen Reactions  . Penicillin G Hives    Other reaction(s): HIVES Other reaction(s): HIVES  . Penicillin V Itching  . Penicillins     Family History  Problem Relation Age of Onset  . Breast cancer Mother 33  . Lung cancer Father   . Cancer Brother   . Cancer Other   . Stroke Other       Prior to Admission medications   Medication Sig Start Date End Date Taking? Authorizing Provider  albuterol (PROVENTIL HFA;VENTOLIN HFA) 108 (90 BASE) MCG/ACT inhaler Inhale 2 puffs into the lungs every 4 (four) hours as needed for wheezing or shortness of breath.   Yes [provider]  budesonide-formoterol (SYMBICORT) 80-4.5 MCG/ACT inhaler Inhale 2 puffs into the lungs 2 (two) times daily.   Yes [provider]  busPIRone (BUSPAR) 10 MG tablet Take 10 mg by  mouth 2 (two) times daily. Patient is unsure of dose   Yes [provider]  furosemide (LASIX) 20 MG tablet Take 20 mg by mouth 2 (two) times daily. 01/28/21  Yes [provider]  gabapentin (NEURONTIN) 300 MG capsule Take 300 mg by mouth daily. 02/04/21  Yes [provider]  hydrOXYzine (ATARAX/VISTARIL) 25 MG tablet hydroxyzine HCl 25 mg tablet   Yes [provider]  ketorolac (TORADOL) 10 MG tablet Take 1 tablet (10 mg total) by mouth every 6 (six) hours as needed for moderate pain or severe pain. 02/13/21  Yes Cook, Jayce G, DO  Meloxicam 10 MG CAPS Take 1 capsule by mouth daily as needed.   Yes [provider]  ondansetron (ZOFRAN-ODT) 4 MG disintegrating tablet Take 1 tablet (4 mg total) by mouth every 8 (eight) hours as needed. 02/15/21  Yes Fisher, Linden Dolin, PA-C  oxyCODONE-acetaminophen (PERCOCET) 5-325 MG tablet Take 1 tablet by mouth every 4 (four) hours as needed for severe pain. 02/15/21 02/15/22 Yes Fisher, Linden Dolin, PA-C  pantoprazole (PROTONIX) 40 MG tablet Take 40 mg by mouth daily. 02/08/21  Yes [provider]  prochlorperazine (COMPAZINE) 10 MG tablet Take 10 mg by mouth every 6 (six)  hours as needed for nausea or vomiting.   Yes [provider]  promethazine (PHENERGAN) 12.5 MG tablet Take 1 tablet (12.5 mg total) by mouth every 6 (six) hours as needed for nausea or vomiting. 02/17/21  Yes Blake Divine, MD  QUEtiapine (SEROQUEL) 400 MG tablet Take 400 mg by mouth at bedtime.   Yes [provider]  doxycycline (VIBRAMYCIN) 100 MG capsule Take 1 capsule (100 mg total) by mouth 2 (two) times daily. Patient not taking: No sig reported 02/12/21   Coral Spikes, DO  amitriptyline (ELAVIL) 25 MG tablet Take 2 tablets (50 mg total) by mouth at bedtime. Patient not taking: No sig reported 08/26/14 01/01/21  Kathrynn Ducking, MD  clonazePAM (KLONOPIN) 0.5 MG tablet Take 0.5 mg by mouth 2 (two) times daily.  01/01/21  [provider]  temazepam (RESTORIL) 30 MG capsule Take 30 mg by mouth at bedtime.  01/01/21  [provider]    Physical Exam: Vitals:   02/18/21 0850 02/18/21 0900 02/18/21 1000  BP:  (!) 160/98 (!) 162/111  Pulse:  99 100  Resp:  18 16  Temp:  97.9 F (36.6 C)   SpO2:  97% 98%  Weight: 54.4 kg    Height: 5\' 6"  (1.676 m)       Vitals:   02/18/21 0850 02/18/21 0900 02/18/21 1000  BP:  (!) 160/98 (!) 162/111  Pulse:  99 100  Resp:  18 16  Temp:  97.9 F (36.6 C)   SpO2:  97% 98%  Weight: 54.4 kg    Height: 5\' 6"  (1.676 m)        Constitutional: Alert and oriented x 3 . Not in any apparent distress.  Chronically ill-appearing HEENT:      Head: Normocephalic and atraumatic.         Eyes: PERLA, EOMI, Conjunctivae pallor. Sclera is non-icteric.       Mouth/Throat: Mucous membranes are dry.       Neck: Supple with no signs of meningismus. Cardiovascular:  Tachycardia. No murmurs, gallops, or rubs. 2+ symmetrical distal pulses are present . No JVD. No LE edema Respiratory: Respiratory effort normal .Lungs sounds clear bilaterally. No wheezes, crackles, or rhonchi.  Gastrointestinal: Soft, non tender, and non distended with positive bowel sounds.  Genitourinary: No CVA tenderness. Musculoskeletal: Nontender with normal range of motion in all extremities. No cyanosis, or erythema of extremities. Neurologic:  Face is symmetric. Moving all extremities. No gross focal neurologic deficits.  Generalized weakness Skin: Skin is warm, dry.  Ecchymosis on forearms Psychiatric: Mood and affect are normal   Labs on Admission: I have personally reviewed following labs and imaging studies  CBC: Recent Labs  Lab 02/15/21 1042 02/17/21 0821 02/18/21 0934  WBC 12.1* 11.9* 9.7  NEUTROABS 8.8* 9.4* 7.6  HGB 11.3* 13.2 11.7*  HCT 34.2* 38.5 34.7*  MCV 87.2 85.2 85.7  PLT 129* 159 016   Basic Metabolic Panel: Recent Labs  Lab 02/15/21 1042 02/17/21 0821 02/18/21 0934   NA 142 137 137  K 4.0 3.7 4.3  CL 108 104 101  CO2 27 24 28   GLUCOSE 97 100* 118*  BUN 28* 21* 18  CREATININE 0.91 0.72 0.82  CALCIUM 8.9 8.5* 8.9   GFR: Estimated Creatinine Clearance: 62.7 mL/min (by C-G formula based on SCr of 0.82 mg/dL). Liver Function Tests: Recent Labs  Lab 02/15/21 1042 02/17/21 0821 02/18/21 0934  AST 32 84* 47*  ALT 35 106* 77*  ALKPHOS 224*  432* 447*  BILITOT 0.5 1.2 0.9  PROT 6.7 6.2* 6.5  ALBUMIN 3.3* 3.2* 3.2*   Recent Labs  Lab 02/17/21 0821  LIPASE 62*   No results for input(s): AMMONIA in the last 168 hours. Coagulation Profile: No results for input(s): INR, PROTIME in the last 168 hours. Cardiac Enzymes: No results for input(s): CKTOTAL, CKMB, CKMBINDEX, TROPONINI in the last 168 hours. BNP (last 3 results) No results for input(s): PROBNP in the last 8760 hours. HbA1C: No results for input(s): HGBA1C in the last 72 hours. CBG: No results for input(s): GLUCAP in the last 168 hours. Lipid Profile: No results for input(s): CHOL, HDL, LDLCALC, TRIG, CHOLHDL, LDLDIRECT in the last 72 hours. Thyroid Function Tests: No results for input(s): TSH, T4TOTAL, FREET4, T3FREE, THYROIDAB in the last 72 hours. Anemia Panel: No results for input(s): VITAMINB12, FOLATE, FERRITIN, TIBC, IRON, RETICCTPCT in the last 72 hours. Urine analysis:    Component Value Date/Time   COLORURINE YELLOW (A) 02/18/2021 0924   APPEARANCEUR HAZY (A) 02/18/2021 0924   APPEARANCEUR Clear 11/04/2013 1649   LABSPEC 1.015 02/18/2021 0924   LABSPEC 1.009 11/04/2013 1649   PHURINE 6.0 02/18/2021 0924   GLUCOSEU NEGATIVE 02/18/2021 0924   GLUCOSEU Negative 11/04/2013 1649   HGBUR SMALL (A) 02/18/2021 0924   BILIRUBINUR NEGATIVE 02/18/2021 0924   BILIRUBINUR Negative 11/04/2013 1649   KETONESUR NEGATIVE 02/18/2021 0924   PROTEINUR NEGATIVE 02/18/2021 0924   NITRITE NEGATIVE 02/18/2021 0924   LEUKOCYTESUR NEGATIVE 02/18/2021 0924   LEUKOCYTESUR Negative  11/04/2013 1649    Radiological Exams on Admission: CT Thoracic Spine Wo Contrast  Result Date: 02/18/2021 CLINICAL DATA:  Metastases on CT abdomen EXAM: CT THORACIC AND LUMBAR SPINE WITHOUT CONTRAST TECHNIQUE: Multidetector CT imaging of the thoracic and lumbar spine was performed without contrast. Multiplanar CT image reconstructions were also generated. COMPARISON:  None. FINDINGS: CT THORACIC SPINE FINDINGS Alignment: Preserved. Vertebrae: There is a small partially sclerotic lesion at the right aspect of the T5 vertebral body. A lytic lesion is present at the anterior aspect T9 likely with pathologic fracture but no substantial height loss. Ill-defined lucent lesions are present elsewhere. Paraspinal and other soft tissues: Better evaluated on prior dedicated imaging. Disc levels: Multilevel degenerative changes. No high-grade degenerative canal narrowing. CT LUMBAR SPINE FINDINGS Segmentation: 5 lumbar type vertebrae. Alignment: Dextrocurvature.  Grade 1 anterolisthesis at L4-L5 Vertebrae: Lucency and sclerosis at L2 with pathologic compression fracture resulting in less than 50% loss of height at the superior endplate. Sclerotic lesion of the right sacrum. Paraspinal and other soft tissues: Better evaluated on prior dedicated imaging. Disc levels: Multilevel degenerative changes. Canal stenosis is greatest at L3-L4 and L4-L5. Foraminal narrowing is greatest on the right at L5-S1. IMPRESSION: Sclerotic and lytic metastatic lesions as already identified on the CT chest, abdomen, and pelvis. Pathologic L2 fracture with less than 50% loss of height. Probable pathologic fracture at T9 without height loss. No apparent significant epidural disease by CT. Electronically Signed   By: Macy Mis M.D.   On: 02/18/2021 10:53   CT Lumbar Spine Wo Contrast  Result Date: 02/18/2021 CLINICAL DATA:  Metastases on CT abdomen EXAM: CT THORACIC AND LUMBAR SPINE WITHOUT CONTRAST TECHNIQUE: Multidetector CT imaging  of the thoracic and lumbar spine was performed without contrast. Multiplanar CT image reconstructions were also generated. COMPARISON:  None. FINDINGS: CT THORACIC SPINE FINDINGS Alignment: Preserved. Vertebrae: There is a small partially sclerotic lesion at the right aspect of the T5 vertebral body. A lytic lesion is  present at the anterior aspect T9 likely with pathologic fracture but no substantial height loss. Ill-defined lucent lesions are present elsewhere. Paraspinal and other soft tissues: Better evaluated on prior dedicated imaging. Disc levels: Multilevel degenerative changes. No high-grade degenerative canal narrowing. CT LUMBAR SPINE FINDINGS Segmentation: 5 lumbar type vertebrae. Alignment: Dextrocurvature.  Grade 1 anterolisthesis at L4-L5 Vertebrae: Lucency and sclerosis at L2 with pathologic compression fracture resulting in less than 50% loss of height at the superior endplate. Sclerotic lesion of the right sacrum. Paraspinal and other soft tissues: Better evaluated on prior dedicated imaging. Disc levels: Multilevel degenerative changes. Canal stenosis is greatest at L3-L4 and L4-L5. Foraminal narrowing is greatest on the right at L5-S1. IMPRESSION: Sclerotic and lytic metastatic lesions as already identified on the CT chest, abdomen, and pelvis. Pathologic L2 fracture with less than 50% loss of height. Probable pathologic fracture at T9 without height loss. No apparent significant epidural disease by CT. Electronically Signed   By: Macy Mis M.D.   On: 02/18/2021 10:53   US Renal  Result Date: 02/18/2021 CLINICAL DATA:  Back pain. Right renal pelvis fullness on right upper quadrant ultrasound performed 1 day prior EXAM: RENAL / URINARY TRACT ULTRASOUND COMPLETE COMPARISON:  02/17/2021 abdominal sonogram. FINDINGS: Right Kidney: Renal measurements: 10.3 x 4.2 x 4.2 cm = volume: 95 mL. Normal parenchymal echogenicity and thickness. Mild right hydronephrosis. Possible 5 mm right  ureteropelvic junction stone. Nonobstructing 3 mm lower right renal stone. No renal masses. Left Kidney: Renal measurements: 10.0 x 4.8 x 4.2 cm = volume: 107 mL. Normal parenchymal echogenicity and thickness. No left hydronephrosis. No renal masses. Probable nonobstructing 7 mm upper left renal stone. Bladder: Appears normal for degree of bladder distention. Bilateral ureteral jets seen in the bladder. Other: None. IMPRESSION: 1. Mild right hydronephrosis. Possible 5 mm right UPJ stone. Nonobstructing 3 mm lower right renal stone. Suggest unenhanced CT abdomen/pelvis for further evaluation. 2. Probable nonobstructing upper left renal stone. Electronically Signed   By: Ilona Sorrel M.D.   On: 02/18/2021 12:02   US Abdomen Limited RUQ (LIVER/GB)  Result Date: 02/17/2021 CLINICAL DATA:  Nausea and vomiting for 1 day. EXAM: ULTRASOUND ABDOMEN LIMITED RIGHT UPPER QUADRANT COMPARISON:  CT chest, abdomen and pelvis 02/15/2021. FINDINGS: Gallbladder: Removed. Common bile duct: Diameter: 0.8 cm. Liver: Three hypoechoic liver lesions measuring up to 1.8 cm are identified are identified and correlate with metastatic deposit seen on prior CT. Mild intrahepatic biliary ductal dilatation seen on CT is also noted. Portal vein is patent on color Doppler imaging with normal direction of blood flow towards the liver. Other: There is fullness of the right renal pelvis which is new since the patient's CT scan yesterday. IMPRESSION: Three hypoechoic liver lesions measuring up to 1.8 cm correlate with metastatic deposit seen on CT. Status post cholecystectomy. Mild intra and extrahepatic biliary ductal dilatation is likely related to cholecystectomy. No obstructing lesion is identified. Fullness of the right renal pelvis is new since yesterday's examination. This could be incidental. Correlation with dedicated renal ultrasound could be used for further evaluation if indicated. Electronically Signed   By: Inge Rise M.D.   On:  02/17/2021 12:53     Assessment/Plan Principal Problem:   Intractable low back pain Active Problems:   Anxiety   Depressive disorder   Pathologic fracture of lumbar vertebra, initial encounter   Mass of middle lobe of right lung   COPD (chronic obstructive pulmonary disease) (HCC)   GERD (gastroesophageal reflux disease)  Intractable low back pain Patient has a history of chronic low back pain which appears to have worsened over the last couple of days. She denies having any urinary/fecal incontinence, no lower extremity weakness or saddle anesthesia. Imaging shows a pathologic fracture involving L2 Pain control     Mass of middle lobe of right lung Patient noted to have a mass in the right middle lobe suspected to be a primary malignant lesion with diffuse metastatic disease which includes lymphadenopathy in the chest, bone and liver metastasis. We will request oncology consult    Hypertension We will start patient on low-dose amlodipine 5 mg daily    Anxiety/depression Continue buspirone, Vistaril and Seroquel    COPD Not acutely exacerbated Continue as needed bronchodilator therapy as well as inhaled steroids    GERD Continue Protonix     DVT prophylaxis: Lovenox Code Status: full code Family Communication: Greater than 50% of time was spent discussing patient's condition and plan of care with her and her sister at the bedside.  All questions and concerns have been addressed.  She lists her sister Darrion Macaulay as her healthcare power of attorney. Disposition Plan: Back to previous home environment Consults called: Oncology Status: At the time of admission, it appears that the appropriate admission status for this patient is inpatient. This is judged to be reasonable and necessary in order to provide the required intensity of service to ensure the patient's safety given the presenting symptoms, physical exam findings and initial radiographic and  laboratory data in the context of their comorbid conditions.    Collier Bullock MD [facility]     02/18/2021, 12:28 PM

## 2021-02-18 NOTE — ED Triage Notes (Addendum)
Pt to ED via [facility] with c/o lower back pain. Per EMS pt seen yesterday. Per EMS pt with hx of lung cancer at this time. Per EMS pt with hx of back pain at this time.    161/95 106Hr CBG 123   Pt c/o lower back pain, seen 2 weeks ago for same complaint, pt also c/o HA, states HA has been ongoing x 1 week.

## 2021-02-18 NOTE — Telephone Encounter (Signed)
Thank you! All providers in the lung clinic made aware. Will see inpatient if patient is admitted.

## 2021-02-18 NOTE — Telephone Encounter (Signed)
FYI for [facility] as well since this is a lung clinic appt.

## 2021-02-18 NOTE — ED Notes (Signed)
Pt presents to ED with c/o of mid back pain. 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 Pt states she feels no better than yesterday. Pt denies any new injury to back. Pt is A&Ox4. Pt also c/o nausea.

## 2021-02-19 DIAGNOSIS — M5459 Other low back pain: Secondary | ICD-10-CM | POA: Diagnosis not present

## 2021-02-19 LAB — MRSA PCR SCREENING: MRSA by PCR: NEGATIVE

## 2021-02-19 MED ORDER — HYDROCHLOROTHIAZIDE 25 MG PO TABS
25.0000 mg | ORAL_TABLET | Freq: Every day | ORAL | Status: DC
  Administered 2021-02-20: 25 mg via ORAL
  Filled 2021-02-19: qty 1

## 2021-02-19 MED ORDER — OXYCODONE HCL 5 MG PO TABS
7.5000 mg | ORAL_TABLET | ORAL | Status: DC | PRN
  Administered 2021-02-19 – 2021-02-21 (×8): 7.5 mg via ORAL
  Filled 2021-02-19 (×8): qty 2

## 2021-02-19 MED ORDER — SODIUM CHLORIDE 0.9 % IV SOLN: INTRAVENOUS | Status: DC

## 2021-02-19 MED ORDER — PROCHLORPERAZINE EDISYLATE 10 MG/2ML IJ SOLN
10.0000 mg | Freq: Four times a day (QID) | INTRAMUSCULAR | Status: DC | PRN
  Filled 2021-02-19 (×2): qty 2

## 2021-02-19 MED ORDER — ACETAMINOPHEN 500 MG PO TABS
1000.0000 mg | ORAL_TABLET | Freq: Three times a day (TID) | ORAL | Status: DC
  Administered 2021-02-19 – 2021-02-20 (×3): 1000 mg via ORAL
  Filled 2021-02-19 (×4): qty 2

## 2021-02-19 NOTE — Plan of Care (Signed)

## 2021-02-19 NOTE — Progress Notes (Addendum)
[facility] PROGRESS NOTE    Brenda Middleton  CHE:[id number] DOB: [birth year] DOA: 02/18/2021 PCP: Remi Haggard, FNP      Brief Narrative:  [name] is a 60 y.o. F with hx hep C, HTN, IDA, and substance use disorder in remission who presented with back pain.  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  She was discharged home but had intractable pain and so returned.       Assessment & Plan:  Cancer related pain No relief with fentanyl patch yet, nor with IV dilaudid. -Continue fentanyl patch -Increase oxycodone to 7.5 mg q4 -Continue steroids -Schedule acetaminophen -Continue gabapentin  Cancer unknown primary -Obtain US core biopsy on Monday  Anxiety -Continue BuSpar, quetiapine  GERD -Continue pantoprazole  COPD No evidence of flare -Continue Dulera    Leg swelling -Hold furosemide     Disposition: Status is: Inpatient  Remains inpatient appropriate because:Ongoing active pain requiring inpatient pain management   Dispo: The patient is from: Home              Anticipated d/c is to: Home              Patient currently is not medically stable to d/c.   Difficult to place patient No       Level of care: Med-Surg       MDM: The below labs and imaging reports were reviewed and summarized above.  Medication management as above.  Frequent doses of IV opiates.   DVT prophylaxis: enoxaparin (LOVENOX) injection 40 mg Start: 02/18/21 2200  Code Status: FULL Family Communication: sister at the bedside             Subjective: No fever, confusion.  She still has  headache, back pain.  This is not improved with her existing pain management.  No vomiting or seizures.  Objective: Vitals:   02/18/21 1533 02/18/21 2055 02/19/21 0836 02/19/21 1203  BP: (!) 163/84 (!) 173/95 (!) 175/89 (!) 155/88  Pulse: 98 97 (!) 103 (!) 101  Resp: 18 17 16 16   Temp:   97.6 F (36.4 C) 98.2 F (36.8 C)  TempSrc:      SpO2: 96% 93% 95% 95%  Weight:      Height:       No intake or output data in the 24 hours ending 02/19/21 1508 Filed Weights   02/18/21 0850  Weight: 54.4 kg    Examination: General appearance:  adult female, alert and in moderate distress from pain HEENT: Anicteric, conjunctiva pink, lids and lashes normal. No nasal deformity, discharge, epistaxis.  Lips moist.   Skin: Warm and dry.  no jaundice.  No suspicious rashes or lesions. Cardiac: RRR, nl S1-S2, no murmurs appreciated.  Capillary refill is brisk  JVP normal.  No LE edema.  Radial pulses 2+ and symmetric. Respiratory: Normal respiratory rate and rhythm.  CTAB without rales or wheezes. Abdomen: Abdomen soft.  no TTP. No ascites, distension, hepatosplenomegaly.   MSK: No deformities or effusions. Neuro: Awake and alert.  EOMI, moves all extremities. Speech fluent.    Psych: Sensorium intact and responding to questions, attention normal. Affect flat.  Judgment and insight  appear normal.    Data Reviewed: I have personally reviewed following labs and imaging studies:  CBC: Recent Labs  Lab 02/15/21 1042 02/17/21 0821 02/18/21 0934  WBC 12.1* 11.9* 9.7  NEUTROABS 8.8* 9.4* 7.6  HGB 11.3* 13.2 11.7*  HCT 34.2* 38.5 34.7*  MCV 87.2 85.2 85.7  PLT 129* 159 981   Basic Metabolic Panel: Recent Labs  Lab 02/15/21 1042 02/17/21 0821 02/18/21 0934  NA 142 137 137  K 4.0 3.7 4.3  CL 108 104 101  CO2 27 24 28   GLUCOSE 97 100* 118*  BUN 28* 21* 18  CREATININE 0.91 0.72 0.82  CALCIUM 8.9 8.5* 8.9   GFR: Estimated Creatinine Clearance: 62.7 mL/min (by C-G formula based on SCr of  0.82 mg/dL). Liver Function Tests: Recent Labs  Lab 02/15/21 1042 02/17/21 0821 02/18/21 0934  AST 32 84* 47*  ALT 35 106* 77*  ALKPHOS 224* 432* 447*  BILITOT 0.5 1.2 0.9  PROT 6.7 6.2* 6.5  ALBUMIN 3.3* 3.2* 3.2*   Recent Labs  Lab 02/17/21 0821  LIPASE 62*   No results for input(s): AMMONIA in the last 168 hours. Coagulation Profile: No results for input(s): INR, PROTIME in the last 168 hours. Cardiac Enzymes: No results for input(s): CKTOTAL, CKMB, CKMBINDEX, TROPONINI in the last 168 hours. BNP (last 3 results) No results for input(s): PROBNP in the last 8760 hours. HbA1C: No results for input(s): HGBA1C in the last 72 hours. CBG: No results for input(s): GLUCAP in the last 168 hours. Lipid Profile: No results for input(s): CHOL, HDL, LDLCALC, TRIG, CHOLHDL, LDLDIRECT in the last 72 hours. Thyroid Function Tests: No results for input(s): TSH, T4TOTAL, FREET4, T3FREE, THYROIDAB in the last 72 hours. Anemia Panel: No results for input(s): VITAMINB12, FOLATE, FERRITIN, TIBC, IRON, RETICCTPCT in the last 72 hours. Urine analysis:    Component Value Date/Time   COLORURINE YELLOW (A) 02/18/2021 0924   APPEARANCEUR HAZY (A) 02/18/2021 0924   APPEARANCEUR Clear 11/04/2013 1649   LABSPEC 1.015 02/18/2021 0924   LABSPEC 1.009 11/04/2013 1649   PHURINE 6.0 02/18/2021 0924   GLUCOSEU NEGATIVE 02/18/2021 0924   GLUCOSEU Negative 11/04/2013 1649   HGBUR SMALL (A) 02/18/2021 0924   BILIRUBINUR NEGATIVE 02/18/2021 0924   BILIRUBINUR Negative 11/04/2013 1649   KETONESUR NEGATIVE 02/18/2021 0924   PROTEINUR NEGATIVE 02/18/2021 0924   NITRITE NEGATIVE 02/18/2021 0924   LEUKOCYTESUR NEGATIVE 02/18/2021 0924   LEUKOCYTESUR Negative 11/04/2013 1649   Sepsis Labs: @LABRCNTIP (procalcitonin:4,lacticacidven:4)  ) Recent Results (from the past 240 hour(s))  Resp Panel by RT-PCR (Flu A&B, Covid) Nasopharyngeal Swab     Status: None   Collection Time: 02/18/21  9:24 AM    Specimen: Nasopharyngeal Swab; Nasopharyngeal(NP) swabs in vial transport medium  Result Value Ref Range Status   SARS Coronavirus 2 by RT PCR NEGATIVE NEGATIVE Final    Comment: (NOTE) SARS-CoV-2 target nucleic acids are NOT DETECTED.  The SARS-CoV-2 RNA is generally detectable in upper respiratory specimens during the acute phase of infection. The lowest concentration of SARS-CoV-2 viral copies this assay can detect is 138 copies/mL. A negative result does not preclude SARS-Cov-2 infection and should not be used as the sole basis for treatment or other patient management decisions. A negative result may occur with  improper specimen collection/handling, submission of specimen other than nasopharyngeal swab, presence of viral mutation(s) within the areas targeted by this assay, and inadequate number of viral copies(<138 copies/mL). A negative result must be combined with clinical observations, patient history, and  epidemiological information. The expected result is Negative.  Fact Sheet for Patients:  EntrepreneurPulse.com.au  Fact Sheet for Healthcare Providers:  IncredibleEmployment.be  This test is no t yet approved or cleared by the [place] FDA and  has been authorized for detection and/or diagnosis of SARS-CoV-2 by FDA under an Emergency Use Authorization (EUA). This EUA will remain  in effect (meaning this test can be used) for the duration of the COVID-19 declaration under Section 564(b)(1) of the Act, 21 U.S.C.section 360bbb-3(b)(1), unless the authorization is terminated  or revoked sooner.       Influenza A by PCR NEGATIVE NEGATIVE Final   Influenza B by PCR NEGATIVE NEGATIVE Final    Comment: (NOTE) The Xpert Xpress SARS-CoV-2/FLU/RSV plus assay is intended as an aid in the diagnosis of influenza from Nasopharyngeal swab specimens and should not be used as a sole basis for treatment. Nasal washings and aspirates are  unacceptable for Xpert Xpress SARS-CoV-2/FLU/RSV testing.  Fact Sheet for Patients: EntrepreneurPulse.com.au  Fact Sheet for Healthcare Providers: IncredibleEmployment.be  This test is not yet approved or cleared by the [place] FDA and has been authorized for detection and/or diagnosis of SARS-CoV-2 by FDA under an Emergency Use Authorization (EUA). This EUA will remain in effect (meaning this test can be used) for the duration of the COVID-19 declaration under Section 564(b)(1) of the Act, 21 U.S.C. section 360bbb-3(b)(1), unless the authorization is terminated or revoked.  Performed at [facility], [address] [place], [place] [address]          Radiology Studies: CT Thoracic Spine Wo Contrast  Result Date: 02/18/2021 CLINICAL DATA:  Metastases on CT abdomen EXAM: CT THORACIC AND LUMBAR SPINE WITHOUT CONTRAST TECHNIQUE: Multidetector CT imaging of the thoracic and lumbar spine was performed without contrast. Multiplanar CT image reconstructions were also generated. COMPARISON:  None. FINDINGS: CT THORACIC SPINE FINDINGS Alignment: Preserved. Vertebrae: There is a small partially sclerotic lesion at the right aspect of the T5 vertebral body. A lytic lesion is present at the anterior aspect T9 likely with pathologic fracture but no substantial height loss. Ill-defined lucent lesions are present elsewhere. Paraspinal and other soft tissues: Better evaluated on prior dedicated imaging. Disc levels: Multilevel degenerative changes. No high-grade degenerative canal narrowing. CT LUMBAR SPINE FINDINGS Segmentation: 5 lumbar type vertebrae. Alignment: Dextrocurvature.  Grade 1 anterolisthesis at L4-L5 Vertebrae: Lucency and sclerosis at L2 with pathologic compression fracture resulting in less than 50% loss of height at the superior endplate. Sclerotic lesion of the right sacrum. Paraspinal and other soft tissues: Better evaluated on  prior dedicated imaging. Disc levels: Multilevel degenerative changes. Canal stenosis is greatest at L3-L4 and L4-L5. Foraminal narrowing is greatest on the right at L5-S1. IMPRESSION: Sclerotic and lytic metastatic lesions as already identified on the CT chest, abdomen, and pelvis. Pathologic L2 fracture with less than 50% loss of height. Probable pathologic fracture at T9 without height loss. No apparent significant epidural disease by CT. Electronically Signed   By: Macy Mis M.D.   On: 02/18/2021 10:53   CT Lumbar Spine Wo Contrast  Result Date: 02/18/2021 CLINICAL DATA:  Metastases on CT abdomen EXAM: CT THORACIC AND LUMBAR SPINE WITHOUT CONTRAST TECHNIQUE: Multidetector CT imaging of the thoracic and lumbar spine was performed without contrast. Multiplanar CT image reconstructions were also generated. COMPARISON:  None. FINDINGS: CT THORACIC SPINE FINDINGS Alignment: Preserved. Vertebrae: There is a small partially sclerotic lesion at the right aspect of the T5 vertebral body. A lytic lesion is present  at the anterior aspect T9 likely with pathologic fracture but no substantial height loss. Ill-defined lucent lesions are present elsewhere. Paraspinal and other soft tissues: Better evaluated on prior dedicated imaging. Disc levels: Multilevel degenerative changes. No high-grade degenerative canal narrowing. CT LUMBAR SPINE FINDINGS Segmentation: 5 lumbar type vertebrae. Alignment: Dextrocurvature.  Grade 1 anterolisthesis at L4-L5 Vertebrae: Lucency and sclerosis at L2 with pathologic compression fracture resulting in less than 50% loss of height at the superior endplate. Sclerotic lesion of the right sacrum. Paraspinal and other soft tissues: Better evaluated on prior dedicated imaging. Disc levels: Multilevel degenerative changes. Canal stenosis is greatest at L3-L4 and L4-L5. Foraminal narrowing is greatest on the right at L5-S1. IMPRESSION: Sclerotic and lytic metastatic lesions as already  identified on the CT chest, abdomen, and pelvis. Pathologic L2 fracture with less than 50% loss of height. Probable pathologic fracture at T9 without height loss. No apparent significant epidural disease by CT. Electronically Signed   By: Macy Mis M.D.   On: 02/18/2021 10:53   US Renal  Result Date: 02/18/2021 CLINICAL DATA:  Back pain. Right renal pelvis fullness on right upper quadrant ultrasound performed 1 day prior EXAM: RENAL / URINARY TRACT ULTRASOUND COMPLETE COMPARISON:  02/17/2021 abdominal sonogram. FINDINGS: Right Kidney: Renal measurements: 10.3 x 4.2 x 4.2 cm = volume: 95 mL. Normal parenchymal echogenicity and thickness. Mild right hydronephrosis. Possible 5 mm right ureteropelvic junction stone. Nonobstructing 3 mm lower right renal stone. No renal masses. Left Kidney: Renal measurements: 10.0 x 4.8 x 4.2 cm = volume: 107 mL. Normal parenchymal echogenicity and thickness. No left hydronephrosis. No renal masses. Probable nonobstructing 7 mm upper left renal stone. Bladder: Appears normal for degree of bladder distention. Bilateral ureteral jets seen in the bladder. Other: None. IMPRESSION: 1. Mild right hydronephrosis. Possible 5 mm right UPJ stone. Nonobstructing 3 mm lower right renal stone. Suggest unenhanced CT abdomen/pelvis for further evaluation. 2. Probable nonobstructing upper left renal stone. Electronically Signed   By: Ilona Sorrel M.D.   On: 02/18/2021 12:02        Scheduled Meds: . acetaminophen  1,000 mg Oral TID  . amLODipine  5 mg Oral Daily  . busPIRone  10 mg Oral BID  . dexamethasone  8 mg Oral Q12H  . enoxaparin (LOVENOX) injection  40 mg Subcutaneous Q24H  . fentaNYL  1 patch Transdermal Q72H  . gabapentin  300 mg Oral Daily  . lidocaine  1 patch Transdermal Q24H  . mometasone-formoterol  2 puff Inhalation BID  . ondansetron  4 mg Oral Q8H  . pantoprazole  40 mg Oral Daily  . QUEtiapine  400 mg Oral QHS  . senna  1 tablet Oral BID   Continuous  Infusions: . promethazine (PHENERGAN) injection 12.5 mg (02/19/21 0811)     LOS: 1 day    Time spent: 25 minutes    Edwin Dada, MD [facility] 02/19/2021, 3:08 PM     Please page though [place] or Epic secure chat:  For [employer], Adult nurse

## 2021-02-20 DIAGNOSIS — M5459 Other low back pain: Secondary | ICD-10-CM | POA: Diagnosis not present

## 2021-02-20 MED ORDER — LABETALOL HCL 200 MG PO TABS
200.0000 mg | ORAL_TABLET | Freq: Three times a day (TID) | ORAL | Status: DC | PRN
  Administered 2021-02-20: 200 mg via ORAL
  Filled 2021-02-20 (×3): qty 1

## 2021-02-20 MED ORDER — ENSURE ENLIVE PO LIQD
237.0000 mL | Freq: Three times a day (TID) | ORAL | Status: DC
  Administered 2021-02-20 (×2): 237 mL via ORAL

## 2021-02-20 NOTE — Progress Notes (Addendum)
[facility] PROGRESS NOTE    Shron Ozer Edge  DGL:[id number] DOB: Feb 19, 1961 DOA: 02/18/2021 PCP: Remi Haggard, FNP      Brief Narrative:  Brenda Middleton is a 60 y.o. F with hx hep C, HTN, IDA, and substance use disorder in remission who presented with back pain.  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  She was discharged home but had intractable pain and so returned.       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 -Have reached out to Oncology for guidance -SLP eval   Cancer unknown primary -Obtain US core biopsy on Monday  Anxiety -Continue BuSpar, quetiapine  GERD -Continue pantoprazole  COPD No evidence of flare -Continue Dulera    Leg swelling -Hold furosemide  Hypertension BP better overnight -Continue new amlodipine -Start HCTZ -Continue hydralazine  Transaminitis Due to metastasis likely. -Trend LFTs      Disposition: Status is: Inpatient  Remains inpatient appropriate because:Ongoing diagnostic testing needed not appropriate for outpatient work up   Dispo: The patient is from: Home              Anticipated d/c is to: Home              Patient currently is not medically stable to d/c.   Difficult to place patient No       Level of care: Med-Surg       MDM: The below labs and imaging reports were  reviewed and summarized above.  Medication management as above.     DVT prophylaxis: enoxaparin (LOVENOX) injection 40 mg Start: 02/18/21 2200  Code Status: FULL Family Communication: sister at the bedside             Subjective: No fever, confusion.  She still has headache, back pain, but this is better.  No vomiting or seizures.  Objective: Vitals:   02/20/21 0527 02/20/21 0820 02/20/21 1208 02/20/21 1633  BP: (!) 160/88 (!) 162/93 (!) 154/94 (!) 172/91  Pulse: (!) 108 (!) 103 90 100  Resp: 14 18 18 16   Temp: 98.6 F (37 C) 97.8 F (36.6 C) 98.3 F (36.8 C) 98.5 F (36.9 C)  TempSrc: Oral Oral    SpO2: 97% 97% 95% 96%  Weight:   57.7 kg   Height:        Intake/Output Summary (Last 24 hours) at 02/20/2021 1640 Last data filed at 02/20/2021 1500 Gross per 24 hour  Intake 1126.52 ml  Output --  Net 1126.52 ml   Filed Weights   02/18/21 0850 02/20/21 1208  Weight: 54.4 kg 57.7 kg    Examination: General appearance:  adult female, alert and in moderate distress from pain HEENT: Anicteric, conjunctiva pink, lids and lashes normal. No nasal deformity, discharge, epistaxis.  Lips moist.   Skin: Warm and dry.  no jaundice.  No suspicious rashes or lesions. Cardiac: RRR, nl S1-S2, no murmurs appreciated.  Capillary refill is brisk  JVP normal.  No LE edema.  Radial pulses 2+ and symmetric. Respiratory: Normal respiratory rate and rhythm.  CTAB without rales or wheezes. Abdomen: Abdomen soft.  no TTP. No ascites, distension, hepatosplenomegaly.   MSK: No deformities or effusions. Neuro: Awake and alert.  EOMI, moves all extremities. Speech fluent.    Psych: Sensorium intact and responding to questions, attention normal. Affect flat.  Judgment and insight appear normal.    Data Reviewed: I have personally reviewed following labs and imaging studies:  CBC: Recent Labs  Lab 02/15/21 1042 02/17/21 0821 02/18/21 0934  WBC 12.1* 11.9* 9.7  NEUTROABS 8.8* 9.4*  7.6  HGB 11.3* 13.2 11.7*  HCT 34.2* 38.5 34.7*  MCV 87.2 85.2 85.7  PLT 129* 159 592   Basic Metabolic Panel: Recent Labs  Lab 02/15/21 1042 02/17/21 0821 02/18/21 0934  NA 142 137 137  K 4.0 3.7 4.3  CL 108 104 101  CO2 27 24 28   GLUCOSE 97 100* 118*  BUN 28* 21* 18  CREATININE 0.91 0.72 0.82  CALCIUM 8.9 8.5* 8.9   GFR: Estimated Creatinine Clearance: 66.5 mL/min (by C-G formula based on SCr of 0.82 mg/dL). Liver Function Tests: Recent Labs  Lab 02/15/21 1042 02/17/21 0821 02/18/21 0934  AST 32 84* 47*  ALT 35 106* 77*  ALKPHOS 224* 432* 447*  BILITOT 0.5 1.2 0.9  PROT 6.7 6.2* 6.5  ALBUMIN 3.3* 3.2* 3.2*   Recent Labs  Lab 02/17/21 0821  LIPASE 62*   No results for input(s): AMMONIA in the last 168 hours. Coagulation Profile: No results for input(s): INR, PROTIME in the last 168 hours. Cardiac Enzymes: No results for input(s): CKTOTAL, CKMB, CKMBINDEX, TROPONINI in the last 168 hours. BNP (last 3 results) No results for input(s): PROBNP in the last 8760 hours. HbA1C: No results for input(s): HGBA1C in the last 72 hours. CBG: No results for input(s): GLUCAP in the last 168 hours. Lipid Profile: No results for input(s): CHOL, HDL, LDLCALC, TRIG, CHOLHDL, LDLDIRECT in the last 72 hours. Thyroid Function Tests: No results for input(s): TSH, T4TOTAL, FREET4, T3FREE, THYROIDAB in the last 72 hours. Anemia Panel: No results for input(s): VITAMINB12, FOLATE, FERRITIN, TIBC, IRON, RETICCTPCT in the last 72 hours. Urine analysis:    Component Value Date/Time   COLORURINE YELLOW (A) 02/18/2021 0924   APPEARANCEUR HAZY (A) 02/18/2021 0924   APPEARANCEUR Clear 11/04/2013 1649   LABSPEC 1.015 02/18/2021 0924   LABSPEC 1.009 11/04/2013 1649   PHURINE 6.0 02/18/2021 0924   GLUCOSEU NEGATIVE 02/18/2021 0924   GLUCOSEU Negative 11/04/2013 1649   HGBUR SMALL (A) 02/18/2021 0924   BILIRUBINUR NEGATIVE 02/18/2021 0924   BILIRUBINUR Negative 11/04/2013 1649    KETONESUR NEGATIVE 02/18/2021 0924   PROTEINUR NEGATIVE 02/18/2021 0924   NITRITE NEGATIVE 02/18/2021 0924   LEUKOCYTESUR NEGATIVE 02/18/2021 0924   LEUKOCYTESUR Negative 11/04/2013 1649   Sepsis Labs: @LABRCNTIP (procalcitonin:4,lacticacidven:4)  ) Recent Results (from the past 240 hour(s))  Resp Panel by RT-PCR (Flu A&B, Covid) Nasopharyngeal Swab     Status: None   Collection Time: 02/18/21  9:24 AM   Specimen: Nasopharyngeal Swab; Nasopharyngeal(NP) swabs in vial transport medium  Result Value Ref Range Status   SARS Coronavirus 2 by RT PCR NEGATIVE NEGATIVE Final    Comment: (NOTE) SARS-CoV-2 target nucleic acids are NOT DETECTED.  The SARS-CoV-2 RNA is generally detectable in upper respiratory specimens during the acute phase of infection. The lowest concentration of SARS-CoV-2 viral copies this assay can detect is 138 copies/mL. A negative result does  not preclude SARS-Cov-2 infection and should not be used as the sole basis for treatment or other patient management decisions. A negative result may occur with  improper specimen collection/handling, submission of specimen other than nasopharyngeal swab, presence of viral mutation(s) within the areas targeted by this assay, and inadequate number of viral copies(<138 copies/mL). A negative result must be combined with clinical observations, patient history, and epidemiological information. The expected result is Negative.  Fact Sheet for Patients:  EntrepreneurPulse.com.au  Fact Sheet for Healthcare Providers:  IncredibleEmployment.be  This test is no t yet approved or cleared by the [place] FDA and  has been authorized for detection and/or diagnosis of SARS-CoV-2 by FDA under an Emergency Use Authorization (EUA). This EUA will remain  in effect (meaning this test can be used) for the duration of the COVID-19 declaration under Section 564(b)(1) of the Act, 21 U.S.C.section  360bbb-3(b)(1), unless the authorization is terminated  or revoked sooner.       Influenza A by PCR NEGATIVE NEGATIVE Final   Influenza B by PCR NEGATIVE NEGATIVE Final    Comment: (NOTE) The Xpert Xpress SARS-CoV-2/FLU/RSV plus assay is intended as an aid in the diagnosis of influenza from Nasopharyngeal swab specimens and should not be used as a sole basis for treatment. Nasal washings and aspirates are unacceptable for Xpert Xpress SARS-CoV-2/FLU/RSV testing.  Fact Sheet for Patients: EntrepreneurPulse.com.au  Fact Sheet for Healthcare Providers: IncredibleEmployment.be  This test is not yet approved or cleared by the [place] FDA and has been authorized for detection and/or diagnosis of SARS-CoV-2 by FDA under an Emergency Use Authorization (EUA). This EUA will remain in effect (meaning this test can be used) for the duration of the COVID-19 declaration under Section 564(b)(1) of the Act, 21 U.S.C. section 360bbb-3(b)(1), unless the authorization is terminated or revoked.  Performed at [facility], [address] [place], [place] [address]   MRSA PCR Screening     Status: None   Collection Time: 02/19/21  4:22 PM   Specimen: Nasopharyngeal  Result Value Ref Range Status   MRSA by PCR NEGATIVE NEGATIVE Final    Comment:        The GeneXpert MRSA Assay (FDA approved for NASAL specimens only), is one component of a comprehensive MRSA colonization surveillance program. It is not intended to diagnose MRSA infection nor to guide or monitor treatment for MRSA infections. Performed at [facility], [address] [place], [place] [address]          Radiology Studies: No results found.      Scheduled Meds:  acetaminophen  1,000 mg Oral TID   amLODipine  5 mg Oral Daily   busPIRone  10 mg Oral BID   dexamethasone  8 mg Oral Q12H   enoxaparin (LOVENOX) injection  40 mg Subcutaneous Q24H    feeding supplement  237 mL Oral TID BM   fentaNYL  1 patch Transdermal Q72H   gabapentin  300 mg Oral Daily   hydrochlorothiazide  25 mg Oral Daily   lidocaine  1 patch Transdermal Q24H   mometasone-formoterol  2 puff Inhalation BID   ondansetron  4 mg Oral Q8H   pantoprazole  40 mg Oral Daily   QUEtiapine  400 mg Oral QHS   senna  1 tablet Oral BID   Continuous Infusions:  sodium chloride 50 mL/hr at 02/19/21 1749   promethazine (PHENERGAN) injection 12.5 mg (02/19/21 1652)     LOS: 2 days    Time spent:  25 minutes    Edwin Dada, MD [facility] 02/20/2021, 4:40 PM     Please page though [place] or Epic secure chat:  For [employer], Adult nurse

## 2021-02-20 NOTE — Progress Notes (Signed)
   02/19/21 2358  Assess: MEWS Score  Temp 98.5 F (36.9 C)  BP 128/84  Pulse Rate (!) 112  Resp 18  SpO2 93 %  O2 Device Room Air  Assess: MEWS Score  MEWS Temp 0  MEWS Systolic 0  MEWS Pulse 2  MEWS RR 0  MEWS LOC 0  MEWS Score 2  MEWS Score Color Yellow  Assess: if the MEWS score is Yellow or Red  Were vital signs taken at a resting state? No  Focused Assessment No change from prior assessment  Early Detection of Sepsis Score *See Row Information* Low  MEWS guidelines implemented *See Row Information* Yes  Treat  MEWS Interventions Administered scheduled meds/treatments  Pain Scale 0-10  Pain Score 3  Pain Type Acute pain  Pain Location Head  Pain Intervention(s) Emotional support  Patients response to intervention Effective  Take Vital Signs  Increase Vital Sign Frequency  Yellow: Q 2hr X 2 then Q 4hr X 2, if remains yellow, continue Q 4hrs  Notify: Charge Nurse/RN  Name of Charge Nurse/RN Notified Linnise RN  Date Charge Nurse/RN Notified 02/19/21  Time Charge Nurse/RN Notified 2358  Document  Progress note created (see row info) Yes  Pt gets agitated when moved. Will continue to monitor.

## 2021-02-20 NOTE — Progress Notes (Signed)
Initial Nutrition Assessment  DOCUMENTATION CODES:   Not applicable (suspect malnutrition)  INTERVENTION:   - Please obtain measured admission weight  - Liberalize diet to Regular, verbal with readback order placed per Dr. Loleta Books  - Ensure Enlive po TID, each supplement provides 350 kcal and 20 grams of protein  - Magic Cup TID with meals, each supplement provides 290 kcal and 9 grams of protein  NUTRITION DIAGNOSIS:   Inadequate oral intake related to nausea,vomiting,decreased appetite as evidenced by meal completion < 50%.  GOAL:   Patient will meet greater than or equal to 90% of their needs  MONITOR:   PO intake,Supplement acceptance,Weight trends  REASON FOR ASSESSMENT:   Malnutrition Screening Tool    ASSESSMENT:   60 year old female who presented to the ED on 3/18 with back pain. PMH of anxiety disorder, nicotine dependence, HTN, depression, substance abuse disorder in remission. Pt recently found to have mass of middle lobe of right lung suspect to be a primary malignant lesion with diffuse metastatic disease which includes lymphadenopathy in the chest with bone and liver metastasis.   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  Pt currently on a 2 gram sodium diet with one meal completion of 25% documented. Noted pt with an episode of emesis (undigested food) yesterday. Discussed diet liberalization with MD who agreed. Verbal with readback order placed for Regular diet.  Spoke with pt briefly via phone call to room. Pt requested that phone call be kept brief  due to pain. Pt states that she has been trying her best to eat. She was trying to drink Ensure at home and is willing to consume Ensure supplements while admitted. She prefers chocolate flavor. Pt states that she likes applesauce. RD will order applesauce to come with all meal trays. Pt reports no N/V yet today. She endorses weight loss but unable to quantify amount or timeframe. RD will attempt to obtain further history at follow-up when pt's pain is better controlled.  Reviewed weight history in chart. Current weight of 120 lbs (54.4 kg) appears to be carried over from multiple previous encounters this month. If accurate, pt has experienced a 2.8 kg weight loss since 01/01/21. This is a 5% weight loss in just under 2 months which is not significant for timeframe. However, RD suspects pt's current weight is lower than documented weight of 54.4 kg. The last weight available prior to 01/01/21 is from October 2020. RD has ordered daily weights to better assess weight trends.   Strongly suspect some degree of malnutrition is present but unable to confirm without NFPE.  Medications reviewed and include: decadron, zofran 4 mg q 8 hours, protonix, senna, PRN phenergan IVF: NS @ 50 ml/hr  Labs reviewed: elevated LFTs  NUTRITION - FOCUSED PHYSICAL EXAM:  Unable to complete at this time. RD working remotely.  Diet Order:   Diet Order            Diet regular Room service appropriate? Yes; Fluid consistency: Thin  Diet effective now                 EDUCATION NEEDS:   Not  appropriate for education at this time (RD will follow up for education when pt's pain is better controlled)  Skin:  Skin Assessment: Reviewed RN Assessment  Last BM:  02/18/21  Height:   Ht Readings from Last 1 Encounters:  02/18/21 5\' 6"  (1.676 m)    Weight:   Wt Readings from Last 1 Encounters:  02/18/21 54.4 kg    BMI:  Body mass index is 19.37 kg/m.  Estimated Nutritional Needs:   Kcal:   1650-1850  Protein:  80-90 grams  Fluid:  1.6-1.8 L    Gustavus Bryant, MS, RD, LDN Inpatient Clinical Dietitian Please see [facility] for contact information.

## 2021-02-21 ENCOUNTER — Inpatient Hospital Stay: Payer: Medicaid Other

## 2021-02-21 DIAGNOSIS — C799 Secondary malignant neoplasm of unspecified site: Secondary | ICD-10-CM | POA: Diagnosis not present

## 2021-02-21 DIAGNOSIS — M5459 Other low back pain: Secondary | ICD-10-CM | POA: Diagnosis not present

## 2021-02-21 DIAGNOSIS — R918 Other nonspecific abnormal finding of lung field: Secondary | ICD-10-CM | POA: Diagnosis not present

## 2021-02-21 DIAGNOSIS — G893 Neoplasm related pain (acute) (chronic): Secondary | ICD-10-CM | POA: Diagnosis not present

## 2021-02-21 LAB — COMPREHENSIVE METABOLIC PANEL
ALT: 37 U/L (ref 0–44)
AST: 22 U/L (ref 15–41)
Albumin: 3.2 g/dL — ABNORMAL LOW (ref 3.5–5.0)
Alkaline Phosphatase: 379 U/L — ABNORMAL HIGH (ref 38–126)
Anion gap: 9 (ref 5–15)
BUN: 17 mg/dL (ref 6–20)
CO2: 24 mmol/L (ref 22–32)
Calcium: 9 mg/dL (ref 8.9–10.3)
Chloride: 104 mmol/L (ref 98–111)
Creatinine, Ser: 0.77 mg/dL (ref 0.44–1.00)
GFR, Estimated: >60 mL/min (ref >60)
Glucose, Bld: 157 mg/dL — ABNORMAL HIGH (ref 70–99)
Potassium: 4 mmol/L (ref 3.5–5.1)
Sodium: 137 mmol/L (ref 135–145)
Total Bilirubin: 0.6 mg/dL (ref 0.3–1.2)
Total Protein: 6.5 g/dL (ref 6.5–8.1)

## 2021-02-21 MED ORDER — HYDROCHLOROTHIAZIDE 25 MG PO TABS: 25.0000 mg | ORAL_TABLET | Freq: Every day | ORAL | 3 refills | Status: DC

## 2021-02-21 MED ORDER — ALBUTEROL SULFATE HFA 108 (90 BASE) MCG/ACT IN AERS: 2.0000 | INHALATION_SPRAY | RESPIRATORY_TRACT | 1 refills | Status: AC | PRN

## 2021-02-21 MED ORDER — DEXAMETHASONE 4 MG PO TABS: 4.0000 mg | ORAL_TABLET | Freq: Two times a day (BID) | ORAL | 1 refills | Status: DC

## 2021-02-21 MED ORDER — FENTANYL 50 MCG/HR TD PT72: 1.0000 | MEDICATED_PATCH | TRANSDERMAL | 0 refills | Status: DC

## 2021-02-21 MED ORDER — MIDAZOLAM HCL 5 MG/5ML IJ SOLN
INTRAMUSCULAR | Status: AC
  Filled 2021-02-21: qty 5

## 2021-02-21 MED ORDER — DEXAMETHASONE 4 MG PO TABS
4.0000 mg | ORAL_TABLET | Freq: Three times a day (TID) | ORAL | Status: DC
  Administered 2021-02-21: 4 mg via ORAL
  Filled 2021-02-21 (×3): qty 1

## 2021-02-21 MED ORDER — OXYCODONE-ACETAMINOPHEN 7.5-325 MG PO TABS: 1.0000 | ORAL_TABLET | Freq: Four times a day (QID) | ORAL | 0 refills | Status: AC | PRN

## 2021-02-21 MED ORDER — AMLODIPINE BESYLATE 5 MG PO TABS: 5.0000 mg | ORAL_TABLET | Freq: Every day | ORAL | 3 refills | Status: DC

## 2021-02-21 MED ORDER — MIDAZOLAM HCL 2 MG/2ML IJ SOLN
INTRAMUSCULAR | Status: AC | PRN
  Administered 2021-02-21: 1 mg via INTRAVENOUS

## 2021-02-21 MED ORDER — ONDANSETRON 4 MG PO TBDP: 4.0000 mg | ORAL_TABLET | Freq: Three times a day (TID) | ORAL | 2 refills | Status: DC | PRN

## 2021-02-21 MED ORDER — ENSURE ENLIVE PO LIQD: 237.0000 mL | Freq: Three times a day (TID) | ORAL | 12 refills | Status: DC

## 2021-02-21 NOTE — Evaluation (Signed)
Physical Therapy Evaluation Patient Details Name: Brenda Middleton MRN: [id number] DOB: 02-10-1961 Today's Date: 02/21/2021   History of Present Illness  Patient is a 60 y.o. F with hx hep C, HTN, IDA, and substance use disorder in remission who presented with back pain. 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 Patient also with pathological L2 fracture with multiple bone lesions    Clinical Impression  Patient sleeping on arrival to room with sister at the bedside. Patient reports she is previously independent with activity without use of assistive device. Patient has some mild unsteadiness with mobility that is self corrected during standing activity. Patient ambulated in hallway without assistive device and without significant loss of balance. No increased pain is reported with activity. Patient appears to have generalized weakness throughout. Recommend PT follow up for higher level balance activity and strengthening. Anticipate patient will be discharged home with intermittent support as needed from her sister.     Follow Up Recommendations Outpatient PT    Equipment Recommendations  None recommended by PT    Recommendations for Other Services       Precautions / Restrictions Precautions Precautions: Fall Restrictions Weight Bearing Restrictions: No      Mobility  Bed Mobility Overal bed mobility: Modified Independent                  Transfers Overall transfer level: Needs assistance Equipment used: None Transfers: Sit to/from Stand Sit to Stand: Supervision         General transfer comment: supervision for safety  Ambulation/Gait Ambulation/Gait assistance:  Supervision;Min guard Gait Distance (Feet): 125 Feet Assistive device: None Gait Pattern/deviations: Step-through pattern;Narrow base of support Gait velocity: decreased   General Gait Details: patient ambulated in hallway without reported increased pain. mild unsteadiness noted.  Stairs            Wheelchair Mobility    Modified Rankin (Stroke Patients Only)       Balance Overall balance assessment: Needs assistance Sitting-balance support: Feet supported Sitting balance-Leahy Scale: Normal     Standing balance support: During functional activity;Single extremity supported Standing balance-Leahy Scale: Good Standing balance comment: patient able to maintain standing balance with unilateral support while brushing teeth at sink                             Pertinent Vitals/Pain Pain Assessment: 0-10 Pain Score: 2  Pain Location: headache Pain Descriptors / Indicators: Discomfort    Home Living Family/patient expects to be discharged to:: Private residence Living Arrangements: Other relatives;Other (Comment) (sister) Available Help at Discharge: Family Type of Home: Apartment Home Access: Level entry     Home Layout: One level Home Equipment: None      Prior Function Level of Independence: Independent               Hand Dominance        Extremity/Trunk Assessment   Upper Extremity Assessment Upper Extremity Assessment: Generalized weakness    Lower Extremity Assessment Lower Extremity Assessment: Generalized weakness       Communication   Communication: No difficulties  Cognition Arousal/Alertness: Awake/alert Behavior During Therapy: WFL for tasks assessed/performed Overall Cognitive Status: Within  Functional Limits for tasks assessed                                        General Comments      Exercises     Assessment/Plan    PT Assessment Patient needs continued PT services  PT Problem List  Decreased strength;Decreased activity tolerance;Decreased balance;Decreased mobility       PT Treatment Interventions DME instruction;Gait training;Functional mobility training;Therapeutic activities;Therapeutic exercise;Balance training;Neuromuscular re-education    PT Goals (Current goals can be found in the Care Plan section)  Acute Rehab PT Goals Patient Stated Goal: to go home today hopefully PT Goal Formulation: With patient Time For Goal Achievement: 03/07/21 Potential to Achieve Goals: Good    Frequency Min 2X/week   Barriers to discharge        Co-evaluation               AM-PAC PT "6 Clicks" Mobility  Outcome Measure Help needed turning from your back to your side while in a flat bed without using bedrails?: None Help needed moving from lying on your back to sitting on the side of a flat bed without using bedrails?: None Help needed moving to and from a bed to a chair (including a wheelchair)?: A Little Help needed standing up from a chair using your arms (e.g., wheelchair or bedside chair)?: A Little Help needed to walk in hospital room?: A Little Help needed climbing 3-5 steps with a railing? : A Little 6 Click Score: 20    End of Session   Activity Tolerance: Patient tolerated treatment well Patient left: in bed;with call bell/phone within reach;with family/visitor present Nurse Communication: Mobility status PT Visit Diagnosis: Unsteadiness on feet (R26.81);Muscle weakness (generalized) (M62.81)    Time: 4103-0131 PT Time Calculation (min) (ACUTE ONLY): 15 min   Charges:   PT Evaluation $PT Eval Low Complexity: 1 Low PT Treatments $Therapeutic Activity: 8-22 mins        Minna Merritts, PT, MPT  Percell Locus 02/21/2021, 1:15 PM

## 2021-02-21 NOTE — Plan of Care (Signed)
  Problem: Education: Goal: Knowledge of General Education information will improve Description: Including pain rating scale, medication(s)/side effects and non-pharmacologic comfort measures 02/21/2021 1723 by Sameeha Rockefeller Bet, LPN Outcome: Adequate for Discharge 02/21/2021 1120 by Atreyu Mak Bet, LPN Outcome: Progressing   Problem: Health Behavior/Discharge Planning: Goal: Ability to manage health-related needs will improve 02/21/2021 1723 by Khamron Gellert Bet, LPN Outcome: Adequate for Discharge 02/21/2021 1120 by Everlene Cunning Bet, LPN Outcome: Progressing   Problem: Clinical Measurements: Goal: Ability to maintain clinical measurements within normal limits will improve 02/21/2021 1723 by Cletus Paris Bet, LPN Outcome: Adequate for Discharge 02/21/2021 1120 by Dev Dhondt Bet, LPN Outcome: Progressing Goal: Will remain free from infection 02/21/2021 1723 by Scherry Laverne Bet, LPN Outcome: Adequate for Discharge 02/21/2021 1120 by Pristine Gladhill Bet, LPN Outcome: Progressing Goal: Diagnostic test results will improve 02/21/2021 1723 by Marnee Sherrard Bet, LPN Outcome: Adequate for Discharge 02/21/2021 1120 by Natasja Niday Bet, LPN Outcome: Progressing Goal: Respiratory complications will improve 02/21/2021 1723 by Greydon Betke Bet, LPN Outcome: Adequate for Discharge 02/21/2021 1120 by Ayan Yankey Bet, LPN Outcome: Progressing Goal: Cardiovascular complication will be avoided 02/21/2021 1723 by Brice Potteiger Bet, LPN Outcome: Adequate for Discharge 02/21/2021 1120 by Braniyah Besse Bet, LPN Outcome: Progressing   Problem: Activity: Goal: Risk for activity intolerance will decrease 02/21/2021 1723 by Jovan Schickling Bet, LPN Outcome: Adequate for Discharge 02/21/2021 1120 by Deadra Diggins Bet, LPN Outcome: Progressing   Problem: Nutrition: Goal: Adequate nutrition will be maintained 02/21/2021 1723 by Madilynn Montante Bet, LPN Outcome: Adequate for Discharge 02/21/2021 1120 by Daneya Hartgrove Bet, LPN Outcome: Progressing    Problem: Coping: Goal: Level of anxiety will decrease 02/21/2021 1723 by Bryn Saline Bet, LPN Outcome: Adequate for Discharge 02/21/2021 1120 by Dalis Beers Bet, LPN Outcome: Progressing   Problem: Elimination: Goal: Will not experience complications related to bowel motility 02/21/2021 1723 by Rawn Quiroa Bet, LPN Outcome: Adequate for Discharge 02/21/2021 1120 by Sheera Illingworth Bet, LPN Outcome: Progressing Goal: Will not experience complications related to urinary retention 02/21/2021 1723 by Cait Locust Bet, LPN Outcome: Adequate for Discharge 02/21/2021 1120 by Jemila Camille Bet, LPN Outcome: Progressing   Problem: Pain Managment: Goal: General experience of comfort will improve 02/21/2021 1723 by Haeley Fordham Bet, LPN Outcome: Adequate for Discharge 02/21/2021 1120 by Magnus Crescenzo Bet, LPN Outcome: Progressing   Problem: Safety: Goal: Ability to remain free from injury will improve 02/21/2021 1723 by Krystan Northrop Bet, LPN Outcome: Adequate for Discharge 02/21/2021 1120 by Nailani Full Bet, LPN Outcome: Progressing   Problem: Skin Integrity: Goal: Risk for impaired skin integrity will decrease 02/21/2021 1723 by Kanye Depree Bet, LPN Outcome: Adequate for Discharge 02/21/2021 1120 by Seferino Oscar Bet, LPN Outcome: Progressing   Problem: Inadequate Intake (NI-2.1) Goal: Food and/or nutrient delivery Description: Individualized approach for food/nutrient provision. Outcome: Adequate for Discharge   Problem: Acute Rehab PT Goals(only PT should resolve) Goal: Patient Will Transfer Sit To/From Stand Outcome: Adequate for Discharge Goal: Pt Will Perform Standing Balance Or Pre-Gait Outcome: Adequate for Discharge Goal: Pt Will Ambulate Outcome: Adequate for Discharge

## 2021-02-21 NOTE — Discharge Summary (Signed)
Physician Discharge Summary  Brenda Middleton YNW:[id number] DOB: 1961/11/03 DOA: 02/18/2021  PCP: Remi Haggard, FNP  Admit date: 02/18/2021 Discharge date: 02/21/2021  Admitted From: Home  Disposition:  Home   Recommendations for Outpatient Follow-up:  1. Follow up with Dr. Tasia Catchings in 2 days 2. Follow up with Orthopedics as needed 3. Dr. Kayleen Memos: Please review patient's PT and dry needling regimen, and advise if she should continue or not in setting of cancer 4. Dr. Tasia Catchings: Please recheck LFTs on Percocet 5. Dr. Tasia Catchings: Please recheck BP on amlodipine/HCTZ and reduce dosing if needed     Home Health: None  Equipment/Devices: None new  Discharge Condition: Stable  CODE STATUS: FULL Diet recommendation: Regular  Brief/Interim Summary: Brenda Middleton is a 60 y.o. F with hx hep C, HTN, IDA, and substance use disorder in remission who presented with back pain.  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  She was discharged home but had intractable pain and so returned.       PRINCIPAL HOSPITAL DIAGNOSIS: New diagnosis metastatic cancer unknown primary    Discharge Diagnoses:   Suspected cancer, likely lung primary, metastatic to bone, lung, lymph node, and liver Admited and underwent core needle biopsy of supraclavicular lymph node.    Biopsy to be follow up with Dr. Tasia Catchings in 2 days.    L2 vertebral compression fracture Cancer related pain Started on fentanyl patch 25 mcg/hr and oxycodone.    Titrated up to fentanyl 50 mcg/hr and oxycodone-acetaminophen 7.5-325 with dexamethasone BID at the time of discharge with good pain  relief.  Has Palliative Care follow up.       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    Anxiety Continue BuSpar, quetiapine  GERD Continue pantoprazole  COPD/emphysema Some wheezing.  Restart Dulera, use extra albuterol.    Hypertension Started new amlodipine and HCTZ   Transaminitis In setting of metastasis.   -Trend LFTs            Discharge Instructions  Discharge Instructions    Diet - low sodium heart healthy   Complete by: As directed    Discharge instructions   Complete by: As directed    Go see Dr. Tasia Catchings on Wednesday  For pain: Use a fentanyl patch 50 mcg/hr (replace this every 3 days) Use oxycodone-acetaminophen 7.5-325 mg (Percocet) up to every 6 hours for breakthrough pain Take Decadron/dexamethasone 4 mg twice daily  Get refills of fentanyl and Percocet from Dr. Tasia Catchings or Georgiann Cocker on Wednesday Ask Dr. Tasia Catchings if you should continue dexamethasone  If you take Percocet, this has Tylenol/acetaminophen in it, you don't need to take extra Tylenol in addition, this will be enough   For nausea You may take the phenergan you have I have also refilled your ondansetron/Zofran  YOu should continue gabapentin, this will be effective for pain  While you are taking Decadron/dexamethasone, you don't need to take Mobic/meloxicam or ketorolac or other NSAIDs (they are similar)  I wrote a refill of albuterol Continue your home Symbicort    You should also take the new blood pressure medicines: Take amlodipine 5 mg daily Take HCTZ 25  mg daily   Increase activity slowly   Complete by: As directed      Allergies as of 02/21/2021      Reactions   Penicillin G Hives   Other reaction(s): HIVES Other reaction(s): HIVES   Penicillin V Itching   Penicillins       Medication List    STOP taking these medications   doxycycline 100 MG capsule Commonly known as: VIBRAMYCIN    furosemide 20 MG tablet Commonly known as: LASIX   ketorolac 10 MG tablet Commonly known as: TORADOL   Meloxicam 10 MG Caps   oxyCODONE-acetaminophen 5-325 MG tablet Commonly known as: Percocet Replaced by: oxyCODONE-acetaminophen 7.5-325 MG tablet     TAKE these medications   albuterol 108 (90 Base) MCG/ACT inhaler Commonly known as: VENTOLIN HFA Inhale 2 puffs into the lungs every 4 (four) hours as needed for wheezing or shortness of breath.   amLODipine 5 MG tablet Commonly known as: NORVASC Take 1 tablet (5 mg total) by mouth daily. Start taking on: February 22, 2021   budesonide-formoterol 80-4.5 MCG/ACT inhaler Commonly known as: SYMBICORT Inhale 2 puffs into the lungs 2 (two) times daily.   busPIRone 10 MG tablet Commonly known as: BUSPAR Take 10 mg by mouth 2 (two) times daily. Patient is unsure of dose   dexamethasone 4 MG tablet Commonly known as: DECADRON Take 1 tablet (4 mg total) by mouth 2 (two) times daily.   feeding supplement Liqd Take 237 mLs by mouth 3 (three) times daily between meals.   fentaNYL 50 MCG/HR Commonly known as: [place] 1 patch onto the skin every 3 (three) days for 6 days.   gabapentin 300 MG capsule Commonly known as: NEURONTIN Take 300 mg by mouth daily.   hydrochlorothiazide 25 MG tablet Commonly known as: HYDRODIURIL Take 1 tablet (25 mg total) by mouth daily. Start taking on: February 22, 2021   hydrOXYzine 25 MG tablet Commonly known as: ATARAX/VISTARIL hydroxyzine HCl 25 mg tablet   ondansetron 4 MG disintegrating tablet Commonly known as: ZOFRAN-ODT Take 1 tablet (4 mg total) by mouth every 8 (eight) hours as needed.   oxyCODONE-acetaminophen 7.5-325 MG tablet Commonly known as: Percocet Take 1 tablet by mouth every 6 (six) hours as needed for up to 5 days for severe pain. Replaces: oxyCODONE-acetaminophen 5-325 MG tablet   pantoprazole 40 MG tablet Commonly known as: PROTONIX Take 40 mg by mouth daily.    prochlorperazine 10 MG tablet Commonly known as: COMPAZINE Take 10 mg by mouth every 6 (six) hours as needed for nausea or vomiting.   promethazine 12.5 MG tablet Commonly known as: PHENERGAN Take 1 tablet (12.5 mg total) by mouth every 6 (six) hours as needed for nausea or vomiting.   QUEtiapine 400 MG tablet Commonly known as: SEROQUEL Take 400 mg by mouth at bedtime.       Follow-up Information    Earlie Server, MD. Go to.   Specialty: Oncology Why: 3/23 Wednesday at 1:30 PM Contact information: [place] [address] [phone]              Allergies  Allergen Reactions  . Penicillin G Hives    Other reaction(s): HIVES Other reaction(s): HIVES  . Penicillin V Itching  . Penicillins     Consultations:  Oncology  Paliative care   Procedures/Studies: DG Chest 2 View  Result Date: 02/15/2021 CLINICAL DATA:  Weight loss and cough EXAM: CHEST - 2 VIEW COMPARISON:  None. FINDINGS: The heart  size and mediastinal contours are within normal limits. Rounded patchy nodular opacity seen within the right upper lobe and right mid lung. The left lung is clear. No acute osseous abnormality. IMPRESSION: Rounded patchy airspace opacities in the right mid lung which may be due to infectious etiology, however cannot exclude metastatic disease. Would recommend CT chest with contrast for further evaluation. Electronically Signed   By: Prudencio Pair M.D.   On: 02/15/2021 12:16   CT Head Wo Contrast  Result Date: 02/15/2021 CLINICAL DATA:  Headache with left-sided face/neck/head pain. On antibiotics for reported laryngitis with bilateral ear pain. EXAM: CT HEAD WITHOUT CONTRAST CT MAXILLOFACIAL WITHOUT CONTRAST TECHNIQUE: Multidetector CT imaging of the head and maxillofacial structures were performed using the standard protocol without intravenous contrast. Multiplanar CT image reconstructions of the maxillofacial structures were also generated. COMPARISON:  None.  FINDINGS: CT HEAD FINDINGS Brain: The calvarial/skull base mass lesion is described below. Spur this process exerts mass effect on the left cerebellum with suspected narrowing of the fourth ventricle. No evidence of hydrocephalus. No evidence of acute large vascular territory infarct. No acute hemorrhage. Mild to moderate scattered white matter hypodensities, which are nonspecific but most likely relate to chronic microvascular ischemic disease. No midline shift. Vascular: No hyperdense vessel identified. Calcific atherosclerosis. Skull/skull base: There is aggressive soft tissue mass lesion involving the left occipital calvarium measuring up to approximately 5.5 x 3.1 by 3.3 cm (transverse by AP by craniocaudal) with bony destruction and expansion and extension inferiorly to involve the left temporal bone and skull base. There is mass effect on the inferior left cerebellum and possible involvement of the subjacent dural venous sinuses. Small left mastoid effusion. CT MAXILLOFACIAL FINDINGS Osseous: No fracture or mandibular dislocation. No destructive process in the face. Orbits: Negative. No traumatic or inflammatory finding. Sinuses: Small right maxillary sinus retention cyst. Otherwise, sinuses are clear. Soft tissues: Negative. 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 Resulting mass effect on the left cerebellum and possible involvement of the subjacent dural venous sinuses. 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 Osteomyelitis is a differential consideration. Recommend MRI brain with and without contrast to further evaluate. Findings and recommendations discussed with Ashok Cordia, PA via telephone at 11:20 a.m. Electronically Signed   By: Margaretha Sheffield MD   On: 02/15/2021 11:29   CT Thoracic Spine Wo  Contrast  Result Date: 02/18/2021 CLINICAL DATA:  Metastases on CT abdomen EXAM: CT THORACIC AND LUMBAR SPINE WITHOUT CONTRAST TECHNIQUE: Multidetector CT imaging of the thoracic and lumbar spine was performed without contrast. Multiplanar CT image reconstructions were also generated. COMPARISON:  None. FINDINGS: CT THORACIC SPINE FINDINGS Alignment: Preserved. Vertebrae: There is a small partially sclerotic lesion at the right aspect of the T5 vertebral body. A lytic lesion is present at the anterior aspect T9 likely with pathologic fracture but no substantial height loss. Ill-defined lucent lesions are present elsewhere. Paraspinal and other soft tissues: Better evaluated on prior dedicated imaging. Disc levels: Multilevel degenerative changes. No high-grade degenerative canal narrowing. CT LUMBAR SPINE FINDINGS Segmentation: 5 lumbar type vertebrae. Alignment: Dextrocurvature.  Grade 1 anterolisthesis at L4-L5 Vertebrae: Lucency and sclerosis at L2 with pathologic compression fracture resulting in less than 50% loss of height at the superior endplate. Sclerotic lesion of the right sacrum. Paraspinal and other soft tissues: Better evaluated on prior dedicated imaging.  Disc levels: Multilevel degenerative changes. Canal stenosis is greatest at L3-L4 and L4-L5. Foraminal narrowing is greatest on the right at L5-S1. IMPRESSION: Sclerotic and lytic metastatic lesions as already identified on the CT chest, abdomen, and pelvis. Pathologic L2 fracture with less than 50% loss of height. Probable pathologic fracture at T9 without height loss. No apparent significant epidural disease by CT. Electronically Signed   By: Macy Mis M.D.   On: 02/18/2021 10:53   CT Lumbar Spine Wo Contrast  Result Date: 02/18/2021 CLINICAL DATA:  Metastases on CT abdomen EXAM: CT THORACIC AND LUMBAR SPINE WITHOUT CONTRAST TECHNIQUE: Multidetector CT imaging of the thoracic and lumbar spine was performed without contrast. Multiplanar  CT image reconstructions were also generated. COMPARISON:  None. FINDINGS: CT THORACIC SPINE FINDINGS Alignment: Preserved. Vertebrae: There is a small partially sclerotic lesion at the right aspect of the T5 vertebral body. A lytic lesion is present at the anterior aspect T9 likely with pathologic fracture but no substantial height loss. Ill-defined lucent lesions are present elsewhere. Paraspinal and other soft tissues: Better evaluated on prior dedicated imaging. Disc levels: Multilevel degenerative changes. No high-grade degenerative canal narrowing. CT LUMBAR SPINE FINDINGS Segmentation: 5 lumbar type vertebrae. Alignment: Dextrocurvature.  Grade 1 anterolisthesis at L4-L5 Vertebrae: Lucency and sclerosis at L2 with pathologic compression fracture resulting in less than 50% loss of height at the superior endplate. Sclerotic lesion of the right sacrum. Paraspinal and other soft tissues: Better evaluated on prior dedicated imaging. Disc levels: Multilevel degenerative changes. Canal stenosis is greatest at L3-L4 and L4-L5. Foraminal narrowing is greatest on the right at L5-S1. IMPRESSION: Sclerotic and lytic metastatic lesions as already identified on the CT chest, abdomen, and pelvis. Pathologic L2 fracture with less than 50% loss of height. Probable pathologic fracture at T9 without height loss. No apparent significant epidural disease by CT. Electronically Signed   By: Macy Mis M.D.   On: 02/18/2021 10:53   MR Brain W and Wo Contrast  Result Date: 02/15/2021 CLINICAL DATA:  Abnormal head CT.  Skull base mass. EXAM: MRI HEAD WITHOUT AND WITH CONTRAST TECHNIQUE: Multiplanar, multiecho pulse sequences of the brain and surrounding structures were obtained without and with intravenous contrast. CONTRAST:  63mL GADAVIST GADOBUTROL 1 MMOL/ML IV SOLN COMPARISON:  CT head 02/15/2021 FINDINGS: Brain: Large destructive mass in the posterior skull base bilaterally left greater than right. This involves the  occipital bone with extension into the left temporal bone. The occipital bone is significantly expanded due to tumor on the left with mass-effect on the left cerebellum. Soft tissue mass associated with left occipital lesion measures approximately 5.2 x 3.0 cm. Mild edema in the left cerebellum. There is infiltrating tumor in the left temporal bone which is more sizable than would be predicted from the CT. The soft tissue mass associated with the left occipital bone shows susceptibility which may be due to mineralization and/or bone fragments from bone destruction. Ventricle size normal. No midline shift. Moderate white matter changes likely due to chronic microvascular ischemia. No acute infarct. Chronic microhemorrhage in the left parietal lobe. Subtle enhancing lesions in the brain in the right frontal lobe measuring approximately 10 mm, and 4 mm best seen on coronal and sagittal postcontrast imaging. This is suspicious for metastatic disease. Vascular: Normal arterial flow voids. Normal venous enhancement. No evidence of venous sinus thrombosis. Skull and upper cervical spine: Infiltrating destructive mass in the occipital bone bilaterally left greater than right with extension into the left temporal bone. Additional lesions  in the frontal bone bilaterally and in the left parietal bone likely metastatic disease. Sinuses/Orbits: Mild mucosal edema paranasal sinuses. Negative orbit Other: None IMPRESSION: Destructive mass lesion in the occipital bone bilaterally left greater than right. There is associated soft tissue mass in the left occipital bone extending into the posterior fossa with mass-effect and mild edema of the left cerebellum. This mass extends into the left temporal bone. Additional bone lesions are present in the frontal bones and left parietal bone. Subtle enhancing lesions right frontal lobe measuring 10 mm and 4 mm. Findings compatible with metastatic disease. Electronically Signed   By: Franchot Gallo M.D.   On: 02/15/2021 13:47   US Renal  Result Date: 02/18/2021 CLINICAL DATA:  Back pain. Right renal pelvis fullness on right upper quadrant ultrasound performed 1 day prior EXAM: RENAL / URINARY TRACT ULTRASOUND COMPLETE COMPARISON:  02/17/2021 abdominal sonogram. FINDINGS: Right Kidney: Renal measurements: 10.3 x 4.2 x 4.2 cm = volume: 95 mL. Normal parenchymal echogenicity and thickness. Mild right hydronephrosis. Possible 5 mm right ureteropelvic junction stone. Nonobstructing 3 mm lower right renal stone. No renal masses. Left Kidney: Renal measurements: 10.0 x 4.8 x 4.2 cm = volume: 107 mL. Normal parenchymal echogenicity and thickness. No left hydronephrosis. No renal masses. Probable nonobstructing 7 mm upper left renal stone. Bladder: Appears normal for degree of bladder distention. Bilateral ureteral jets seen in the bladder. Other: None. IMPRESSION: 1. Mild right hydronephrosis. Possible 5 mm right UPJ stone. Nonobstructing 3 mm lower right renal stone. Suggest unenhanced CT abdomen/pelvis for further evaluation. 2. Probable nonobstructing upper left renal stone. Electronically Signed   By: Ilona Sorrel M.D.   On: 02/18/2021 12:02   CT CHEST ABDOMEN PELVIS W CONTRAST  Result Date: 02/15/2021 CLINICAL DATA:  Altered mental status. 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 EXAM: CT CHEST, ABDOMEN, AND PELVIS WITH CONTRAST TECHNIQUE: Multidetector CT imaging of the chest, abdomen and pelvis was performed following the standard protocol during bolus administration of intravenous contrast. CONTRAST:  75 mL OMNIPAQUE IOHEXOL 300 MG/ML  SOLN COMPARISON:  None. FINDINGS: CT CHEST FINDINGS Cardiovascular: No significant vascular findings. Normal heart size. No pericardial effusion. Mediastinum/Nodes: The patient has extensive lymphadenopathy. A 2 cm left axillary node is seen on image 11 of series 2; a second left axillary node on image 14 measures 1.4 cm. A 1.1 cm subpectoral  node is seen on the left on image 15 of series 2. A 1.4 cm left supraclavicular node is seen on image 9. Right paratracheal node on image 15 measures 1.9 cm. Right precarinal nodal mass on image 25 measures 2.6 cm and there is a right hilar nodal mass measuring 4.2 x 3.1 cm on image 30. The esophagus and thyroid are negative. Lungs/Pleura: Small right pleural effusion. A spiculated nodule in the right middle lobe measures 2.6 cm transverse by 2.7 cm AP on image 77, series 4. Scattered pulmonary nodules include a 0.5 cm nodule in the superior segment of the left lower lobe on image 80, 0.4 cm left upper lobe nodule on image 76 and 2 punctate nodules on image 83. Additional smaller nodules are seen scattered through both lungs. The lungs are emphysematous. No pleural effusion. Musculoskeletal: A 0.9 cm lytic lesion is seen in T5, lytic lesions are seen in T7 and T9, larger in T9. There is also a small lytic lesion in T10. CT ABDOMEN PELVIS FINDINGS Hepatobiliary: Metastatic lesions are seen in the left hepatic lobe measuring 1.7 cm  on image 57, series 2 and near the dome of the liver on image 50 of series 2 where a 1.1 cm lesion is identified. A second lesion near the dome of the liver in the right hepatic lobe on image 50 measures 0.7 cm. There is mild intra and extrahepatic biliary ductal dilatation likely related to prior cholecystectomy. Pancreas: Unremarkable. No pancreatic ductal dilatation or surrounding inflammatory changes. Spleen: Normal in size without focal abnormality. Adrenals/Urinary Tract: Adrenal glands are unremarkable. Kidneys are normal, without renal calculi, focal lesion, or hydronephrosis. Bladder is unremarkable. Stomach/Bowel: Stomach is within normal limits. Appendix appears normal. No evidence of bowel wall thickening, distention, or inflammatory changes. Moderately large colonic stool burden throughout noted. Vascular/Lymphatic: Aortic atherosclerosis. No aneurysm. No pathologic  lymphadenopathy by CT size criteria. Reproductive: Status post hysterectomy. No adnexal masses. Other: None. Musculoskeletal: Mottled appearance of the L2 vertebral body is consistent with metastatic disease. There is a mild pathologic superior endplate compression fracture of L2 with vertebral body height loss centrally of approximately 30%. IMPRESSION: Findings consistent with extensive metastatic carcinoma likely emanating from a 2.7 cm right middle lobe pulmonary nodule. Metastases include extensive lymphadenopathy in the chest,, pulmonary nodule bone lesions and liver lesions. Mild pathologic fracture of L2 with vertebral body height loss centrally of up to approximately 30% is again noted. Aortic Atherosclerosis (ICD10-I70.0) and Emphysema (ICD10-J43.9). Electronically Signed   By: Inge Rise M.D.   On: 02/15/2021 14:25   [place] CORE BIOPSY (LYMPH NODES)  Result Date: 02/21/2021 INDICATION: Multiple enlarged lymph nodes, right middle lobe lung mass, liver lesions and bone lesions. EXAM: ULTRASOUND GUIDED CORE BIOPSY OF LEFT SUPRACLAVICULAR LYMPH NODE MEDICATIONS: None. ANESTHESIA/SEDATION: Versed 2.0 mg IV Moderate Sedation Time:  12 minutes. The patient was continuously monitored during the procedure by the interventional radiology nurse under my direct supervision. PROCEDURE: The procedure, risks, benefits, and alternatives were explained to the patient. Questions regarding the procedure were encouraged and answered. The patient understands and consents to the procedure. A time-out was performed prior to initiating the procedure. The left neck was prepped with chlorhexidine in a sterile fashion, and a sterile drape was applied covering the operative field. A sterile gown and sterile gloves were used for the procedure. Local anesthesia was provided with 1% Lidocaine. After localizing an enlarged left supraclavicular lymph node by ultrasound, multiple 18 gauge core biopsy samples were obtained. Core  biopsy samples were submitted in formalin as well as on saline soaked Telfa gauze. Additional ultrasound was performed. COMPLICATIONS: None immediate. FINDINGS: An enlarged left supraclavicular lymph node measures approximately 3.0 x 1.8 x 2.9 cm. Solid core biopsy samples were obtained. IMPRESSION: Ultrasound-guided core biopsy performed of an enlarged left supraclavicular lymph node measuring 3 cm in greatest dimensions. Electronically Signed   By: Aletta Edouard M.D.   On: 02/21/2021 15:31   CT Maxillofacial Wo Contrast  Result Date: 02/15/2021 CLINICAL DATA:  Headache with left-sided face/neck/head pain. On antibiotics for reported laryngitis with bilateral ear pain. EXAM: CT HEAD WITHOUT CONTRAST CT MAXILLOFACIAL WITHOUT CONTRAST TECHNIQUE: Multidetector CT imaging of the head and maxillofacial structures were performed using the standard protocol without intravenous contrast. Multiplanar CT image reconstructions of the maxillofacial structures were also generated. COMPARISON:  None. FINDINGS: CT HEAD FINDINGS Brain: The calvarial/skull base mass lesion is described below. Spur this process exerts mass effect on the left cerebellum with suspected narrowing of the fourth ventricle. No evidence of hydrocephalus. No evidence of acute large vascular territory infarct. No acute hemorrhage. Mild to moderate  scattered white matter hypodensities, which are nonspecific but most likely relate to chronic microvascular ischemic disease. No midline shift. Vascular: No hyperdense vessel identified. Calcific atherosclerosis. Skull/skull base: There is aggressive soft tissue mass lesion involving the left occipital calvarium measuring up to approximately 5.5 x 3.1 by 3.3 cm (transverse by AP by craniocaudal) with bony destruction and expansion and extension inferiorly to involve the left temporal bone and skull base. There is mass effect on the inferior left cerebellum and possible involvement of the subjacent dural  venous sinuses. Small left mastoid effusion. CT MAXILLOFACIAL FINDINGS Osseous: No fracture or mandibular dislocation. No destructive process in the face. Orbits: Negative. No traumatic or inflammatory finding. Sinuses: Small right maxillary sinus retention cyst. Otherwise, sinuses are clear. Soft tissues: Negative. 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 Resulting mass effect on the left cerebellum and possible involvement of the subjacent dural venous sinuses. 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 Osteomyelitis is a differential consideration. Recommend MRI brain with and without contrast to further evaluate. Findings and recommendations discussed with Ashok Cordia, PA via telephone at 11:20 a.m. Electronically Signed   By: Margaretha Sheffield MD   On: 02/15/2021 11:29   US Abdomen Limited RUQ (LIVER/GB)  Result Date: 02/17/2021 CLINICAL DATA:  Nausea and vomiting for 1 day. EXAM: ULTRASOUND ABDOMEN LIMITED RIGHT UPPER QUADRANT COMPARISON:  CT chest, abdomen and pelvis 02/15/2021. FINDINGS: Gallbladder: Removed. Common bile duct: Diameter: 0.8 cm. Liver: Three hypoechoic liver lesions measuring up to 1.8 cm are identified are identified and correlate with metastatic deposit seen on prior CT. Mild intrahepatic biliary ductal dilatation seen on CT is also noted. Portal vein is patent on color Doppler imaging with normal direction of blood flow towards the liver. Other: There is fullness of the right renal pelvis which is new since the patient's CT scan yesterday. IMPRESSION: Three hypoechoic liver lesions measuring up to 1.8 cm correlate with metastatic deposit seen on CT. Status post cholecystectomy. Mild intra and extrahepatic biliary ductal dilatation is likely related to cholecystectomy. No obstructing lesion is  identified. Fullness of the right renal pelvis is new since yesterday's examination. This could be incidental. Correlation with dedicated renal ultrasound could be used for further evaluation if indicated. Electronically Signed   By: Inge Rise M.D.   On: 02/17/2021 12:53       Subjective: Headache still present but better.  No confusion, no seizures.  No vomiting.  Back pain improved.  Discharge Exam: Vitals:   02/21/21 1500 02/21/21 1615  BP: (!) 147/82 135/73  Pulse: 87 (!) 107  Resp: 16 17  Temp:  98.1 F (36.7 C)  SpO2: 92% 94%   Vitals:   02/21/21 1450 02/21/21 1455 02/21/21 1500 02/21/21 1615  BP: (!) 157/87 (!) 145/87 (!) 147/82 135/73  Pulse: (!) 104 92 87 (!) 107  Resp: (!) 27 19 16 17   Temp:    98.1 F (36.7 C)  TempSrc:      SpO2: 98% 100% 92% 94%  Weight:      Height:        General: Pt is alert, awake, not in acute distress Cardiovascular: RRR, nl S1-S2, no murmurs appreciated.   No LE edema.   Respiratory: Normal respiratory rate and rhythm.  CTAB without rales or wheezes. Abdominal: Abdomen soft and non-tender.  No distension or HSM.  Neuro/Psych: Strength symmetric in upper and lower extremities.  Judgment and insight appear normal.   The results of significant diagnostics from this hospitalization (including imaging, microbiology, ancillary and laboratory) are listed below for reference.     Microbiology: Recent Results (from the past 240 hour(s))  Resp Panel by RT-PCR (Flu A&B, Covid) Nasopharyngeal Swab     Status: None   Collection Time: 02/18/21  9:24 AM   Specimen: Nasopharyngeal Swab; Nasopharyngeal(NP) swabs in vial transport medium  Result Value Ref Range Status   SARS Coronavirus 2 by RT PCR NEGATIVE NEGATIVE Final    Comment: (NOTE) SARS-CoV-2 target nucleic acids are NOT DETECTED.  The SARS-CoV-2 RNA is generally detectable in upper respiratory specimens during the acute phase of infection. The lowest concentration of  SARS-CoV-2 viral copies this assay can detect is 138 copies/mL. A negative result does not preclude SARS-Cov-2 infection and should not be used as the sole basis for treatment or other patient management decisions. A negative result may occur with  improper specimen collection/handling, submission of specimen other than nasopharyngeal swab, presence of viral mutation(s) within the areas targeted by this assay, and inadequate number of viral copies(<138 copies/mL). A negative result must be combined with clinical observations, patient history, and epidemiological information. The expected result is Negative.  Fact Sheet for Patients:  EntrepreneurPulse.com.au  Fact Sheet for Healthcare Providers:  IncredibleEmployment.be  This test is no t yet approved or cleared by the [place] FDA and  has been authorized for detection and/or diagnosis of SARS-CoV-2 by FDA under an Emergency Use Authorization (EUA). This EUA will remain  in effect (meaning this test can be used) for the duration of the COVID-19 declaration under Section 564(b)(1) of the Act, 21 U.S.C.section 360bbb-3(b)(1), unless the authorization is terminated  or revoked sooner.       Influenza A by PCR NEGATIVE NEGATIVE Final   Influenza B by PCR NEGATIVE NEGATIVE Final    Comment: (NOTE) The Xpert Xpress SARS-CoV-2/FLU/RSV plus assay is intended as an aid in the diagnosis of influenza from Nasopharyngeal swab specimens and should not be used as a sole basis for treatment. Nasal washings and aspirates are unacceptable for Xpert Xpress SARS-CoV-2/FLU/RSV testing.  Fact Sheet for Patients: EntrepreneurPulse.com.au  Fact Sheet for Healthcare Providers: IncredibleEmployment.be  This test is not yet approved or cleared by the [place] FDA and has been authorized for detection and/or diagnosis of SARS-CoV-2 by FDA under an Emergency Use  Authorization (EUA). This EUA will remain in effect (meaning this test can be used) for the duration of the COVID-19 declaration under Section 564(b)(1) of the Act, 21 U.S.C. section 360bbb-3(b)(1), unless the authorization is terminated or revoked.  Performed at [facility], [address] [place], [place] [address]   MRSA PCR Screening     Status: None   Collection Time: 02/19/21  4:22 PM   Specimen: Nasopharyngeal  Result Value Ref Range Status   MRSA by PCR NEGATIVE NEGATIVE Final    Comment:        The GeneXpert MRSA Assay (FDA approved for NASAL specimens only), is one component of a comprehensive MRSA colonization surveillance program. It is not intended to diagnose MRSA infection nor to guide or monitor treatment for MRSA infections. Performed at [facility], [address] [place], [place] [address]      Labs: BNP (last 3 results) No results for input(s): BNP in the last 8760 hours. Basic Metabolic Panel: Recent Labs  Lab 02/15/21 1042 02/17/21 6761  02/18/21 0934 02/21/21 0418  NA 142 137 137 137  K 4.0 3.7 4.3 4.0  CL 108 104 101 104  CO2 27 24 28 24   GLUCOSE 97 100* 118* 157*  BUN 28* 21* 18 17  CREATININE 0.91 0.72 0.82 0.77  CALCIUM 8.9 8.5* 8.9 9.0   Liver Function Tests: Recent Labs  Lab 02/15/21 1042 02/17/21 0821 02/18/21 0934 02/21/21 0418  AST 32 84* 47* 22  ALT 35 106* 77* 37  ALKPHOS 224* 432* 447* 379*  BILITOT 0.5 1.2 0.9 0.6  PROT 6.7 6.2* 6.5 6.5  ALBUMIN 3.3* 3.2* 3.2* 3.2*   Recent Labs  Lab 02/17/21 0821  LIPASE 62*   No results for input(s): AMMONIA in the last 168 hours. CBC: Recent Labs  Lab 02/15/21 1042 02/17/21 0821 02/18/21 0934  WBC 12.1* 11.9* 9.7  NEUTROABS 8.8* 9.4* 7.6  HGB 11.3* 13.2 11.7*  HCT 34.2* 38.5 34.7*  MCV 87.2 85.2 85.7  PLT 129* 159 156   Cardiac Enzymes: No results for input(s): CKTOTAL, CKMB, CKMBINDEX, TROPONINI in the last 168 hours. BNP: Invalid  input(s): POCBNP CBG: No results for input(s): GLUCAP in the last 168 hours. D-Dimer No results for input(s): DDIMER in the last 72 hours. Hgb A1c No results for input(s): HGBA1C in the last 72 hours. Lipid Profile No results for input(s): CHOL, HDL, LDLCALC, TRIG, CHOLHDL, LDLDIRECT in the last 72 hours. Thyroid function studies No results for input(s): TSH, T4TOTAL, T3FREE, THYROIDAB in the last 72 hours.  Invalid input(s): FREET3 Anemia work up No results for input(s): VITAMINB12, FOLATE, FERRITIN, TIBC, IRON, RETICCTPCT in the last 72 hours. Urinalysis    Component Value Date/Time   COLORURINE YELLOW (A) 02/18/2021 0924   APPEARANCEUR HAZY (A) 02/18/2021 0924   APPEARANCEUR Clear 11/04/2013 1649   LABSPEC 1.015 02/18/2021 0924   LABSPEC 1.009 11/04/2013 1649   PHURINE 6.0 02/18/2021 0924   GLUCOSEU NEGATIVE 02/18/2021 0924   GLUCOSEU Negative 11/04/2013 1649   HGBUR SMALL (A) 02/18/2021 0924   BILIRUBINUR NEGATIVE 02/18/2021 0924   BILIRUBINUR Negative 11/04/2013 1649   KETONESUR NEGATIVE 02/18/2021 0924   PROTEINUR NEGATIVE 02/18/2021 0924   NITRITE NEGATIVE 02/18/2021 0924   LEUKOCYTESUR NEGATIVE 02/18/2021 0924   LEUKOCYTESUR Negative 11/04/2013 1649   Sepsis Labs Invalid input(s): PROCALCITONIN,  WBC,  LACTICIDVEN Microbiology Recent Results (from the past 240 hour(s))  Resp Panel by RT-PCR (Flu A&B, Covid) Nasopharyngeal Swab     Status: None   Collection Time: 02/18/21  9:24 AM   Specimen: Nasopharyngeal Swab; Nasopharyngeal(NP) swabs in vial transport medium  Result Value Ref Range Status   SARS Coronavirus 2 by RT PCR NEGATIVE NEGATIVE Final    Comment: (NOTE) SARS-CoV-2 target nucleic acids are NOT DETECTED.  The SARS-CoV-2 RNA is generally detectable in upper respiratory specimens during the acute phase of infection. The lowest concentration of SARS-CoV-2 viral copies this assay can detect is 138 copies/mL. A negative result does not preclude  SARS-Cov-2 infection and should not be used as the sole basis for treatment or other patient management decisions. A negative result may occur with  improper specimen collection/handling, submission of specimen other than nasopharyngeal swab, presence of viral mutation(s) within the areas targeted by this assay, and inadequate number of viral copies(<138 copies/mL). A negative result must be combined with clinical observations, patient history, and epidemiological information. The expected result is Negative.  Fact Sheet for Patients:  EntrepreneurPulse.com.au  Fact Sheet for Healthcare Providers:  IncredibleEmployment.be  This test is no t  yet approved or cleared by the [place] and  has been authorized for detection and/or diagnosis of SARS-CoV-2 by FDA under an Emergency Use Authorization (EUA). This EUA will remain  in effect (meaning this test can be used) for the duration of the COVID-19 declaration under Section 564(b)(1) of the Act, 21 U.S.C.section 360bbb-3(b)(1), unless the authorization is terminated  or revoked sooner.       Influenza A by PCR NEGATIVE NEGATIVE Final   Influenza B by PCR NEGATIVE NEGATIVE Final    Comment: (NOTE) The Xpert Xpress SARS-CoV-2/FLU/RSV plus assay is intended as an aid in the diagnosis of influenza from Nasopharyngeal swab specimens and should not be used as a sole basis for treatment. Nasal washings and aspirates are unacceptable for Xpert Xpress SARS-CoV-2/FLU/RSV testing.  Fact Sheet for Patients: EntrepreneurPulse.com.au  Fact Sheet for Healthcare Providers: IncredibleEmployment.be  This test is not yet approved or cleared by the [place] FDA and has been authorized for detection and/or diagnosis of SARS-CoV-2 by FDA under an Emergency Use Authorization (EUA). This EUA will remain in effect (meaning this test can be used) for the duration of  the COVID-19 declaration under Section 564(b)(1) of the Act, 21 U.S.C. section 360bbb-3(b)(1), unless the authorization is terminated or revoked.  Performed at [facility], [address] [place], [place] [address]   MRSA PCR Screening     Status: None   Collection Time: 02/19/21  4:22 PM   Specimen: Nasopharyngeal  Result Value Ref Range Status   MRSA by PCR NEGATIVE NEGATIVE Final    Comment:        The GeneXpert MRSA Assay (FDA approved for NASAL specimens only), is one component of a comprehensive MRSA colonization surveillance program. It is not intended to diagnose MRSA infection nor to guide or monitor treatment for MRSA infections. Performed at [facility], [address] [place], [place] [address]      Time coordinating discharge: 35 minutes The [place] controlled substances registry was reviewed for this patient prior to filling the <5 days supply controlled substances script.      SIGNED:   Edwin Dada, MD  [facility] 02/21/2021, 8:34 PM

## 2021-02-21 NOTE — TOC Transition Note (Signed)
Transition of Care [facility]) - CM/SW Discharge Note   Patient Details  Name: Brenda Middleton MRN: [id number] Date of Birth: 07/01/1961  [facility] CM/SW Contact:  Margarito Liner, LCSW Phone Number: 02/21/2021, 4:12 PM   Clinical Narrative:  CSW met with patient. Sister at bedside. CSW introduced role and explained that PT recommendations would be discussed. Patient already has an outpatient PT appt scheduled for Thursday at [facility] but is wondering if she needs to cancel appt due to two spinal fractures she just learned about. MD is aware and will go talk with her. No further concerns. Patient has orders to discharge home today. CSW signing off.   Final next level of care: OP Rehab Barriers to Discharge: No Barriers Identified   Patient Goals and CMS Choice        Discharge Placement                    Patient and family notified of of transfer: 02/21/21  Discharge Plan and Services                                     Social Determinants of Health (SDOH) Interventions     Readmission Risk Interventions No flowsheet data found.

## 2021-02-21 NOTE — Progress Notes (Signed)
Per [facility] chart, last 25 mcg Fentanyl patch applied to the right shoulder om February 18, 2021. Per [facility] patch is to be replaced every 72 hours. This nurse fully assessed pt's skin to locate previous dose. No previous dose patch found. New 25 mcg fentanyl patch application to the left shoulder noted. Charge nurse notified.

## 2021-02-21 NOTE — Progress Notes (Signed)
Discharge Note: Reviewed discharge instructions with pt. Pt verbalized understanding. Pt d/ced with all personal belongings noted. IV cath removed. Staff wheeled pt out.  Pt transported to home by a family member.

## 2021-02-21 NOTE — Progress Notes (Signed)
Patient clinically stable post LN biopsy per DR Kathlene Cote, tolerated well with Versed 2 mg . Post procedure report given to Bet RN on 1A with plan reviewed. Denies complaints at this time.

## 2021-02-21 NOTE — Progress Notes (Signed)
Chart reviewed, consulted with Nsg. Pt is NPO for biopsy. Swallow eval hopefully later today.

## 2021-02-21 NOTE — Evaluation (Addendum)
Clinical/Bedside Swallow Evaluation Patient Details  Name: Brenda Middleton MRN: [id number] Date of Birth: 08-15-1961  Today's Date: 02/21/2021 Time: SLP Start Time (ACUTE ONLY): 1440 SLP Stop Time (ACUTE ONLY): 1540 SLP Time Calculation (min) (ACUTE ONLY): 60 min  Past Medical History:  Past Medical History:  Diagnosis Date  . Anxiety   . Benign neoplasm of cervix uteri   . Chronic hepatitis C without mention of hepatic coma   . Chronic low back pain 08/26/2014  . Constipation   . Depressive disorder   . Displacement of cervical intervertebral disc   . Hypertensive disorder   . Iron deficiency anemia due to chronic blood loss 09/12/2019  . Palpitations   . Prolapsed cervical intervertebral disc    Past Surgical History:  Past Surgical History:  Procedure Laterality Date  . CONIZATION CERVIX    . DILATION AND CURETTAGE OF UTERUS    . HAND SURGERY  2008,2015   Carpel Tunnel  . TONSILLECTOMY    . VULVECTOMY     HPI:  Pt is a 60 y.o. F with hx hep C, HTN, IDA, and substance use disorder in remission who presented with back pain. 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 Patient also with pathological L2 fracture with multiple bone lesions. Biopsy of 3 cm left supraclavicular lymph node performed under US guidance. 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   Assessment / Plan / Recommendation Clinical Impression  Pt appears to present w/ primary Pharyngeal Phase dysphagia w/ overt, clinical s/s of aspiration noted during po trials of all consistencies this session. Pt exhibited both immediate(w/ larger sips of thin  liquids via Cup) and delayed(w/ smaller sips, food trials) throat clearing w/ Cough during po trials of various consistencies. Of note, pt endorsed awareness of the Cough but stated "I'm fine". She consumed trials feeding self. Discussed and practiced aspiration precautions w/ pt; model of precautions; Handouts given. Strongly recommended following precautions including No Straws and Pills swallowed w/ a Puree(applesauce, yogurt). Sister agreed and will ensure follow through at home. Encouraged pt to have a MBSS - an objective assessment of her swallowing for better education, possible strategies, but she declined at this time. MD and NSG updated. ST services will be available for further f/u while pt is admitted. Pt and Sister agreed. Recommend f/u w/ objective swallow assessment(MBSS) Outpatient if pt desires. MD updated.  SLP Visit Diagnosis: Dysphagia, pharyngeal phase (R13.13) (cancer)    Aspiration Risk  Moderate aspiration risk;Risk for inadequate nutrition/hydration    Diet Recommendation  Regular diet w/ foods/meats cut well; moistened foods. Thin liquids VIA CUP in SMALL, SINGLE SIPS. Aspiration precautions. Oral care b/f meals.  Medication Administration: Whole meds with puree (for safer swallowing)    Other  Recommendations Recommended Consults:  (Dietician f/u; Oncology following; Palliative Care for [place]) Oral Care Recommendations: Oral care BID;Oral care before and after PO;Patient independent with oral care Other Recommendations:  (n/a currently)   Follow up Recommendations  (TBD)      Frequency and Duration min 2x/week  1 week       Prognosis Prognosis for Safe Diet Advancement: Guarded Barriers to Reach Goals: Time post onset;Severity of deficits;Behavior;Motivation  Swallow Study   General Date of Onset: 02/18/21 HPI: Pt is a 60 y.o. F with hx hep C, HTN, IDA, and substance use disorder in remission who presented with back pain. 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 Patient also with pathological L2 fracture with multiple bone lesions. Biopsy of 3 cm left supraclavicular lymph node performed under US guidance. 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 Type of Study: Bedside Swallow Evaluation Previous Swallow Assessment: none Diet Prior to this Study: NPO (currently for recent biopsy; regular diet at home per pt) Temperature Spikes Noted: No (wbc 9.7) Respiratory Status: Room air History of Recent Intubation: No Behavior/Cognition: Alert;Cooperative;Pleasant mood (Hoarse vocal quality) Oral Cavity Assessment: Within Functional Limits Oral Care Completed by SLP: Yes Oral Cavity - Dentition: Adequate natural dentition;Missing dentition (few molars) Vision: Functional for self-feeding Self-Feeding Abilities: Able to feed self Patient Positioning: Upright in bed (sat EOB) Baseline Vocal Quality: Hoarse;Low vocal intensity;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 nerve) Volitional Cough:  (Raspy) Volitional Swallow: Able to elicit    Oral/Motor/Sensory Function Overall Oral Motor/Sensory Function: Within functional limits (grossly for lingual/labial strength/tone/ROM; gag reflux -)   Ice Chips Ice chips: Not tested   Thin Liquid Thin Liquid: Impaired Presentation: Cup;Self Fed (9-10 trials total) Oral Phase Impairments:  (none) Pharyngeal  Phase Impairments: Cough - Immediate;Throat Clearing - Immediate (inconsistent but moreso w/ larger bolus sips) Other Comments: "I'm fine"    Nectar Thick Nectar Thick Liquid: Not tested   Honey Thick Honey Thick Liquid: Not  tested   Puree Puree: Impaired Presentation: Spoon;Self Fed (3-4 trials) Oral Phase Impairments:  (none) Pharyngeal Phase Impairments: Throat Clearing - Delayed (inconsistent)   Solid     Solid: Impaired Presentation: Self Fed (5 trials) Oral Phase Impairments:  (none) Pharyngeal Phase Impairments: Throat Clearing - Delayed (inconsistent)       Orinda Kenner, MS, CCC-SLP Speech Language Pathologist Rehab Services [phone] [facility] 02/21/2021,6:22 PM

## 2021-02-21 NOTE — Consult Note (Addendum)
Chief Complaint: Patient was seen in consultation today for image guided left supraclavicular lymph node biopsy Chief Complaint  Patient presents with  . Back Pain   at the request of  Dr. Francine Graven, T.  Referring Physician(s): Dr. Francine Graven, T.  Supervising Physician: Aletta Edouard  Patient Status: [place] - In-pt  History of Present Illness: Brenda Middleton is a 60 y.o. female new to our service.  Her past medical history includes chronic hepatitis C, iron deficiency anemia, HTN, and substance use disorder.  Patient was recently evaluated for back pain and headache, MRI of brain showed destructive mass in the occipital bone, bilaterally extending into the posterior fossa with mass-effect in the left cerebellum. She underwent subsequent CT CAP which showed extensive metastatic carcinoma likely emanating from a 2.7 cm right middle lobe pulmonary nodule, metastases include extensive lymphadenopathy in the chest, pulmonary nodule, bone lesions and liver lesion.  IR was requested for image guided biopsy of lymph node.   Patient laying in bed, not in acute distress.  Reports chronic headache and back pain. Denise, fever, chills, shortness of breath, cough, chest pain, abdominal pain, nausea ,vomiting, and bleeding.  Past Medical History:  Diagnosis Date  . Anxiety   . Benign neoplasm of cervix uteri   . Chronic hepatitis C without mention of hepatic coma   . Chronic low back pain 08/26/2014  . Constipation   . Depressive disorder   . Displacement of cervical intervertebral disc   . Hypertensive disorder   . Iron deficiency anemia due to chronic blood loss 09/12/2019  . Palpitations   . Prolapsed cervical intervertebral disc     Past Surgical History:  Procedure Laterality Date  . CONIZATION CERVIX    . DILATION AND CURETTAGE OF UTERUS    . HAND SURGERY  2008,2015   Carpel Tunnel  . TONSILLECTOMY    . VULVECTOMY      Allergies: Penicillin g, Penicillin v, and  Penicillins  Medications: Prior to Admission medications   Medication Sig Start Date End Date Taking? Authorizing Provider  albuterol (PROVENTIL HFA;VENTOLIN HFA) 108 (90 BASE) MCG/ACT inhaler Inhale 2 puffs into the lungs every 4 (four) hours as needed for wheezing or shortness of breath.   Yes [provider]  budesonide-formoterol (SYMBICORT) 80-4.5 MCG/ACT inhaler Inhale 2 puffs into the lungs 2 (two) times daily.   Yes [provider]  busPIRone (BUSPAR) 10 MG tablet Take 10 mg by mouth 2 (two) times daily. Patient is unsure of dose   Yes [provider]  furosemide (LASIX) 20 MG tablet Take 20 mg by mouth 2 (two) times daily. 01/28/21  Yes [provider]  gabapentin (NEURONTIN) 300 MG capsule Take 300 mg by mouth daily. 02/04/21  Yes [provider]  hydrOXYzine (ATARAX/VISTARIL) 25 MG tablet hydroxyzine HCl 25 mg tablet   Yes [provider]  ketorolac (TORADOL) 10 MG tablet Take 1 tablet (10 mg total) by mouth every 6 (six) hours as needed for moderate pain or severe pain. 02/13/21  Yes Cook, Jayce G, DO  Meloxicam 10 MG CAPS Take 1 capsule by mouth daily as needed.   Yes [provider]  ondansetron (ZOFRAN-ODT) 4 MG disintegrating tablet Take 1 tablet (4 mg total) by mouth every 8 (eight) hours as needed. 02/15/21  Yes Fisher, Linden Dolin, PA-C  oxyCODONE-acetaminophen (PERCOCET) 5-325 MG tablet Take 1 tablet by mouth every 4 (four) hours as needed for severe pain. 02/15/21 02/15/22 Yes Fisher, Linden Dolin, PA-C  pantoprazole (PROTONIX)  40 MG tablet Take 40 mg by mouth daily. 02/08/21  Yes [provider]  prochlorperazine (COMPAZINE) 10 MG tablet Take 10 mg by mouth every 6 (six) hours as needed for nausea or vomiting.   Yes [provider]  promethazine (PHENERGAN) 12.5 MG tablet Take 1 tablet (12.5 mg total) by mouth every 6 (six) hours as needed for nausea or vomiting. 02/17/21  Yes Blake Divine, MD  QUEtiapine  (SEROQUEL) 400 MG tablet Take 400 mg by mouth at bedtime.   Yes [provider]  doxycycline (VIBRAMYCIN) 100 MG capsule Take 1 capsule (100 mg total) by mouth 2 (two) times daily. Patient not taking: No sig reported 02/12/21   Coral Spikes, DO  amitriptyline (ELAVIL) 25 MG tablet Take 2 tablets (50 mg total) by mouth at bedtime. Patient not taking: No sig reported 08/26/14 01/01/21  Kathrynn Ducking, MD  clonazePAM (KLONOPIN) 0.5 MG tablet Take 0.5 mg by mouth 2 (two) times daily.  01/01/21  [provider]  temazepam (RESTORIL) 30 MG capsule Take 30 mg by mouth at bedtime.  01/01/21  [provider]     Family History  Problem Relation Age of Onset  . Breast cancer Mother 12  . Lung cancer Father   . Cancer Brother   . Cancer Other   . Stroke Other     Social History   Socioeconomic History  . Marital status: Single    Spouse name: Not on file  . Number of children: 0  . Years of education: college1  . Highest education level: Not on file  Occupational History    Employer: UNEMPLOYED  Tobacco Use  . Smoking status: Former Smoker    Packs/day: 0.70    Years: 35.00    Pack years: 24.50    Types: Cigarettes  . Smokeless tobacco: Never Used  Vaping Use  . Vaping Use: Never used  Substance and Sexual Activity  . Alcohol use: Yes    Comment: occasional wine  . Drug use: No  . Sexual activity: Not Currently  Other Topics Concern  . Not on file  Social History Narrative  . Not on file   Social Determinants of Health   Financial Resource Strain: Not on file  Food Insecurity: Not on file  Transportation Needs: Not on file  Physical Activity: Not on file  Stress: Not on file  Social Connections: Not on file     Review of Systems: A 12 point ROS discussed and pertinent positives are indicated in the HPI above.  All other systems are negative.   Vital Signs: BP 125/76 (BP Location: Left Arm)   Pulse 75   Temp 98.2 F (36.8 C)   Resp 15    Ht 5\' 6"  (1.676 m)   Wt 114 lb 3.2 oz (51.8 kg)   SpO2 96%   BMI 18.43 kg/m   Physical Exam Vitals and nursing note reviewed.  Constitutional:      Appearance: Normal appearance.  Cardiovascular:     Rate and Rhythm: Normal rate and regular rhythm.     Pulses: Normal pulses.     Heart sounds: Normal heart sounds.  Pulmonary:     Effort: Pulmonary effort is normal. No respiratory distress.     Breath sounds: Wheezing present.     Comments: Bilateral heezing  Neurological:     Mental Status: She is alert and oriented to person, place, and time.  Psychiatric:        Mood and Affect:  Mood normal.        Behavior: Behavior normal.     MD Evaluation Airway: WNL Heart: WNL Abdomen: WNL Chest/ Lungs: WNL ASA  Classification: 3 Mallampati/Airway Score: Two  Imaging: DG Chest 2 View  Result Date: 02/15/2021 CLINICAL DATA:  Weight loss and cough EXAM: CHEST - 2 VIEW COMPARISON:  None. FINDINGS: The heart size and mediastinal contours are within normal limits. Rounded patchy nodular opacity seen within the right upper lobe and right mid lung. The left lung is clear. No acute osseous abnormality. IMPRESSION: Rounded patchy airspace opacities in the right mid lung which may be due to infectious etiology, however cannot exclude metastatic disease. Would recommend CT chest with contrast for further evaluation. Electronically Signed   By: Prudencio Pair M.D.   On: 02/15/2021 12:16   CT Head Wo Contrast  Result Date: 02/15/2021 CLINICAL DATA:  Headache with left-sided face/neck/head pain. On antibiotics for reported laryngitis with bilateral ear pain. EXAM: CT HEAD WITHOUT CONTRAST CT MAXILLOFACIAL WITHOUT CONTRAST TECHNIQUE: Multidetector CT imaging of the head and maxillofacial structures were performed using the standard protocol without intravenous contrast. Multiplanar CT image reconstructions of the maxillofacial structures were also generated. COMPARISON:  None. FINDINGS: CT HEAD FINDINGS  Brain: The calvarial/skull base mass lesion is described below. Spur this process exerts mass effect on the left cerebellum with suspected narrowing of the fourth ventricle. No evidence of hydrocephalus. No evidence of acute large vascular territory infarct. No acute hemorrhage. Mild to moderate scattered white matter hypodensities, which are nonspecific but most likely relate to chronic microvascular ischemic disease. No midline shift. Vascular: No hyperdense vessel identified. Calcific atherosclerosis. Skull/skull base: There is aggressive soft tissue mass lesion involving the left occipital calvarium measuring up to approximately 5.5 x 3.1 by 3.3 cm (transverse by AP by craniocaudal) with bony destruction and expansion and extension inferiorly to involve the left temporal bone and skull base. There is mass effect on the inferior left cerebellum and possible involvement of the subjacent dural venous sinuses. Small left mastoid effusion. CT MAXILLOFACIAL FINDINGS Osseous: No fracture or mandibular dislocation. No destructive process in the face. Orbits: Negative. No traumatic or inflammatory finding. Sinuses: Small right maxillary sinus retention cyst. Otherwise, sinuses are clear. Soft tissues: Negative. 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 Resulting mass effect on the left cerebellum and possible involvement of the subjacent dural venous sinuses. 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 Osteomyelitis is a differential consideration. Recommend MRI brain with and without contrast to further evaluate. Findings and recommendations discussed with Ashok Cordia, PA via telephone at 11:20 a.m. Electronically Signed   By: Margaretha Sheffield MD   On: 02/15/2021 11:29   CT Thoracic Spine Wo Contrast  Result Date:  02/18/2021 CLINICAL DATA:  Metastases on CT abdomen EXAM: CT THORACIC AND LUMBAR SPINE WITHOUT CONTRAST TECHNIQUE: Multidetector CT imaging of the thoracic and lumbar spine was performed without contrast. Multiplanar CT image reconstructions were also generated. COMPARISON:  None. FINDINGS: CT THORACIC SPINE FINDINGS Alignment: Preserved. Vertebrae: There is a small partially sclerotic lesion at the right aspect of the T5 vertebral body. A lytic lesion is present at the anterior aspect T9 likely with pathologic fracture but no substantial height loss. Ill-defined lucent lesions are present elsewhere. Paraspinal and other soft tissues: Better evaluated on prior dedicated imaging. Disc levels: Multilevel degenerative  changes. No high-grade degenerative canal narrowing. CT LUMBAR SPINE FINDINGS Segmentation: 5 lumbar type vertebrae. Alignment: Dextrocurvature.  Grade 1 anterolisthesis at L4-L5 Vertebrae: Lucency and sclerosis at L2 with pathologic compression fracture resulting in less than 50% loss of height at the superior endplate. Sclerotic lesion of the right sacrum. Paraspinal and other soft tissues: Better evaluated on prior dedicated imaging. Disc levels: Multilevel degenerative changes. Canal stenosis is greatest at L3-L4 and L4-L5. Foraminal narrowing is greatest on the right at L5-S1. IMPRESSION: Sclerotic and lytic metastatic lesions as already identified on the CT chest, abdomen, and pelvis. Pathologic L2 fracture with less than 50% loss of height. Probable pathologic fracture at T9 without height loss. No apparent significant epidural disease by CT. Electronically Signed   By: Macy Mis M.D.   On: 02/18/2021 10:53   CT Lumbar Spine Wo Contrast  Result Date: 02/18/2021 CLINICAL DATA:  Metastases on CT abdomen EXAM: CT THORACIC AND LUMBAR SPINE WITHOUT CONTRAST TECHNIQUE: Multidetector CT imaging of the thoracic and lumbar spine was performed without contrast. Multiplanar CT image reconstructions  were also generated. COMPARISON:  None. FINDINGS: CT THORACIC SPINE FINDINGS Alignment: Preserved. Vertebrae: There is a small partially sclerotic lesion at the right aspect of the T5 vertebral body. A lytic lesion is present at the anterior aspect T9 likely with pathologic fracture but no substantial height loss. Ill-defined lucent lesions are present elsewhere. Paraspinal and other soft tissues: Better evaluated on prior dedicated imaging. Disc levels: Multilevel degenerative changes. No high-grade degenerative canal narrowing. CT LUMBAR SPINE FINDINGS Segmentation: 5 lumbar type vertebrae. Alignment: Dextrocurvature.  Grade 1 anterolisthesis at L4-L5 Vertebrae: Lucency and sclerosis at L2 with pathologic compression fracture resulting in less than 50% loss of height at the superior endplate. Sclerotic lesion of the right sacrum. Paraspinal and other soft tissues: Better evaluated on prior dedicated imaging. Disc levels: Multilevel degenerative changes. Canal stenosis is greatest at L3-L4 and L4-L5. Foraminal narrowing is greatest on the right at L5-S1. IMPRESSION: Sclerotic and lytic metastatic lesions as already identified on the CT chest, abdomen, and pelvis. Pathologic L2 fracture with less than 50% loss of height. Probable pathologic fracture at T9 without height loss. No apparent significant epidural disease by CT. Electronically Signed   By: Macy Mis M.D.   On: 02/18/2021 10:53   MR Brain W and Wo Contrast  Result Date: 02/15/2021 CLINICAL DATA:  Abnormal head CT.  Skull base mass. EXAM: MRI HEAD WITHOUT AND WITH CONTRAST TECHNIQUE: Multiplanar, multiecho pulse sequences of the brain and surrounding structures were obtained without and with intravenous contrast. CONTRAST:  41mL GADAVIST GADOBUTROL 1 MMOL/ML IV SOLN COMPARISON:  CT head 02/15/2021 FINDINGS: Brain: Large destructive mass in the posterior skull base bilaterally left greater than right. This involves the occipital bone with extension  into the left temporal bone. The occipital bone is significantly expanded due to tumor on the left with mass-effect on the left cerebellum. Soft tissue mass associated with left occipital lesion measures approximately 5.2 x 3.0 cm. Mild edema in the left cerebellum. There is infiltrating tumor in the left temporal bone which is more sizable than would be predicted from the CT. The soft tissue mass associated with the left occipital bone shows susceptibility which may be due to mineralization and/or bone fragments from bone destruction. Ventricle size normal. No midline shift. Moderate white matter changes likely due to chronic microvascular ischemia. No acute infarct. Chronic microhemorrhage in the left parietal lobe. Subtle enhancing lesions in the brain in the right frontal  lobe measuring approximately 10 mm, and 4 mm best seen on coronal and sagittal postcontrast imaging. This is suspicious for metastatic disease. Vascular: Normal arterial flow voids. Normal venous enhancement. No evidence of venous sinus thrombosis. Skull and upper cervical spine: Infiltrating destructive mass in the occipital bone bilaterally left greater than right with extension into the left temporal bone. Additional lesions in the frontal bone bilaterally and in the left parietal bone likely metastatic disease. Sinuses/Orbits: Mild mucosal edema paranasal sinuses. Negative orbit Other: None IMPRESSION: Destructive mass lesion in the occipital bone bilaterally left greater than right. There is associated soft tissue mass in the left occipital bone extending into the posterior fossa with mass-effect and mild edema of the left cerebellum. This mass extends into the left temporal bone. Additional bone lesions are present in the frontal bones and left parietal bone. Subtle enhancing lesions right frontal lobe measuring 10 mm and 4 mm. Findings compatible with metastatic disease. Electronically Signed   By: Franchot Gallo M.D.   On: 02/15/2021  13:47   US Renal  Result Date: 02/18/2021 CLINICAL DATA:  Back pain. Right renal pelvis fullness on right upper quadrant ultrasound performed 1 day prior EXAM: RENAL / URINARY TRACT ULTRASOUND COMPLETE COMPARISON:  02/17/2021 abdominal sonogram. FINDINGS: Right Kidney: Renal measurements: 10.3 x 4.2 x 4.2 cm = volume: 95 mL. Normal parenchymal echogenicity and thickness. Mild right hydronephrosis. Possible 5 mm right ureteropelvic junction stone. Nonobstructing 3 mm lower right renal stone. No renal masses. Left Kidney: Renal measurements: 10.0 x 4.8 x 4.2 cm = volume: 107 mL. Normal parenchymal echogenicity and thickness. No left hydronephrosis. No renal masses. Probable nonobstructing 7 mm upper left renal stone. Bladder: Appears normal for degree of bladder distention. Bilateral ureteral jets seen in the bladder. Other: None. IMPRESSION: 1. Mild right hydronephrosis. Possible 5 mm right UPJ stone. Nonobstructing 3 mm lower right renal stone. Suggest unenhanced CT abdomen/pelvis for further evaluation. 2. Probable nonobstructing upper left renal stone. Electronically Signed   By: Ilona Sorrel M.D.   On: 02/18/2021 12:02   CT CHEST ABDOMEN PELVIS W CONTRAST  Result Date: 02/15/2021 CLINICAL DATA:  Altered mental status. 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 EXAM: CT CHEST, ABDOMEN, AND PELVIS WITH CONTRAST TECHNIQUE: Multidetector CT imaging of the chest, abdomen and pelvis was performed following the standard protocol during bolus administration of intravenous contrast. CONTRAST:  75 mL OMNIPAQUE IOHEXOL 300 MG/ML  SOLN COMPARISON:  None. FINDINGS: CT CHEST FINDINGS Cardiovascular: No significant vascular findings. Normal heart size. No pericardial effusion. Mediastinum/Nodes: The patient has extensive lymphadenopathy. A 2 cm left axillary node is seen on image 11 of series 2; a second left axillary node on image 14 measures 1.4 cm. A 1.1 cm subpectoral node is seen on the left on  image 15 of series 2. A 1.4 cm left supraclavicular node is seen on image 9. Right paratracheal node on image 15 measures 1.9 cm. Right precarinal nodal mass on image 25 measures 2.6 cm and there is a right hilar nodal mass measuring 4.2 x 3.1 cm on image 30. The esophagus and thyroid are negative. Lungs/Pleura: Small right pleural effusion. A spiculated nodule in the right middle lobe measures 2.6 cm transverse by 2.7 cm AP on image 77, series 4. Scattered pulmonary nodules include a 0.5 cm nodule in the superior segment of the left lower lobe on image 80, 0.4 cm left upper lobe nodule on image 76 and 2 punctate nodules on image 83. Additional  smaller nodules are seen scattered through both lungs. The lungs are emphysematous. No pleural effusion. Musculoskeletal: A 0.9 cm lytic lesion is seen in T5, lytic lesions are seen in T7 and T9, larger in T9. There is also a small lytic lesion in T10. CT ABDOMEN PELVIS FINDINGS Hepatobiliary: Metastatic lesions are seen in the left hepatic lobe measuring 1.7 cm on image 57, series 2 and near the dome of the liver on image 50 of series 2 where a 1.1 cm lesion is identified. A second lesion near the dome of the liver in the right hepatic lobe on image 50 measures 0.7 cm. There is mild intra and extrahepatic biliary ductal dilatation likely related to prior cholecystectomy. Pancreas: Unremarkable. No pancreatic ductal dilatation or surrounding inflammatory changes. Spleen: Normal in size without focal abnormality. Adrenals/Urinary Tract: Adrenal glands are unremarkable. Kidneys are normal, without renal calculi, focal lesion, or hydronephrosis. Bladder is unremarkable. Stomach/Bowel: Stomach is within normal limits. Appendix appears normal. No evidence of bowel wall thickening, distention, or inflammatory changes. Moderately large colonic stool burden throughout noted. Vascular/Lymphatic: Aortic atherosclerosis. No aneurysm. No pathologic lymphadenopathy by CT size criteria.  Reproductive: Status post hysterectomy. No adnexal masses. Other: None. Musculoskeletal: Mottled appearance of the L2 vertebral body is consistent with metastatic disease. There is a mild pathologic superior endplate compression fracture of L2 with vertebral body height loss centrally of approximately 30%. IMPRESSION: Findings consistent with extensive metastatic carcinoma likely emanating from a 2.7 cm right middle lobe pulmonary nodule. Metastases include extensive lymphadenopathy in the chest,, pulmonary nodule bone lesions and liver lesions. Mild pathologic fracture of L2 with vertebral body height loss centrally of up to approximately 30% is again noted. Aortic Atherosclerosis (ICD10-I70.0) and Emphysema (ICD10-J43.9). Electronically Signed   By: Inge Rise M.D.   On: 02/15/2021 14:25   CT Maxillofacial Wo Contrast  Result Date: 02/15/2021 CLINICAL DATA:  Headache with left-sided face/neck/head pain. On antibiotics for reported laryngitis with bilateral ear pain. EXAM: CT HEAD WITHOUT CONTRAST CT MAXILLOFACIAL WITHOUT CONTRAST TECHNIQUE: Multidetector CT imaging of the head and maxillofacial structures were performed using the standard protocol without intravenous contrast. Multiplanar CT image reconstructions of the maxillofacial structures were also generated. COMPARISON:  None. FINDINGS: CT HEAD FINDINGS Brain: The calvarial/skull base mass lesion is described below. Spur this process exerts mass effect on the left cerebellum with suspected narrowing of the fourth ventricle. No evidence of hydrocephalus. No evidence of acute large vascular territory infarct. No acute hemorrhage. Mild to moderate scattered white matter hypodensities, which are nonspecific but most likely relate to chronic microvascular ischemic disease. No midline shift. Vascular: No hyperdense vessel identified. Calcific atherosclerosis. Skull/skull base: There is aggressive soft tissue mass lesion involving the left occipital  calvarium measuring up to approximately 5.5 x 3.1 by 3.3 cm (transverse by AP by craniocaudal) with bony destruction and expansion and extension inferiorly to involve the left temporal bone and skull base. There is mass effect on the inferior left cerebellum and possible involvement of the subjacent dural venous sinuses. Small left mastoid effusion. CT MAXILLOFACIAL FINDINGS Osseous: No fracture or mandibular dislocation. No destructive process in the face. Orbits: Negative. No traumatic or inflammatory finding. Sinuses: Small right maxillary sinus retention cyst. Otherwise, sinuses are clear. Soft tissues: Negative. 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 Resulting mass effect on the left cerebellum and possible involvement of the subjacent dural venous  sinuses. 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 Osteomyelitis is a differential consideration. Recommend MRI brain with and without contrast to further evaluate. Findings and recommendations discussed with Ashok Cordia, PA via telephone at 11:20 a.m. Electronically Signed   By: Margaretha Sheffield MD   On: 02/15/2021 11:29   US Abdomen Limited RUQ (LIVER/GB)  Result Date: 02/17/2021 CLINICAL DATA:  Nausea and vomiting for 1 day. EXAM: ULTRASOUND ABDOMEN LIMITED RIGHT UPPER QUADRANT COMPARISON:  CT chest, abdomen and pelvis 02/15/2021. FINDINGS: Gallbladder: Removed. Common bile duct: Diameter: 0.8 cm. Liver: Three hypoechoic liver lesions measuring up to 1.8 cm are identified are identified and correlate with metastatic deposit seen on prior CT. Mild intrahepatic biliary ductal dilatation seen on CT is also noted. Portal vein is patent on color Doppler imaging with normal direction of blood flow towards the liver. Other: There is fullness of the right renal pelvis  which is new since the patient's CT scan yesterday. IMPRESSION: Three hypoechoic liver lesions measuring up to 1.8 cm correlate with metastatic deposit seen on CT. Status post cholecystectomy. Mild intra and extrahepatic biliary ductal dilatation is likely related to cholecystectomy. No obstructing lesion is identified. Fullness of the right renal pelvis is new since yesterday's examination. This could be incidental. Correlation with dedicated renal ultrasound could be used for further evaluation if indicated. Electronically Signed   By: Inge Rise M.D.   On: 02/17/2021 12:53    Labs:  CBC: Recent Labs    02/15/21 1042 02/17/21 0821 02/18/21 0934  WBC 12.1* 11.9* 9.7  HGB 11.3* 13.2 11.7*  HCT 34.2* 38.5 34.7*  PLT 129* 159 156    COAGS: No results for input(s): INR, APTT in the last 8760 hours.  BMP: Recent Labs    02/15/21 1042 02/17/21 0821 02/18/21 0934 02/21/21 0418  NA 142 137 137 137  K 4.0 3.7 4.3 4.0  CL 108 104 101 104  CO2 27 24 28 24   GLUCOSE 97 100* 118* 157*  BUN 28* 21* 18 17  CALCIUM 8.9 8.5* 8.9 9.0  CREATININE 0.91 0.72 0.82 0.77  GFRNONAA >60 >60 >60 >60    LIVER FUNCTION TESTS: Recent Labs    02/15/21 1042 02/17/21 0821 02/18/21 0934 02/21/21 0418  BILITOT 0.5 1.2 0.9 0.6  AST 32 84* 47* 22  ALT 35 106* 77* 37  ALKPHOS 224* 432* 447* 379*  PROT 6.7 6.2* 6.5 6.5  ALBUMIN 3.3* 3.2* 3.2* 3.2*    TUMOR MARKERS: No results for input(s): AFPTM, CEA, CA199, CHROMGRNA in the last 8760 hours.  Assessment and Plan: 60 y.o. 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  IR was requested for biopsy of lymph node. Case was reviewed and approved for left supraclavicular lymph node biopsy by Dr. Anselm Pancoast.  Patient presents to IR today for the procedure.  NPO since midnight  Last Lovenox at 9 pm last night   Risks and benefits of lymh node biopsy was discussed with the patient and/or  patient's family including, but not limited to bleeding, infection, damage to adjacent structures or low yield requiring additional tests.  All of the questions were answered and there is agreement to proceed.  Consent signed and in chart.  Thank you for this interesting consult.  I greatly enjoyed meeting Brenda Middleton and look forward to participating in their care.  A copy of this report was sent to the requesting provider on this date.  Electronically Signed: Tera Mater, PA-C 02/21/2021, 10:21 AM   I spent a total of 20 Minutes   in face to face in clinical consultation, greater than 50% of which was counseling/coordinating care for image guided left supraclavicular lymph node biopsy.

## 2021-02-21 NOTE — Procedures (Signed)
Interventional Radiology Procedure Note  Procedure: US Guided Biopsy of left supraclavicular lymph node  Complications: None  Estimated Blood Loss: < 10 mL  Findings: 18 G core biopsy of 3 cm left supraclavicular lymph node performed under US guidance.  Five core samples obtained and sent to Pathology.  Venetia Night. Kathlene Cote, M.D Pager:  [phone]

## 2021-02-21 NOTE — Progress Notes (Signed)
Hematology/Oncology Progress Note [facility] Telephone:([phone] [phone]  Patient Care Team: Remi Haggard, FNP as PCP - General (Family Medicine) Remi Haggard, FNP (Family Medicine) Christene Lye, MD (General Surgery) Telford Nab, RN as Oncology Nurse Navigator   Name of the patient: Brenda Middleton  [id number]  1961/11/07  Date of visit: 02/21/21   INTERVAL HISTORY-  Awaiting IR biopsy.  Report pain is better.  Mainly back of her neck.  5 out of 10.  On fentanyl patch and oxycodone 7.5 mg every 4 hours as needed.  Sister [name] is at the bedside.    Review of systems- Review of Systems  Constitutional: Positive for appetite change and fatigue.  Respiratory: Negative for shortness of breath.   Gastrointestinal: Negative for abdominal pain and nausea.  Genitourinary: Negative for dysuria.   Musculoskeletal: Positive for back pain and neck pain.  Hematological: Negative for adenopathy.  Psychiatric/Behavioral: Negative for confusion.    Allergies  Allergen Reactions  . Penicillin G Hives    Other reaction(s): HIVES Other reaction(s): HIVES  . Penicillin V Itching  . Penicillins     Patient Active Problem List   Diagnosis Date Noted  . Intractable low back pain 02/18/2021  . Anxiety   . Depressive disorder   . Pathologic fracture of lumbar vertebra, initial encounter   . 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 COPD (chronic obstructive pulmonary disease) ([place])   . GERD (gastroesophageal reflux disease)   . Palliative care encounter   . Pathologic compression fracture of lumbar vertebra ([place])   . Goals of care, counseling/discussion   . Neoplasm related pain   . Iron deficiency anemia due to chronic blood loss 09/12/2019  . Family history of cancer 09/12/2019  . Blood in the stool 09/12/2019  . Elevated alkaline phosphatase level 09/12/2019  . Chronic low back pain 08/26/2014     Past Medical History:   Diagnosis Date  . Anxiety   . Benign neoplasm of cervix uteri   . Chronic hepatitis C without mention of hepatic coma   . Chronic low back pain 08/26/2014  . Constipation   . Depressive disorder   . Displacement of cervical intervertebral disc   . Hypertensive disorder   . Iron deficiency anemia due to chronic blood loss 09/12/2019  . Palpitations   . Prolapsed cervical intervertebral disc      Past Surgical History:  Procedure Laterality Date  . CONIZATION CERVIX    . DILATION AND CURETTAGE OF UTERUS    . HAND SURGERY  2008,2015   Carpel Tunnel  . TONSILLECTOMY    . VULVECTOMY      Social History   Socioeconomic History  . Marital status: Single    Spouse name: Not on file  . Number of children: 0  . Years of education: college1  . Highest education level: Not on file  Occupational History    Employer: UNEMPLOYED  Tobacco Use  . Smoking status: Former Smoker    Packs/day: 0.70    Years: 35.00    Pack years: 24.50    Types: Cigarettes  . Smokeless tobacco: Never Used  Vaping Use  . Vaping Use: Never used  Substance and Sexual Activity  . Alcohol use: Yes    Comment: occasional wine  . Drug use: No  . Sexual activity: Not Currently  Other Topics Concern  . Not on file  Social History Narrative  . Not on file   Social  Determinants of Health   Financial Resource Strain: Not on file  Food Insecurity: Not on file  Transportation Needs: Not on file  Physical Activity: Not on file  Stress: Not on file  Social Connections: Not on file  Intimate Partner Violence: Not on file     Family History  Problem Relation Age of Onset  . Breast cancer Mother 60  . Lung cancer Father   . Cancer Brother   . Cancer Other   . Stroke Other      Current Facility-Administered Medications:  .  albuterol (VENTOLIN HFA) 108 (90 Base) MCG/ACT inhaler 2 puff, 2 puff, Inhalation, Q4H PRN, Agbata, Tochukwu, MD .  amLODipine (NORVASC) tablet 5 mg, 5 mg, Oral, Daily, Agbata,  Tochukwu, MD, 5 mg at 02/20/21 0849 .  busPIRone (BUSPAR) tablet 10 mg, 10 mg, Oral, BID, Agbata, Tochukwu, MD, 10 mg at 02/20/21 2130 .  dexamethasone (DECADRON) tablet 8 mg, 8 mg, Oral, Q12H, Earlie Server, MD, 8 mg at 02/20/21 2129 .  enoxaparin (LOVENOX) injection 40 mg, 40 mg, Subcutaneous, Q24H, Agbata, Tochukwu, MD, 40 mg at 02/20/21 2131 .  feeding supplement (ENSURE ENLIVE / ENSURE PLUS) liquid 237 mL, 237 mL, Oral, TID BM, Danford, Suann Larry, MD, 237 mL at 02/20/21 1335 .  fentaNYL (DURAGESIC) 25 MCG/HR 1 patch, 1 patch, Transdermal, Q72H, Borders, Kirt Boys, NP, 1 patch at 02/18/21 2025 .  gabapentin (NEURONTIN) capsule 300 mg, 300 mg, Oral, Daily, Agbata, Tochukwu, MD, 300 mg at 02/20/21 0848 .  hydrochlorothiazide (HYDRODIURIL) tablet 25 mg, 25 mg, Oral, Daily, Danford, Suann Larry, MD, 25 mg at 02/20/21 0850 .  HYDROmorphone (DILAUDID) injection 1 mg, 1 mg, Intravenous, Q4H PRN, Agbata, Tochukwu, MD, 1 mg at 02/18/21 2024 .  hydrOXYzine (ATARAX/VISTARIL) tablet 25 mg, 25 mg, Oral, Q6H PRN, Agbata, Tochukwu, MD .  labetalol (NORMODYNE) tablet 200 mg, 200 mg, Oral, Q8H PRN, Danford, Suann Larry, MD, 200 mg at 02/20/21 1833 .  lidocaine (LIDODERM) 5 % 1 patch, 1 patch, Transdermal, Q24H, Lucrezia Starch, MD, 1 patch at 02/21/21 [phone] .  mometasone-formoterol (DULERA) 100-5 MCG/ACT inhaler 2 puff, 2 puff, Inhalation, BID, Agbata, Tochukwu, MD, 2 puff at 02/21/21 0953 .  ondansetron (ZOFRAN) tablet 4 mg, 4 mg, Oral, Q8H, 4 mg at 02/21/21 0610 **OR** [DISCONTINUED] ondansetron (ZOFRAN) injection 4 mg, 4 mg, Intravenous, Q6H PRN, Agbata, Tochukwu, MD, 4 mg at 02/18/21 1508 .  oxyCODONE (Oxy IR/ROXICODONE) immediate release tablet 7.5 mg, 7.5 mg, Oral, Q4H PRN, Danford, Suann Larry, MD, 7.5 mg at 02/21/21 1233 .  pantoprazole (PROTONIX) EC tablet 40 mg, 40 mg, Oral, Daily, Agbata, Tochukwu, MD, 40 mg at 02/20/21 0849 .  prochlorperazine (COMPAZINE) injection 10 mg, 10 mg, Intravenous, Q6H  PRN, Danford, Suann Larry, MD .  promethazine (PHENERGAN) 12.5 mg in sodium chloride 0.9 % 50 mL IVPB, 12.5 mg, Intravenous, Q6H PRN, Agbata, Tochukwu, MD, Last Rate: 200 mL/hr at 02/19/21 1652, 12.5 mg at 02/19/21 1652 .  QUEtiapine (SEROQUEL) tablet 400 mg, 400 mg, Oral, QHS, Agbata, Tochukwu, MD, 400 mg at 02/20/21 2128 .  senna (SENOKOT) tablet 8.6 mg, 1 tablet, Oral, BID, Agbata, Tochukwu, MD, 8.6 mg at 02/18/21 2312   Physical exam:  Vitals:   02/21/21 0350 02/21/21 0602 02/21/21 0745 02/21/21 1219  BP: 123/68  125/76 (!) 147/83  Pulse: 84  75 85  Resp: 17  15 15   Temp: 97.8 F (36.6 C)  98.2 F (36.8 C) 97.8 F (36.6 C)  TempSrc:  SpO2: 92%  96% 95%  Weight:  114 lb 3.2 oz (51.8 kg)    Height:       Physical Exam Constitutional:      General: She is not in acute distress.    Appearance: She is not diaphoretic.  HENT:     Head: Normocephalic and atraumatic.     Nose: Nose normal.     Mouth/Throat:     Pharynx: No oropharyngeal exudate.  Eyes:     Pupils: Pupils are equal, round, and reactive to light.  Cardiovascular:     Rate and Rhythm: Normal rate and regular rhythm.     Heart sounds: No murmur heard.   Pulmonary:     Effort: Pulmonary effort is normal. No respiratory distress.     Breath sounds: No rales.     Comments: Decreased breath sound bilaterally Chest:     Chest wall: No tenderness.  Abdominal:     General: There is no distension.     Palpations: Abdomen is soft.     Tenderness: There is no abdominal tenderness.  Musculoskeletal:        General: Normal range of motion.     Cervical back: Normal range of motion and neck supple.  Skin:    General: Skin is warm and dry.     Findings: No erythema.  Neurological:     Mental Status: She is alert and oriented to person, place, and time.     Motor: No abnormal muscle tone.  Psychiatric:        Mood and Affect: Affect normal.        CMP Latest Ref Rng & Units 02/21/2021  Glucose 70 - 99  mg/dL 157(H)  BUN 6 - 20 mg/dL 17  Creatinine 0.44 - 1.00 mg/dL 0.77  Sodium 135 - 145 mmol/L 137  Potassium 3.5 - 5.1 mmol/L 4.0  Chloride 98 - 111 mmol/L 104  CO2 22 - 32 mmol/L 24  Calcium 8.9 - 10.3 mg/dL 9.0  Total Protein 6.5 - 8.1 g/dL 6.5  Total Bilirubin 0.3 - 1.2 mg/dL 0.6  Alkaline Phos 38 - 126 U/L 379(H)  AST 15 - 41 U/L 22  ALT 0 - 44 U/L 37   CBC Latest Ref Rng & Units 02/18/2021  WBC 4.0 - 10.5 K/uL 9.7  Hemoglobin 12.0 - 15.0 g/dL 11.7(L)  Hematocrit 36.0 - 46.0 % 34.7(L)  Platelets 150 - 400 K/uL 156    RADIOGRAPHIC STUDIES: I have personally reviewed the radiological images as listed and agreed with the findings in the report. DG Chest 2 View  Result Date: 02/15/2021 CLINICAL DATA:  Weight loss and cough EXAM: CHEST - 2 VIEW COMPARISON:  None. FINDINGS: The heart size and mediastinal contours are within normal limits. Rounded patchy nodular opacity seen within the right upper lobe and right mid lung. The left lung is clear. No acute osseous abnormality. IMPRESSION: Rounded patchy airspace opacities in the right mid lung which may be due to infectious etiology, however cannot exclude metastatic disease. Would recommend CT chest with contrast for further evaluation. Electronically Signed   By: Prudencio Pair M.D.   On: 02/15/2021 12:16   CT Head Wo Contrast  Result Date: 02/15/2021 CLINICAL DATA:  Headache with left-sided face/neck/head pain. On antibiotics for reported laryngitis with bilateral ear pain. EXAM: CT HEAD WITHOUT CONTRAST CT MAXILLOFACIAL WITHOUT CONTRAST TECHNIQUE: Multidetector CT imaging of the head and maxillofacial structures were performed using the standard protocol without intravenous contrast. Multiplanar CT image reconstructions  of the maxillofacial structures were also generated. COMPARISON:  None. FINDINGS: CT HEAD FINDINGS Brain: The calvarial/skull base mass lesion is described below. Spur this process exerts mass effect on the left cerebellum  with suspected narrowing of the fourth ventricle. No evidence of hydrocephalus. No evidence of acute large vascular territory infarct. No acute hemorrhage. Mild to moderate scattered white matter hypodensities, which are nonspecific but most likely relate to chronic microvascular ischemic disease. No midline shift. Vascular: No hyperdense vessel identified. Calcific atherosclerosis. Skull/skull base: There is aggressive soft tissue mass lesion involving the left occipital calvarium measuring up to approximately 5.5 x 3.1 by 3.3 cm (transverse by AP by craniocaudal) with bony destruction and expansion and extension inferiorly to involve the left temporal bone and skull base. There is mass effect on the inferior left cerebellum and possible involvement of the subjacent dural venous sinuses. Small left mastoid effusion. CT MAXILLOFACIAL FINDINGS Osseous: No fracture or mandibular dislocation. No destructive process in the face. Orbits: Negative. No traumatic or inflammatory finding. Sinuses: Small right maxillary sinus retention cyst. Otherwise, sinuses are clear. Soft tissues: Negative. 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 Resulting mass effect on the left cerebellum and possible involvement of the subjacent dural venous sinuses. 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 Osteomyelitis is a differential consideration. Recommend MRI brain with and without contrast to further evaluate. Findings and recommendations discussed with Ashok Cordia, PA via telephone at 11:20 a.m. Electronically Signed   By: Margaretha Sheffield MD   On: 02/15/2021 11:29   CT Thoracic Spine Wo Contrast  Result Date: 02/18/2021 CLINICAL DATA:  Metastases on CT abdomen EXAM: CT THORACIC AND LUMBAR SPINE WITHOUT CONTRAST TECHNIQUE: Multidetector CT  imaging of the thoracic and lumbar spine was performed without contrast. Multiplanar CT image reconstructions were also generated. COMPARISON:  None. FINDINGS: CT THORACIC SPINE FINDINGS Alignment: Preserved. Vertebrae: There is a small partially sclerotic lesion at the right aspect of the T5 vertebral body. A lytic lesion is present at the anterior aspect T9 likely with pathologic fracture but no substantial height loss. Ill-defined lucent lesions are present elsewhere. Paraspinal and other soft tissues: Better evaluated on prior dedicated imaging. Disc levels: Multilevel degenerative changes. No high-grade degenerative canal narrowing. CT LUMBAR SPINE FINDINGS Segmentation: 5 lumbar type vertebrae. Alignment: Dextrocurvature.  Grade 1 anterolisthesis at L4-L5 Vertebrae: Lucency and sclerosis at L2 with pathologic compression fracture resulting in less than 50% loss of height at the superior endplate. Sclerotic lesion of the right sacrum. Paraspinal and other soft tissues: Better evaluated on prior dedicated imaging. Disc levels: Multilevel degenerative changes. Canal stenosis is greatest at L3-L4 and L4-L5. Foraminal narrowing is greatest on the right at L5-S1. IMPRESSION: Sclerotic and lytic metastatic lesions as already identified on the CT chest, abdomen, and pelvis. Pathologic L2 fracture with less than 50% loss of height. Probable pathologic fracture at T9 without height loss. No apparent significant epidural disease by CT. Electronically Signed   By: Macy Mis M.D.   On: 02/18/2021 10:53   CT Lumbar Spine Wo Contrast  Result Date: 02/18/2021 CLINICAL DATA:  Metastases on CT abdomen EXAM: CT THORACIC AND LUMBAR SPINE WITHOUT CONTRAST TECHNIQUE: Multidetector CT imaging of the thoracic and lumbar spine was performed without contrast. Multiplanar CT image reconstructions were also generated. COMPARISON:  None. FINDINGS: CT THORACIC SPINE FINDINGS Alignment: Preserved. Vertebrae:  There is a small  partially sclerotic lesion at the right aspect of the T5 vertebral body. A lytic lesion is present at the anterior aspect T9 likely with pathologic fracture but no substantial height loss. Ill-defined lucent lesions are present elsewhere. Paraspinal and other soft tissues: Better evaluated on prior dedicated imaging. Disc levels: Multilevel degenerative changes. No high-grade degenerative canal narrowing. CT LUMBAR SPINE FINDINGS Segmentation: 5 lumbar type vertebrae. Alignment: Dextrocurvature.  Grade 1 anterolisthesis at L4-L5 Vertebrae: Lucency and sclerosis at L2 with pathologic compression fracture resulting in less than 50% loss of height at the superior endplate. Sclerotic lesion of the right sacrum. Paraspinal and other soft tissues: Better evaluated on prior dedicated imaging. Disc levels: Multilevel degenerative changes. Canal stenosis is greatest at L3-L4 and L4-L5. Foraminal narrowing is greatest on the right at L5-S1. IMPRESSION: Sclerotic and lytic metastatic lesions as already identified on the CT chest, abdomen, and pelvis. Pathologic L2 fracture with less than 50% loss of height. Probable pathologic fracture at T9 without height loss. No apparent significant epidural disease by CT. Electronically Signed   By: Macy Mis M.D.   On: 02/18/2021 10:53   MR Brain W and Wo Contrast  Result Date: 02/15/2021 CLINICAL DATA:  Abnormal head CT.  Skull base mass. EXAM: MRI HEAD WITHOUT AND WITH CONTRAST TECHNIQUE: Multiplanar, multiecho pulse sequences of the brain and surrounding structures were obtained without and with intravenous contrast. CONTRAST:  33mL GADAVIST GADOBUTROL 1 MMOL/ML IV SOLN COMPARISON:  CT head 02/15/2021 FINDINGS: Brain: Large destructive mass in the posterior skull base bilaterally left greater than right. This involves the occipital bone with extension into the left temporal bone. The occipital bone is significantly expanded due to tumor on the left with mass-effect on the left  cerebellum. Soft tissue mass associated with left occipital lesion measures approximately 5.2 x 3.0 cm. Mild edema in the left cerebellum. There is infiltrating tumor in the left temporal bone which is more sizable than would be predicted from the CT. The soft tissue mass associated with the left occipital bone shows susceptibility which may be due to mineralization and/or bone fragments from bone destruction. Ventricle size normal. No midline shift. Moderate white matter changes likely due to chronic microvascular ischemia. No acute infarct. Chronic microhemorrhage in the left parietal lobe. Subtle enhancing lesions in the brain in the right frontal lobe measuring approximately 10 mm, and 4 mm best seen on coronal and sagittal postcontrast imaging. This is suspicious for metastatic disease. Vascular: Normal arterial flow voids. Normal venous enhancement. No evidence of venous sinus thrombosis. Skull and upper cervical spine: Infiltrating destructive mass in the occipital bone bilaterally left greater than right with extension into the left temporal bone. Additional lesions in the frontal bone bilaterally and in the left parietal bone likely metastatic disease. Sinuses/Orbits: Mild mucosal edema paranasal sinuses. Negative orbit Other: None IMPRESSION: Destructive mass lesion in the occipital bone bilaterally left greater than right. There is associated soft tissue mass in the left occipital bone extending into the posterior fossa with mass-effect and mild edema of the left cerebellum. This mass extends into the left temporal bone. Additional bone lesions are present in the frontal bones and left parietal bone. Subtle enhancing lesions right frontal lobe measuring 10 mm and 4 mm. Findings compatible with metastatic disease. Electronically Signed   By: Franchot Gallo M.D.   On: 02/15/2021 13:47   US Renal  Result Date: 02/18/2021 CLINICAL DATA:  Back pain. Right renal pelvis fullness on right upper quadrant  ultrasound performed 1 day prior EXAM: RENAL / URINARY TRACT ULTRASOUND COMPLETE COMPARISON:  02/17/2021 abdominal sonogram. FINDINGS: Right Kidney: Renal measurements: 10.3 x 4.2 x 4.2 cm = volume: 95 mL. Normal parenchymal echogenicity and thickness. Mild right hydronephrosis. Possible 5 mm right ureteropelvic junction stone. Nonobstructing 3 mm lower right renal stone. No renal masses. Left Kidney: Renal measurements: 10.0 x 4.8 x 4.2 cm = volume: 107 mL. Normal parenchymal echogenicity and thickness. No left hydronephrosis. No renal masses. Probable nonobstructing 7 mm upper left renal stone. Bladder: Appears normal for degree of bladder distention. Bilateral ureteral jets seen in the bladder. Other: None. IMPRESSION: 1. Mild right hydronephrosis. Possible 5 mm right UPJ stone. Nonobstructing 3 mm lower right renal stone. Suggest unenhanced CT abdomen/pelvis for further evaluation. 2. Probable nonobstructing upper left renal stone. Electronically Signed   By: Ilona Sorrel M.D.   On: 02/18/2021 12:02   CT CHEST ABDOMEN PELVIS W CONTRAST  Result Date: 02/15/2021 CLINICAL DATA:  Altered mental status. 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 EXAM: CT CHEST, ABDOMEN, AND PELVIS WITH CONTRAST TECHNIQUE: Multidetector CT imaging of the chest, abdomen and pelvis was performed following the standard protocol during bolus administration of intravenous contrast. CONTRAST:  75 mL OMNIPAQUE IOHEXOL 300 MG/ML  SOLN COMPARISON:  None. FINDINGS: CT CHEST FINDINGS Cardiovascular: No significant vascular findings. Normal heart size. No pericardial effusion. Mediastinum/Nodes: The patient has extensive lymphadenopathy. A 2 cm left axillary node is seen on image 11 of series 2; a second left axillary node on image 14 measures 1.4 cm. A 1.1 cm subpectoral node is seen on the left on image 15 of series 2. A 1.4 cm left supraclavicular node is seen on image 9. Right paratracheal node on image 15 measures 1.9  cm. Right precarinal nodal mass on image 25 measures 2.6 cm and there is a right hilar nodal mass measuring 4.2 x 3.1 cm on image 30. The esophagus and thyroid are negative. Lungs/Pleura: Small right pleural effusion. A spiculated nodule in the right middle lobe measures 2.6 cm transverse by 2.7 cm AP on image 77, series 4. Scattered pulmonary nodules include a 0.5 cm nodule in the superior segment of the left lower lobe on image 80, 0.4 cm left upper lobe nodule on image 76 and 2 punctate nodules on image 83. Additional smaller nodules are seen scattered through both lungs. The lungs are emphysematous. No pleural effusion. Musculoskeletal: A 0.9 cm lytic lesion is seen in T5, lytic lesions are seen in T7 and T9, larger in T9. There is also a small lytic lesion in T10. CT ABDOMEN PELVIS FINDINGS Hepatobiliary: Metastatic lesions are seen in the left hepatic lobe measuring 1.7 cm on image 57, series 2 and near the dome of the liver on image 50 of series 2 where a 1.1 cm lesion is identified. A second lesion near the dome of the liver in the right hepatic lobe on image 50 measures 0.7 cm. There is mild intra and extrahepatic biliary ductal dilatation likely related to prior cholecystectomy. Pancreas: Unremarkable. No pancreatic ductal dilatation or surrounding inflammatory changes. Spleen: Normal in size without focal abnormality. Adrenals/Urinary Tract: Adrenal glands are unremarkable. Kidneys are normal, without renal calculi, focal lesion, or hydronephrosis. Bladder is unremarkable. Stomach/Bowel: Stomach is within normal limits. Appendix appears normal. No evidence of bowel wall thickening, distention, or inflammatory changes. Moderately large colonic stool burden throughout noted. Vascular/Lymphatic: Aortic atherosclerosis. No aneurysm. No pathologic lymphadenopathy by CT size criteria. Reproductive: Status post  hysterectomy. No adnexal masses. Other: None. Musculoskeletal: Mottled appearance of the L2 vertebral  body is consistent with metastatic disease. There is a mild pathologic superior endplate compression fracture of L2 with vertebral body height loss centrally of approximately 30%. IMPRESSION: Findings consistent with extensive metastatic carcinoma likely emanating from a 2.7 cm right middle lobe pulmonary nodule. Metastases include extensive lymphadenopathy in the chest,, pulmonary nodule bone lesions and liver lesions. Mild pathologic fracture of L2 with vertebral body height loss centrally of up to approximately 30% is again noted. Aortic Atherosclerosis (ICD10-I70.0) and Emphysema (ICD10-J43.9). Electronically Signed   By: Inge Rise M.D.   On: 02/15/2021 14:25   CT Maxillofacial Wo Contrast  Result Date: 02/15/2021 CLINICAL DATA:  Headache with left-sided face/neck/head pain. On antibiotics for reported laryngitis with bilateral ear pain. EXAM: CT HEAD WITHOUT CONTRAST CT MAXILLOFACIAL WITHOUT CONTRAST TECHNIQUE: Multidetector CT imaging of the head and maxillofacial structures were performed using the standard protocol without intravenous contrast. Multiplanar CT image reconstructions of the maxillofacial structures were also generated. COMPARISON:  None. FINDINGS: CT HEAD FINDINGS Brain: The calvarial/skull base mass lesion is described below. Spur this process exerts mass effect on the left cerebellum with suspected narrowing of the fourth ventricle. No evidence of hydrocephalus. No evidence of acute large vascular territory infarct. No acute hemorrhage. Mild to moderate scattered white matter hypodensities, which are nonspecific but most likely relate to chronic microvascular ischemic disease. No midline shift. Vascular: No hyperdense vessel identified. Calcific atherosclerosis. Skull/skull base: There is aggressive soft tissue mass lesion involving the left occipital calvarium measuring up to approximately 5.5 x 3.1 by 3.3 cm (transverse by AP by craniocaudal) with bony destruction and expansion  and extension inferiorly to involve the left temporal bone and skull base. There is mass effect on the inferior left cerebellum and possible involvement of the subjacent dural venous sinuses. Small left mastoid effusion. CT MAXILLOFACIAL FINDINGS Osseous: No fracture or mandibular dislocation. No destructive process in the face. Orbits: Negative. No traumatic or inflammatory finding. Sinuses: Small right maxillary sinus retention cyst. Otherwise, sinuses are clear. Soft tissues: Negative. 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 Resulting mass effect on the left cerebellum and possible involvement of the subjacent dural venous sinuses. 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 Osteomyelitis is a differential consideration. Recommend MRI brain with and without contrast to further evaluate. Findings and recommendations discussed with Ashok Cordia, PA via telephone at 11:20 a.m. Electronically Signed   By: Margaretha Sheffield MD   On: 02/15/2021 11:29   US Abdomen Limited RUQ (LIVER/GB)  Result Date: 02/17/2021 CLINICAL DATA:  Nausea and vomiting for 1 day. EXAM: ULTRASOUND ABDOMEN LIMITED RIGHT UPPER QUADRANT COMPARISON:  CT chest, abdomen and pelvis 02/15/2021. FINDINGS: Gallbladder: Removed. Common bile duct: Diameter: 0.8 cm. Liver: Three hypoechoic liver lesions measuring up to 1.8 cm are identified are identified and correlate with metastatic deposit seen on prior CT. Mild intrahepatic biliary ductal dilatation seen on CT is also noted. Portal vein is patent on color Doppler imaging with normal direction of blood flow towards the liver. Other: There is fullness of the right renal pelvis which is new since the patient's CT scan yesterday. IMPRESSION: Three hypoechoic liver lesions measuring up to 1.8 cm correlate with  metastatic deposit seen on CT. Status post cholecystectomy. Mild intra and extrahepatic biliary ductal dilatation is  likely related to cholecystectomy. No obstructing lesion is identified. Fullness of the right renal pelvis is new since yesterday's examination. This could be incidental. Correlation with dedicated renal ultrasound could be used for further evaluation if indicated. Electronically Signed   By: Inge Rise M.D.   On: 02/17/2021 12:53    Assessment and plan-   # #2.7cm right middle lobe mass with extensive lymphadenopathy, liver lesion, and bone lesion. Most likely metastatic cancer, needs to establish tissue diagnosis. Pending IR biopsy of left supraclavicular lymphadenopathy or other sites if feasible. Hoarseness likely involvement of recurrent laryngeal nerve. Patient will follow up outpatient for discussion of biopsy results and management plan.  I discussed with her about the likelihood of systemic chemotherapy as well as radiation treatments.  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 Taper down to dexamethasone 4 mg twice daily  please discharge her on this regimen and I will further taper down outpatient. She will need palliative radiation-outpatient radiation oncology Continue antiemetics.  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  Neoplasm induced pain, history of substance abuse Patient reports not able to tolerate oral pain medication at this point. Has not needed Dilaudid.   Oxycodone 7.5 mg every 4 hours as needed. I recommend to increase fentanyl patch to 50 MCG/h We will further titrate her pain regimen outpatient.  She will follow up with palliative care service  #Smoke cessation was discussed with patient. Thank you for allowing me to participate in the care of this patient.   Earlie Server, MD, PhD Hematology Oncology [facility] at [facility] Pager- [phone] 02/21/2021

## 2021-02-21 NOTE — Plan of Care (Signed)

## 2021-02-22 ENCOUNTER — Encounter: Payer: Self-pay | Admitting: *Deleted

## 2021-02-22 ENCOUNTER — Telehealth: Payer: Self-pay | Admitting: Certified Nurse Midwife

## 2021-02-22 DIAGNOSIS — R85613 High grade squamous intraepithelial lesion on cytologic smear of anus (HGSIL): Secondary | ICD-10-CM

## 2021-02-22 DIAGNOSIS — D013 Carcinoma in situ of anus and anal canal: Secondary | ICD-10-CM

## 2021-02-22 NOTE — Telephone Encounter (Signed)
N/A- No VM.

## 2021-02-22 NOTE — Telephone Encounter (Signed)
New Message:  Pt returning call from yesterday

## 2021-02-22 NOTE — Progress Notes (Signed)
  Oncology Nurse Navigator Documentation  Navigator Location: [facility] (02/22/21 1300)   )Navigator Encounter Type: Telephone (02/22/21 1300) Telephone: Appt Confirmation/Clarification (02/22/21 1300)                       Barriers/Navigation Needs: Coordination of Care (02/22/21 1300)   Interventions: Coordination of Care (02/22/21 1300)   Coordination of Care: Appts (02/22/21 1300)           phone call made to patient and sister to review upcoming appts. Message left on pt's phone and sister's phone with details for upcoming appts. Instructed to call back with any further questions or needs.       Time Spent with Patient: 30 (02/22/21 1300)

## 2021-02-23 ENCOUNTER — Ambulatory Visit: Payer: Medicaid Other

## 2021-02-23 ENCOUNTER — Other Ambulatory Visit: Payer: Self-pay | Admitting: Oncology

## 2021-02-23 LAB — SURGICAL PATHOLOGY

## 2021-02-25 ENCOUNTER — Other Ambulatory Visit: Payer: Self-pay | Admitting: *Deleted

## 2021-02-25 ENCOUNTER — Other Ambulatory Visit: Payer: Self-pay

## 2021-02-25 ENCOUNTER — Encounter
Admission: RE | Admit: 2021-02-25 | Discharge: 2021-02-25 | Disposition: A | Discharge status: Home / Self Care | Payer: Medicaid Other | Source: Ambulatory Visit | Attending: Oncology | Admitting: Oncology

## 2021-02-25 ENCOUNTER — Other Ambulatory Visit: Payer: Self-pay | Admitting: Hospice and Palliative Medicine

## 2021-02-25 DIAGNOSIS — R918 Other nonspecific abnormal finding of lung field: Secondary | ICD-10-CM | POA: Diagnosis present

## 2021-02-25 DIAGNOSIS — M899 Disorder of bone, unspecified: Secondary | ICD-10-CM | POA: Diagnosis present

## 2021-02-25 MED ORDER — TECHNETIUM TC 99M MEDRONATE IV KIT
19.7900 | PACK | Freq: Once | INTRAVENOUS | Status: AC | PRN
  Administered 2021-02-25: 19.79 via INTRAVENOUS

## 2021-02-25 MED ORDER — OXYCODONE-ACETAMINOPHEN 5-325 MG PO TABS: 1.0000 | ORAL_TABLET | ORAL | 0 refills | Status: DC | PRN

## 2021-02-25 NOTE — Telephone Encounter (Signed)
Attempted to contact patient with results, unable to leave voicemail.    Dani Gobble, CNM [facility] 02/25/21 2:08 PM

## 2021-02-25 NOTE — Progress Notes (Signed)
Patient came to the [facility] today requesting refill of her Percocet.  Patient was dispensed Percocet 7.5 mg #20 tablets on 3/21.  It is reasonable that patient could be out of the medication today.  However, patient states that she had to pay out-of-pocket as Medicaid would not cover the 7.5 mg tablets.  She says that instead Medicaid will only cover the 5 mg tablets.  Will send a new prescription for Percocet 5-325mg  1 to 2 tablets every 4 hours as needed for breakthrough pain #60.  Patient continues to take the transdermal fentanyl but will need a refill of next week.  She has a patch currently applied on her left arm.   Patient denies any problems or concerns with pain medications.  She reports taking them as directed.  Case and plan discussed with Dr. Tasia Catchings.  [place] reviewed.

## 2021-02-27 ENCOUNTER — Emergency Department: Payer: Medicaid Other

## 2021-02-27 ENCOUNTER — Inpatient Hospital Stay: Payer: Medicaid Other

## 2021-02-27 ENCOUNTER — Encounter: Payer: Self-pay | Admitting: Emergency Medicine

## 2021-02-27 ENCOUNTER — Other Ambulatory Visit: Payer: Self-pay

## 2021-02-27 ENCOUNTER — Inpatient Hospital Stay
Admission: EM | Admit: 2021-02-27 | Discharge: 2021-03-02 | DRG: 871 | Disposition: A | Discharge status: Home / Self Care | Payer: Medicaid Other | Attending: Family Medicine | Admitting: Family Medicine

## 2021-02-27 DIAGNOSIS — K625 Hemorrhage of anus and rectum: Secondary | ICD-10-CM | POA: Diagnosis not present

## 2021-02-27 DIAGNOSIS — M4856XA Collapsed vertebra, not elsewhere classified, lumbar region, initial encounter for fracture: Secondary | ICD-10-CM | POA: Diagnosis present

## 2021-02-27 DIAGNOSIS — A419 Sepsis, unspecified organism: Secondary | ICD-10-CM | POA: Diagnosis not present

## 2021-02-27 DIAGNOSIS — Z7951 Long term (current) use of inhaled steroids: Secondary | ICD-10-CM

## 2021-02-27 DIAGNOSIS — D649 Anemia, unspecified: Secondary | ICD-10-CM | POA: Diagnosis present

## 2021-02-27 DIAGNOSIS — Z87891 Personal history of nicotine dependence: Secondary | ICD-10-CM

## 2021-02-27 DIAGNOSIS — J189 Pneumonia, unspecified organism: Secondary | ICD-10-CM

## 2021-02-27 DIAGNOSIS — E440 Moderate protein-calorie malnutrition: Secondary | ICD-10-CM | POA: Diagnosis not present

## 2021-02-27 DIAGNOSIS — Z880 Allergy status to penicillin: Secondary | ICD-10-CM

## 2021-02-27 DIAGNOSIS — G893 Neoplasm related pain (acute) (chronic): Secondary | ICD-10-CM

## 2021-02-27 DIAGNOSIS — I248 Other forms of acute ischemic heart disease: Secondary | ICD-10-CM | POA: Diagnosis present

## 2021-02-27 DIAGNOSIS — F418 Other specified anxiety disorders: Secondary | ICD-10-CM | POA: Diagnosis present

## 2021-02-27 DIAGNOSIS — Z681 Body mass index (BMI) 19 or less, adult: Secondary | ICD-10-CM

## 2021-02-27 DIAGNOSIS — J440 Chronic obstructive pulmonary disease with acute lower respiratory infection: Secondary | ICD-10-CM | POA: Diagnosis present

## 2021-02-27 DIAGNOSIS — R0602 Shortness of breath: Secondary | ICD-10-CM | POA: Diagnosis present

## 2021-02-27 DIAGNOSIS — I119 Hypertensive heart disease without heart failure: Secondary | ICD-10-CM | POA: Diagnosis present

## 2021-02-27 DIAGNOSIS — J449 Chronic obstructive pulmonary disease, unspecified: Secondary | ICD-10-CM | POA: Diagnosis not present

## 2021-02-27 DIAGNOSIS — D630 Anemia in neoplastic disease: Secondary | ICD-10-CM | POA: Diagnosis present

## 2021-02-27 DIAGNOSIS — C3490 Malignant neoplasm of unspecified part of unspecified bronchus or lung: Secondary | ICD-10-CM | POA: Diagnosis present

## 2021-02-27 DIAGNOSIS — T380X5A Adverse effect of glucocorticoids and synthetic analogues, initial encounter: Secondary | ICD-10-CM | POA: Diagnosis present

## 2021-02-27 DIAGNOSIS — Y95 Nosocomial condition: Secondary | ICD-10-CM | POA: Diagnosis present

## 2021-02-27 DIAGNOSIS — J9601 Acute respiratory failure with hypoxia: Secondary | ICD-10-CM | POA: Diagnosis present

## 2021-02-27 DIAGNOSIS — K921 Melena: Secondary | ICD-10-CM | POA: Diagnosis present

## 2021-02-27 DIAGNOSIS — Z515 Encounter for palliative care: Secondary | ICD-10-CM | POA: Diagnosis not present

## 2021-02-27 DIAGNOSIS — G9341 Metabolic encephalopathy: Secondary | ICD-10-CM | POA: Diagnosis present

## 2021-02-27 DIAGNOSIS — K219 Gastro-esophageal reflux disease without esophagitis: Secondary | ICD-10-CM | POA: Diagnosis present

## 2021-02-27 DIAGNOSIS — R778 Other specified abnormalities of plasma proteins: Secondary | ICD-10-CM

## 2021-02-27 DIAGNOSIS — C7951 Secondary malignant neoplasm of bone: Secondary | ICD-10-CM | POA: Diagnosis present

## 2021-02-27 DIAGNOSIS — C7931 Secondary malignant neoplasm of brain: Secondary | ICD-10-CM | POA: Diagnosis present

## 2021-02-27 DIAGNOSIS — D72829 Elevated white blood cell count, unspecified: Secondary | ICD-10-CM | POA: Diagnosis present

## 2021-02-27 DIAGNOSIS — M8448XA Pathological fracture, other site, initial encounter for fracture: Secondary | ICD-10-CM

## 2021-02-27 DIAGNOSIS — Z79899 Other long term (current) drug therapy: Secondary | ICD-10-CM

## 2021-02-27 DIAGNOSIS — Z7189 Other specified counseling: Secondary | ICD-10-CM | POA: Diagnosis not present

## 2021-02-27 DIAGNOSIS — I10 Essential (primary) hypertension: Secondary | ICD-10-CM | POA: Diagnosis present

## 2021-02-27 DIAGNOSIS — J441 Chronic obstructive pulmonary disease with (acute) exacerbation: Secondary | ICD-10-CM

## 2021-02-27 DIAGNOSIS — G928 Other toxic encephalopathy: Secondary | ICD-10-CM | POA: Diagnosis present

## 2021-02-27 DIAGNOSIS — Z20822 Contact with and (suspected) exposure to covid-19: Secondary | ICD-10-CM | POA: Diagnosis present

## 2021-02-27 DIAGNOSIS — Z801 Family history of malignant neoplasm of trachea, bronchus and lung: Secondary | ICD-10-CM

## 2021-02-27 DIAGNOSIS — B1920 Unspecified viral hepatitis C without hepatic coma: Secondary | ICD-10-CM | POA: Diagnosis present

## 2021-02-27 DIAGNOSIS — R059 Cough, unspecified: Secondary | ICD-10-CM

## 2021-02-27 DIAGNOSIS — C799 Secondary malignant neoplasm of unspecified site: Secondary | ICD-10-CM

## 2021-02-27 DIAGNOSIS — R7989 Other specified abnormal findings of blood chemistry: Secondary | ICD-10-CM | POA: Diagnosis present

## 2021-02-27 LAB — COMPREHENSIVE METABOLIC PANEL
ALT: 32 U/L (ref 0–44)
AST: 29 U/L (ref 15–41)
Albumin: 3.3 g/dL — ABNORMAL LOW (ref 3.5–5.0)
Alkaline Phosphatase: 312 U/L — ABNORMAL HIGH (ref 38–126)
Anion gap: 10 (ref 5–15)
BUN: 21 mg/dL — ABNORMAL HIGH (ref 6–20)
CO2: 25 mmol/L (ref 22–32)
Calcium: 8.3 mg/dL — ABNORMAL LOW (ref 8.9–10.3)
Chloride: 102 mmol/L (ref 98–111)
Creatinine, Ser: 0.68 mg/dL (ref 0.44–1.00)
GFR, Estimated: >60 mL/min (ref >60)
Glucose, Bld: 164 mg/dL — ABNORMAL HIGH (ref 70–99)
Potassium: 3.8 mmol/L (ref 3.5–5.1)
Sodium: 137 mmol/L (ref 135–145)
Total Bilirubin: 0.5 mg/dL (ref 0.3–1.2)
Total Protein: 6.8 g/dL (ref 6.5–8.1)

## 2021-02-27 LAB — CBC WITH DIFFERENTIAL/PLATELET
Abs Immature Granulocytes: 0.18 10*3/uL — ABNORMAL HIGH (ref 0.00–0.07)
Basophils Absolute: 0 10*3/uL (ref 0.0–0.1)
Basophils Relative: 0 %
Eosinophils Absolute: 0 10*3/uL (ref 0.0–0.5)
Eosinophils Relative: 0 %
HCT: 32.4 % — ABNORMAL LOW (ref 36.0–46.0)
Hemoglobin: 10.7 g/dL — ABNORMAL LOW (ref 12.0–15.0)
Immature Granulocytes: 1 %
Lymphocytes Relative: 5 %
Lymphs Abs: 1.1 10*3/uL (ref 0.7–4.0)
MCH: 28.6 pg (ref 26.0–34.0)
MCHC: 33 g/dL (ref 30.0–36.0)
MCV: 86.6 fL (ref 80.0–100.0)
Monocytes Absolute: 1.9 10*3/uL — ABNORMAL HIGH (ref 0.1–1.0)
Monocytes Relative: 8 %
Neutro Abs: 20.5 10*3/uL — ABNORMAL HIGH (ref 1.7–7.7)
Neutrophils Relative %: 86 %
Platelets: 241 10*3/uL (ref 150–400)
RBC: 3.74 MIL/uL — ABNORMAL LOW (ref 3.87–5.11)
RDW: 15.6 % — ABNORMAL HIGH (ref 11.5–15.5)
WBC: 23.6 10*3/uL — ABNORMAL HIGH (ref 4.0–10.5)
nRBC: 0 % (ref 0.0–0.2)

## 2021-02-27 LAB — RESP PANEL BY RT-PCR (FLU A&B, COVID) ARPGX2
Influenza A by PCR: NEGATIVE
Influenza B by PCR: NEGATIVE
SARS Coronavirus 2 by RT PCR: NEGATIVE

## 2021-02-27 LAB — MAGNESIUM: Magnesium: 2 mg/dL (ref 1.7–2.4)

## 2021-02-27 LAB — LACTIC ACID, PLASMA: Lactic Acid, Venous: 1.6 mmol/L (ref 0.5–1.9)

## 2021-02-27 LAB — TROPONIN I (HIGH SENSITIVITY)
Troponin I (High Sensitivity): 50 ng/L — ABNORMAL HIGH (ref <18)
Troponin I (High Sensitivity): 42 ng/L — ABNORMAL HIGH
Troponin I (High Sensitivity): 43 ng/L — ABNORMAL HIGH (ref <18)
Troponin I (High Sensitivity): 39 ng/L — ABNORMAL HIGH (ref <18)
Troponin I (High Sensitivity): 40 ng/L — ABNORMAL HIGH (ref <18)

## 2021-02-27 LAB — PROCALCITONIN: Procalcitonin: 0.1 ng/mL

## 2021-02-27 LAB — AMMONIA: Ammonia: 17 umol/L (ref 9–35)

## 2021-02-27 LAB — HIV ANTIBODY (ROUTINE TESTING W REFLEX): HIV Screen 4th Generation wRfx: NONREACTIVE

## 2021-02-27 LAB — BRAIN NATRIURETIC PEPTIDE: B Natriuretic Peptide: 127.2 pg/mL — ABNORMAL HIGH (ref 0.0–100.0)

## 2021-02-27 MED ORDER — LACTATED RINGERS IV BOLUS (SEPSIS)
500.0000 mL | Freq: Once | INTRAVENOUS | Status: AC
  Administered 2021-02-27: 500 mL via INTRAVENOUS

## 2021-02-27 MED ORDER — ASPIRIN EC 81 MG PO TBEC
81.0000 mg | DELAYED_RELEASE_TABLET | Freq: Every day | ORAL | Status: DC
  Administered 2021-02-27 – 2021-02-28 (×2): 81 mg via ORAL
  Filled 2021-02-27 (×2): qty 1

## 2021-02-27 MED ORDER — VANCOMYCIN HCL IN DEXTROSE 1-5 GM/200ML-% IV SOLN
1000.0000 mg | INTRAVENOUS | Status: DC
  Administered 2021-02-28 – 2021-03-01 (×3): 1000 mg via INTRAVENOUS
  Filled 2021-02-27 (×4): qty 200

## 2021-02-27 MED ORDER — ENOXAPARIN SODIUM 40 MG/0.4ML ~~LOC~~ SOLN
40.0000 mg | SUBCUTANEOUS | Status: DC
  Administered 2021-02-27: 40 mg via SUBCUTANEOUS
  Filled 2021-02-27: qty 0.4

## 2021-02-27 MED ORDER — IPRATROPIUM-ALBUTEROL 0.5-2.5 (3) MG/3ML IN SOLN
3.0000 mL | RESPIRATORY_TRACT | Status: DC
  Administered 2021-02-27 – 2021-02-28 (×5): 3 mL via RESPIRATORY_TRACT
  Filled 2021-02-27 (×4): qty 3

## 2021-02-27 MED ORDER — FENTANYL 50 MCG/HR TD PT72
1.0000 | MEDICATED_PATCH | TRANSDERMAL | Status: DC
  Administered 2021-02-28: 1 via TRANSDERMAL
  Filled 2021-02-27: qty 1

## 2021-02-27 MED ORDER — GABAPENTIN 300 MG PO CAPS
300.0000 mg | ORAL_CAPSULE | Freq: Every day | ORAL | Status: DC
  Administered 2021-02-27 – 2021-03-02 (×4): 300 mg via ORAL
  Filled 2021-02-27 (×4): qty 1

## 2021-02-27 MED ORDER — LORAZEPAM 2 MG/ML IJ SOLN
1.0000 mg | Freq: Once | INTRAMUSCULAR | Status: AC
  Administered 2021-02-27: 1 mg via INTRAVENOUS
  Filled 2021-02-27: qty 1

## 2021-02-27 MED ORDER — DM-GUAIFENESIN ER 30-600 MG PO TB12
1.0000 | ORAL_TABLET | Freq: Two times a day (BID) | ORAL | Status: DC | PRN
  Administered 2021-02-28: 1 via ORAL
  Filled 2021-02-27: qty 1

## 2021-02-27 MED ORDER — ACETAMINOPHEN 325 MG PO TABS: 650.0000 mg | ORAL_TABLET | Freq: Four times a day (QID) | ORAL | Status: DC | PRN

## 2021-02-27 MED ORDER — HYDRALAZINE HCL 20 MG/ML IJ SOLN: 5.0000 mg | INTRAMUSCULAR | Status: DC | PRN

## 2021-02-27 MED ORDER — OXYCODONE-ACETAMINOPHEN 5-325 MG PO TABS
1.0000 | ORAL_TABLET | Freq: Four times a day (QID) | ORAL | Status: DC | PRN
  Administered 2021-02-28 – 2021-03-02 (×6): 1 via ORAL
  Filled 2021-02-27 (×7): qty 1

## 2021-02-27 MED ORDER — DEXAMETHASONE 4 MG PO TABS
4.0000 mg | ORAL_TABLET | Freq: Two times a day (BID) | ORAL | Status: DC
  Administered 2021-02-28: 4 mg via ORAL
  Filled 2021-02-27: qty 1

## 2021-02-27 MED ORDER — SODIUM CHLORIDE 0.9 % IV SOLN
2.0000 g | Freq: Three times a day (TID) | INTRAVENOUS | Status: DC
  Administered 2021-02-27 – 2021-03-02 (×9): 2 g via INTRAVENOUS
  Filled 2021-02-27 (×13): qty 2

## 2021-02-27 MED ORDER — MORPHINE SULFATE (PF) 4 MG/ML IV SOLN
6.0000 mg | Freq: Once | INTRAVENOUS | Status: DC
  Filled 2021-02-27 (×2): qty 2

## 2021-02-27 MED ORDER — HYDROCORTISONE NA SUCCINATE PF 100 MG IJ SOLR
50.0000 mg | Freq: Once | INTRAMUSCULAR | Status: AC
  Administered 2021-02-27: 50 mg via INTRAVENOUS
  Filled 2021-02-27: qty 1

## 2021-02-27 MED ORDER — MOMETASONE FURO-FORMOTEROL FUM 100-5 MCG/ACT IN AERO
2.0000 | INHALATION_SPRAY | Freq: Two times a day (BID) | RESPIRATORY_TRACT | Status: DC
  Administered 2021-02-27 – 2021-03-02 (×6): 2 via RESPIRATORY_TRACT
  Filled 2021-02-27: qty 8.8

## 2021-02-27 MED ORDER — LACTATED RINGERS IV SOLN: INTRAVENOUS | Status: DC

## 2021-02-27 MED ORDER — ALBUTEROL SULFATE (2.5 MG/3ML) 0.083% IN NEBU
2.5000 mg | INHALATION_SOLUTION | RESPIRATORY_TRACT | Status: DC | PRN
  Filled 2021-02-27: qty 3

## 2021-02-27 MED ORDER — ENOXAPARIN SODIUM 30 MG/0.3ML ~~LOC~~ SOLN: 30.0000 mg | SUBCUTANEOUS | Status: DC

## 2021-02-27 MED ORDER — LEVETIRACETAM IN NACL 500 MG/100ML IV SOLN
500.0000 mg | Freq: Once | INTRAVENOUS | Status: AC
  Administered 2021-02-27: 500 mg via INTRAVENOUS
  Filled 2021-02-27: qty 100

## 2021-02-27 MED ORDER — IOHEXOL 350 MG/ML SOLN
75.0000 mL | Freq: Once | INTRAVENOUS | Status: AC | PRN
  Administered 2021-02-27: 75 mL via INTRAVENOUS

## 2021-02-27 MED ORDER — BUSPIRONE HCL 10 MG PO TABS
10.0000 mg | ORAL_TABLET | Freq: Two times a day (BID) | ORAL | Status: DC
  Administered 2021-02-27 – 2021-03-02 (×7): 10 mg via ORAL
  Filled 2021-02-27 (×8): qty 1

## 2021-02-27 MED ORDER — QUETIAPINE FUMARATE 200 MG PO TABS
400.0000 mg | ORAL_TABLET | Freq: Every day | ORAL | Status: DC
  Administered 2021-02-27 – 2021-03-01 (×3): 400 mg via ORAL
  Filled 2021-02-27 (×5): qty 2

## 2021-02-27 MED ORDER — LACTATED RINGERS IV BOLUS (SEPSIS)
1000.0000 mL | Freq: Once | INTRAVENOUS | Status: AC
  Administered 2021-02-27: 1000 mL via INTRAVENOUS

## 2021-02-27 MED ORDER — SODIUM CHLORIDE 0.9 % IV SOLN
2.0000 g | Freq: Once | INTRAVENOUS | Status: AC
  Administered 2021-02-27: 2 g via INTRAVENOUS
  Filled 2021-02-27: qty 2

## 2021-02-27 MED ORDER — ENSURE ENLIVE PO LIQD
237.0000 mL | Freq: Three times a day (TID) | ORAL | Status: DC
  Administered 2021-02-27: 21:00:00 237 mL via ORAL

## 2021-02-27 MED ORDER — HYDROXYZINE HCL 25 MG PO TABS
25.0000 mg | ORAL_TABLET | Freq: Three times a day (TID) | ORAL | Status: DC | PRN
  Administered 2021-02-27 – 2021-02-28 (×4): 25 mg via ORAL
  Filled 2021-02-27 (×5): qty 1

## 2021-02-27 MED ORDER — ONDANSETRON HCL 4 MG/2ML IJ SOLN: 4.0000 mg | Freq: Three times a day (TID) | INTRAMUSCULAR | Status: DC | PRN

## 2021-02-27 MED ORDER — PANTOPRAZOLE SODIUM 40 MG PO TBEC
40.0000 mg | DELAYED_RELEASE_TABLET | Freq: Every day | ORAL | Status: DC
  Administered 2021-02-27 – 2021-02-28 (×2): 40 mg via ORAL
  Filled 2021-02-27 (×2): qty 1

## 2021-02-27 MED ORDER — FUROSEMIDE 10 MG/ML IJ SOLN
20.0000 mg | Freq: Once | INTRAMUSCULAR | Status: AC
  Administered 2021-02-27: 20 mg via INTRAVENOUS
  Filled 2021-02-27: qty 2

## 2021-02-27 MED ORDER — LACTATED RINGERS IV BOLUS (SEPSIS)
250.0000 mL | Freq: Once | INTRAVENOUS | Status: AC
  Administered 2021-02-27: 250 mL via INTRAVENOUS

## 2021-02-27 MED ORDER — IPRATROPIUM-ALBUTEROL 0.5-2.5 (3) MG/3ML IN SOLN
6.0000 mL | Freq: Once | RESPIRATORY_TRACT | Status: AC
  Administered 2021-02-27: 6 mL via RESPIRATORY_TRACT
  Filled 2021-02-27: qty 6

## 2021-02-27 MED ORDER — VANCOMYCIN HCL IN DEXTROSE 1-5 GM/200ML-% IV SOLN
1000.0000 mg | Freq: Once | INTRAVENOUS | Status: AC
  Administered 2021-02-27: 1000 mg via INTRAVENOUS
  Filled 2021-02-27: qty 200

## 2021-02-27 NOTE — ED Notes (Signed)
Pt presents to the ED for SOB and congested cough for the past week that got worse this AM. Pt has a hx of metastatic lung cancer and has been taking all her medication regularly. Pt denies pain but states she does have some tightness in her chest. On assessment, pt voice is hoarse, which she states is not normal for her, and audible congestion heard. Pt is A&Ox4 and NAD. O2 saturations are normal at this time. HR is elevated at 113.   Dr. Charlsie Quest at bedside for evaluation.

## 2021-02-27 NOTE — Progress Notes (Signed)
elink monitoring for sepsis.

## 2021-02-27 NOTE — ED Notes (Signed)
Pt transported to CT at this time.

## 2021-02-27 NOTE — Plan of Care (Signed)

## 2021-02-27 NOTE — Progress Notes (Signed)
Pharmacy Antibiotic Note  Brenda Middleton is a 60 y.o. 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  Pharmacy has been consulted for vancomycin and cefepime dosing. In the ED she received 2 grams IV cefepime and 1000 mg IV vancomycin. She has a noted allergy to penicillins but has received the first dose of cefepime with no ADR reported  Plan:  1) start vancomycin 1000 mg IV Q 24 hrs  Goal AUC 400-550  Expected AUC: 486.1  SCr used: 0.80 (rounded up)  Ke: 0.055 h-1, T1/2: 12.6 h  Daily SCr while on IV vancomycin  Levels as clinically indicated  MRSA PCR to narrow coverage  2) start cefepime 2 grams IV every 8 hours   Height: 5\' 6"  (167.6 cm) Weight: 51.8 kg (114 lb 3.2 oz) IBW/kg (Calculated) : 59.3  Temp (24hrs), Avg:98.3 F (36.8 C), Min:98.3 F (36.8 C), Max:98.3 F (36.8 C)  Recent Labs  Lab 02/21/21 0418 02/27/21 0819 02/27/21 0907  WBC  --  23.6*  --   CREATININE 0.77 0.68  --   LATICACIDVEN  --   --  1.6    Estimated Creatinine Clearance: 61.2 mL/min (by C-G formula based on SCr of 0.68 mg/dL).    Allergies  Allergen Reactions  . Penicillin G Hives    Other reaction(s): HIVES Other reaction(s): HIVES  . Penicillin V Itching  . Penicillins     Antimicrobials this admission: vancomycin 3/27 >>  cefepime 3/27 >>   Microbiology results: 3/27 BCx: pending 3/27 SARS CoV-2: negative 3/27 influenza A/B: negative 3/27 MRSA PCR: pending  Thank you for allowing pharmacy to be a part of this patient's care.  Dallie Piles 02/27/2021 12:13 PM

## 2021-02-27 NOTE — Progress Notes (Signed)
CODE SEPSIS - PHARMACY COMMUNICATION  **Broad Spectrum Antibiotics should be administered within 1 hour of Sepsis diagnosis**  Time Code Sepsis Called/Page Received: 6701  Antibiotics Ordered: vancomycin and cefepime  Time of 1st antibiotic administration: 0910  Additional action taken by pharmacy: none required  If necessary, Name of Provider/Nurse Contacted: N/A    Dallie Piles ,PharmD Clinical Pharmacist  02/27/2021  8:51 AM

## 2021-02-27 NOTE — Consult Note (Signed)
Hematology/Oncology Consult note [facility] [phone] [phone]  Patient Care Team: Remi Haggard, FNP as PCP - General (Family Medicine) Remi Haggard, FNP (Family Medicine) Christene Lye, MD (General Surgery) Telford Nab, RN as Oncology Nurse Navigator   Name of the patient: Brenda Middleton  [id number]  1961-10-17   Date of visit: 02/27/21 REASON FOR COSULTATION:  HCAP, lung cancer History of presenting illness-  60 y.o. female with PMH includes newly diagnosed metastatic lung cancer, hypertension, COPD, depression, history of substance abuse, anemia, HCV, anxiety presented to emergency room for evaluation of shortness of breath She has an appointment with me tomorrow for further discussion of the newly diagnosed metastatic lung cancer. She is currently on steroids due to the cerebellum edema secondary to skull bone metastasis. She is on fentanyl patch and as needed oxycodone/acetaminophen.  She reports pain is well controlled.  No nausea vomiting.  In the emergency room, Covid PCR negative, oxygen saturation 90%.  Chest x-ray showed new hazy opacity bilaterally. CT chest angiogram was negative for PE but showed new extensive groundglass airspace opacities throughout the lungs. Diffuse bilateral bronchial wall thickening.  Redemonstrated advanced metastatic lung malignancy. Patient is admitted for HCAP treatments.  And started on vancomycin and cefepime. Heme-onc was consulted  Sister [name] is at bedside.  Patient is slightly drowsy after being given 1 mg Ativan, arousable.   Review of Systems  Constitutional: Positive for appetite change and fatigue. Negative for chills and fever.  HENT:   Negative for hearing loss and voice change.   Eyes: Negative for eye problems.  Respiratory: Positive for cough and shortness of breath. Negative for chest tightness.   Cardiovascular: Negative for chest pain.   Gastrointestinal: Negative for abdominal distention, abdominal pain and blood in stool.  Endocrine: Negative for hot flashes.  Genitourinary: Negative for difficulty urinating and frequency.   Musculoskeletal: Negative for arthralgias.  Skin: Negative for itching and rash.  Neurological: Negative for extremity weakness.  Hematological: Negative for adenopathy.  Psychiatric/Behavioral: Negative for confusion.    Allergies  Allergen Reactions  . Penicillin G Hives    Other reaction(s): HIVES Other reaction(s): HIVES  . Penicillin V Itching  . Penicillins     Patient Active Problem List   Diagnosis Date Noted  . HCAP (healthcare-associated pneumonia) 02/27/2021  . HTN (hypertension) 02/27/2021  . Depression with anxiety 02/27/2021  . Normocytic anemia 02/27/2021  . Sepsis ([place]) 02/27/2021  . Protein-calorie malnutrition, moderate ([place]) 02/27/2021  . Lung cancer ([place]) 02/27/2021  . Elevated troponin 02/27/2021  . Acute metabolic encephalopathy 84/69/6295  . Brain metastases ([place]) 02/27/2021  . Metastatic malignant neoplasm ([place])   . Intractable low back pain 02/18/2021  . Anxiety   . Depressive disorder   . Pathologic fracture of lumbar vertebra, initial encounter   . 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 COPD (chronic obstructive pulmonary disease) ([place])   . GERD (gastroesophageal reflux disease)   . Palliative care encounter   . Pathologic compression fracture of lumbar vertebra ([place])   . Goals of care, counseling/discussion   . Neoplasm related pain   . Iron deficiency anemia due to chronic blood loss 09/12/2019  . Family history of cancer 09/12/2019  . Blood in the stool 09/12/2019  . Elevated alkaline phosphatase level 09/12/2019  . Chronic low back pain 08/26/2014     Past Medical History:  Diagnosis Date  . Anxiety   . Benign neoplasm of cervix uteri   .  Chronic hepatitis C without mention of hepatic coma   . Chronic low back pain 08/26/2014  .  Constipation   . Depressive disorder   . Displacement of cervical intervertebral disc   . Hypertensive disorder   . Iron deficiency anemia due to chronic blood loss 09/12/2019  . Palpitations   . Prolapsed cervical intervertebral disc      Past Surgical History:  Procedure Laterality Date  . CONIZATION CERVIX    . DILATION AND CURETTAGE OF UTERUS    . HAND SURGERY  2008,2015   Carpel Tunnel  . TONSILLECTOMY    . VULVECTOMY      Social History   Socioeconomic History  . Marital status: Single    Spouse name: Not on file  . Number of children: 0  . Years of education: college1  . Highest education level: Not on file  Occupational History    Employer: UNEMPLOYED  Tobacco Use  . Smoking status: Former Smoker    Packs/day: 0.70    Years: 35.00    Pack years: 24.50    Types: Cigarettes  . Smokeless tobacco: Never Used  Vaping Use  . Vaping Use: Never used  Substance and Sexual Activity  . Alcohol use: Yes    Comment: occasional wine  . Drug use: No  . Sexual activity: Not Currently  Other Topics Concern  . Not on file  Social History Narrative  . Not on file   Social Determinants of Health   Financial Resource Strain: Not on file  Food Insecurity: Not on file  Transportation Needs: Not on file  Physical Activity: Not on file  Stress: Not on file  Social Connections: Not on file  Intimate Partner Violence: Not on file     Family History  Problem Relation Age of Onset  . Breast cancer Mother 21  . Lung cancer Father   . Cancer Brother   . Cancer Other   . Stroke Other      Current Facility-Administered Medications:  .  acetaminophen (TYLENOL) tablet 650 mg, 650 mg, Oral, Q6H PRN, Ivor Costa, MD .  albuterol (PROVENTIL) (2.5 MG/3ML) 0.083% nebulizer solution 2.5 mg, 2.5 mg, Nebulization, Q4H PRN, Ivor Costa, MD .  aspirin EC tablet 81 mg, 81 mg, Oral, Daily, Ivor Costa, MD, 81 mg at 02/27/21 1404 .  busPIRone (BUSPAR) tablet 10 mg, 10 mg, Oral, BID,  Ivor Costa, MD, 10 mg at 02/27/21 1404 .  ceFEPIme (MAXIPIME) 2 g in sodium chloride 0.9 % 100 mL IVPB, 2 g, Intravenous, Q8H, Dallie Piles, RPH, Last Rate: 200 mL/hr at 02/27/21 1443, 2 g at 02/27/21 1443 .  [START ON 02/28/2021] dexamethasone (DECADRON) tablet 4 mg, 4 mg, Oral, BID, Ivor Costa, MD .  dextromethorphan-guaiFENesin [facility] DM) 30-600 MG per 12 hr tablet 1 tablet, 1 tablet, Oral, BID PRN, Ivor Costa, MD .  enoxaparin (LOVENOX) injection 40 mg, 40 mg, Subcutaneous, Q24H, Benn Moulder, RPH .  feeding supplement (ENSURE ENLIVE / ENSURE PLUS) liquid 237 mL, 237 mL, Oral, TID BM, Ivor Costa, MD .  Derrill Memo ON 02/28/2021] fentaNYL (DURAGESIC) 50 MCG/HR 1 patch, 1 patch, Transdermal, Q72H, Ivor Costa, MD .  gabapentin (NEURONTIN) capsule 300 mg, 300 mg, Oral, Daily, Ivor Costa, MD, 300 mg at 02/27/21 1403 .  hydrALAZINE (APRESOLINE) injection 5 mg, 5 mg, Intravenous, Q2H PRN, Ivor Costa, MD .  hydrOXYzine (ATARAX/VISTARIL) tablet 25 mg, 25 mg, Oral, TID PRN, Ivor Costa, MD, 25 mg at 02/27/21 1404 .  ipratropium-albuterol (DUONEB)  0.5-2.5 (3) MG/3ML nebulizer solution 3 mL, 3 mL, Nebulization, Q4H, Ivor Costa, MD, 3 mL at 02/27/21 1410 .  mometasone-formoterol (DULERA) 100-5 MCG/ACT inhaler 2 puff, 2 puff, Inhalation, BID, Ivor Costa, MD .  morphine 4 MG/ML injection 6 mg, 6 mg, Intravenous, Once, Ivor Costa, MD .  ondansetron [facility]) injection 4 mg, 4 mg, Intravenous, Q8H PRN, Ivor Costa, MD .  oxyCODONE-acetaminophen (PERCOCET/ROXICET) 5-325 MG per tablet 1 tablet, 1 tablet, Oral, Q6H PRN, Ivor Costa, MD .  pantoprazole (PROTONIX) EC tablet 40 mg, 40 mg, Oral, Daily, Ivor Costa, MD, 40 mg at 02/27/21 1403 .  QUEtiapine (SEROQUEL) tablet 400 mg, 400 mg, Oral, QHS, Niu, Xilin, MD .  vancomycin (VANCOCIN) IVPB 1000 mg/200 mL premix, 1,000 mg, Intravenous, Q24H, Dallie Piles, [facility]   Physical exam:  Vitals:   02/27/21 1316 02/27/21 1421 02/27/21 1422 02/27/21 1524  BP: (!)  175/98   (!) 142/91  Pulse: (!) 120   (!) 118  Resp: 16   12  Temp: 98.7 F (37.1 C)   98.3 F (36.8 C)  TempSrc: Oral     SpO2: (!) 86% (!) 88% 94% 95%  Weight:      Height:       Physical Exam Constitutional:      General: She is not in acute distress.    Appearance: She is not diaphoretic.  HENT:     Head: Normocephalic and atraumatic.     Nose: Nose normal.     Mouth/Throat:     Pharynx: No oropharyngeal exudate.  Eyes:     General: No scleral icterus.    Pupils: Pupils are equal, round, and reactive to light.  Cardiovascular:     Rate and Rhythm: Normal rate and regular rhythm.     Heart sounds: No murmur heard.   Pulmonary:     Effort: Pulmonary effort is normal. No respiratory distress.     Breath sounds: No rales.     Comments: Crackles bilaterally Chest:     Chest wall: No tenderness.  Abdominal:     General: There is no distension.     Palpations: Abdomen is soft.     Tenderness: There is no abdominal tenderness.  Musculoskeletal:        General: Normal range of motion.     Cervical back: Normal range of motion and neck supple.  Skin:    General: Skin is warm and dry.     Findings: No erythema.  Neurological:     Cranial Nerves: No cranial nerve deficit.     Motor: No abnormal muscle tone.     Coordination: Coordination normal.     Comments: Drowsy due to Ativan given in emergency room  Psychiatric:        Mood and Affect: Affect normal.         CMP Latest Ref Rng & Units 02/27/2021  Glucose 70 - 99 mg/dL 164(H)  BUN 6 - 20 mg/dL 21(H)  Creatinine 0.44 - 1.00 mg/dL 0.68  Sodium 135 - 145 mmol/L 137  Potassium 3.5 - 5.1 mmol/L 3.8  Chloride 98 - 111 mmol/L 102  CO2 22 - 32 mmol/L 25  Calcium 8.9 - 10.3 mg/dL 8.3(L)  Total Protein 6.5 - 8.1 g/dL 6.8  Total Bilirubin 0.3 - 1.2 mg/dL 0.5  Alkaline Phos 38 - 126 U/L 312(H)  AST 15 - 41 U/L 29  ALT 0 - 44 U/L 32   CBC Latest Ref Rng & Units 02/27/2021  WBC 4.0 -  10.5 K/uL 23.6(H)  Hemoglobin  12.0 - 15.0 g/dL 10.7(L)  Hematocrit 36.0 - 46.0 % 32.4(L)  Platelets 150 - 400 K/uL 241    RADIOGRAPHIC STUDIES: I have personally reviewed the radiological images as listed and agreed with the findings in the report. DG Chest 2 View  Result Date: 02/15/2021 CLINICAL DATA:  Weight loss and cough EXAM: CHEST - 2 VIEW COMPARISON:  None. FINDINGS: The heart size and mediastinal contours are within normal limits. Rounded patchy nodular opacity seen within the right upper lobe and right mid lung. The left lung is clear. No acute osseous abnormality. IMPRESSION: Rounded patchy airspace opacities in the right mid lung which may be due to infectious etiology, however cannot exclude metastatic disease. Would recommend CT chest with contrast for further evaluation. Electronically Signed   By: Prudencio Pair M.D.   On: 02/15/2021 12:16   CT Head Wo Contrast  Result Date: 02/15/2021 CLINICAL DATA:  Headache with left-sided face/neck/head pain. On antibiotics for reported laryngitis with bilateral ear pain. EXAM: CT HEAD WITHOUT CONTRAST CT MAXILLOFACIAL WITHOUT CONTRAST TECHNIQUE: Multidetector CT imaging of the head and maxillofacial structures were performed using the standard protocol without intravenous contrast. Multiplanar CT image reconstructions of the maxillofacial structures were also generated. COMPARISON:  None. FINDINGS: CT HEAD FINDINGS Brain: The calvarial/skull base mass lesion is described below. Spur this process exerts mass effect on the left cerebellum with suspected narrowing of the fourth ventricle. No evidence of hydrocephalus. No evidence of acute large vascular territory infarct. No acute hemorrhage. Mild to moderate scattered white matter hypodensities, which are nonspecific but most likely relate to chronic microvascular ischemic disease. No midline shift. Vascular: No hyperdense vessel identified. Calcific atherosclerosis. Skull/skull base: There is aggressive soft tissue mass lesion  involving the left occipital calvarium measuring up to approximately 5.5 x 3.1 by 3.3 cm (transverse by AP by craniocaudal) with bony destruction and expansion and extension inferiorly to involve the left temporal bone and skull base. There is mass effect on the inferior left cerebellum and possible involvement of the subjacent dural venous sinuses. Small left mastoid effusion. CT MAXILLOFACIAL FINDINGS Osseous: No fracture or mandibular dislocation. No destructive process in the face. Orbits: Negative. No traumatic or inflammatory finding. Sinuses: Small right maxillary sinus retention cyst. Otherwise, sinuses are clear. Soft tissues: Negative. 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 Resulting mass effect on the left cerebellum and possible involvement of the subjacent dural venous sinuses. 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 Osteomyelitis is a differential consideration. Recommend MRI brain with and without contrast to further evaluate. Findings and recommendations discussed with Ashok Cordia, PA via telephone at 11:20 a.m. Electronically Signed   By: Margaretha Sheffield MD   On: 02/15/2021 11:29   CT Angio Chest PE W and/or Wo Contrast  Result Date: 02/27/2021 CLINICAL DATA:  PE suspected, widely metastatic lung cancer EXAM: CT ANGIOGRAPHY CHEST WITH CONTRAST TECHNIQUE: Multidetector CT imaging of the chest was performed using the standard protocol during bolus administration of intravenous contrast. Multiplanar CT image reconstructions and MIPs were obtained to evaluate the vascular anatomy. CONTRAST:  27mL OMNIPAQUE IOHEXOL 350 MG/ML SOLN COMPARISON:  02/15/2021 FINDINGS: Cardiovascular: Satisfactory opacification of the pulmonary arteries to the segmental level. No evidence of pulmonary embolism. Cardiomegaly.  Left and right coronary artery calcifications and stents. No pericardial effusion. Mediastinum/Nodes: Redemonstrated,  very extensive bulky bilateral hilar, mediastinal, supraclavicular, and left axillary lymphadenopathy. Thyroid gland, trachea, and esophagus demonstrate no significant findings. Lungs/Pleura: Diffuse bilateral bronchial wall thickening. Spiculated right upper lobe mass as seen on prior examination (series 7, image 42). Numerous redemonstrated small pulmonary nodules throughout the lungs. There is new, extensive ground-glass airspace opacity throughout the lungs, somewhat geographic in the upper lobes (series 7, image 33), although somewhat more nodular appearing in the lower lungs (series 7, image 58). No pleural effusion or pneumothorax. Upper Abdomen: No acute abnormality. Multiple low-attenuation lesions of liver, better assessed by prior dedicated of the abdomen. Musculoskeletal: No chest wall abnormality. Multiple osseous metastatic lesions, most significantly a lytic lesion of the T9 vertebral body (series 9, image 58). Review of the MIP images confirms the above findings. IMPRESSION: 1. Negative examination for pulmonary embolism. 2. There is new, extensive ground-glass airspace opacity throughout the lungs, somewhat geographic in the upper lobes, although somewhat more nodular appearing in the lower lungs. Findings are nonspecific and infectious or inflammatory, differential considerations primarily include drug toxicity and atypical/viral infection. 3. Diffuse bilateral bronchial wall thickening, consistent with nonspecific infectious or inflammatory bronchitis. 4. Redemonstrated findings of advanced metastatic lung malignancy, better assessed by recent prior staging examination. 5. Coronary artery disease. Electronically Signed   By: Eddie Candle M.D.   On: 02/27/2021 10:49   CT Thoracic Spine Wo Contrast  Result Date: 02/18/2021 CLINICAL DATA:  Metastases on CT abdomen EXAM: CT  THORACIC AND LUMBAR SPINE WITHOUT CONTRAST TECHNIQUE: Multidetector CT imaging of the thoracic and lumbar spine was performed without contrast. Multiplanar CT image reconstructions were also generated. COMPARISON:  None. FINDINGS: CT THORACIC SPINE FINDINGS Alignment: Preserved. Vertebrae: There is a small partially sclerotic lesion at the right aspect of the T5 vertebral body. A lytic lesion is present at the anterior aspect T9 likely with pathologic fracture but no substantial height loss. Ill-defined lucent lesions are present elsewhere. Paraspinal and other soft tissues: Better evaluated on prior dedicated imaging. Disc levels: Multilevel degenerative changes. No high-grade degenerative canal narrowing. CT LUMBAR SPINE FINDINGS Segmentation: 5 lumbar type vertebrae. Alignment: Dextrocurvature.  Grade 1 anterolisthesis at L4-L5 Vertebrae: Lucency and sclerosis at L2 with pathologic compression fracture resulting in less than 50% loss of height at the superior endplate. Sclerotic lesion of the right sacrum. Paraspinal and other soft tissues: Better evaluated on prior dedicated imaging. Disc levels: Multilevel degenerative changes. Canal stenosis is greatest at L3-L4 and L4-L5. Foraminal narrowing is greatest on the right at L5-S1. IMPRESSION: Sclerotic and lytic metastatic lesions as already identified on the CT chest, abdomen, and pelvis. Pathologic L2 fracture with less than 50% loss of height. Probable pathologic fracture at T9 without height loss. No apparent significant epidural disease by CT. Electronically Signed   By: Macy Mis M.D.   On: 02/18/2021 10:53   CT Lumbar Spine Wo Contrast  Result Date: 02/18/2021 CLINICAL DATA:  Metastases on CT abdomen EXAM: CT THORACIC AND LUMBAR SPINE WITHOUT CONTRAST TECHNIQUE: Multidetector CT imaging of the thoracic and lumbar spine was performed without contrast. Multiplanar CT image reconstructions were also generated. COMPARISON:  None. FINDINGS: CT THORACIC  SPINE FINDINGS Alignment: Preserved. Vertebrae: There is a small partially sclerotic lesion at the right aspect of the T5 vertebral body. A lytic lesion is present at the anterior aspect T9 likely with pathologic fracture but no substantial height loss. Ill-defined lucent lesions are present elsewhere. Paraspinal and other soft tissues: Better evaluated on prior dedicated imaging. Disc levels: Multilevel degenerative  changes. No high-grade degenerative canal narrowing. CT LUMBAR SPINE FINDINGS Segmentation: 5 lumbar type vertebrae. Alignment: Dextrocurvature.  Grade 1 anterolisthesis at L4-L5 Vertebrae: Lucency and sclerosis at L2 with pathologic compression fracture resulting in less than 50% loss of height at the superior endplate. Sclerotic lesion of the right sacrum. Paraspinal and other soft tissues: Better evaluated on prior dedicated imaging. Disc levels: Multilevel degenerative changes. Canal stenosis is greatest at L3-L4 and L4-L5. Foraminal narrowing is greatest on the right at L5-S1. IMPRESSION: Sclerotic and lytic metastatic lesions as already identified on the CT chest, abdomen, and pelvis. Pathologic L2 fracture with less than 50% loss of height. Probable pathologic fracture at T9 without height loss. No apparent significant epidural disease by CT. Electronically Signed   By: Macy Mis M.D.   On: 02/18/2021 10:53   MR Brain W and Wo Contrast  Result Date: 02/15/2021 CLINICAL DATA:  Abnormal head CT.  Skull base mass. EXAM: MRI HEAD WITHOUT AND WITH CONTRAST TECHNIQUE: Multiplanar, multiecho pulse sequences of the brain and surrounding structures were obtained without and with intravenous contrast. CONTRAST:  20mL GADAVIST GADOBUTROL 1 MMOL/ML IV SOLN COMPARISON:  CT head 02/15/2021 FINDINGS: Brain: Large destructive mass in the posterior skull base bilaterally left greater than right. This involves the occipital bone with extension into the left temporal bone. The occipital bone is  significantly expanded due to tumor on the left with mass-effect on the left cerebellum. Soft tissue mass associated with left occipital lesion measures approximately 5.2 x 3.0 cm. Mild edema in the left cerebellum. There is infiltrating tumor in the left temporal bone which is more sizable than would be predicted from the CT. The soft tissue mass associated with the left occipital bone shows susceptibility which may be due to mineralization and/or bone fragments from bone destruction. Ventricle size normal. No midline shift. Moderate white matter changes likely due to chronic microvascular ischemia. No acute infarct. Chronic microhemorrhage in the left parietal lobe. Subtle enhancing lesions in the brain in the right frontal lobe measuring approximately 10 mm, and 4 mm best seen on coronal and sagittal postcontrast imaging. This is suspicious for metastatic disease. Vascular: Normal arterial flow voids. Normal venous enhancement. No evidence of venous sinus thrombosis. Skull and upper cervical spine: Infiltrating destructive mass in the occipital bone bilaterally left greater than right with extension into the left temporal bone. Additional lesions in the frontal bone bilaterally and in the left parietal bone likely metastatic disease. Sinuses/Orbits: Mild mucosal edema paranasal sinuses. Negative orbit Other: None IMPRESSION: Destructive mass lesion in the occipital bone bilaterally left greater than right. There is associated soft tissue mass in the left occipital bone extending into the posterior fossa with mass-effect and mild edema of the left cerebellum. This mass extends into the left temporal bone. Additional bone lesions are present in the frontal bones and left parietal bone. Subtle enhancing lesions right frontal lobe measuring 10 mm and 4 mm. Findings compatible with metastatic disease. Electronically Signed   By: Franchot Gallo M.D.   On: 02/15/2021 13:47   US Renal  Result Date:  02/18/2021 CLINICAL DATA:  Back pain. Right renal pelvis fullness on right upper quadrant ultrasound performed 1 day prior EXAM: RENAL / URINARY TRACT ULTRASOUND COMPLETE COMPARISON:  02/17/2021 abdominal sonogram. FINDINGS: Right Kidney: Renal measurements: 10.3 x 4.2 x 4.2 cm = volume: 95 mL. Normal parenchymal echogenicity and thickness. Mild right hydronephrosis. Possible 5 mm right ureteropelvic junction stone. Nonobstructing 3 mm lower right renal stone. No renal  masses. Left Kidney: Renal measurements: 10.0 x 4.8 x 4.2 cm = volume: 107 mL. Normal parenchymal echogenicity and thickness. No left hydronephrosis. No renal masses. Probable nonobstructing 7 mm upper left renal stone. Bladder: Appears normal for degree of bladder distention. Bilateral ureteral jets seen in the bladder. Other: None. IMPRESSION: 1. Mild right hydronephrosis. Possible 5 mm right UPJ stone. Nonobstructing 3 mm lower right renal stone. Suggest unenhanced CT abdomen/pelvis for further evaluation. 2. Probable nonobstructing upper left renal stone. Electronically Signed   By: Ilona Sorrel M.D.   On: 02/18/2021 12:02   CT CHEST ABDOMEN PELVIS W CONTRAST  Result Date: 02/15/2021 CLINICAL DATA:  Altered mental status. 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 EXAM: CT CHEST, ABDOMEN, AND PELVIS WITH CONTRAST TECHNIQUE: Multidetector CT imaging of the chest, abdomen and pelvis was performed following the standard protocol during bolus administration of intravenous contrast. CONTRAST:  75 mL OMNIPAQUE IOHEXOL 300 MG/ML  SOLN COMPARISON:  None. FINDINGS: CT CHEST FINDINGS Cardiovascular: No significant vascular findings. Normal heart size. No pericardial effusion. Mediastinum/Nodes: The patient has extensive lymphadenopathy. A 2 cm left axillary node is seen on image 11 of series 2; a second left axillary node on image 14 measures 1.4 cm. A 1.1 cm subpectoral node is seen on the left on image 15 of series 2. A 1.4 cm left  supraclavicular node is seen on image 9. Right paratracheal node on image 15 measures 1.9 cm. Right precarinal nodal mass on image 25 measures 2.6 cm and there is a right hilar nodal mass measuring 4.2 x 3.1 cm on image 30. The esophagus and thyroid are negative. Lungs/Pleura: Small right pleural effusion. A spiculated nodule in the right middle lobe measures 2.6 cm transverse by 2.7 cm AP on image 77, series 4. Scattered pulmonary nodules include a 0.5 cm nodule in the superior segment of the left lower lobe on image 80, 0.4 cm left upper lobe nodule on image 76 and 2 punctate nodules on image 83. Additional smaller nodules are seen scattered through both lungs. The lungs are emphysematous. No pleural effusion. Musculoskeletal: A 0.9 cm lytic lesion is seen in T5, lytic lesions are seen in T7 and T9, larger in T9. There is also a small lytic lesion in T10. CT ABDOMEN PELVIS FINDINGS Hepatobiliary: Metastatic lesions are seen in the left hepatic lobe measuring 1.7 cm on image 57, series 2 and near the dome of the liver on image 50 of series 2 where a 1.1 cm lesion is identified. A second lesion near the dome of the liver in the right hepatic lobe on image 50 measures 0.7 cm. There is mild intra and extrahepatic biliary ductal dilatation likely related to prior cholecystectomy. Pancreas: Unremarkable. No pancreatic ductal dilatation or surrounding inflammatory changes. Spleen: Normal in size without focal abnormality. Adrenals/Urinary Tract: Adrenal glands are unremarkable. Kidneys are normal, without renal calculi, focal lesion, or hydronephrosis. Bladder is unremarkable. Stomach/Bowel: Stomach is within normal limits. Appendix appears normal. No evidence of bowel wall thickening, distention, or inflammatory changes. Moderately large colonic stool burden throughout noted. Vascular/Lymphatic: Aortic atherosclerosis. No aneurysm. No pathologic lymphadenopathy by CT size criteria. Reproductive: Status post  hysterectomy. No adnexal masses. Other: None. Musculoskeletal: Mottled appearance of the L2 vertebral body is consistent with metastatic disease. There is a mild pathologic superior endplate compression fracture of L2 with vertebral body height loss centrally of approximately 30%. IMPRESSION: Findings consistent with extensive metastatic carcinoma likely emanating from a 2.7 cm right middle lobe  pulmonary nodule. Metastases include extensive lymphadenopathy in the chest,, pulmonary nodule bone lesions and liver lesions. Mild pathologic fracture of L2 with vertebral body height loss centrally of up to approximately 30% is again noted. Aortic Atherosclerosis (ICD10-I70.0) and Emphysema (ICD10-J43.9). Electronically Signed   By: Inge Rise M.D.   On: 02/15/2021 14:25   DG Chest Port 1 View  Result Date: 02/27/2021 CLINICAL DATA:  Worsening shortness of breath since this morning. Recent diagnosis of lung cancer. EXAM: PORTABLE CHEST 1 VIEW COMPARISON:  Radiograph earlier today.  CT earlier today. FINDINGS: Stable heart size and mediastinal contours. Right hilar prominence and thickening of the lower paratracheal stripe related to known adenopathy. Unchanged right mid lung pulmonary nodule. Mild patchy bilateral suprahilar opacities corresponding to ground-glass opacity on CT earlier today. No significant change in the interim. No pneumothorax or large pleural effusion. Retained excreted IV contrast in the right renal collecting system with right hydronephrosis, partially included in the upper abdomen. IMPRESSION: 1. Stable radiographic appearance of the chest from earlier today. Similar patchy suprahilar opacities corresponding to ground-glass opacity on CT. 2. Right mid lung pulmonary nodule and thoracic adenopathy. 3. Partially included in the upper abdomen is retained excreted contrast in the right renal collecting system with right hydronephrosis. Right hydronephrosis was seen on renal ultrasound  02/18/2021. Electronically Signed   By: Keith Rake M.D.   On: 02/27/2021 15:04   DG Chest Portable 1 View  Result Date: 02/27/2021 CLINICAL DATA:  60 year old female with shortness of breath and cough. Metastatic lung cancer. EXAM: PORTABLE CHEST 1 VIEW COMPARISON:  02/15/2021 and prior studies FINDINGS: New hazy opacities within the central/upper lungs noted. RIGHT middle lobe mass and small scattered pulmonary nodules bilaterally are again identified. No pleural effusion or pneumothorax. No acute bony abnormalities are identified. IMPRESSION: New hazy opacities within the central/upper lungs which may represent edema or infection. Unchanged RIGHT middle lobe mass and bilateral pulmonary nodules compatible with known malignancy/metastases. Electronically Signed   By: Margarette Canada M.D.   On: 02/27/2021 08:49   [place] CORE BIOPSY (LYMPH NODES)  Result Date: 02/21/2021 INDICATION: Multiple enlarged lymph nodes, right middle lobe lung mass, liver lesions and bone lesions. EXAM: ULTRASOUND GUIDED CORE BIOPSY OF LEFT SUPRACLAVICULAR LYMPH NODE MEDICATIONS: None. ANESTHESIA/SEDATION: Versed 2.0 mg IV Moderate Sedation Time:  12 minutes. The patient was continuously monitored during the procedure by the interventional radiology nurse under my direct supervision. PROCEDURE: The procedure, risks, benefits, and alternatives were explained to the patient. Questions regarding the procedure were encouraged and answered. The patient understands and consents to the procedure. A time-out was performed prior to initiating the procedure. The left neck was prepped with chlorhexidine in a sterile fashion, and a sterile drape was applied covering the operative field. A sterile gown and sterile gloves were used for the procedure. Local anesthesia was provided with 1% Lidocaine. After localizing an enlarged left supraclavicular lymph node by ultrasound, multiple 18 gauge core biopsy samples were obtained. Core biopsy samples were  submitted in formalin as well as on saline soaked Telfa gauze. Additional ultrasound was performed. COMPLICATIONS: None immediate. FINDINGS: An enlarged left supraclavicular lymph node measures approximately 3.0 x 1.8 x 2.9 cm. Solid core biopsy samples were obtained. IMPRESSION: Ultrasound-guided core biopsy performed of an enlarged left supraclavicular lymph node measuring 3 cm in greatest dimensions. Electronically Signed   By: Aletta Edouard M.D.   On: 02/21/2021 15:31   CT Maxillofacial Wo Contrast  Result Date: 02/15/2021 CLINICAL DATA:  Headache with  left-sided face/neck/head pain. On antibiotics for reported laryngitis with bilateral ear pain. EXAM: CT HEAD WITHOUT CONTRAST CT MAXILLOFACIAL WITHOUT CONTRAST TECHNIQUE: Multidetector CT imaging of the head and maxillofacial structures were performed using the standard protocol without intravenous contrast. Multiplanar CT image reconstructions of the maxillofacial structures were also generated. COMPARISON:  None. FINDINGS: CT HEAD FINDINGS Brain: The calvarial/skull base mass lesion is described below. Spur this process exerts mass effect on the left cerebellum with suspected narrowing of the fourth ventricle. No evidence of hydrocephalus. No evidence of acute large vascular territory infarct. No acute hemorrhage. Mild to moderate scattered white matter hypodensities, which are nonspecific but most likely relate to chronic microvascular ischemic disease. No midline shift. Vascular: No hyperdense vessel identified. Calcific atherosclerosis. Skull/skull base: There is aggressive soft tissue mass lesion involving the left occipital calvarium measuring up to approximately 5.5 x 3.1 by 3.3 cm (transverse by AP by craniocaudal) with bony destruction and expansion and extension inferiorly to involve the left temporal bone and skull base. There is mass effect on the inferior left cerebellum and possible involvement of the subjacent dural venous sinuses. Small  left mastoid effusion. CT MAXILLOFACIAL FINDINGS Osseous: No fracture or mandibular dislocation. No destructive process in the face. Orbits: Negative. No traumatic or inflammatory finding. Sinuses: Small right maxillary sinus retention cyst. Otherwise, sinuses are clear. Soft tissues: Negative. 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 Resulting mass effect on the left cerebellum and possible involvement of the subjacent dural venous sinuses. 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 Osteomyelitis is a differential consideration. Recommend MRI brain with and without contrast to further evaluate. Findings and recommendations discussed with Ashok Cordia, PA via telephone at 11:20 a.m. Electronically Signed   By: Margaretha Sheffield MD   On: 02/15/2021 11:29   US Abdomen Limited RUQ (LIVER/GB)  Result Date: 02/17/2021 CLINICAL DATA:  Nausea and vomiting for 1 day. EXAM: ULTRASOUND ABDOMEN LIMITED RIGHT UPPER QUADRANT COMPARISON:  CT chest, abdomen and pelvis 02/15/2021. FINDINGS: Gallbladder: Removed. Common bile duct: Diameter: 0.8 cm. Liver: Three hypoechoic liver lesions measuring up to 1.8 cm are identified are identified and correlate with metastatic deposit seen on prior CT. Mild intrahepatic biliary ductal dilatation seen on CT is also noted. Portal vein is patent on color Doppler imaging with normal direction of blood flow towards the liver. Other: There is fullness of the right renal pelvis which is new since the patient's CT scan yesterday. IMPRESSION: Three hypoechoic liver lesions measuring up to 1.8 cm correlate with metastatic deposit seen on CT. Status post cholecystectomy. Mild intra and extrahepatic biliary ductal dilatation is likely related to cholecystectomy. No obstructing lesion is identified. Fullness of  the right renal pelvis is new since yesterday's examination. This could be incidental. Correlation with dedicated renal ultrasound could be used for further evaluation if indicated. Electronically Signed   By: Inge Rise M.D.   On: 02/17/2021 12:53    Assessment and plan-   #Shortness of breath, acute on chronic CT showed a new groundglass opacities throughout the lungs.  Covid negative. Agree with empirically treating HCAP.  Vancomycin and cefepime.  #Metastatic adenocarcinoma with extensive lymphadenopathy, liver lesion, bone lesion Discussed with patient and sister about the diagnosis.  She was supposed to follow-up with me tomorrow in the clinic to discuss about management plan.  We discussed about treatment  of her metastatic lung cancer will involve systemic chemotherapy and palliative radiation.  Her disease not curable.  No treatment until she is stabilized and recovers from HCAP. Consult palliative care service-I will note for [facility] in a.m.  #Neoplasm related pain, history of substance abuse.   Continue current outpatient pain regimen with fentanyl patch and Percocet  #Occipital lesion with mass-effect on the cerebellum with mild edema pathological fracture of L2 with multiple bone lesions. Patient has been on dexamethasone 4 mg twice daily.  I will continue for now.    Thank you for allowing me to participate in the care of this patient.   Earlie Server, MD, PhD Hematology Oncology [facility] at [facility] Pager- [phone] 02/27/2021

## 2021-02-27 NOTE — Progress Notes (Signed)
Pt arrived to 1C room 118 for continued medical treatment from the ED.  Pt is alert and oriented on room air with severe rhonchi, gurgling,  that can be heard at the doorway.  Sister at bedside.  Pt denies pain or discomfort.  Pt is on room air wearing her own clothes.  Shoes removed per her request.

## 2021-02-27 NOTE — Progress Notes (Signed)
PHARMACY -  BRIEF ANTIBIOTIC NOTE   Pharmacy has received consult(s) for vancomycin and cefepime from an ED provider.  The patient's profile has been reviewed for ht/wt/allergies/indication/available labs.    One time order(s) placed for:  1) vancomycin 1 gram IV x 1  2) cefepime 2 grams IV x 1  Further antibiotics/pharmacy consults should be ordered by admitting physician if indicated.                       Thank you, Dallie Piles 02/27/2021  8:52 AM

## 2021-02-27 NOTE — ED Provider Notes (Signed)
[facility] Emergency Department Provider Note ____________________________________________   Event Date/Time   First MD Initiated Contact with Patient 02/27/21 [phone]     (approximate)  I have reviewed the triage vital signs and the nursing notes.  HISTORY  Chief Complaint Shortness of Breath and Cough   HPI Brenda Middleton is a 60 y.o. femalewho presents to the ED for evaluation of shortness of breath.   Chart review indicates HTN, GERD, COPD, substance use disorder in remission. Recent medical admission 3/18-3/21 due to intractable pain associated with recently diagnosed widely metastatic malignancy.  Mets to occipital bone of the skull, extending mass-effect into the cerebellum bilaterally.  Significant thoracic lymphadenopathy. 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  Patient does to the ED for 2 days of aggressively worsening shortness of breath and cough.  She reports chills, "but I am always cold," and denies any fevers or night sweats.  She reports increased cough without significant increased sputum production, as well as shortness of breath and dyspnea on exertion.  She reports associated chest tightness.  She reports transient and mild improvement with her home inhalers, and she reports compliance with her typical prescription medications and inhalers.  Reports her pain is well controlled with the addition of Norco and fentanyl patches recently prescribed.  Denies syncope, falls or trauma.  Denies unilateral leg swelling.   Past Medical History:  Diagnosis Date  . Anxiety   . Benign neoplasm of cervix uteri   . Chronic hepatitis C without mention of hepatic coma   . Chronic low back pain 08/26/2014  . Constipation   . Depressive disorder   . Displacement of cervical intervertebral disc   . Hypertensive disorder   . Iron deficiency anemia due to chronic blood  loss 09/12/2019  . Palpitations   . Prolapsed cervical intervertebral disc     Patient Active Problem List   Diagnosis Date Noted  . Metastatic malignant neoplasm ([place])   . Intractable low back pain 02/18/2021  . Anxiety   . Depressive disorder   . Pathologic fracture of lumbar vertebra, initial encounter   . 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 COPD (chronic obstructive pulmonary disease) ([place])   . GERD (gastroesophageal reflux disease)   . Palliative care encounter   . Pathologic compression fracture of lumbar vertebra ([place])   . Goals of care, counseling/discussion   . Neoplasm related pain   . Iron deficiency anemia due to chronic blood loss 09/12/2019  . Family history of cancer 09/12/2019  . Blood in the stool 09/12/2019  . Elevated alkaline phosphatase level 09/12/2019  . Chronic low back pain 08/26/2014    Past Surgical History:  Procedure Laterality Date  . CONIZATION CERVIX    . DILATION AND CURETTAGE OF UTERUS    . HAND SURGERY  2008,2015   Carpel Tunnel  . TONSILLECTOMY    . VULVECTOMY      Prior to Admission medications   Medication Sig Start Date End Date Taking? Authorizing Provider  albuterol (VENTOLIN HFA) 108 (90 Base) MCG/ACT inhaler Inhale 2 puffs into the lungs every 4 (four) hours as needed for wheezing or shortness of breath. 02/21/21   Danford, Suann Larry, MD  amLODipine (NORVASC) 5 MG tablet Take 1 tablet (5 mg total) by mouth daily. 02/22/21   Danford, Suann Larry, MD  budesonide-formoterol (SYMBICORT) 80-4.5 MCG/ACT inhaler Inhale 2 puffs into the lungs 2 (two) times  daily.    [provider]  busPIRone (BUSPAR) 10 MG tablet Take 10 mg by mouth 2 (two) times daily. Patient is unsure of dose    [provider]  dexamethasone (DECADRON) 4 MG tablet Take 1 tablet (4 mg total) by mouth 2 (two) times daily. 02/21/21   Danford, Suann Larry, MD  feeding supplement (ENSURE ENLIVE / ENSURE PLUS) LIQD Take 237 mLs by mouth 3 (three)  times daily between meals. 02/21/21   Danford, Suann Larry, MD  fentaNYL (DURAGESIC) 50 MCG/HR Place 1 patch onto the skin every 3 (three) days for 6 days. 02/21/21 02/27/21  DanfordSuann Larry, MD  gabapentin (NEURONTIN) 300 MG capsule Take 300 mg by mouth daily. 02/04/21   [provider]  hydrochlorothiazide (HYDRODIURIL) 25 MG tablet Take 1 tablet (25 mg total) by mouth daily. 02/22/21   Danford, Suann Larry, MD  hydrOXYzine (ATARAX/VISTARIL) 25 MG tablet hydroxyzine HCl 25 mg tablet    [provider]  ondansetron (ZOFRAN-ODT) 4 MG disintegrating tablet Take 1 tablet (4 mg total) by mouth every 8 (eight) hours as needed. 02/21/21   Danford, Suann Larry, MD  oxyCODONE-acetaminophen (PERCOCET/ROXICET) 5-325 MG tablet Take 1-2 tablets by mouth every 4 (four) hours as needed for severe pain. 02/25/21   Borders, Kirt Boys, NP  pantoprazole (PROTONIX) 40 MG tablet Take 40 mg by mouth daily. 02/08/21   [provider]  prochlorperazine (COMPAZINE) 10 MG tablet Take 10 mg by mouth every 6 (six) hours as needed for nausea or vomiting.    [provider]  promethazine (PHENERGAN) 12.5 MG tablet Take 1 tablet (12.5 mg total) by mouth every 6 (six) hours as needed for nausea or vomiting. 02/17/21   Blake Divine, MD  QUEtiapine (SEROQUEL) 400 MG tablet Take 400 mg by mouth at bedtime.    [provider]  amitriptyline (ELAVIL) 25 MG tablet Take 2 tablets (50 mg total) by mouth at bedtime. Patient not taking: No sig reported 08/26/14 01/01/21  Kathrynn Ducking, MD  clonazePAM (KLONOPIN) 0.5 MG tablet Take 0.5 mg by mouth 2 (two) times daily.  01/01/21  [provider]  temazepam (RESTORIL) 30 MG capsule Take 30 mg by mouth at bedtime.  01/01/21  [provider]    Allergies Penicillin g, Penicillin v, and Penicillins  Family History  Problem Relation Age of Onset  . Breast cancer Mother 52  . Lung cancer Father   . Cancer Brother   . Cancer  Other   . Stroke Other     Social History Social History   Tobacco Use  . Smoking status: Former Smoker    Packs/day: 0.70    Years: 35.00    Pack years: 24.50    Types: Cigarettes  . Smokeless tobacco: Never Used  Vaping Use  . Vaping Use: Never used  Substance Use Topics  . Alcohol use: Yes    Comment: occasional wine  . Drug use: No    Review of Systems  Constitutional: No fever.  Positive for chills Eyes: No visual changes. ENT: No sore throat. Cardiovascular: Positive for diffuse chest tightness Respiratory: Positive for shortness of breath and cough. Gastrointestinal: No abdominal pain.  No nausea, no vomiting.  No diarrhea.  No constipation. Genitourinary: Negative for dysuria. Musculoskeletal: Negative for back pain.  Positive for diffuse pain, well controlled Skin: Negative for rash. Neurological: Negative for headaches, focal weakness or numbness. ____________________________________________   PHYSICAL EXAM:  VITAL SIGNS: Vitals:   02/27/21 0900 02/27/21 1039  BP: (!) 166/90 (!) 166/98  Pulse: (!) 102 (!) 109  Resp: 13 13  Temp:    SpO2: 94% 90%    Constitutional: Alert and oriented.  Chronically ill-appearing without distress.  Speaking with a soft voice in phrases only. Eyes: Conjunctivae are normal. PERRL. EOMI. Head: Atraumatic. Nose: No congestion/rhinnorhea. Mouth/Throat: Mucous membranes are moist.  Oropharynx non-erythematous. Neck: No stridor. No cervical spine tenderness to palpation. Cardiovascular: Tachycardic rate, regular rhythm. Grossly normal heart sounds.  Good peripheral circulation. Respiratory: Tachypneic to the mid 20s.  No retractions.  Diffuse expiratory wheezes, rhonchorous breath sounds to left-sided lung fields.  Mildly decreased air movement throughout Gastrointestinal: Soft , nondistended, nontender to palpation. No CVA tenderness. Musculoskeletal: No lower extremity tenderness.  No joint effusions. No signs of acute  trauma. Neurologic:  Normal speech and language. No gross focal neurologic deficits are appreciated.  Skin:  Skin is warm, dry and intact. No rash noted. Psychiatric: Mood and affect are normal. Speech and behavior are normal. ____________________________________________   LABS (all labs ordered are listed, but only abnormal results are displayed)  Labs Reviewed  COMPREHENSIVE METABOLIC PANEL - Abnormal; Notable for the following components:      Result Value   Glucose, Bld 164 (*)    BUN 21 (*)    Calcium 8.3 (*)    Albumin 3.3 (*)    Alkaline Phosphatase 312 (*)    All other components within normal limits  CBC WITH DIFFERENTIAL/PLATELET - Abnormal; Notable for the following components:   WBC 23.6 (*)    RBC 3.74 (*)    Hemoglobin 10.7 (*)    HCT 32.4 (*)    RDW 15.6 (*)    Neutro Abs 20.5 (*)    Monocytes Absolute 1.9 (*)    Abs Immature Granulocytes 0.18 (*)    All other components within normal limits  BRAIN NATRIURETIC PEPTIDE - Abnormal; Notable for the following components:   B Natriuretic Peptide 127.2 (*)    All other components within normal limits  TROPONIN I (HIGH SENSITIVITY) - Abnormal; Notable for the following components:   Troponin I (High Sensitivity) 50 (*)    All other components within normal limits  RESP PANEL BY RT-PCR (FLU A&B, COVID) ARPGX2  CULTURE, BLOOD (SINGLE)  MAGNESIUM  LACTIC ACID, PLASMA  TROPONIN I (HIGH SENSITIVITY)   ____________________________________________  12 Lead EKG  Sinus rhythm, rate of 116 bpm.  Normal axis and normal intervals.  Biphasic P wave suggestive of biatrial enlargement.  No evidence of acute ischemia.  Sinus tachycardia. ____________________________________________  RADIOLOGY  ED MD interpretation: 1 view CXR reviewed by me with left-sided patchy opacities concerning for CAP.  CTA chest reviewed by me without evidence of acute PE.  Official radiology report(s): CT Angio Chest PE W and/or Wo  Contrast  Result Date: 02/27/2021 CLINICAL DATA:  PE suspected, widely metastatic lung cancer EXAM: CT ANGIOGRAPHY CHEST WITH CONTRAST TECHNIQUE: Multidetector CT imaging of the chest was performed using the standard protocol during bolus administration of intravenous contrast. Multiplanar CT image reconstructions and MIPs were obtained to evaluate the vascular anatomy. CONTRAST:  56mL OMNIPAQUE IOHEXOL 350 MG/ML SOLN COMPARISON:  02/15/2021 FINDINGS: Cardiovascular: Satisfactory opacification of the pulmonary arteries to the segmental level. No evidence of pulmonary embolism. Cardiomegaly. Left and right coronary artery calcifications and stents. No pericardial effusion. Mediastinum/Nodes: Redemonstrated, very extensive bulky bilateral hilar, mediastinal, supraclavicular, and left axillary lymphadenopathy. Thyroid gland, trachea, and esophagus demonstrate no significant findings. Lungs/Pleura: Diffuse bilateral bronchial wall thickening. Spiculated  right upper lobe mass as seen on prior examination (series 7, image 42). Numerous redemonstrated small pulmonary nodules throughout the lungs. There is new, extensive ground-glass airspace opacity throughout the lungs, somewhat geographic in the upper lobes (series 7, image 33), although somewhat more nodular appearing in the lower lungs (series 7, image 58). No pleural effusion or pneumothorax. Upper Abdomen: No acute abnormality. Multiple low-attenuation lesions of liver, better assessed by prior dedicated of the abdomen. Musculoskeletal: No chest wall abnormality. Multiple osseous metastatic lesions, most significantly a lytic lesion of the T9 vertebral body (series 9, image 58). Review of the MIP images confirms the above findings. IMPRESSION: 1. Negative examination for pulmonary embolism. 2. There is new, extensive ground-glass airspace opacity throughout the lungs, somewhat geographic in the upper lobes, although somewhat more nodular appearing in the lower  lungs. Findings are nonspecific and infectious or inflammatory, differential considerations primarily include drug toxicity and atypical/viral infection. 3. Diffuse bilateral bronchial wall thickening, consistent with nonspecific infectious or inflammatory bronchitis. 4. Redemonstrated findings of advanced metastatic lung malignancy, better assessed by recent prior staging examination. 5. Coronary artery disease. Electronically Signed   By: Eddie Candle M.D.   On: 02/27/2021 10:49   DG Chest Portable 1 View  Result Date: 02/27/2021 CLINICAL DATA:  60 year old female with shortness of breath and cough. Metastatic lung cancer. EXAM: PORTABLE CHEST 1 VIEW COMPARISON:  02/15/2021 and prior studies FINDINGS: New hazy opacities within the central/upper lungs noted. RIGHT middle lobe mass and small scattered pulmonary nodules bilaterally are again identified. No pleural effusion or pneumothorax. No acute bony abnormalities are identified. IMPRESSION: New hazy opacities within the central/upper lungs which may represent edema or infection. Unchanged RIGHT middle lobe mass and bilateral pulmonary nodules compatible with known malignancy/metastases. Electronically Signed   By: Margarette Canada M.D.   On: 02/27/2021 08:49    ____________________________________________   PROCEDURES and INTERVENTIONS  Procedure(s) performed (including Critical Care):  .1-3 Lead EKG Interpretation Performed by: Vladimir Crofts, MD Authorized by: Vladimir Crofts, MD     Interpretation: abnormal     ECG rate:  115   ECG rate assessment: tachycardic     Rhythm: sinus tachycardia     Ectopy: none     Conduction: normal   .Critical Care Performed by: Vladimir Crofts, MD Authorized by: Vladimir Crofts, MD   Critical care provider statement:    Critical care time (minutes):  35   Critical care was necessary to treat or prevent imminent or life-threatening deterioration of the following conditions:  Sepsis   Critical care was time spent  personally by me on the following activities:  Discussions with consultants, evaluation of patient's response to treatment, examination of patient, ordering and performing treatments and interventions, ordering and review of laboratory studies, ordering and review of radiographic studies, pulse oximetry, re-evaluation of patient's condition, obtaining history from patient or surrogate and review of old charts    Medications  lactated ringers infusion (0 mLs Intravenous Paused 02/27/21 1033)  ipratropium-albuterol (DUONEB) 0.5-2.5 (3) MG/3ML nebulizer solution 6 mL (6 mLs Nebulization Given 02/27/21 0911)  lactated ringers bolus 1,000 mL (0 mLs Intravenous Stopped 02/27/21 1029)    And  lactated ringers bolus 500 mL (500 mLs Intravenous New Bag/Given 02/27/21 1033)    And  lactated ringers bolus 250 mL (250 mLs Intravenous New Bag/Given 02/27/21 1033)  vancomycin (VANCOCIN) IVPB 1000 mg/200 mL premix (0 mg Intravenous Stopped 02/27/21 1029)  ceFEPIme (MAXIPIME) 2 g in sodium chloride 0.9 % 100 mL IVPB (0  g Intravenous Stopped 02/27/21 1029)  iohexol (OMNIPAQUE) 350 MG/ML injection 75 mL (75 mLs Intravenous Contrast Given 02/27/21 0957)    ____________________________________________   MDM / ED COURSE   60 year old woman with known widely metastatic lung cancer presents to the ED with increased cough and shortness of breath with evidence of pneumonia consistent with HCAP considering her recent admissions, and with evidence of sepsis necessitating medical admission.  Persistently tachycardic but afebrile and not hypoxic on room air.  Remains hemodynamically stable.  Exam with stigmata of COPD exacerbation with diffuse expiratory wheezes and tachypnea.  Rhonchorous breath sounds noted to her left-sided lung fields.  She has no significant distress, signs of trauma or any neurovascular deficits.  No stigmata of DVT noted.  EKG is nonischemic and a sinus tach.  CXR demonstrates no opacities concerning for  pneumonia, for which sepsis protocols were followed and HCAP coverage provided.  Lactic acid is low and no evidence of severe sepsis or septic shock.  CTA chest obtained to evaluate for acute PE considering her high pretest probability, and this returned negative and further elucidates her infiltrates concerning for pneumonia.  Heart rate improving with fluid resuscitation.  We will discussed the case with hospitalist medicine for admission.  Clinical Course as of 02/27/21 1108  Sun Feb 27, 2021  0853 CXR reviewed with infiltrates. WBC elevated. Sepsis ordered added [DS]  0917 Educated patient on diagnosis of pneumonia and sepsis.  We discussed CT imaging.  She is agreeable. [DS]  8811 [id number].  Patient reports improving symptoms after breathing treatment and is requesting food. I educate patient of reassuring CT without evidence of PE, and reaffirming diagnosis of pneumonia.  We discussed medical admission. [DS]    Clinical Course User Index [DS] Vladimir Crofts, MD    ____________________________________________   FINAL CLINICAL IMPRESSION(S) / ED DIAGNOSES  Final diagnoses:  HCAP (healthcare-associated pneumonia)  COPD exacerbation ([place])  Shortness of breath  Cough     ED Discharge Orders    None       Shadiyah Wernli   Note:  This document was prepared using Dragon voice recognition software and may include unintentional dictation errors.   Vladimir Crofts, MD 02/27/21 [phone]

## 2021-02-27 NOTE — H&P (Signed)
History and Physical    Brenda Middleton SWH:[id number] DOB: 06-14-1961 DOA: 02/27/2021  Referring MD/NP/PA:   PCP: Remi Haggard, FNP   Patient coming from:  The patient is coming from home.  At baseline, pt is independent for most of ADL.        Chief Complaint: SOB  HPI: Brenda Middleton is a 60 y.o. female with medical history significant of recently diagnosed metastasized to lung cancer, hypertension, COPD, GERD, depression with anxiety, anemia, HCV, who presents with shortness breath.  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  Patient is currently being worked up by oncologist, Dr. Tasia Catchings.    Per pt and her sister at bed side, pt has been having shortness breath and cough for about 1 week, which has worsened since this morning.  Patient has hoarse voice, which is normal to her.  Patient has chest tightness, denies fever. She has shaking and chills.  No nausea, vomiting, diarrhea or abdominal pain, no symptoms of UTI.  Patient is drowsy, but arousable.  She answered most of the questions appropriately.  No unilateral weakness.  No facial droop or slurred speech.   ED Course: pt was found to have WBC 23.6, lactic acid 1.6, troponin level 50, 42, negative Covid PCR, electrolytes renal function okay, temperature normal, blood pressure 151/78, heart rate 127, 111, RR 23, oxygen saturation 90%, which improved to 96% on 2 L oxygen.  Chest x-ray has new hazy opacity bilaterally.  CT angiograms negative for PE, but showed extensive groundglass opacity.  Patient is admitted to [place] bed as inpatient.    Review of Systems:   General: no fevers, has chills, no body weight gain, has fatigue HEENT: no blurry vision, hearing changes or sore throat Respiratory: has dyspnea, coughing, no wheezing CV: no chest pain, no palpitations GI: no nausea, vomiting, abdominal pain, diarrhea, constipation GU: no dysuria,  burning on urination, increased urinary frequency, hematuria  Ext: no leg edema Neuro: no unilateral weakness, numbness, or tingling, no vision change or hearing loss. Has drowsiness Skin: no rash, no skin tear. MSK: No muscle spasm, no deformity, no limitation of range of movement in spin Heme: No easy bruising.  Travel history: No recent long distant travel.  Allergy:  Allergies  Allergen Reactions  . Penicillin G Hives    Other reaction(s): HIVES Other reaction(s): HIVES  . Penicillin V Itching  . Penicillins     Past Medical History:  Diagnosis Date  . Anxiety   . Benign neoplasm of cervix uteri   . Chronic hepatitis C without mention of hepatic coma   . Chronic low back pain 08/26/2014  . Constipation   . Depressive disorder   . Displacement of cervical intervertebral disc   . Hypertensive disorder   . Iron deficiency anemia due to chronic blood loss 09/12/2019  . Palpitations   . Prolapsed cervical intervertebral disc     Past Surgical History:  Procedure Laterality Date  . CONIZATION CERVIX    . DILATION AND CURETTAGE OF UTERUS    . HAND SURGERY  2008,2015   Carpel Tunnel  . TONSILLECTOMY    . VULVECTOMY      Social History:  reports that she has quit smoking. Her smoking use included cigarettes. She has a 24.50 pack-year smoking history. She has never used smokeless tobacco. She reports current alcohol use. She reports that she does not use drugs.  Family History:  Family History  Problem Relation Age of Onset  . Breast cancer Mother 21  . Lung cancer Father   . Cancer Brother   . Cancer Other   . Stroke Other      Prior to Admission medications   Medication Sig Start Date End Date Taking? Authorizing Provider  albuterol (VENTOLIN HFA) 108 (90 Base) MCG/ACT inhaler Inhale 2 puffs into the lungs every 4 (four) hours as needed for wheezing or shortness of breath. 02/21/21   Danford, Suann Larry, MD  amLODipine (NORVASC) 5 MG tablet Take 1 tablet (5 mg  total) by mouth daily. 02/22/21   Danford, Suann Larry, MD  budesonide-formoterol (SYMBICORT) 80-4.5 MCG/ACT inhaler Inhale 2 puffs into the lungs 2 (two) times daily.    [provider]  busPIRone (BUSPAR) 10 MG tablet Take 10 mg by mouth 2 (two) times daily. Patient is unsure of dose    [provider]  dexamethasone (DECADRON) 4 MG tablet Take 1 tablet (4 mg total) by mouth 2 (two) times daily. 02/21/21   Danford, Suann Larry, MD  feeding supplement (ENSURE ENLIVE / ENSURE PLUS) LIQD Take 237 mLs by mouth 3 (three) times daily between meals. 02/21/21   Danford, Suann Larry, MD  fentaNYL (DURAGESIC) 50 MCG/HR Place 1 patch onto the skin every 3 (three) days for 6 days. 02/21/21 02/27/21  DanfordSuann Larry, MD  gabapentin (NEURONTIN) 300 MG capsule Take 300 mg by mouth daily. 02/04/21   [provider]  hydrochlorothiazide (HYDRODIURIL) 25 MG tablet Take 1 tablet (25 mg total) by mouth daily. 02/22/21   Danford, Suann Larry, MD  hydrOXYzine (ATARAX/VISTARIL) 25 MG tablet hydroxyzine HCl 25 mg tablet    [provider]  ondansetron (ZOFRAN-ODT) 4 MG disintegrating tablet Take 1 tablet (4 mg total) by mouth every 8 (eight) hours as needed. 02/21/21   Danford, Suann Larry, MD  oxyCODONE-acetaminophen (PERCOCET/ROXICET) 5-325 MG tablet Take 1-2 tablets by mouth every 4 (four) hours as needed for severe pain. 02/25/21   Borders, Kirt Boys, NP  pantoprazole (PROTONIX) 40 MG tablet Take 40 mg by mouth daily. 02/08/21   [provider]  prochlorperazine (COMPAZINE) 10 MG tablet Take 10 mg by mouth every 6 (six) hours as needed for nausea or vomiting.    [provider]  promethazine (PHENERGAN) 12.5 MG tablet Take 1 tablet (12.5 mg total) by mouth every 6 (six) hours as needed for nausea or vomiting. 02/17/21   Blake Divine, MD  QUEtiapine (SEROQUEL) 400 MG tablet Take 400 mg by mouth at bedtime.    [provider]  amitriptyline (ELAVIL) 25  MG tablet Take 2 tablets (50 mg total) by mouth at bedtime. Patient not taking: No sig reported 08/26/14 01/01/21  Kathrynn Ducking, MD  clonazePAM (KLONOPIN) 0.5 MG tablet Take 0.5 mg by mouth 2 (two) times daily.  01/01/21  [provider]  temazepam (RESTORIL) 30 MG capsule Take 30 mg by mouth at bedtime.  01/01/21  [provider]    Physical Exam: Vitals:   02/27/21 1039 02/27/21 1100 02/27/21 1130 02/27/21 1316  BP: (!) 166/98 (!) 151/78 (!) 152/97 (!) 175/98  Pulse: (!) 109 (!) 111 (!) 119 (!) 120  Resp: 13 (!) 23 19 16   Temp:    98.7 F (37.1 C)  TempSrc:    Oral  SpO2: 90% 91% 96% (!) 86%  Weight:      Height:       General: Not in acute distress HEENT:  Eyes: PERRL, EOMI, no scleral icterus.       ENT: No discharge from the ears and nose       Neck: No JVD, no bruit, no mass felt. Heme: No neck lymph node enlargement. Cardiac: S1/S2, RRR, No murmurs, No gallops or rubs. Respiratory: Has diffused crackles bilaterally GI: Soft, nondistended, nontender, no organomegaly, BS present. GU: No hematuria Ext: No pitting leg edema bilaterally. 1+DP/PT pulse bilaterally. Musculoskeletal: No joint deformities, No joint redness or warmth, no limitation of ROM in spin. Skin: No rashes.  Neuro: Drowsy, arousable, when aroused, patient is oriented X3, cranial nerves II-XII grossly intact, moves all extremities normally. Psych: Patient is not psychotic, no suicidal or hemocidal ideation.  Labs on Admission: I have personally reviewed following labs and imaging studies  CBC: Recent Labs  Lab 02/27/21 0819  WBC 23.6*  NEUTROABS 20.5*  HGB 10.7*  HCT 32.4*  MCV 86.6  PLT 597   Basic Metabolic Panel: Recent Labs  Lab 02/21/21 0418 02/27/21 0819  NA 137 137  K 4.0 3.8  CL 104 102  CO2 24 25  GLUCOSE 157* 164*  BUN 17 21*  CREATININE 0.77 0.68  CALCIUM 9.0 8.3*  MG  --  2.0   GFR: Estimated Creatinine Clearance: 61.2 mL/min (by C-G formula based  on SCr of 0.68 mg/dL). Liver Function Tests: Recent Labs  Lab 02/21/21 0418 02/27/21 0819  AST 22 29  ALT 37 32  ALKPHOS 379* 312*  BILITOT 0.6 0.5  PROT 6.5 6.8  ALBUMIN 3.2* 3.3*   No results for input(s): LIPASE, AMYLASE in the last 168 hours. Recent Labs  Lab 02/27/21 1243  AMMONIA 17   Coagulation Profile: No results for input(s): INR, PROTIME in the last 168 hours. Cardiac Enzymes: No results for input(s): CKTOTAL, CKMB, CKMBINDEX, TROPONINI in the last 168 hours. BNP (last 3 results) No results for input(s): PROBNP in the last 8760 hours. HbA1C: No results for input(s): HGBA1C in the last 72 hours. CBG: No results for input(s): GLUCAP in the last 168 hours. Lipid Profile: No results for input(s): CHOL, HDL, LDLCALC, TRIG, CHOLHDL, LDLDIRECT in the last 72 hours. Thyroid Function Tests: No results for input(s): TSH, T4TOTAL, FREET4, T3FREE, THYROIDAB in the last 72 hours. Anemia Panel: No results for input(s): VITAMINB12, FOLATE, FERRITIN, TIBC, IRON, RETICCTPCT in the last 72 hours. Urine analysis:    Component Value Date/Time   COLORURINE YELLOW (A) 02/18/2021 0924   APPEARANCEUR HAZY (A) 02/18/2021 0924   APPEARANCEUR Clear 11/04/2013 1649   LABSPEC 1.015 02/18/2021 0924   LABSPEC 1.009 11/04/2013 1649   PHURINE 6.0 02/18/2021 0924   GLUCOSEU NEGATIVE 02/18/2021 0924   GLUCOSEU Negative 11/04/2013 1649   HGBUR SMALL (A) 02/18/2021 0924   BILIRUBINUR NEGATIVE 02/18/2021 0924   BILIRUBINUR Negative 11/04/2013 1649   KETONESUR NEGATIVE 02/18/2021 0924   PROTEINUR NEGATIVE 02/18/2021 0924   NITRITE NEGATIVE 02/18/2021 0924   LEUKOCYTESUR NEGATIVE 02/18/2021 0924   LEUKOCYTESUR Negative 11/04/2013 1649   Sepsis Labs: @LABRCNTIP (procalcitonin:4,lacticidven:4) ) Recent Results (from the past 240 hour(s))  Resp Panel by RT-PCR (Flu A&B, Covid) Nasopharyngeal Swab     Status: None   Collection Time: 02/18/21  9:24 AM   Specimen: Nasopharyngeal Swab;  Nasopharyngeal(NP) swabs in vial transport medium  Result Value Ref Range Status   SARS Coronavirus 2 by RT PCR NEGATIVE NEGATIVE Final    Comment: (NOTE) SARS-CoV-2 target nucleic acids are NOT DETECTED.  The SARS-CoV-2 RNA is generally detectable in upper respiratory specimens during  the acute phase of infection. The lowest concentration of SARS-CoV-2 viral copies this assay can detect is 138 copies/mL. A negative result does not preclude SARS-Cov-2 infection and should not be used as the sole basis for treatment or other patient management decisions. A negative result may occur with  improper specimen collection/handling, submission of specimen other than nasopharyngeal swab, presence of viral mutation(s) within the areas targeted by this assay, and inadequate number of viral copies(<138 copies/mL). A negative result must be combined with clinical observations, patient history, and epidemiological information. The expected result is Negative.  Fact Sheet for Patients:  EntrepreneurPulse.com.au  Fact Sheet for Healthcare Providers:  IncredibleEmployment.be  This test is no t yet approved or cleared by the [place] FDA and  has been authorized for detection and/or diagnosis of SARS-CoV-2 by FDA under an Emergency Use Authorization (EUA). This EUA will remain  in effect (meaning this test can be used) for the duration of the COVID-19 declaration under Section 564(b)(1) of the Act, 21 U.S.C.section 360bbb-3(b)(1), unless the authorization is terminated  or revoked sooner.       Influenza A by PCR NEGATIVE NEGATIVE Final   Influenza B by PCR NEGATIVE NEGATIVE Final    Comment: (NOTE) The Xpert Xpress SARS-CoV-2/FLU/RSV plus assay is intended as an aid in the diagnosis of influenza from Nasopharyngeal swab specimens and should not be used as a sole basis for treatment. Nasal washings and aspirates are unacceptable for Xpert Xpress  SARS-CoV-2/FLU/RSV testing.  Fact Sheet for Patients: EntrepreneurPulse.com.au  Fact Sheet for Healthcare Providers: IncredibleEmployment.be  This test is not yet approved or cleared by the [place] FDA and has been authorized for detection and/or diagnosis of SARS-CoV-2 by FDA under an Emergency Use Authorization (EUA). This EUA will remain in effect (meaning this test can be used) for the duration of the COVID-19 declaration under Section 564(b)(1) of the Act, 21 U.S.C. section 360bbb-3(b)(1), unless the authorization is terminated or revoked.  Performed at [facility], [address] [place], [place] [address]   MRSA PCR Screening     Status: None   Collection Time: 02/19/21  4:22 PM   Specimen: Nasopharyngeal  Result Value Ref Range Status   MRSA by PCR NEGATIVE NEGATIVE Final    Comment:        The GeneXpert MRSA Assay (FDA approved for NASAL specimens only), is one component of a comprehensive MRSA colonization surveillance program. It is not intended to diagnose MRSA infection nor to guide or monitor treatment for MRSA infections. Performed at [facility], [address] [place], [place] [address]   Resp Panel by RT-PCR (Flu A&B, Covid) Nasopharyngeal Swab     Status: None   Collection Time: 02/27/21  9:07 AM   Specimen: Nasopharyngeal Swab; Nasopharyngeal(NP) swabs in vial transport medium  Result Value Ref Range Status   SARS Coronavirus 2 by RT PCR NEGATIVE NEGATIVE Final    Comment: (NOTE) SARS-CoV-2 target nucleic acids are NOT DETECTED.  The SARS-CoV-2 RNA is generally detectable in upper respiratory specimens during the acute phase of infection. The lowest concentration of SARS-CoV-2 viral copies this assay can detect is 138 copies/mL. A negative result does not preclude SARS-Cov-2 infection and should not be used as the sole basis for treatment or other patient management decisions. A  negative result may occur with  improper specimen collection/handling, submission of specimen other than nasopharyngeal swab, presence of viral mutation(s) within the areas targeted by this assay, and inadequate number of viral copies(<138  copies/mL). A negative result must be combined with clinical observations, patient history, and epidemiological information. The expected result is Negative.  Fact Sheet for Patients:  EntrepreneurPulse.com.au  Fact Sheet for Healthcare Providers:  IncredibleEmployment.be  This test is no t yet approved or cleared by the [place] FDA and  has been authorized for detection and/or diagnosis of SARS-CoV-2 by FDA under an Emergency Use Authorization (EUA). This EUA will remain  in effect (meaning this test can be used) for the duration of the COVID-19 declaration under Section 564(b)(1) of the Act, 21 U.S.C.section 360bbb-3(b)(1), unless the authorization is terminated  or revoked sooner.       Influenza A by PCR NEGATIVE NEGATIVE Final   Influenza B by PCR NEGATIVE NEGATIVE Final    Comment: (NOTE) The Xpert Xpress SARS-CoV-2/FLU/RSV plus assay is intended as an aid in the diagnosis of influenza from Nasopharyngeal swab specimens and should not be used as a sole basis for treatment. Nasal washings and aspirates are unacceptable for Xpert Xpress SARS-CoV-2/FLU/RSV testing.  Fact Sheet for Patients: EntrepreneurPulse.com.au  Fact Sheet for Healthcare Providers: IncredibleEmployment.be  This test is not yet approved or cleared by the [place] FDA and has been authorized for detection and/or diagnosis of SARS-CoV-2 by FDA under an Emergency Use Authorization (EUA). This EUA will remain in effect (meaning this test can be used) for the duration of the COVID-19 declaration under Section 564(b)(1) of the Act, 21 U.S.C. section 360bbb-3(b)(1), unless the authorization  is terminated or revoked.  Performed at [facility], [address] [place], [place] [address]      Radiological Exams on Admission: CT Angio Chest PE W and/or Wo Contrast  Result Date: 02/27/2021 CLINICAL DATA:  PE suspected, widely metastatic lung cancer EXAM: CT ANGIOGRAPHY CHEST WITH CONTRAST TECHNIQUE: Multidetector CT imaging of the chest was performed using the standard protocol during bolus administration of intravenous contrast. Multiplanar CT image reconstructions and MIPs were obtained to evaluate the vascular anatomy. CONTRAST:  71mL OMNIPAQUE IOHEXOL 350 MG/ML SOLN COMPARISON:  02/15/2021 FINDINGS: Cardiovascular: Satisfactory opacification of the pulmonary arteries to the segmental level. No evidence of pulmonary embolism. Cardiomegaly. Left and right coronary artery calcifications and stents. No pericardial effusion. Mediastinum/Nodes: Redemonstrated, very extensive bulky bilateral hilar, mediastinal, supraclavicular, and left axillary lymphadenopathy. Thyroid gland, trachea, and esophagus demonstrate no significant findings. Lungs/Pleura: Diffuse bilateral bronchial wall thickening. Spiculated right upper lobe mass as seen on prior examination (series 7, image 42). Numerous redemonstrated small pulmonary nodules throughout the lungs. There is new, extensive ground-glass airspace opacity throughout the lungs, somewhat geographic in the upper lobes (series 7, image 33), although somewhat more nodular appearing in the lower lungs (series 7, image 58). No pleural effusion or pneumothorax. Upper Abdomen: No acute abnormality. Multiple low-attenuation lesions of liver, better assessed by prior dedicated of the abdomen. Musculoskeletal: No chest wall abnormality. Multiple osseous metastatic lesions, most significantly a lytic lesion of the T9 vertebral body (series 9, image 58). Review of the MIP images confirms the above findings. IMPRESSION: 1. Negative examination for pulmonary  embolism. 2. There is new, extensive ground-glass airspace opacity throughout the lungs, somewhat geographic in the upper lobes, although somewhat more nodular appearing in the lower lungs. Findings are nonspecific and infectious or inflammatory, differential considerations primarily include drug toxicity and atypical/viral infection. 3. Diffuse bilateral bronchial wall thickening, consistent with nonspecific infectious or inflammatory bronchitis. 4. Redemonstrated findings of advanced metastatic lung malignancy, better assessed by recent prior staging examination. 5. Coronary artery disease. Electronically  Signed   By: Eddie Candle M.D.   On: 02/27/2021 10:49   DG Chest Portable 1 View  Result Date: 02/27/2021 CLINICAL DATA:  60 year old female with shortness of breath and cough. Metastatic lung cancer. EXAM: PORTABLE CHEST 1 VIEW COMPARISON:  02/15/2021 and prior studies FINDINGS: New hazy opacities within the central/upper lungs noted. RIGHT middle lobe mass and small scattered pulmonary nodules bilaterally are again identified. No pleural effusion or pneumothorax. No acute bony abnormalities are identified. IMPRESSION: New hazy opacities within the central/upper lungs which may represent edema or infection. Unchanged RIGHT middle lobe mass and bilateral pulmonary nodules compatible with known malignancy/metastases. Electronically Signed   By: Margarette Canada M.D.   On: 02/27/2021 08:49     EKG: I have personally reviewed.  Sinus rhythm, QTC 466, bilateral atrial enlargement, LAD, poor R wave progression  Assessment/Plan Principal Problem:   HCAP (healthcare-associated pneumonia) Active Problems:   COPD (chronic obstructive pulmonary disease) (HCC)   GERD (gastroesophageal reflux disease)   Pathologic compression fracture of lumbar vertebra (HCC)   HTN (hypertension)   Depression with anxiety   Normocytic anemia   Sepsis (HCC)   Protein-calorie malnutrition, moderate (HCC)   Lung cancer (HCC)    Elevated troponin   Acute metabolic encephalopathy   Brain metastases ([place])   Sepsis due to HCAP (healthcare-associated pneumonia): Patient meets criteria for sepsis with leukocytosis, tachycardia with heart rate of 127, tachypnea with RR 23. Lactic acid is normal.  Her leukocytosis is partially due to Decadron use.  Currently hemodynamically stable.   - Will admit to med-surg bed as inpt -- IV Vancomycin and cefepime, - Mucinex for cough  - Bronchodilators - Urine legionella and S. pneumococcal antigen - Follow up blood culture x2, sputum culture - will get Procalcitonin  - IVF: 1.75L of LR bolus in ED -Deep suction  COPD (chronic obstructive pulmonary disease) (HCC) -Bronchodilators as above  Lung cancer with brain and skull metastases: pt is currently being worked up by Dr. Tasia Catchings of oncology -Consulted Dr. Tasia Catchings -Consult palliative care -pt is on Decadron 4 mg twice a day -Gave 1 dose of Solu-Cortef 50 mg as stress dose -Will give 500 mg of Keppra x1 to prevent seizure  GERD (gastroesophageal reflux disease) -Protonix  Pathologic compression fracture of lumbar vertebra (HCC) -Continue home fentanyl patch and as needed Percocet  HTN (hypertension) -Hold oral blood pressure medications, amlodipine, HCTZ since patient at high risk of developing hypotension due to sepsis -IV hydralazine as needed  Depression with anxiety -Continue home BuSpar, hydroxyzine, Seroquel  Normocytic anemia: Hemoglobin 10.7 -Follow-up with CBC  Protein-calorie malnutrition, moderate (HCC) -Ensure  Elevated troponin: Troponin 50, 42, no chest pain.  Likely due to demand ischemia. -Trend troponin -Check A1c, FLP -Aspirin 81 mg daily  Acute metabolic encephalopathy: Likely multifactorial etiology including metastasized to brain disease, sepsis, pneumonia. -Frequent neuro check -Check ammonia level due to history of hepatitis C         DVT ppx: SQ Lovenox Code Status: Full code (I  discussed CODE STATUS with patient in the presence of her sister, explained the meaning of CODE STATUS, patient wants to be full code) Family Communication: Yes, patient's   sister at bed side Disposition Plan:  Anticipate discharge back to previous environment Consults called:  Dr. Tasia Catchings of oncology Admission status and Level of care: Med-Surg:    as inpt      Status is: Inpatient  Remains inpatient appropriate because:Inpatient level of care appropriate due to severity of  illness   Dispo: The patient is from: Home              Anticipated d/c is to: Home              Patient currently is not medically stable to d/c.   Difficult to place patient No            Date of Service 02/27/2021    [facility]   If 7PM-7AM, please contact night-coverage [email or web address] 02/27/2021, 1:25 PM

## 2021-02-27 NOTE — ED Notes (Signed)
Pt states that she is feeling very anxious after albuterol treatments and requesting medication.

## 2021-02-27 NOTE — ED Triage Notes (Signed)
Pt to ED via POV with c/o SOB and cough that started this morning, pt states dx with lung cancer with mets to the brain. Pt states woke up this morning with non-productive cough. Pt ambulatory to treatment room 5, pt with noted soft voice on assessment.

## 2021-02-28 ENCOUNTER — Encounter: Payer: Self-pay | Admitting: Certified Nurse Midwife

## 2021-02-28 ENCOUNTER — Inpatient Hospital Stay: Payer: Medicaid Other | Admitting: Oncology

## 2021-02-28 ENCOUNTER — Ambulatory Visit: Payer: Medicaid Other | Admitting: Radiation Oncology

## 2021-02-28 LAB — CBC
HCT: 28.7 % — ABNORMAL LOW (ref 36.0–46.0)
HCT: 27.3 % — ABNORMAL LOW (ref 36.0–46.0)
HCT: 27 % — ABNORMAL LOW (ref 36.0–46.0)
Hemoglobin: 9.2 g/dL — ABNORMAL LOW (ref 12.0–15.0)
Hemoglobin: 9 g/dL — ABNORMAL LOW (ref 12.0–15.0)
Hemoglobin: 8.9 g/dL — ABNORMAL LOW (ref 12.0–15.0)
MCH: 28.2 pg (ref 26.0–34.0)
MCH: 29 pg (ref 26.0–34.0)
MCH: 29 pg (ref 26.0–34.0)
MCHC: 32.1 g/dL (ref 30.0–36.0)
MCHC: 33 g/dL (ref 30.0–36.0)
MCHC: 33 g/dL (ref 30.0–36.0)
MCV: 88 fL (ref 80.0–100.0)
MCV: 88.1 fL (ref 80.0–100.0)
MCV: 87.9 fL (ref 80.0–100.0)
Platelets: 200 10*3/uL (ref 150–400)
Platelets: 181 10*3/uL (ref 150–400)
Platelets: 182 10*3/uL (ref 150–400)
RBC: 3.26 MIL/uL — ABNORMAL LOW (ref 3.87–5.11)
RBC: 3.1 MIL/uL — ABNORMAL LOW (ref 3.87–5.11)
RBC: 3.07 MIL/uL — ABNORMAL LOW (ref 3.87–5.11)
RDW: 15.6 % — ABNORMAL HIGH (ref 11.5–15.5)
RDW: 15.6 % — ABNORMAL HIGH (ref 11.5–15.5)
RDW: 15.5 % (ref 11.5–15.5)
WBC: 16.2 10*3/uL — ABNORMAL HIGH (ref 4.0–10.5)
WBC: 14.2 10*3/uL — ABNORMAL HIGH (ref 4.0–10.5)
WBC: 12.6 10*3/uL — ABNORMAL HIGH (ref 4.0–10.5)
nRBC: 0 % (ref 0.0–0.2)
nRBC: 0 % (ref 0.0–0.2)
nRBC: 0 % (ref 0.0–0.2)

## 2021-02-28 LAB — LIPID PANEL
Cholesterol: 142 mg/dL (ref 0–200)
HDL: 70 mg/dL (ref >40)
LDL Cholesterol: 55 mg/dL (ref 0–99)
Total CHOL/HDL Ratio: 2 RATIO
Triglycerides: 85 mg/dL (ref <150)
VLDL: 17 mg/dL (ref 0–40)

## 2021-02-28 LAB — BLOOD GAS, ARTERIAL
Acid-Base Excess: 2 mmol/L (ref 0.0–2.0)
Bicarbonate: 26.5 mmol/L (ref 20.0–28.0)
FIO2: 0.21
O2 Saturation: 90.9 %
Patient temperature: 37
pCO2 arterial: 40 mmHg (ref 32.0–48.0)
pH, Arterial: 7.43 (ref 7.350–7.450)
pO2, Arterial: 59 mmHg — ABNORMAL LOW (ref 83.0–108.0)

## 2021-02-28 LAB — CYTOLOGY - PAP
Adequacy: ABSENT
Comment: NEGATIVE
Comment: NEGATIVE
Diagnosis: UNDETERMINED — AB
HPV 16: NEGATIVE
HPV 18 / 45: NEGATIVE
High risk HPV: POSITIVE — AB

## 2021-02-28 LAB — TYPE AND SCREEN
ABO/RH(D): O POS
Antibody Screen: NEGATIVE

## 2021-02-28 LAB — HEMOGLOBIN A1C
Hgb A1c MFr Bld: 5.2 % (ref 4.8–5.6)
Mean Plasma Glucose: 102.54 mg/dL

## 2021-02-28 LAB — BASIC METABOLIC PANEL
Anion gap: 8 (ref 5–15)
BUN: 19 mg/dL (ref 6–20)
CO2: 28 mmol/L (ref 22–32)
Calcium: 8.4 mg/dL — ABNORMAL LOW (ref 8.9–10.3)
Chloride: 104 mmol/L (ref 98–111)
Creatinine, Ser: 0.59 mg/dL (ref 0.44–1.00)
GFR, Estimated: >60 mL/min (ref >60)
Glucose, Bld: 108 mg/dL — ABNORMAL HIGH (ref 70–99)
Potassium: 3.7 mmol/L (ref 3.5–5.1)
Sodium: 140 mmol/L (ref 135–145)

## 2021-02-28 LAB — GLUCOSE, CAPILLARY: Glucose-Capillary: 100 mg/dL — ABNORMAL HIGH (ref 70–99)

## 2021-02-28 MED ORDER — LORAZEPAM 1 MG PO TABS
1.0000 mg | ORAL_TABLET | Freq: Four times a day (QID) | ORAL | Status: DC | PRN
  Administered 2021-02-28 – 2021-03-02 (×3): 1 mg via ORAL
  Filled 2021-02-28 (×3): qty 1

## 2021-02-28 MED ORDER — NICOTINE 21 MG/24HR TD PT24
21.0000 mg | MEDICATED_PATCH | Freq: Every day | TRANSDERMAL | Status: DC
  Administered 2021-02-28: 21 mg via TRANSDERMAL
  Filled 2021-02-28 (×2): qty 1

## 2021-02-28 MED ORDER — LORAZEPAM 0.5 MG PO TABS: 0.5000 mg | ORAL_TABLET | Freq: Four times a day (QID) | ORAL | Status: DC | PRN

## 2021-02-28 MED ORDER — BOOST / RESOURCE BREEZE PO LIQD CUSTOM
1.0000 | Freq: Three times a day (TID) | ORAL | Status: DC
  Administered 2021-02-28 – 2021-03-02 (×3): 1 via ORAL

## 2021-02-28 MED ORDER — HALOPERIDOL LACTATE 5 MG/ML IJ SOLN
4.0000 mg | Freq: Once | INTRAMUSCULAR | Status: AC
  Administered 2021-02-28: 20:00:00 4 mg via INTRAVENOUS
  Filled 2021-02-28: qty 1

## 2021-02-28 MED ORDER — HALOPERIDOL LACTATE 5 MG/ML IJ SOLN
1.0000 mg | Freq: Once | INTRAMUSCULAR | Status: AC
  Administered 2021-02-28: 19:00:00 1 mg via INTRAVENOUS

## 2021-02-28 MED ORDER — METOPROLOL TARTRATE 25 MG PO TABS
12.5000 mg | ORAL_TABLET | Freq: Two times a day (BID) | ORAL | Status: DC
  Administered 2021-02-28 (×2): 12.5 mg via ORAL
  Filled 2021-02-28 (×2): qty 1

## 2021-02-28 MED ORDER — GUAIFENESIN ER 600 MG PO TB12
1200.0000 mg | ORAL_TABLET | Freq: Two times a day (BID) | ORAL | Status: DC
  Administered 2021-02-28 – 2021-03-02 (×5): 1200 mg via ORAL
  Filled 2021-02-28 (×5): qty 2

## 2021-02-28 MED ORDER — LORAZEPAM 0.5 MG PO TABS
0.5000 mg | ORAL_TABLET | Freq: Four times a day (QID) | ORAL | Status: DC | PRN
  Administered 2021-02-28: 0.5 mg via ORAL
  Filled 2021-02-28: qty 1

## 2021-02-28 MED ORDER — METHYLPREDNISOLONE SODIUM SUCC 125 MG IJ SOLR
60.0000 mg | Freq: Three times a day (TID) | INTRAMUSCULAR | Status: DC
  Administered 2021-02-28 – 2021-03-02 (×6): 60 mg via INTRAVENOUS
  Filled 2021-02-28 (×6): qty 2

## 2021-02-28 MED ORDER — LEVETIRACETAM 500 MG PO TABS
500.0000 mg | ORAL_TABLET | Freq: Two times a day (BID) | ORAL | Status: DC
  Administered 2021-02-28 – 2021-03-02 (×4): 500 mg via ORAL
  Filled 2021-02-28 (×7): qty 1

## 2021-02-28 MED ORDER — PANTOPRAZOLE SODIUM 40 MG IV SOLR
40.0000 mg | Freq: Two times a day (BID) | INTRAVENOUS | Status: DC
  Administered 2021-02-28 – 2021-03-02 (×5): 40 mg via INTRAVENOUS
  Filled 2021-02-28 (×5): qty 40

## 2021-02-28 MED ORDER — DIPHENHYDRAMINE HCL 50 MG/ML IJ SOLN
25.0000 mg | Freq: Once | INTRAMUSCULAR | Status: AC
  Administered 2021-02-28: 25 mg via INTRAVENOUS
  Filled 2021-02-28: qty 1

## 2021-02-28 MED ORDER — BENZONATATE 100 MG PO CAPS
100.0000 mg | ORAL_CAPSULE | Freq: Two times a day (BID) | ORAL | Status: DC | PRN
  Administered 2021-03-01 (×2): 100 mg via ORAL
  Filled 2021-02-28 (×2): qty 1

## 2021-02-28 MED ORDER — ADULT MULTIVITAMIN W/MINERALS CH
1.0000 | ORAL_TABLET | Freq: Every day | ORAL | Status: DC
  Administered 2021-02-28 – 2021-03-02 (×3): 1 via ORAL
  Filled 2021-02-28 (×3): qty 1

## 2021-02-28 MED ORDER — HALOPERIDOL LACTATE 5 MG/ML IJ SOLN
INTRAMUSCULAR | Status: AC
  Filled 2021-02-28: qty 1

## 2021-02-28 MED ORDER — IPRATROPIUM-ALBUTEROL 0.5-2.5 (3) MG/3ML IN SOLN
3.0000 mL | Freq: Three times a day (TID) | RESPIRATORY_TRACT | Status: DC
  Administered 2021-02-28 – 2021-03-02 (×6): 3 mL via RESPIRATORY_TRACT
  Filled 2021-02-28 (×6): qty 3
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
## 2021-02-28 NOTE — TOC Initial Note (Signed)
Transition of Care [facility]) - Initial/Assessment Note    Patient Details  Name: Brenda Middleton MRN: [id number] Date of Birth: 15-Oct-1961  Transition of Care [facility]) CM/SW Contact:    Shelbie Hutching, RN Phone Number: 02/28/2021, 10:48 AM  Clinical Narrative:                 Patient admitted to the hospital with pneumonia.  RNCM met with patient and patient's sister at the bedside.  Patient is from home where she lives with her sister, [name].  Patient is independent and drives.  Patient is current with Dr. Lavena Bullion and uses [facility] for prescriptions.   No needs identified at this time.   Expected Discharge Plan: Home/Self Care Barriers to Discharge: Continued Medical Work up   Patient Goals and CMS Choice Patient states their goals for this hospitalization and ongoing recovery are:: Plan for return home and wondering when she will be able to discharge      Expected Discharge Plan and Services Expected Discharge Plan: Home/Self Care   Discharge Planning Services: CM Consult   Living arrangements for the past 2 months: Apartment                 DME Arranged: N/A DME Agency: NA       HH Arranged: NA          Prior Living Arrangements/Services Living arrangements for the past 2 months: Apartment Lives with:: Siblings Patient language and need for interpreter reviewed:: Yes Do you feel safe going back to the place where you live?: Yes      Need for Family Participation in Patient Care: Yes (Comment) (pneumonia) Care giver support system in place?: Yes (comment) (sister)   Criminal Activity/Legal Involvement Pertinent to Current Situation/Hospitalization: No - Comment as needed  Activities of Daily Living Home Assistive Devices/Equipment: None ADL Screening (condition at time of admission) Patient's cognitive ability adequate to safely complete daily activities?: Yes Is the patient deaf or have difficulty hearing?: No Does the patient have difficulty seeing, even  when wearing glasses/contacts?: No Does the patient have difficulty concentrating, remembering, or making decisions?: No Patient able to express need for assistance with ADLs?: No Does the patient have difficulty dressing or bathing?: No Independently performs ADLs?: Yes (appropriate for developmental age) Does the patient have difficulty walking or climbing stairs?: No Weakness of Legs: None Weakness of Arms/Hands: None  Permission Sought/Granted Permission sought to share information with : Case Manager,Family Supports Permission granted to share information with : Yes, Verbal Permission Granted  Share Information with NAME: [name]     Permission granted to share info w Relationship: sister     Emotional Assessment Appearance:: Appears older than stated age Attitude/Demeanor/Rapport: Engaged Affect (typically observed): Accepting Orientation: : Oriented to Self,Oriented to Place,Oriented to  Time,Oriented to Situation Alcohol / Substance Use: Not Applicable Psych Involvement: No (comment)  Admission diagnosis:  Shortness of breath [R06.02] Cough [R05.9] COPD exacerbation ([place]) [J44.1] HCAP (healthcare-associated pneumonia) [J18.9] Patient Active Problem List   Diagnosis Date Noted  . HCAP (healthcare-associated pneumonia) 02/27/2021  . HTN (hypertension) 02/27/2021  . Depression with anxiety 02/27/2021  . Normocytic anemia 02/27/2021  . Sepsis ([place]) 02/27/2021  . Protein-calorie malnutrition, moderate ([place]) 02/27/2021  . Primary malignant neoplasm of lung metastatic to other site ([place]) 02/27/2021  . Elevated troponin 02/27/2021  . Acute metabolic encephalopathy 38/25/0539  . Brain metastases ([place]) 02/27/2021  . Shortness of breath   . Metastatic malignant neoplasm ([place])   .  Intractable low back pain 02/18/2021  . Anxiety   . Depressive disorder   . Pathologic fracture of lumbar vertebra, initial encounter   . 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 COPD exacerbation  ([place])   . GERD (gastroesophageal reflux disease)   . Palliative care encounter   . Pathologic compression fracture of lumbar vertebra ([place])   . Goals of care, counseling/discussion   . Neoplasm related pain   . Iron deficiency anemia due to chronic blood loss 09/12/2019  . Family history of cancer 09/12/2019  . Blood in the stool 09/12/2019  . Elevated alkaline phosphatase level 09/12/2019  . Chronic low back pain 08/26/2014   PCP:  Remi Haggard, FNP Pharmacy:   [place], [place] [address] Phone: [phone] Fax: [phone]  [id number] [id number]: [phone]: [phone]    Social Determinants of Health (SDOH) Interventions    Readmission Risk Interventions Readmission Risk Prevention Plan 02/28/2021  Transportation Screening Complete  Medication Review ([place] Complete  PCP or Specialist appointment within 3-5 days of discharge Complete  HRI or Home Care Consult Patient refused  SW Recovery Care/Counseling Consult Complete  Palliative Care Screening Not APeoriaNot Applicable  Some recent data might be hidden

## 2021-02-28 NOTE — Progress Notes (Signed)
   02/28/21 0814  Assess: MEWS Score  Temp 98 F (36.7 C)  BP (!) 144/82  Pulse Rate (!) 128  Resp 20  SpO2 99 %  Assess: MEWS Score  MEWS Temp 0  MEWS Systolic 0  MEWS Pulse 2  MEWS RR 0  MEWS LOC 0  MEWS Score 2  MEWS Score Color Yellow  Assess: if the MEWS score is Yellow or Red  Were vital signs taken at a resting state? Yes  Focused Assessment No change from prior assessment  Early Detection of Sepsis Score *See Row Information* Low  MEWS guidelines implemented *See Row Information* Yes  Treat  Pain Scale 0-10  Pain Score 7  Pain Type Chronic pain  Pain Location Generalized  Pain Intervention(s) Medication (See eMAR)  Take Vital Signs  Increase Vital Sign Frequency  Yellow: Q 2hr X 2 then Q 4hr X 2, if remains yellow, continue Q 4hrs  Escalate  MEWS: Escalate Yellow: discuss with charge nurse/RN and consider discussing with provider and RRT  Notify: Charge Nurse/RN  Name of Charge Nurse/RN Notified Caryl Pina  Date Charge Nurse/RN Notified 02/28/21  Time Charge Nurse/RN Notified 0830  Notify: Provider  Provider Name/Title [place]  Date Provider Notified 02/28/21  Time Provider Notified 0830  Notification Type Page  Date of Provider Response 02/28/21  Time of Provider Response [phone]

## 2021-02-28 NOTE — Progress Notes (Signed)
Pt getting out of bed despite our repeated attempts today of telling her why she can't get out of bed and take her oxygen off. Patient confused, not following commands, saying she's going to leave. I was able to get her to sit back down on the bed after walking her around the room. Patient very short of breath, anxious still despite PO Ativan and Oxycodone given an hour ago. Doctor called.

## 2021-02-28 NOTE — Progress Notes (Signed)
Initial Nutrition Assessment  DOCUMENTATION CODES:   Underweight  INTERVENTION:   -D/c Ensure Enlive po BID, each supplement provides 350 kcal and 20 grams of protein -Boost Breeze po TID, each supplement provides 250 kcal and 9 grams of protein -Magic cup TID with meals, each supplement provides 290 kcal and 9 grams of protein -MVI with minerals daily -Liberalize diet to regular for increased variety of meal selections  NUTRITION DIAGNOSIS:   Inadequate oral intake related to decreased appetite as evidenced by meal completion < 50%.  GOAL:   Patient will meet greater than or equal to 90% of their needs  MONITOR:   PO intake,Supplement acceptance,Labs,Weight trends,Skin,I & O's  REASON FOR ASSESSMENT:   Malnutrition Screening Tool    ASSESSMENT:   Brenda Middleton is a 60 y.o. female with medical history significant of recently diagnosed metastasized to lung cancer, hypertension, COPD, GERD, depression with anxiety, anemia, HCV, who presents with shortness breath.  Pt admitted with sepsis secondary to HCAP.   Reviewed I/O's: +1.8 L x 24 hours  UOP: 500 ml x 24 hours  Pt receiving nursing care at time of visit. Unable to obtain further nutrition-related history or complete nutrition-focused physical exam at this time.   Pt with poor oral intake. Noted meal completions 30%. Per chart review, pt is lethargic, however, pt sister reports that mental status has improved today. She is refusing Ensure Enlive supplements.   Reviewed wt hx; pt has experienced a 5.6% wt loss over the past month, which is significant for time frame.   Highly suspect pt with malnutrition, however, unable to identify at this time.   Labs reviewed: CBGS: 100.  Diet Order:   Diet Order            Diet Heart Room service appropriate? Yes; Fluid consistency: Thin  Diet effective now                 EDUCATION NEEDS:   No education needs have been identified at this time  Skin:  Skin  Assessment: Reviewed RN Assessment  Last BM:  02/28/21  Height:   Ht Readings from Last 1 Encounters:  02/27/21 5\' 6"  (1.676 m)    Weight:   Wt Readings from Last 1 Encounters:  02/27/21 51.8 kg    Ideal Body Weight:  59.1 kg  BMI:  Body mass index is 18.43 kg/m.  Estimated Nutritional Needs:   Kcal:  1850-2050  Protein:  105-120 grams  Fluid:  > 1.8 L    Loistine Chance, RD, LDN, [place] Registered Dietitian II Certified Diabetes Care and Education Specialist Please refer to [facility] for RD and/or RD on-call/weekend/after hours pager

## 2021-02-28 NOTE — Progress Notes (Signed)
Pt will not keep telemetry on, refusing to wear it despite our explanations of why it's important. Sitter at bedside. Doctor notified.

## 2021-02-28 NOTE — Progress Notes (Addendum)
PROGRESS NOTE    Brenda Middleton  YKZ:[id number] DOB: 1961-06-16 DOA: 02/27/2021 PCP: Remi Haggard, FNP   Brief Narrative: 60 year old with past medical history significant for recent diagnosis of metastatic  lung cancer, hypertension, COPD, GERD, depression with anxiety, anemia who presents complaining of worsening shortness of breath. 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  Patient has been having worsening cough for about 1 week worse since the morning of admission.  Patient reported chest tightness.  No nausea vomiting. Evaluation in the ED patient was found to have white count of 23, lactic acid 1.6, chest x-ray with new hazy opacity bilaterally.  CT angio negative for PE but showed extensive groundglass opacity.  Assessment & Plan:   Principal Problem:   HCAP (healthcare-associated pneumonia) Active Problems:   COPD exacerbation (HCC)   GERD (gastroesophageal reflux disease)   Pathologic compression fracture of lumbar vertebra (HCC)   HTN (hypertension)   Depression with anxiety   Normocytic anemia   Sepsis ([place])   Protein-calorie malnutrition, moderate ([place])   Primary malignant neoplasm of lung metastatic to other site [facility])   Elevated troponin   Acute metabolic encephalopathy   Brain metastases (HCC)   Shortness of breath  1-Acute Hypoxic Respiratory Failure secondary to Healthcare Associated Pneumonia and Lung cancer -ABG obtained PO2 59.  CT angio: Negative for  PE, extensive groundglass airspace opacity throughout the lung.  Bronchitis, redemonstrated finding of advanced metastatic lung cancer. -Oxygen delivery will be increased to 5 L. -Will schedule guaifenesin -Change Decadron to IV Solu-Medrol to help with wheezing -Continue with a scheduled nebulizer treatments -Continue with treatment for pneumonia with IV antibiotics.  2-Sepsis Secondary to Pneumonia: -Patient  presents with leukocytosis, tachycardia, tachypnea. -CT angio with extensive groundglass airspace opacity -Continue with IV vancomycin and cefepime -Blood cultures -Follow Legionella and strep antigen  3-Acute COPD exacerbation: Change oral steroid to IV -Continue with nebulizers  4-Lung cancer with mets to brain and skull: -Appreciate Dr. Tasia Catchings evaluation.  -On Decadron 4 mg twice daily, changed to IV Solu-Medrol to cover for COPD exacerbation -Need to resume oral Decadron -Discussed with Dr Tasia Catchings, will start Jennette for seizure prophylaxis.   5-GERD: Continue with Protonix 6-Pathologic  compression fracture of lumbar vertebra: Continue with fentanyl patch and as needed Percocet 7-Hypertension: Hold blood pressure medication 8-Hematochezia: Patient reported hematochezia for a long period of time: Start IV Protonix.  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 -Cycle hemoglobin.  Discontinue Lovenox that was ordered for DVT prophylaxis.  Discontinue aspirin. -GI consulted 9-Tachycardia; added low dose metoprolol.    Estimated body mass index is 18.43 kg/m as calculated from the following:   Height as of this encounter: 5\' 6"  (1.676 m).   Weight as of this encounter: 51.8 kg.   DVT prophylaxis: SCD Code Status: Full code Family Communication: care discussed with sister  Disposition Plan:  Status is: Inpatient  Remains inpatient appropriate because:IV treatments appropriate due to intensity of illness or inability to take PO   Dispo: The patient is from: Home              Anticipated d/c is to: Home              Patient currently is not medically stable to d/c.   Difficult to place patient No        Consultants:   Oncology  GI  Procedures:  None  Antimicrobials:  Cefepime Vancomycin  Subjective: She was lethargic on my evaluation. She had her oxygen off.  Per sister she is more alert, than yesterday.  She is breathing a little  better. She has been having blood  Per rectum, on and off.   Objective: Vitals:   02/27/21 1711 02/27/21 2121 02/27/21 2123 02/28/21 0639  BP: (!) 164/91  (!) 166/88 (!) 147/82  Pulse: 89  (!) 109 100  Resp: 18  18 18   Temp: 98.4 F (36.9 C)  97.8 F (36.6 C) 98 F (36.7 C)  TempSrc:   Oral Oral  SpO2: 90% 94% 95% 95%  Weight:      Height:        Intake/Output Summary (Last 24 hours) at 02/28/2021 3419 Last data filed at 02/28/2021 0700 Gross per 24 hour  Intake 2310 ml  Output 500 ml  Net 1810 ml   Filed Weights   02/27/21 0803  Weight: 51.8 kg    Examination:  General exam: chronic ill appearing, Lethargic Respiratory system: Tachypnea, BL wheezing Cardiovascular system: S1 & S2 heard, RRR. Gastrointestinal system: Abdomen is nondistended, soft and nontender. No organomegaly or masses felt. Normal bowel sounds heard. Central nervous system: Lethargic. Extremities: Symmetric 5 x 5 power. S   Data Reviewed: I have personally reviewed following labs and imaging studies  CBC: Recent Labs  Lab 02/27/21 0819 02/28/21 0322  WBC 23.6* 16.2*  NEUTROABS 20.5*  --   HGB 10.7* 9.2*  HCT 32.4* 28.7*  MCV 86.6 88.0  PLT 241 379   Basic Metabolic Panel: Recent Labs  Lab 02/27/21 0819 02/28/21 0322  NA 137 140  K 3.8 3.7  CL 102 104  CO2 25 28  GLUCOSE 164* 108*  BUN 21* 19  CREATININE 0.68 0.59  CALCIUM 8.3* 8.4*  MG 2.0  --    GFR: Estimated Creatinine Clearance: 61.2 mL/min (by C-G formula based on SCr of 0.59 mg/dL). Liver Function Tests: Recent Labs  Lab 02/27/21 0819  AST 29  ALT 32  ALKPHOS 312*  BILITOT 0.5  PROT 6.8  ALBUMIN 3.3*   No results for input(s): LIPASE, AMYLASE in the last 168 hours. Recent Labs  Lab 02/27/21 1243  AMMONIA 17   Coagulation Profile: No results for input(s): INR, PROTIME in the last 168 hours. Cardiac Enzymes: No results for input(s): CKTOTAL, CKMB, CKMBINDEX, TROPONINI in the last 168 hours. BNP (last 3  results) No results for input(s): PROBNP in the last 8760 hours. HbA1C: No results for input(s): HGBA1C in the last 72 hours. CBG: No results for input(s): GLUCAP in the last 168 hours. Lipid Profile: Recent Labs    02/28/21 0322  CHOL 142  HDL 70  LDLCALC 55  TRIG 85  CHOLHDL 2.0   Thyroid Function Tests: No results for input(s): TSH, T4TOTAL, FREET4, T3FREE, THYROIDAB in the last 72 hours. Anemia Panel: No results for input(s): VITAMINB12, FOLATE, FERRITIN, TIBC, IRON, RETICCTPCT in the last 72 hours. Sepsis Labs: Recent Labs  Lab 02/27/21 0907 02/27/21 1243  PROCALCITON  --  0.10  LATICACIDVEN 1.6  --     Recent Results (from the past 240 hour(s))  Resp Panel by RT-PCR (Flu A&B, Covid) Nasopharyngeal Swab     Status: None   Collection Time: 02/18/21  9:24 AM   Specimen: Nasopharyngeal Swab; Nasopharyngeal(NP) swabs in vial transport medium  Result Value Ref Range Status   SARS Coronavirus 2 by RT PCR NEGATIVE NEGATIVE Final    Comment: (NOTE)  SARS-CoV-2 target nucleic acids are NOT DETECTED.  The SARS-CoV-2 RNA is generally detectable in upper respiratory specimens during the acute phase of infection. The lowest concentration of SARS-CoV-2 viral copies this assay can detect is 138 copies/mL. A negative result does not preclude SARS-Cov-2 infection and should not be used as the sole basis for treatment or other patient management decisions. A negative result may occur with  improper specimen collection/handling, submission of specimen other than nasopharyngeal swab, presence of viral mutation(s) within the areas targeted by this assay, and inadequate number of viral copies(<138 copies/mL). A negative result must be combined with clinical observations, patient history, and epidemiological information. The expected result is Negative.  Fact Sheet for Patients:  EntrepreneurPulse.com.au  Fact Sheet for Healthcare Providers:   IncredibleEmployment.be  This test is no t yet approved or cleared by the [place] FDA and  has been authorized for detection and/or diagnosis of SARS-CoV-2 by FDA under an Emergency Use Authorization (EUA). This EUA will remain  in effect (meaning this test can be used) for the duration of the COVID-19 declaration under Section 564(b)(1) of the Act, 21 U.S.C.section 360bbb-3(b)(1), unless the authorization is terminated  or revoked sooner.       Influenza A by PCR NEGATIVE NEGATIVE Final   Influenza B by PCR NEGATIVE NEGATIVE Final    Comment: (NOTE) The Xpert Xpress SARS-CoV-2/FLU/RSV plus assay is intended as an aid in the diagnosis of influenza from Nasopharyngeal swab specimens and should not be used as a sole basis for treatment. Nasal washings and aspirates are unacceptable for Xpert Xpress SARS-CoV-2/FLU/RSV testing.  Fact Sheet for Patients: EntrepreneurPulse.com.au  Fact Sheet for Healthcare Providers: IncredibleEmployment.be  This test is not yet approved or cleared by the [place] FDA and has been authorized for detection and/or diagnosis of SARS-CoV-2 by FDA under an Emergency Use Authorization (EUA). This EUA will remain in effect (meaning this test can be used) for the duration of the COVID-19 declaration under Section 564(b)(1) of the Act, 21 U.S.C. section 360bbb-3(b)(1), unless the authorization is terminated or revoked.  Performed at [facility], [address] [place], [place] [address]   MRSA PCR Screening     Status: None   Collection Time: 02/19/21  4:22 PM   Specimen: Nasopharyngeal  Result Value Ref Range Status   MRSA by PCR NEGATIVE NEGATIVE Final    Comment:        The GeneXpert MRSA Assay (FDA approved for NASAL specimens only), is one component of a comprehensive MRSA colonization surveillance program. It is not intended to diagnose MRSA infection nor to guide  or monitor treatment for MRSA infections. Performed at [facility], [address] [place], [place] [address]   Culture, blood (single)     Status: None (Preliminary result)   Collection Time: 02/27/21  9:07 AM   Specimen: BLOOD  Result Value Ref Range Status   Specimen Description BLOOD RIGHT FOREARM  Final   Special Requests   Final    BOTTLES DRAWN AEROBIC AND ANAEROBIC Blood Culture adequate volume   Culture   Final    NO GROWTH < 24 HOURS Performed at [facility], [address], [place], [place] [address]    Report Status PENDING  Incomplete  Resp Panel by RT-PCR (Flu A&B, Covid) Nasopharyngeal Swab     Status: None   Collection Time: 02/27/21  9:07 AM   Specimen: Nasopharyngeal Swab; Nasopharyngeal(NP) swabs in vial transport medium  Result Value Ref Range Status  SARS Coronavirus 2 by RT PCR NEGATIVE NEGATIVE Final    Comment: (NOTE) SARS-CoV-2 target nucleic acids are NOT DETECTED.  The SARS-CoV-2 RNA is generally detectable in upper respiratory specimens during the acute phase of infection. The lowest concentration of SARS-CoV-2 viral copies this assay can detect is 138 copies/mL. A negative result does not preclude SARS-Cov-2 infection and should not be used as the sole basis for treatment or other patient management decisions. A negative result may occur with  improper specimen collection/handling, submission of specimen other than nasopharyngeal swab, presence of viral mutation(s) within the areas targeted by this assay, and inadequate number of viral copies(<138 copies/mL). A negative result must be combined with clinical observations, patient history, and epidemiological information. The expected result is Negative.  Fact Sheet for Patients:  EntrepreneurPulse.com.au  Fact Sheet for Healthcare Providers:  IncredibleEmployment.be  This test is no t yet approved or cleared by the [place] FDA and   has been authorized for detection and/or diagnosis of SARS-CoV-2 by FDA under an Emergency Use Authorization (EUA). This EUA will remain  in effect (meaning this test can be used) for the duration of the COVID-19 declaration under Section 564(b)(1) of the Act, 21 U.S.C.section 360bbb-3(b)(1), unless the authorization is terminated  or revoked sooner.       Influenza A by PCR NEGATIVE NEGATIVE Final   Influenza B by PCR NEGATIVE NEGATIVE Final    Comment: (NOTE) The Xpert Xpress SARS-CoV-2/FLU/RSV plus assay is intended as an aid in the diagnosis of influenza from Nasopharyngeal swab specimens and should not be used as a sole basis for treatment. Nasal washings and aspirates are unacceptable for Xpert Xpress SARS-CoV-2/FLU/RSV testing.  Fact Sheet for Patients: EntrepreneurPulse.com.au  Fact Sheet for Healthcare Providers: IncredibleEmployment.be  This test is not yet approved or cleared by the [place] FDA and has been authorized for detection and/or diagnosis of SARS-CoV-2 by FDA under an Emergency Use Authorization (EUA). This EUA will remain in effect (meaning this test can be used) for the duration of the COVID-19 declaration under Section 564(b)(1) of the Act, 21 U.S.C. section 360bbb-3(b)(1), unless the authorization is terminated or revoked.  Performed at [facility], [address] [place], [place] [address]          Radiology Studies: CT Angio Chest PE W and/or Wo Contrast  Result Date: 02/27/2021 CLINICAL DATA:  PE suspected, widely metastatic lung cancer EXAM: CT ANGIOGRAPHY CHEST WITH CONTRAST TECHNIQUE: Multidetector CT imaging of the chest was performed using the standard protocol during bolus administration of intravenous contrast. Multiplanar CT image reconstructions and MIPs were obtained to evaluate the vascular anatomy. CONTRAST:  5mL OMNIPAQUE IOHEXOL 350 MG/ML SOLN COMPARISON:  02/15/2021 FINDINGS:  Cardiovascular: Satisfactory opacification of the pulmonary arteries to the segmental level. No evidence of pulmonary embolism. Cardiomegaly. Left and right coronary artery calcifications and stents. No pericardial effusion. Mediastinum/Nodes: Redemonstrated, very extensive bulky bilateral hilar, mediastinal, supraclavicular, and left axillary lymphadenopathy. Thyroid gland, trachea, and esophagus demonstrate no significant findings. Lungs/Pleura: Diffuse bilateral bronchial wall thickening. Spiculated right upper lobe mass as seen on prior examination (series 7, image 42). Numerous redemonstrated small pulmonary nodules throughout the lungs. There is new, extensive ground-glass airspace opacity throughout the lungs, somewhat geographic in the upper lobes (series 7, image 33), although somewhat more nodular appearing in the lower lungs (series 7, image 58). No pleural effusion or pneumothorax. Upper Abdomen: No acute abnormality. Multiple low-attenuation lesions of liver, better assessed by prior dedicated of the abdomen. Musculoskeletal: No  chest wall abnormality. Multiple osseous metastatic lesions, most significantly a lytic lesion of the T9 vertebral body (series 9, image 58). Review of the MIP images confirms the above findings. IMPRESSION: 1. Negative examination for pulmonary embolism. 2. There is new, extensive ground-glass airspace opacity throughout the lungs, somewhat geographic in the upper lobes, although somewhat more nodular appearing in the lower lungs. Findings are nonspecific and infectious or inflammatory, differential considerations primarily include drug toxicity and atypical/viral infection. 3. Diffuse bilateral bronchial wall thickening, consistent with nonspecific infectious or inflammatory bronchitis. 4. Redemonstrated findings of advanced metastatic lung malignancy, better assessed by recent prior staging examination. 5. Coronary artery disease. Electronically Signed   By: Eddie Candle M.D.    On: 02/27/2021 10:49   DG Chest Port 1 View  Result Date: 02/27/2021 CLINICAL DATA:  Worsening shortness of breath since this morning. Recent diagnosis of lung cancer. EXAM: PORTABLE CHEST 1 VIEW COMPARISON:  Radiograph earlier today.  CT earlier today. FINDINGS: Stable heart size and mediastinal contours. Right hilar prominence and thickening of the lower paratracheal stripe related to known adenopathy. Unchanged right mid lung pulmonary nodule. Mild patchy bilateral suprahilar opacities corresponding to ground-glass opacity on CT earlier today. No significant change in the interim. No pneumothorax or large pleural effusion. Retained excreted IV contrast in the right renal collecting system with right hydronephrosis, partially included in the upper abdomen. IMPRESSION: 1. Stable radiographic appearance of the chest from earlier today. Similar patchy suprahilar opacities corresponding to ground-glass opacity on CT. 2. Right mid lung pulmonary nodule and thoracic adenopathy. 3. Partially included in the upper abdomen is retained excreted contrast in the right renal collecting system with right hydronephrosis. Right hydronephrosis was seen on renal ultrasound 02/18/2021. Electronically Signed   By: Keith Rake M.D.   On: 02/27/2021 15:04   DG Chest Portable 1 View  Result Date: 02/27/2021 CLINICAL DATA:  60 year old female with shortness of breath and cough. Metastatic lung cancer. EXAM: PORTABLE CHEST 1 VIEW COMPARISON:  02/15/2021 and prior studies FINDINGS: New hazy opacities within the central/upper lungs noted. RIGHT middle lobe mass and small scattered pulmonary nodules bilaterally are again identified. No pleural effusion or pneumothorax. No acute bony abnormalities are identified. IMPRESSION: New hazy opacities within the central/upper lungs which may represent edema or infection. Unchanged RIGHT middle lobe mass and bilateral pulmonary nodules compatible with known malignancy/metastases.  Electronically Signed   By: Margarette Canada M.D.   On: 02/27/2021 08:49        Scheduled Meds: . aspirin EC  81 mg Oral Daily  . busPIRone  10 mg Oral BID  . dexamethasone  4 mg Oral BID  . enoxaparin (LOVENOX) injection  40 mg Subcutaneous Q24H  . feeding supplement  237 mL Oral TID BM  . fentaNYL  1 patch Transdermal Q72H  . gabapentin  300 mg Oral Daily  . ipratropium-albuterol  3 mL Nebulization Q4H  . mometasone-formoterol  2 puff Inhalation BID  .  morphine injection  6 mg Intravenous Once  . pantoprazole  40 mg Oral Daily  . QUEtiapine  400 mg Oral QHS   Continuous Infusions: . ceFEPime (MAXIPIME) IV 2 g (02/28/21 0551)  . vancomycin 1,000 mg (02/28/21 0014)     LOS: 1 day    Time spent: 35 minutes    Brnadon Eoff A Shanna Strength, MD [facility]   If 7PM-7AM, please contact night-coverage [email or web address]  02/28/2021, 7:27 AM

## 2021-03-01 DIAGNOSIS — C7931 Secondary malignant neoplasm of brain: Secondary | ICD-10-CM | POA: Diagnosis not present

## 2021-03-01 DIAGNOSIS — J441 Chronic obstructive pulmonary disease with (acute) exacerbation: Secondary | ICD-10-CM | POA: Diagnosis not present

## 2021-03-01 DIAGNOSIS — Z515 Encounter for palliative care: Secondary | ICD-10-CM | POA: Diagnosis not present

## 2021-03-01 DIAGNOSIS — C3490 Malignant neoplasm of unspecified part of unspecified bronchus or lung: Secondary | ICD-10-CM | POA: Diagnosis not present

## 2021-03-01 DIAGNOSIS — K625 Hemorrhage of anus and rectum: Secondary | ICD-10-CM

## 2021-03-01 DIAGNOSIS — J189 Pneumonia, unspecified organism: Secondary | ICD-10-CM | POA: Diagnosis not present

## 2021-03-01 LAB — CBC
HCT: 25.9 % — ABNORMAL LOW (ref 36.0–46.0)
HCT: 28.9 % — ABNORMAL LOW (ref 36.0–46.0)
HCT: 29.9 % — ABNORMAL LOW (ref 36.0–46.0)
Hemoglobin: 8.4 g/dL — ABNORMAL LOW (ref 12.0–15.0)
Hemoglobin: 9.6 g/dL — ABNORMAL LOW (ref 12.0–15.0)
Hemoglobin: 10 g/dL — ABNORMAL LOW (ref 12.0–15.0)
MCH: 28.6 pg (ref 26.0–34.0)
MCH: 29.1 pg (ref 26.0–34.0)
MCH: 29.2 pg (ref 26.0–34.0)
MCHC: 32.4 g/dL (ref 30.0–36.0)
MCHC: 33.2 g/dL (ref 30.0–36.0)
MCHC: 33.4 g/dL (ref 30.0–36.0)
MCV: 88.1 fL (ref 80.0–100.0)
MCV: 87.6 fL (ref 80.0–100.0)
MCV: 87.2 fL (ref 80.0–100.0)
Platelets: 200 10*3/uL (ref 150–400)
Platelets: 235 10*3/uL (ref 150–400)
Platelets: 292 10*3/uL (ref 150–400)
RBC: 2.94 MIL/uL — ABNORMAL LOW (ref 3.87–5.11)
RBC: 3.3 MIL/uL — ABNORMAL LOW (ref 3.87–5.11)
RBC: 3.43 MIL/uL — ABNORMAL LOW (ref 3.87–5.11)
RDW: 15.6 % — ABNORMAL HIGH (ref 11.5–15.5)
RDW: 15.9 % — ABNORMAL HIGH (ref 11.5–15.5)
RDW: 15.7 % — ABNORMAL HIGH (ref 11.5–15.5)
WBC: 14.3 10*3/uL — ABNORMAL HIGH (ref 4.0–10.5)
WBC: 19.5 10*3/uL — ABNORMAL HIGH (ref 4.0–10.5)
WBC: 25.1 10*3/uL — ABNORMAL HIGH (ref 4.0–10.5)
nRBC: 0 % (ref 0.0–0.2)
nRBC: 0 % (ref 0.0–0.2)
nRBC: 0 % (ref 0.0–0.2)

## 2021-03-01 LAB — BASIC METABOLIC PANEL
Anion gap: 6 (ref 5–15)
BUN: 23 mg/dL — ABNORMAL HIGH (ref 6–20)
CO2: 24 mmol/L (ref 22–32)
Calcium: 8.5 mg/dL — ABNORMAL LOW (ref 8.9–10.3)
Chloride: 109 mmol/L (ref 98–111)
Creatinine, Ser: 0.7 mg/dL (ref 0.44–1.00)
GFR, Estimated: >60 mL/min (ref >60)
Glucose, Bld: 129 mg/dL — ABNORMAL HIGH (ref 70–99)
Potassium: 4.1 mmol/L (ref 3.5–5.1)
Sodium: 139 mmol/L (ref 135–145)

## 2021-03-01 LAB — RETIC PANEL
Immature Retic Fract: 17.1 % — ABNORMAL HIGH (ref 2.3–15.9)
RBC.: 3.24 MIL/uL — ABNORMAL LOW (ref 3.87–5.11)
Retic Count, Absolute: 91.7 10*3/uL (ref 19.0–186.0)
Retic Ct Pct: 2.8 % (ref 0.4–3.1)
Reticulocyte Hemoglobin: 28.2 pg (ref >27.9)

## 2021-03-01 MED ORDER — HYDROCOD POLST-CPM POLST ER 10-8 MG/5ML PO SUER
5.0000 mL | Freq: Two times a day (BID) | ORAL | Status: DC | PRN
  Administered 2021-03-01: 5 mL via ORAL
  Filled 2021-03-01: qty 5

## 2021-03-01 MED ORDER — POLYETHYLENE GLYCOL 3350 17 G PO PACK
17.0000 g | PACK | Freq: Every day | ORAL | Status: DC
  Filled 2021-03-01: qty 1

## 2021-03-01 MED ORDER — HYDROMORPHONE HCL 1 MG/ML IJ SOLN
1.0000 mg | Freq: Once | INTRAMUSCULAR | Status: AC
  Administered 2021-03-01: 1 mg via INTRAVENOUS
  Filled 2021-03-01: qty 1

## 2021-03-01 NOTE — Progress Notes (Addendum)
Sitter has come out of room multiple times to let me know that patient is upset and complaining that her head hurts and the oxygen makes it worse because it's blowing air back to her brain. I instructed her to keep oxygen on due to her dyspnea with exertion. Went in and let her know she has had all pain medication that is ordered and she requested cough medicine. Notified patient that I would ask doctor for her requests. She stated that tessalon pearls were not helping and that she needed something else. Told her I would ask the doctor. Sent Dr. Tyrell Antonio secure chat, no new orders at the moment. Sitter at bedside, bed in low position and call bell in reach.  Dr. Tyrell Antonio added new orders accordingly.

## 2021-03-01 NOTE — Consult Note (Addendum)
[place]  [phone] [phone]   Name: Brenda Middleton Date: 03/01/2021 MRN: [id number]  DOB: [birth year]  Patient Care Team: Remi Haggard, FNP as PCP - General (Family Medicine) Remi Haggard, FNP (Family Medicine) Christene Lye, MD (General Surgery) Telford Nab, RN as Oncology Nurse Navigator    REASON FOR CONSULTATION: Brenda Middleton is a 60 y.o. female with multiple medical problems including anxiety, nicotine dependence, hypertension, depression, and chronic low back pain.  Patient was recently diagnosed with stage IV non-small cell lung cancer.  MRI of the brain revealed destructive mass in the occipital bone bilaterally extending into the posterior fossa with mass-effect to the left cerebellum.  CT of the chest abdomen and pelvis revealed a large mass in the right middle lobe with widespread metastases involving lymphadenopathy in the chest, multiple pulmonary nodules, bone lesions, and liver lesions.  She was found to have a pathologic fracture of L2.  Patient was recently hospitalized 02/18/2021 -02/21/2021 with intractable low back pain.  Pain improved with dose escalation of opioids.  Patient is now readmitted 02/27/2021 with [facility]. palliative care was consulted help address goals and manage ongoing symptoms.  SOCIAL HISTORY:     reports that she has quit smoking. Her smoking use included cigarettes. She has a 24.50 pack-year smoking history. She has never used smokeless tobacco. She reports current alcohol use. She reports that she does not use drugs.  Patient is unmarried.  She has no children.  She lives at home with her sister.  She has another sister in [place].  Patient is disabled.  She has a history of substance use, primarily crack cocaine but reports sobriety over the past 6 years.  ADVANCE DIRECTIVES:  None on file  CODE STATUS: Full code  PAST MEDICAL HISTORY: Past Medical  History:  Diagnosis Date  . Anxiety   . Benign neoplasm of cervix uteri   . Chronic hepatitis C without mention of hepatic coma   . Chronic low back pain 08/26/2014  . Constipation   . Depressive disorder   . Displacement of cervical intervertebral disc   . Hypertensive disorder   . Iron deficiency anemia due to chronic blood loss 09/12/2019  . Palpitations   . Prolapsed cervical intervertebral disc     PAST SURGICAL HISTORY:  Past Surgical History:  Procedure Laterality Date  . CONIZATION CERVIX    . DILATION AND CURETTAGE OF UTERUS    . HAND SURGERY  2008,2015   Carpel Tunnel  . TONSILLECTOMY    . VULVECTOMY      HEMATOLOGY/ONCOLOGY HISTORY:  Oncology History   No history exists.    ALLERGIES:  is allergic to penicillin g, penicillin v, and penicillins.  MEDICATIONS:  Current Facility-Administered Medications  Medication Dose Route Frequency Provider Last Rate Last Admin  . acetaminophen (TYLENOL) tablet 650 mg  650 mg Oral Q6H PRN Ivor Costa, MD      . albuterol (PROVENTIL) (2.5 MG/3ML) 0.083% nebulizer solution 2.5 mg  2.5 mg Nebulization Q4H PRN Ivor Costa, MD      . benzonatate (TESSALON) capsule 100 mg  100 mg Oral BID PRN Regalado, Belkys A, MD      . busPIRone (BUSPAR) tablet 10 mg  10 mg Oral BID Ivor Costa, MD   10 mg at 03/01/21 1009  . ceFEPIme (MAXIPIME) 2 g in sodium chloride 0.9 % 100 mL IVPB  2 g Intravenous Q8H Dallie Piles, RPH 200 mL/hr  at 03/01/21 0539 2 g at 03/01/21 0539  . feeding supplement (BOOST / RESOURCE BREEZE) liquid 1 Container  1 Container Oral TID BM Regalado, Belkys A, MD   1 Container at 03/01/21 1011  . fentaNYL (DURAGESIC) 50 MCG/HR 1 patch  1 patch Transdermal Q72H Ivor Costa, MD   1 patch at 02/28/21 0827  . gabapentin (NEURONTIN) capsule 300 mg  300 mg Oral Daily Ivor Costa, MD   300 mg at 03/01/21 1009  . guaiFENesin (MUCINEX) 12 hr tablet 1,200 mg  1,200 mg Oral BID Regalado, Belkys A, MD   1,200 mg at 03/01/21 1009  .  hydrALAZINE (APRESOLINE) injection 5 mg  5 mg Intravenous Q2H PRN Ivor Costa, MD      . hydrOXYzine (ATARAX/VISTARIL) tablet 25 mg  25 mg Oral TID PRN Ivor Costa, MD   25 mg at 02/28/21 1258  . ipratropium-albuterol (DUONEB) 0.5-2.5 (3) MG/3ML nebulizer solution 3 mL  3 mL Nebulization TID Ivor Costa, MD   3 mL at 03/01/21 6314  . levETIRAcetam (KEPPRA) tablet 500 mg  500 mg Oral BID Regalado, Belkys A, MD   500 mg at 02/28/21 2300  . LORazepam (ATIVAN) tablet 1 mg  1 mg Oral Q6H PRN Regalado, Belkys A, MD   1 mg at 02/28/21 1805  . methylPREDNISolone sodium succinate (SOLU-MEDROL) 125 mg/2 mL injection 60 mg  60 mg Intravenous Q8H Regalado, Belkys A, MD   60 mg at 03/01/21 0538  . mometasone-formoterol (DULERA) 100-5 MCG/ACT inhaler 2 puff  2 puff Inhalation BID Ivor Costa, MD   2 puff at 03/01/21 0740  . multivitamin with minerals tablet 1 tablet  1 tablet Oral Daily Regalado, Belkys A, MD   1 tablet at 03/01/21 1009  . nicotine (NICODERM CQ - dosed in mg/24 hours) patch 21 mg  21 mg Transdermal Daily Regalado, Belkys A, MD   21 mg at 02/28/21 1900  . ondansetron (ZOFRAN) injection 4 mg  4 mg Intravenous Q8H PRN Ivor Costa, MD      . oxyCODONE-acetaminophen (PERCOCET/ROXICET) 5-325 MG per tablet 1 tablet  1 tablet Oral Q6H PRN Ivor Costa, MD   1 tablet at 03/01/21 1008  . pantoprazole (PROTONIX) injection 40 mg  40 mg Intravenous Q12H Regalado, Belkys A, MD   40 mg at 03/01/21 1009  . QUEtiapine (SEROQUEL) tablet 400 mg  400 mg Oral QHS Ivor Costa, MD   400 mg at 02/28/21 2007  . vancomycin (VANCOCIN) IVPB 1000 mg/200 mL premix  1,000 mg Intravenous Q24H Dallie Piles, [facility]   Stopped at 03/01/21 0030    VITAL SIGNS: BP 134/80 (BP Location: Left Arm)   Pulse (!) 102   Temp 99.6 F (37.6 C) (Oral)   Resp 20   Ht _0  (1.676 m)   Wt 114 lb 3.2 oz (51.8 kg)   SpO2 94%   BMI 18.43 kg/m  Filed Weights   02/27/21 0803  Weight: 114 lb 3.2 oz (51.8 kg)    Estimated body mass index is 18.43  kg/m as calculated from the following:   Height as of this encounter: _1  (1.676 m).   Weight as of this encounter: 114 lb 3.2 oz (51.8 kg).  LABS: CBC:    Component Value Date/Time   WBC 14.3 (H) 03/01/2021 0520   HGB 8.4 (L) 03/01/2021 0520   HGB 13.6 11/04/2013 1650   HCT 25.9 (L) 03/01/2021 0520   HCT 40.2 11/04/2013 1650   PLT 200 03/01/2021 0520  PLT 136 (L) 11/04/2013 1650   MCV 88.1 03/01/2021 0520   MCV 90 11/04/2013 1650   NEUTROABS 20.5 (H) 02/27/2021 0819   NEUTROABS 3.5 10/12/2014 1019   LYMPHSABS 1.1 02/27/2021 0819   LYMPHSABS 3.2 (H) 10/12/2014 1019   MONOABS 1.9 (H) 02/27/2021 0819   EOSABS 0.0 02/27/2021 0819   EOSABS 0.4 10/12/2014 1019   BASOSABS 0.0 02/27/2021 0819   BASOSABS 0.0 10/12/2014 1019   Comprehensive Metabolic Panel:    Component Value Date/Time   NA 139 03/01/2021 0520   NA 142 11/05/2013 0408   K 4.1 03/01/2021 0520   K 3.5 11/05/2013 0408   CL 109 03/01/2021 0520   CL 113 (H) 11/05/2013 0408   CO2 24 03/01/2021 0520   CO2 22 11/05/2013 0408   BUN 23 (H) 03/01/2021 0520   BUN 10 11/05/2013 0408   CREATININE 0.70 03/01/2021 0520   CREATININE 0.61 11/05/2013 0408   GLUCOSE 129 (H) 03/01/2021 0520   GLUCOSE 84 11/05/2013 0408   CALCIUM 8.5 (L) 03/01/2021 0520   CALCIUM 8.3 (L) 11/05/2013 0408   AST 29 02/27/2021 0819   AST 34 11/04/2013 1650   ALT 32 02/27/2021 0819   ALT 43 11/04/2013 1650   ALKPHOS 312 (H) 02/27/2021 0819   ALKPHOS 132 (H) 11/04/2013 1650   BILITOT 0.5 02/27/2021 0819   BILITOT 0.2 11/04/2013 1650   PROT 6.8 02/27/2021 0819   PROT 6.6 10/12/2014 1019   PROT 7.3 11/04/2013 1650   ALBUMIN 3.3 (L) 02/27/2021 0819   ALBUMIN 4.1 10/12/2014 1019   ALBUMIN 3.4 11/04/2013 1650    RADIOGRAPHIC STUDIES: DG Chest 2 View  Result Date: 02/15/2021 CLINICAL DATA:  Weight loss and cough EXAM: CHEST - 2 VIEW COMPARISON:  None. FINDINGS: The heart size and mediastinal contours are within normal limits. Rounded patchy  nodular opacity seen within the right upper lobe and right mid lung. The left lung is clear. No acute osseous abnormality. IMPRESSION: Rounded patchy airspace opacities in the right mid lung which may be due to infectious etiology, however cannot exclude metastatic disease. Would recommend CT chest with contrast for further evaluation. Electronically Signed   By: Prudencio Pair M.D.   On: 02/15/2021 12:16   CT Head Wo Contrast  Result Date: 02/15/2021 CLINICAL DATA:  Headache with left-sided face/neck/head pain. On antibiotics for reported laryngitis with bilateral ear pain. EXAM: CT HEAD WITHOUT CONTRAST CT MAXILLOFACIAL WITHOUT CONTRAST TECHNIQUE: Multidetector CT imaging of the head and maxillofacial structures were performed using the standard protocol without intravenous contrast. Multiplanar CT image reconstructions of the maxillofacial structures were also generated. COMPARISON:  None. FINDINGS: CT HEAD FINDINGS Brain: The calvarial/skull base mass lesion is described below. Spur this process exerts mass effect on the left cerebellum with suspected narrowing of the fourth ventricle. No evidence of hydrocephalus. No evidence of acute large vascular territory infarct. No acute hemorrhage. Mild to moderate scattered white matter hypodensities, which are nonspecific but most likely relate to chronic microvascular ischemic disease. No midline shift. Vascular: No hyperdense vessel identified. Calcific atherosclerosis. Skull/skull base: There is aggressive soft tissue mass lesion involving the left occipital calvarium measuring up to approximately 5.5 x 3.1 by 3.3 cm (transverse by AP by craniocaudal) with bony destruction and expansion and extension inferiorly to involve the left temporal bone and skull base. There is mass effect on the inferior left cerebellum and possible involvement of the subjacent dural venous sinuses. Small left mastoid effusion. CT MAXILLOFACIAL FINDINGS Osseous: No fracture or mandibular  dislocation. No destructive process in the face. Orbits: Negative. No traumatic or inflammatory finding. Sinuses: Small right maxillary sinus retention cyst. Otherwise, sinuses are clear. Soft tissues: Negative. 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 Resulting mass effect on the left cerebellum and possible involvement of the subjacent dural venous sinuses. 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 Osteomyelitis is a differential consideration. Recommend MRI brain with and without contrast to further evaluate. Findings and recommendations discussed with Ashok Cordia, PA via telephone at 11:20 a.m. Electronically Signed   By: Margaretha Sheffield MD   On: 02/15/2021 11:29   CT Angio Chest PE W and/or Wo Contrast  Result Date: 02/27/2021 CLINICAL DATA:  PE suspected, widely metastatic lung cancer EXAM: CT ANGIOGRAPHY CHEST WITH CONTRAST TECHNIQUE: Multidetector CT imaging of the chest was performed using the standard protocol during bolus administration of intravenous contrast. Multiplanar CT image reconstructions and MIPs were obtained to evaluate the vascular anatomy. CONTRAST:  50m OMNIPAQUE IOHEXOL 350 MG/ML SOLN COMPARISON:  02/15/2021 FINDINGS: Cardiovascular: Satisfactory opacification of the pulmonary arteries to the segmental level. No evidence of pulmonary embolism. Cardiomegaly. Left and right coronary artery calcifications and stents. No pericardial effusion. Mediastinum/Nodes: Redemonstrated, very extensive bulky bilateral hilar, mediastinal, supraclavicular, and left axillary lymphadenopathy. Thyroid gland, trachea, and esophagus demonstrate no significant findings. Lungs/Pleura: Diffuse bilateral bronchial wall thickening. Spiculated right upper lobe mass as seen on prior examination (series 7, image 42).  Numerous redemonstrated small pulmonary nodules throughout the lungs. There is new, extensive ground-glass airspace opacity throughout the lungs, somewhat geographic in the upper lobes (series 7, image 33), although somewhat more nodular appearing in the lower lungs (series 7, image 58). No pleural effusion or pneumothorax. Upper Abdomen: No acute abnormality. Multiple low-attenuation lesions of liver, better assessed by prior dedicated of the abdomen. Musculoskeletal: No chest wall abnormality. Multiple osseous metastatic lesions, most significantly a lytic lesion of the T9 vertebral body (series 9, image 58). Review of the MIP images confirms the above findings. IMPRESSION: 1. Negative examination for pulmonary embolism. 2. There is new, extensive ground-glass airspace opacity throughout the lungs, somewhat geographic in the upper lobes, although somewhat more nodular appearing in the lower lungs. Findings are nonspecific and infectious or inflammatory, differential considerations primarily include drug toxicity and atypical/viral infection. 3. Diffuse bilateral bronchial wall thickening, consistent with nonspecific infectious or inflammatory bronchitis. 4. Redemonstrated findings of advanced metastatic lung malignancy, better assessed by recent prior staging examination. 5. Coronary artery disease. Electronically Signed   By: AEddie CandleM.D.   On: 02/27/2021 10:49   CT Thoracic Spine Wo Contrast  Result Date: 02/18/2021 CLINICAL DATA:  Metastases on CT abdomen EXAM: CT THORACIC AND LUMBAR SPINE WITHOUT CONTRAST TECHNIQUE: Multidetector CT imaging of the thoracic and lumbar spine was performed without contrast. Multiplanar CT image reconstructions were also generated. COMPARISON:  None. FINDINGS: CT THORACIC SPINE FINDINGS Alignment: Preserved. Vertebrae: There is a small partially sclerotic lesion at the right aspect of the T5 vertebral body. A lytic lesion is present at the anterior aspect T9 likely with  pathologic fracture but no substantial height loss. Ill-defined lucent lesions are present elsewhere. Paraspinal and other soft tissues: Better evaluated on prior dedicated imaging. Disc levels: Multilevel degenerative changes. No high-grade degenerative canal narrowing. CT LUMBAR SPINE FINDINGS Segmentation: 5 lumbar type vertebrae. Alignment: Dextrocurvature.  Grade 1 anterolisthesis at L4-L5  Vertebrae: Lucency and sclerosis at L2 with pathologic compression fracture resulting in less than 50% loss of height at the superior endplate. Sclerotic lesion of the right sacrum. Paraspinal and other soft tissues: Better evaluated on prior dedicated imaging. Disc levels: Multilevel degenerative changes. Canal stenosis is greatest at L3-L4 and L4-L5. Foraminal narrowing is greatest on the right at L5-S1. IMPRESSION: Sclerotic and lytic metastatic lesions as already identified on the CT chest, abdomen, and pelvis. Pathologic L2 fracture with less than 50% loss of height. Probable pathologic fracture at T9 without height loss. No apparent significant epidural disease by CT. Electronically Signed   By: Macy Mis M.D.   On: 02/18/2021 10:53   CT Lumbar Spine Wo Contrast  Result Date: 02/18/2021 CLINICAL DATA:  Metastases on CT abdomen EXAM: CT THORACIC AND LUMBAR SPINE WITHOUT CONTRAST TECHNIQUE: Multidetector CT imaging of the thoracic and lumbar spine was performed without contrast. Multiplanar CT image reconstructions were also generated. COMPARISON:  None. FINDINGS: CT THORACIC SPINE FINDINGS Alignment: Preserved. Vertebrae: There is a small partially sclerotic lesion at the right aspect of the T5 vertebral body. A lytic lesion is present at the anterior aspect T9 likely with pathologic fracture but no substantial height loss. Ill-defined lucent lesions are present elsewhere. Paraspinal and other soft tissues: Better evaluated on prior dedicated imaging. Disc levels: Multilevel degenerative changes. No high-grade  degenerative canal narrowing. CT LUMBAR SPINE FINDINGS Segmentation: 5 lumbar type vertebrae. Alignment: Dextrocurvature.  Grade 1 anterolisthesis at L4-L5 Vertebrae: Lucency and sclerosis at L2 with pathologic compression fracture resulting in less than 50% loss of height at the superior endplate. Sclerotic lesion of the right sacrum. Paraspinal and other soft tissues: Better evaluated on prior dedicated imaging. Disc levels: Multilevel degenerative changes. Canal stenosis is greatest at L3-L4 and L4-L5. Foraminal narrowing is greatest on the right at L5-S1. IMPRESSION: Sclerotic and lytic metastatic lesions as already identified on the CT chest, abdomen, and pelvis. Pathologic L2 fracture with less than 50% loss of height. Probable pathologic fracture at T9 without height loss. No apparent significant epidural disease by CT. Electronically Signed   By: Macy Mis M.D.   On: 02/18/2021 10:53   MR Brain W and Wo Contrast  Result Date: 02/15/2021 CLINICAL DATA:  Abnormal head CT.  Skull base mass. EXAM: MRI HEAD WITHOUT AND WITH CONTRAST TECHNIQUE: Multiplanar, multiecho pulse sequences of the brain and surrounding structures were obtained without and with intravenous contrast. CONTRAST:  34m GADAVIST GADOBUTROL 1 MMOL/ML IV SOLN COMPARISON:  CT head 02/15/2021 FINDINGS: Brain: Large destructive mass in the posterior skull base bilaterally left greater than right. This involves the occipital bone with extension into the left temporal bone. The occipital bone is significantly expanded due to tumor on the left with mass-effect on the left cerebellum. Soft tissue mass associated with left occipital lesion measures approximately 5.2 x 3.0 cm. Mild edema in the left cerebellum. There is infiltrating tumor in the left temporal bone which is more sizable than would be predicted from the CT. The soft tissue mass associated with the left occipital bone shows susceptibility which may be due to mineralization and/or  bone fragments from bone destruction. Ventricle size normal. No midline shift. Moderate white matter changes likely due to chronic microvascular ischemia. No acute infarct. Chronic microhemorrhage in the left parietal lobe. Subtle enhancing lesions in the brain in the right frontal lobe measuring approximately 10 mm, and 4 mm best seen on coronal and sagittal postcontrast imaging. This is suspicious for metastatic disease. Vascular:  Normal arterial flow voids. Normal venous enhancement. No evidence of venous sinus thrombosis. Skull and upper cervical spine: Infiltrating destructive mass in the occipital bone bilaterally left greater than right with extension into the left temporal bone. Additional lesions in the frontal bone bilaterally and in the left parietal bone likely metastatic disease. Sinuses/Orbits: Mild mucosal edema paranasal sinuses. Negative orbit Other: None IMPRESSION: Destructive mass lesion in the occipital bone bilaterally left greater than right. There is associated soft tissue mass in the left occipital bone extending into the posterior fossa with mass-effect and mild edema of the left cerebellum. This mass extends into the left temporal bone. Additional bone lesions are present in the frontal bones and left parietal bone. Subtle enhancing lesions right frontal lobe measuring 10 mm and 4 mm. Findings compatible with metastatic disease. Electronically Signed   By: Franchot Gallo M.D.   On: 02/15/2021 13:47   NM Bone Scan Whole Body  Result Date: 02/27/2021 CLINICAL DATA:  Metastatic disease evaluation, lung mass, abnormal CT EXAM: NUCLEAR MEDICINE WHOLE BODY BONE SCAN TECHNIQUE: Whole body anterior and posterior images were obtained approximately 3 hours after intravenous injection of radiopharmaceutical. RADIOPHARMACEUTICALS:  19.79 mCi Technetium-57mMDP IV COMPARISON:  None Correlation: CT chest abdomen pelvis 02/15/2021, CT thoracic and lumbar spine 02/18/2021, CT head 02/15/2021 FINDINGS:  Multiple sites of abnormal tracer uptake are identified consistent with widespread osseous metastatic disease. These include calvaria greatest at occipital bone growth more on LEFT, thoracic and lumbar spine, RIGHT ribs, and pelvis. Dextroconvex thoracolumbar scoliosis. No definite abnormal tracer uptake within the humeri or femora. Expected urinary tract and soft tissue distribution of tracer. IMPRESSION: Scattered osseous metastases as above. Electronically Signed   By: MLavonia DanaM.D.   On: 02/27/2021 18:17   [place]  Result Date: 02/18/2021 CLINICAL DATA:  Back pain. Right renal pelvis fullness on right upper quadrant ultrasound performed 1 day prior EXAM: RENAL / URINARY TRACT ULTRASOUND COMPLETE COMPARISON:  02/17/2021 abdominal sonogram. FINDINGS: Right Kidney: Renal measurements: 10.3 x 4.2 x 4.2 cm = volume: 95 mL. Normal parenchymal echogenicity and thickness. Mild right hydronephrosis. Possible 5 mm right ureteropelvic junction stone. Nonobstructing 3 mm lower right renal stone. No renal masses. Left Kidney: Renal measurements: 10.0 x 4.8 x 4.2 cm = volume: 107 mL. Normal parenchymal echogenicity and thickness. No left hydronephrosis. No renal masses. Probable nonobstructing 7 mm upper left renal stone. Bladder: Appears normal for degree of bladder distention. Bilateral ureteral jets seen in the bladder. Other: None. IMPRESSION: 1. Mild right hydronephrosis. Possible 5 mm right UPJ stone. Nonobstructing 3 mm lower right renal stone. Suggest unenhanced CT abdomen/pelvis for further evaluation. 2. Probable nonobstructing upper left renal stone. Electronically Signed   By: JIlona SorrelM.D.   On: 02/18/2021 12:02   CT CHEST ABDOMEN PELVIS W CONTRAST  Result Date: 02/15/2021 CLINICAL DATA:  Altered mental status. 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 EXAM: CT CHEST, ABDOMEN, AND PELVIS WITH CONTRAST TECHNIQUE: Multidetector CT imaging of the chest, abdomen and pelvis was  performed following the standard protocol during bolus administration of intravenous contrast. CONTRAST:  75 mL OMNIPAQUE IOHEXOL 300 MG/ML  SOLN COMPARISON:  None. FINDINGS: CT CHEST FINDINGS Cardiovascular: No significant vascular findings. Normal heart size. No pericardial effusion. Mediastinum/Nodes: The patient has extensive lymphadenopathy. A 2 cm left axillary node is seen on image 11 of series 2; a second left axillary node on image 14 measures 1.4 cm. A 1.1 cm subpectoral node is seen on  the left on image 15 of series 2. A 1.4 cm left supraclavicular node is seen on image 9. Right paratracheal node on image 15 measures 1.9 cm. Right precarinal nodal mass on image 25 measures 2.6 cm and there is a right hilar nodal mass measuring 4.2 x 3.1 cm on image 30. The esophagus and thyroid are negative. Lungs/Pleura: Small right pleural effusion. A spiculated nodule in the right middle lobe measures 2.6 cm transverse by 2.7 cm AP on image 77, series 4. Scattered pulmonary nodules include a 0.5 cm nodule in the superior segment of the left lower lobe on image 80, 0.4 cm left upper lobe nodule on image 76 and 2 punctate nodules on image 83. Additional smaller nodules are seen scattered through both lungs. The lungs are emphysematous. No pleural effusion. Musculoskeletal: A 0.9 cm lytic lesion is seen in T5, lytic lesions are seen in T7 and T9, larger in T9. There is also a small lytic lesion in T10. CT ABDOMEN PELVIS FINDINGS Hepatobiliary: Metastatic lesions are seen in the left hepatic lobe measuring 1.7 cm on image 57, series 2 and near the dome of the liver on image 50 of series 2 where a 1.1 cm lesion is identified. A second lesion near the dome of the liver in the right hepatic lobe on image 50 measures 0.7 cm. There is mild intra and extrahepatic biliary ductal dilatation likely related to prior cholecystectomy. Pancreas: Unremarkable. No pancreatic ductal dilatation or surrounding inflammatory changes. Spleen:  Normal in size without focal abnormality. Adrenals/Urinary Tract: Adrenal glands are unremarkable. Kidneys are normal, without renal calculi, focal lesion, or hydronephrosis. Bladder is unremarkable. Stomach/Bowel: Stomach is within normal limits. Appendix appears normal. No evidence of bowel wall thickening, distention, or inflammatory changes. Moderately large colonic stool burden throughout noted. Vascular/Lymphatic: Aortic atherosclerosis. No aneurysm. No pathologic lymphadenopathy by CT size criteria. Reproductive: Status post hysterectomy. No adnexal masses. Other: None. Musculoskeletal: Mottled appearance of the L2 vertebral body is consistent with metastatic disease. There is a mild pathologic superior endplate compression fracture of L2 with vertebral body height loss centrally of approximately 30%. IMPRESSION: Findings consistent with extensive metastatic carcinoma likely emanating from a 2.7 cm right middle lobe pulmonary nodule. Metastases include extensive lymphadenopathy in the chest,, pulmonary nodule bone lesions and liver lesions. Mild pathologic fracture of L2 with vertebral body height loss centrally of up to approximately 30% is again noted. Aortic Atherosclerosis (ICD10-I70.0) and Emphysema (ICD10-J43.9). Electronically Signed   By: Inge Rise M.D.   On: 02/15/2021 14:25   DG Chest Port 1 View  Result Date: 02/27/2021 CLINICAL DATA:  Worsening shortness of breath since this morning. Recent diagnosis of lung cancer. EXAM: PORTABLE CHEST 1 VIEW COMPARISON:  Radiograph earlier today.  CT earlier today. FINDINGS: Stable heart size and mediastinal contours. Right hilar prominence and thickening of the lower paratracheal stripe related to known adenopathy. Unchanged right mid lung pulmonary nodule. Mild patchy bilateral suprahilar opacities corresponding to ground-glass opacity on CT earlier today. No significant change in the interim. No pneumothorax or large pleural effusion. Retained  excreted IV contrast in the right renal collecting system with right hydronephrosis, partially included in the upper abdomen. IMPRESSION: 1. Stable radiographic appearance of the chest from earlier today. Similar patchy suprahilar opacities corresponding to ground-glass opacity on CT. 2. Right mid lung pulmonary nodule and thoracic adenopathy. 3. Partially included in the upper abdomen is retained excreted contrast in the right renal collecting system with right hydronephrosis. Right hydronephrosis was seen on renal  ultrasound 02/18/2021. Electronically Signed   By: Keith Rake M.D.   On: 02/27/2021 15:04   DG Chest Portable 1 View  Result Date: 02/27/2021 CLINICAL DATA:  60 year old female with shortness of breath and cough. Metastatic lung cancer. EXAM: PORTABLE CHEST 1 VIEW COMPARISON:  02/15/2021 and prior studies FINDINGS: New hazy opacities within the central/upper lungs noted. RIGHT middle lobe mass and small scattered pulmonary nodules bilaterally are again identified. No pleural effusion or pneumothorax. No acute bony abnormalities are identified. IMPRESSION: New hazy opacities within the central/upper lungs which may represent edema or infection. Unchanged RIGHT middle lobe mass and bilateral pulmonary nodules compatible with known malignancy/metastases. Electronically Signed   By: Margarette Canada M.D.   On: 02/27/2021 08:49   [place] CORE BIOPSY (LYMPH NODES)  Result Date: 02/21/2021 INDICATION: Multiple enlarged lymph nodes, right middle lobe lung mass, liver lesions and bone lesions. EXAM: ULTRASOUND GUIDED CORE BIOPSY OF LEFT SUPRACLAVICULAR LYMPH NODE MEDICATIONS: None. ANESTHESIA/SEDATION: Versed 2.0 mg IV Moderate Sedation Time:  12 minutes. The patient was continuously monitored during the procedure by the interventional radiology nurse under my direct supervision. PROCEDURE: The procedure, risks, benefits, and alternatives were explained to the patient. Questions regarding the procedure were  encouraged and answered. The patient understands and consents to the procedure. A time-out was performed prior to initiating the procedure. The left neck was prepped with chlorhexidine in a sterile fashion, and a sterile drape was applied covering the operative field. A sterile gown and sterile gloves were used for the procedure. Local anesthesia was provided with 1% Lidocaine. After localizing an enlarged left supraclavicular lymph node by ultrasound, multiple 18 gauge core biopsy samples were obtained. Core biopsy samples were submitted in formalin as well as on saline soaked Telfa gauze. Additional ultrasound was performed. COMPLICATIONS: None immediate. FINDINGS: An enlarged left supraclavicular lymph node measures approximately 3.0 x 1.8 x 2.9 cm. Solid core biopsy samples were obtained. IMPRESSION: Ultrasound-guided core biopsy performed of an enlarged left supraclavicular lymph node measuring 3 cm in greatest dimensions. Electronically Signed   By: Aletta Edouard M.D.   On: 02/21/2021 15:31   CT Maxillofacial Wo Contrast  Result Date: 02/15/2021 CLINICAL DATA:  Headache with left-sided face/neck/head pain. On antibiotics for reported laryngitis with bilateral ear pain. EXAM: CT HEAD WITHOUT CONTRAST CT MAXILLOFACIAL WITHOUT CONTRAST TECHNIQUE: Multidetector CT imaging of the head and maxillofacial structures were performed using the standard protocol without intravenous contrast. Multiplanar CT image reconstructions of the maxillofacial structures were also generated. COMPARISON:  None. FINDINGS: CT HEAD FINDINGS Brain: The calvarial/skull base mass lesion is described below. Spur this process exerts mass effect on the left cerebellum with suspected narrowing of the fourth ventricle. No evidence of hydrocephalus. No evidence of acute large vascular territory infarct. No acute hemorrhage. Mild to moderate scattered white matter hypodensities, which are nonspecific but most likely relate to chronic  microvascular ischemic disease. No midline shift. Vascular: No hyperdense vessel identified. Calcific atherosclerosis. Skull/skull base: There is aggressive soft tissue mass lesion involving the left occipital calvarium measuring up to approximately 5.5 x 3.1 by 3.3 cm (transverse by AP by craniocaudal) with bony destruction and expansion and extension inferiorly to involve the left temporal bone and skull base. There is mass effect on the inferior left cerebellum and possible involvement of the subjacent dural venous sinuses. Small left mastoid effusion. CT MAXILLOFACIAL FINDINGS Osseous: No fracture or mandibular dislocation. No destructive process in the face. Orbits: Negative. No traumatic or inflammatory finding. Sinuses: Small right maxillary  sinus retention cyst. Otherwise, sinuses are clear. Soft tissues: Negative. 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 Resulting mass effect on the left cerebellum and possible involvement of the subjacent dural venous sinuses. 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 Osteomyelitis is a differential consideration. Recommend MRI brain with and without contrast to further evaluate. Findings and recommendations discussed with Ashok Cordia, PA via telephone at 11:20 a.m. Electronically Signed   By: Margaretha Sheffield MD   On: 02/15/2021 11:29   US Abdomen Limited RUQ (LIVER/GB)  Result Date: 02/17/2021 CLINICAL DATA:  Nausea and vomiting for 1 day. EXAM: ULTRASOUND ABDOMEN LIMITED RIGHT UPPER QUADRANT COMPARISON:  CT chest, abdomen and pelvis 02/15/2021. FINDINGS: Gallbladder: Removed. Common bile duct: Diameter: 0.8 cm. Liver: Three hypoechoic liver lesions measuring up to 1.8 cm are identified are identified and correlate with metastatic deposit seen on prior CT. Mild intrahepatic  biliary ductal dilatation seen on CT is also noted. Portal vein is patent on color Doppler imaging with normal direction of blood flow towards the liver. Other: There is fullness of the right renal pelvis which is new since the patient's CT scan yesterday. IMPRESSION: Three hypoechoic liver lesions measuring up to 1.8 cm correlate with metastatic deposit seen on CT. Status post cholecystectomy. Mild intra and extrahepatic biliary ductal dilatation is likely related to cholecystectomy. No obstructing lesion is identified. Fullness of the right renal pelvis is new since yesterday's examination. This could be incidental. Correlation with dedicated renal ultrasound could be used for further evaluation if indicated. Electronically Signed   By: Inge Rise M.D.   On: 02/17/2021 12:53    PERFORMANCE STATUS (ECOG) : 1 - Symptomatic but completely ambulatory  Review of Systems Unless otherwise noted, a complete review of systems is negative.  Physical Exam General: NAD Pulmonary: Unlabored Extremities: no edema, no joint deformities Skin: no rashes Neurological: Weakness but otherwise nonfocal  IMPRESSION: I met with patient and sister today to discuss goals.  Patient has a sitter at bedside due to agitation overnight.  Patient verbalized agreement with current scope of treatment.  Is her hope to be discharged home when she is medically ready so that she can pursue cancer treatment.  Patient recognizes that her current infection might delay the start of systemic chemotherapy.  However, patient will have follow-up in the cancer center to discuss options.  Symptomatically, patient feels better this morning.  She reports improved dyspnea.  Pain is stable on current regimen.  Patient has previously stated that she wants her sister to be involved in decision-making and to serve as healthcare proxy if needed.  They have been provided with ACP documents.  We discussed CODE STATUS at length today.  Patient  verbalized understanding that resuscitation would likely prove futile in the setting of stage IV cancer.  However, she wants some time to think about decisions.  PLAN: -Continue current scope of treatment -Continue transdermal fentanyl/Percocet -Daily bowel regimen -Will plan to follow patient in the clinic after discharge from the hospital   Case and plan discussed with Dr. Tasia Catchings   Time Total: 60 minutes  Visit consisted of counseling and education dealing with the complex and emotionally intense issues of symptom management and palliative care in the setting of serious and potentially life-threatening illness.Greater than 50%  of this time was spent counseling and coordinating care related to  the above assessment and plan.  Signed by: Altha Harm, PhD, NP-C

## 2021-03-01 NOTE — Progress Notes (Signed)
Hematology/Oncology Progress Note [facility] [phone] [phone]  Patient Care Team: Remi Haggard, FNP as PCP - General (Family Medicine) Remi Haggard, FNP (Family Medicine) Christene Lye, MD (General Surgery) Telford Nab, RN as Oncology Nurse Navigator   Name of the patient: Brenda Middleton  [id number]  17-Aug-1961  Date of visit: 03/01/21   INTERVAL HISTORY-  Patient is eating lunch.  Pain is well controlled. Continues to have cough, shortness of breath, hoarseness No nausea vomiting diarrhea.   Review of systems- Review of Systems  Constitutional: Positive for fatigue. Negative for appetite change, chills and fever.  HENT:   Positive for voice change. Negative for hearing loss.   Eyes: Negative for eye problems.  Respiratory: Positive for cough and shortness of breath. Negative for chest tightness.   Cardiovascular: Negative for chest pain.  Gastrointestinal: Negative for abdominal distention, abdominal pain and blood in stool.  Endocrine: Negative for hot flashes.  Genitourinary: Negative for difficulty urinating and frequency.   Musculoskeletal: Negative for arthralgias.  Skin: Negative for itching and rash.  Neurological: Negative for extremity weakness.  Hematological: Negative for adenopathy.  Psychiatric/Behavioral: Negative for confusion.    Allergies  Allergen Reactions  . Penicillin G Hives    Other reaction(s): HIVES Other reaction(s): HIVES  . Penicillin V Itching  . Penicillins     Patient Active Problem List   Diagnosis Date Noted  . HCAP (healthcare-associated pneumonia) 02/27/2021  . HTN (hypertension) 02/27/2021  . Depression with anxiety 02/27/2021  . Normocytic anemia 02/27/2021  . Sepsis ([place]) 02/27/2021  . Protein-calorie malnutrition, moderate ([place]) 02/27/2021  . Primary malignant neoplasm of lung metastatic to other site ([place]) 02/27/2021  . Elevated troponin 02/27/2021  .  Acute metabolic encephalopathy 84/69/6295  . Brain metastases ([place]) 02/27/2021  . Shortness of breath   . Metastatic malignant neoplasm ([place])   . Intractable low back pain 02/18/2021  . Anxiety   . Depressive disorder   . Pathologic fracture of lumbar vertebra, initial encounter   . 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 COPD exacerbation ([place])   . GERD (gastroesophageal reflux disease)   . Palliative care encounter   . Pathologic compression fracture of lumbar vertebra ([place])   . Goals of care, counseling/discussion   . Neoplasm related pain   . Iron deficiency anemia due to chronic blood loss 09/12/2019  . Family history of cancer 09/12/2019  . Blood in the stool 09/12/2019  . Elevated alkaline phosphatase level 09/12/2019  . Chronic low back pain 08/26/2014     Past Medical History:  Diagnosis Date  . Anxiety   . Benign neoplasm of cervix uteri   . Chronic hepatitis C without mention of hepatic coma   . Chronic low back pain 08/26/2014  . Constipation   . Depressive disorder   . Displacement of cervical intervertebral disc   . Hypertensive disorder   . Iron deficiency anemia due to chronic blood loss 09/12/2019  . Palpitations   . Prolapsed cervical intervertebral disc      Past Surgical History:  Procedure Laterality Date  . CONIZATION CERVIX    . DILATION AND CURETTAGE OF UTERUS    . HAND SURGERY  2008,2015   Carpel Tunnel  . TONSILLECTOMY    . VULVECTOMY      Social History   Socioeconomic History  . Marital status: Single    Spouse name: Not on file  . Number of children: 0  . Years  of education: college1  . Highest education level: Not on file  Occupational History    Employer: UNEMPLOYED  Tobacco Use  . Smoking status: Former Smoker    Packs/day: 0.70    Years: 35.00    Pack years: 24.50    Types: Cigarettes  . Smokeless tobacco: Never Used  Vaping Use  . Vaping Use: Never used  Substance and Sexual Activity  . Alcohol use: Yes    Comment:  occasional wine  . Drug use: No  . Sexual activity: Not Currently  Other Topics Concern  . Not on file  Social History Narrative  . Not on file   Social Determinants of Health   Financial Resource Strain: Not on file  Food Insecurity: Not on file  Transportation Needs: Not on file  Physical Activity: Not on file  Stress: Not on file  Social Connections: Not on file  Intimate Partner Violence: Not on file     Family History  Problem Relation Age of Onset  . Breast cancer Mother 10  . Lung cancer Father   . Cancer Brother   . Cancer Other   . Stroke Other      Current Facility-Administered Medications:  .  acetaminophen (TYLENOL) tablet 650 mg, 650 mg, Oral, Q6H PRN, Ivor Costa, MD .  albuterol (PROVENTIL) (2.5 MG/3ML) 0.083% nebulizer solution 2.5 mg, 2.5 mg, Nebulization, Q4H PRN, Ivor Costa, MD .  benzonatate (TESSALON) capsule 100 mg, 100 mg, Oral, BID PRN, Regalado, Belkys A, MD .  busPIRone (BUSPAR) tablet 10 mg, 10 mg, Oral, BID, Ivor Costa, MD, 10 mg at 03/01/21 1009 .  ceFEPIme (MAXIPIME) 2 g in sodium chloride 0.9 % 100 mL IVPB, 2 g, Intravenous, Q8H, Dallie Piles, RPH, Last Rate: 200 mL/hr at 03/01/21 1417, 2 g at 03/01/21 1417 .  feeding supplement (BOOST / RESOURCE BREEZE) liquid 1 Container, 1 Container, Oral, TID BM, Regalado, Belkys A, MD, 1 Container at 03/01/21 1011 .  fentaNYL (DURAGESIC) 50 MCG/HR 1 patch, 1 patch, Transdermal, Q72H, Ivor Costa, MD, 1 patch at 02/28/21 0827 .  gabapentin (NEURONTIN) capsule 300 mg, 300 mg, Oral, Daily, Ivor Costa, MD, 300 mg at 03/01/21 1009 .  guaiFENesin (MUCINEX) 12 hr tablet 1,200 mg, 1,200 mg, Oral, BID, Regalado, Belkys A, MD, 1,200 mg at 03/01/21 1009 .  hydrALAZINE (APRESOLINE) injection 5 mg, 5 mg, Intravenous, Q2H PRN, Ivor Costa, MD .  hydrOXYzine (ATARAX/VISTARIL) tablet 25 mg, 25 mg, Oral, TID PRN, Ivor Costa, MD, 25 mg at 02/28/21 1258 .  ipratropium-albuterol (DUONEB) 0.5-2.5 (3) MG/3ML nebulizer solution 3  mL, 3 mL, Nebulization, TID, Ivor Costa, MD, 3 mL at 03/01/21 1300 .  levETIRAcetam (KEPPRA) tablet 500 mg, 500 mg, Oral, BID, Regalado, Belkys A, MD, 500 mg at 02/28/21 2300 .  LORazepam (ATIVAN) tablet 1 mg, 1 mg, Oral, Q6H PRN, Regalado, Belkys A, MD, 1 mg at 02/28/21 1805 .  methylPREDNISolone sodium succinate (SOLU-MEDROL) 125 mg/2 mL injection 60 mg, 60 mg, Intravenous, Q8H, Regalado, Belkys A, MD, 60 mg at 03/01/21 1413 .  mometasone-formoterol (DULERA) 100-5 MCG/ACT inhaler 2 puff, 2 puff, Inhalation, BID, Ivor Costa, MD, 2 puff at 03/01/21 0740 .  multivitamin with minerals tablet 1 tablet, 1 tablet, Oral, Daily, Regalado, Belkys A, MD, 1 tablet at 03/01/21 1009 .  nicotine (NICODERM CQ - dosed in mg/24 hours) patch 21 mg, 21 mg, Transdermal, Daily, Regalado, Belkys A, MD, 21 mg at 02/28/21 1900 .  ondansetron (ZOFRAN) injection 4 mg, 4 mg, Intravenous, Q8H  PRN, Ivor Costa, MD .  oxyCODONE-acetaminophen (PERCOCET/ROXICET) 5-325 MG per tablet 1 tablet, 1 tablet, Oral, Q6H PRN, Ivor Costa, MD, 1 tablet at 03/01/21 1008 .  pantoprazole (PROTONIX) injection 40 mg, 40 mg, Intravenous, Q12H, Regalado, Belkys A, MD, 40 mg at 03/01/21 1009 .  polyethylene glycol (MIRALAX / GLYCOLAX) packet 17 g, 17 g, Oral, Daily, Regalado, Belkys A, MD .  QUEtiapine (SEROQUEL) tablet 400 mg, 400 mg, Oral, QHS, Ivor Costa, MD, 400 mg at 02/28/21 2007 .  vancomycin (VANCOCIN) IVPB 1000 mg/200 mL premix, 1,000 mg, Intravenous, Q24H, Dallie Piles, RPH, Stopped at 03/01/21 0030   Physical exam:  Vitals:   03/01/21 0523 03/01/21 0725 03/01/21 0853 03/01/21 1300  BP: 119/64  134/80   Pulse: 92  (!) 102   Resp: 18  20   Temp: 98.4 F (36.9 C)  99.6 F (37.6 C)   TempSrc:   Oral   SpO2: 90% 96% 94% 93%  Weight:      Height:       Physical Exam Constitutional:      General: She is not in acute distress.    Appearance: She is not diaphoretic.  HENT:     Head: Normocephalic and atraumatic.     Nose: Nose  normal.     Mouth/Throat:     Pharynx: No oropharyngeal exudate.  Eyes:     General: No scleral icterus.    Pupils: Pupils are equal, round, and reactive to light.  Cardiovascular:     Rate and Rhythm: Normal rate and regular rhythm.     Heart sounds: No murmur heard.   Pulmonary:     Effort: Pulmonary effort is normal. No respiratory distress.     Breath sounds: No rales.     Comments: Crackles at base Chest:     Chest wall: No tenderness.  Abdominal:     General: There is no distension.     Palpations: Abdomen is soft.     Tenderness: There is no abdominal tenderness.  Musculoskeletal:        General: Normal range of motion.     Cervical back: Normal range of motion and neck supple.  Skin:    General: Skin is warm and dry.     Findings: No erythema.  Neurological:     Mental Status: She is alert and oriented to person, place, and time.     Cranial Nerves: No cranial nerve deficit.     Motor: No abnormal muscle tone.     Coordination: Coordination normal.  Psychiatric:        Mood and Affect: Affect normal.        CMP Latest Ref Rng & Units 03/01/2021  Glucose 70 - 99 mg/dL 129(H)  BUN 6 - 20 mg/dL 23(H)  Creatinine 0.44 - 1.00 mg/dL 0.70  Sodium 135 - 145 mmol/L 139  Potassium 3.5 - 5.1 mmol/L 4.1  Chloride 98 - 111 mmol/L 109  CO2 22 - 32 mmol/L 24  Calcium 8.9 - 10.3 mg/dL 8.5(L)  Total Protein 6.5 - 8.1 g/dL -  Total Bilirubin 0.3 - 1.2 mg/dL -  Alkaline Phos 38 - 126 U/L -  AST 15 - 41 U/L -  ALT 0 - 44 U/L -   CBC Latest Ref Rng & Units 03/01/2021  WBC 4.0 - 10.5 K/uL 19.5(H)  Hemoglobin 12.0 - 15.0 g/dL 9.6(L)  Hematocrit 36.0 - 46.0 % 28.9(L)  Platelets 150 - 400 K/uL 235    RADIOGRAPHIC STUDIES: I  have personally reviewed the radiological images as listed and agreed with the findings in the report. DG Chest 2 View  Result Date: 02/15/2021 CLINICAL DATA:  Weight loss and cough EXAM: CHEST - 2 VIEW COMPARISON:  None. FINDINGS: The heart size and  mediastinal contours are within normal limits. Rounded patchy nodular opacity seen within the right upper lobe and right mid lung. The left lung is clear. No acute osseous abnormality. IMPRESSION: Rounded patchy airspace opacities in the right mid lung which may be due to infectious etiology, however cannot exclude metastatic disease. Would recommend CT chest with contrast for further evaluation. Electronically Signed   By: Prudencio Pair M.D.   On: 02/15/2021 12:16   CT Head Wo Contrast  Result Date: 02/15/2021 CLINICAL DATA:  Headache with left-sided face/neck/head pain. On antibiotics for reported laryngitis with bilateral ear pain. EXAM: CT HEAD WITHOUT CONTRAST CT MAXILLOFACIAL WITHOUT CONTRAST TECHNIQUE: Multidetector CT imaging of the head and maxillofacial structures were performed using the standard protocol without intravenous contrast. Multiplanar CT image reconstructions of the maxillofacial structures were also generated. COMPARISON:  None. FINDINGS: CT HEAD FINDINGS Brain: The calvarial/skull base mass lesion is described below. Spur this process exerts mass effect on the left cerebellum with suspected narrowing of the fourth ventricle. No evidence of hydrocephalus. No evidence of acute large vascular territory infarct. No acute hemorrhage. Mild to moderate scattered white matter hypodensities, which are nonspecific but most likely relate to chronic microvascular ischemic disease. No midline shift. Vascular: No hyperdense vessel identified. Calcific atherosclerosis. Skull/skull base: There is aggressive soft tissue mass lesion involving the left occipital calvarium measuring up to approximately 5.5 x 3.1 by 3.3 cm (transverse by AP by craniocaudal) with bony destruction and expansion and extension inferiorly to involve the left temporal bone and skull base. There is mass effect on the inferior left cerebellum and possible involvement of the subjacent dural venous sinuses. Small left mastoid effusion.  CT MAXILLOFACIAL FINDINGS Osseous: No fracture or mandibular dislocation. No destructive process in the face. Orbits: Negative. No traumatic or inflammatory finding. Sinuses: Small right maxillary sinus retention cyst. Otherwise, sinuses are clear. Soft tissues: Negative. 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 Resulting mass effect on the left cerebellum and possible involvement of the subjacent dural venous sinuses. 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 Osteomyelitis is a differential consideration. Recommend MRI brain with and without contrast to further evaluate. Findings and recommendations discussed with Ashok Cordia, PA via telephone at 11:20 a.m. Electronically Signed   By: Margaretha Sheffield MD   On: 02/15/2021 11:29   CT Angio Chest PE W and/or Wo Contrast  Result Date: 02/27/2021 CLINICAL DATA:  PE suspected, widely metastatic lung cancer EXAM: CT ANGIOGRAPHY CHEST WITH CONTRAST TECHNIQUE: Multidetector CT imaging of the chest was performed using the standard protocol during bolus administration of intravenous contrast. Multiplanar CT image reconstructions and MIPs were obtained to evaluate the vascular anatomy. CONTRAST:  17mL OMNIPAQUE IOHEXOL 350 MG/ML SOLN COMPARISON:  02/15/2021 FINDINGS: Cardiovascular: Satisfactory opacification of the pulmonary arteries to the segmental level. No evidence of pulmonary embolism. Cardiomegaly. Left and right coronary artery calcifications and stents. No pericardial effusion. Mediastinum/Nodes: Redemonstrated, very extensive bulky bilateral hilar, mediastinal, supraclavicular, and left axillary lymphadenopathy. Thyroid gland, trachea, and esophagus demonstrate no significant findings. Lungs/Pleura: Diffuse bilateral bronchial wall thickening. Spiculated right upper  lobe  mass as seen on prior examination (series 7, image 42). Numerous redemonstrated small pulmonary nodules throughout the lungs. There is new, extensive ground-glass airspace opacity throughout the lungs, somewhat geographic in the upper lobes (series 7, image 33), although somewhat more nodular appearing in the lower lungs (series 7, image 58). No pleural effusion or pneumothorax. Upper Abdomen: No acute abnormality. Multiple low-attenuation lesions of liver, better assessed by prior dedicated of the abdomen. Musculoskeletal: No chest wall abnormality. Multiple osseous metastatic lesions, most significantly a lytic lesion of the T9 vertebral body (series 9, image 58). Review of the MIP images confirms the above findings. IMPRESSION: 1. Negative examination for pulmonary embolism. 2. There is new, extensive ground-glass airspace opacity throughout the lungs, somewhat geographic in the upper lobes, although somewhat more nodular appearing in the lower lungs. Findings are nonspecific and infectious or inflammatory, differential considerations primarily include drug toxicity and atypical/viral infection. 3. Diffuse bilateral bronchial wall thickening, consistent with nonspecific infectious or inflammatory bronchitis. 4. Redemonstrated findings of advanced metastatic lung malignancy, better assessed by recent prior staging examination. 5. Coronary artery disease. Electronically Signed   By: Eddie Candle M.D.   On: 02/27/2021 10:49   CT Thoracic Spine Wo Contrast  Result Date: 02/18/2021 CLINICAL DATA:  Metastases on CT abdomen EXAM: CT THORACIC AND LUMBAR SPINE WITHOUT CONTRAST TECHNIQUE: Multidetector CT imaging of the thoracic and lumbar spine was performed without contrast. Multiplanar CT image reconstructions were also generated. COMPARISON:  None. FINDINGS: CT THORACIC SPINE FINDINGS Alignment: Preserved. Vertebrae: There is a small partially sclerotic lesion at the right aspect of the T5 vertebral body. A  lytic lesion is present at the anterior aspect T9 likely with pathologic fracture but no substantial height loss. Ill-defined lucent lesions are present elsewhere. Paraspinal and other soft tissues: Better evaluated on prior dedicated imaging. Disc levels: Multilevel degenerative changes. No high-grade degenerative canal narrowing. CT LUMBAR SPINE FINDINGS Segmentation: 5 lumbar type vertebrae. Alignment: Dextrocurvature.  Grade 1 anterolisthesis at L4-L5 Vertebrae: Lucency and sclerosis at L2 with pathologic compression fracture resulting in less than 50% loss of height at the superior endplate. Sclerotic lesion of the right sacrum. Paraspinal and other soft tissues: Better evaluated on prior dedicated imaging. Disc levels: Multilevel degenerative changes. Canal stenosis is greatest at L3-L4 and L4-L5. Foraminal narrowing is greatest on the right at L5-S1. IMPRESSION: Sclerotic and lytic metastatic lesions as already identified on the CT chest, abdomen, and pelvis. Pathologic L2 fracture with less than 50% loss of height. Probable pathologic fracture at T9 without height loss. No apparent significant epidural disease by CT. Electronically Signed   By: Macy Mis M.D.   On: 02/18/2021 10:53   CT Lumbar Spine Wo Contrast  Result Date: 02/18/2021 CLINICAL DATA:  Metastases on CT abdomen EXAM: CT THORACIC AND LUMBAR SPINE WITHOUT CONTRAST TECHNIQUE: Multidetector CT imaging of the thoracic and lumbar spine was performed without contrast. Multiplanar CT image reconstructions were also generated. COMPARISON:  None. FINDINGS: CT THORACIC SPINE FINDINGS Alignment: Preserved. Vertebrae: There is a small partially sclerotic lesion at the right aspect of the T5 vertebral body. A lytic lesion is present at the anterior aspect T9 likely with pathologic fracture but no substantial height loss. Ill-defined lucent lesions are present elsewhere. Paraspinal and other soft tissues: Better evaluated on prior dedicated imaging.  Disc levels: Multilevel degenerative changes. No high-grade degenerative canal narrowing. CT LUMBAR SPINE FINDINGS Segmentation: 5 lumbar type vertebrae. Alignment: Dextrocurvature.  Grade 1 anterolisthesis at L4-L5 Vertebrae: Lucency and sclerosis at L2 with  pathologic compression fracture resulting in less than 50% loss of height at the superior endplate. Sclerotic lesion of the right sacrum. Paraspinal and other soft tissues: Better evaluated on prior dedicated imaging. Disc levels: Multilevel degenerative changes. Canal stenosis is greatest at L3-L4 and L4-L5. Foraminal narrowing is greatest on the right at L5-S1. IMPRESSION: Sclerotic and lytic metastatic lesions as already identified on the CT chest, abdomen, and pelvis. Pathologic L2 fracture with less than 50% loss of height. Probable pathologic fracture at T9 without height loss. No apparent significant epidural disease by CT. Electronically Signed   By: Macy Mis M.D.   On: 02/18/2021 10:53   MR Brain W and Wo Contrast  Result Date: 02/15/2021 CLINICAL DATA:  Abnormal head CT.  Skull base mass. EXAM: MRI HEAD WITHOUT AND WITH CONTRAST TECHNIQUE: Multiplanar, multiecho pulse sequences of the brain and surrounding structures were obtained without and with intravenous contrast. CONTRAST:  48mL GADAVIST GADOBUTROL 1 MMOL/ML IV SOLN COMPARISON:  CT head 02/15/2021 FINDINGS: Brain: Large destructive mass in the posterior skull base bilaterally left greater than right. This involves the occipital bone with extension into the left temporal bone. The occipital bone is significantly expanded due to tumor on the left with mass-effect on the left cerebellum. Soft tissue mass associated with left occipital lesion measures approximately 5.2 x 3.0 cm. Mild edema in the left cerebellum. There is infiltrating tumor in the left temporal bone which is more sizable than would be predicted from the CT. The soft tissue mass associated with the left occipital bone shows  susceptibility which may be due to mineralization and/or bone fragments from bone destruction. Ventricle size normal. No midline shift. Moderate white matter changes likely due to chronic microvascular ischemia. No acute infarct. Chronic microhemorrhage in the left parietal lobe. Subtle enhancing lesions in the brain in the right frontal lobe measuring approximately 10 mm, and 4 mm best seen on coronal and sagittal postcontrast imaging. This is suspicious for metastatic disease. Vascular: Normal arterial flow voids. Normal venous enhancement. No evidence of venous sinus thrombosis. Skull and upper cervical spine: Infiltrating destructive mass in the occipital bone bilaterally left greater than right with extension into the left temporal bone. Additional lesions in the frontal bone bilaterally and in the left parietal bone likely metastatic disease. Sinuses/Orbits: Mild mucosal edema paranasal sinuses. Negative orbit Other: None IMPRESSION: Destructive mass lesion in the occipital bone bilaterally left greater than right. There is associated soft tissue mass in the left occipital bone extending into the posterior fossa with mass-effect and mild edema of the left cerebellum. This mass extends into the left temporal bone. Additional bone lesions are present in the frontal bones and left parietal bone. Subtle enhancing lesions right frontal lobe measuring 10 mm and 4 mm. Findings compatible with metastatic disease. Electronically Signed   By: Franchot Gallo M.D.   On: 02/15/2021 13:47   NM Bone Scan Whole Body  Result Date: 02/27/2021 CLINICAL DATA:  Metastatic disease evaluation, lung mass, abnormal CT EXAM: NUCLEAR MEDICINE WHOLE BODY BONE SCAN TECHNIQUE: Whole body anterior and posterior images were obtained approximately 3 hours after intravenous injection of radiopharmaceutical. RADIOPHARMACEUTICALS:  19.79 mCi Technetium-72m MDP IV COMPARISON:  None Correlation: CT chest abdomen pelvis 02/15/2021, CT thoracic  and lumbar spine 02/18/2021, CT head 02/15/2021 FINDINGS: Multiple sites of abnormal tracer uptake are identified consistent with widespread osseous metastatic disease. These include calvaria greatest at occipital bone growth more on LEFT, thoracic and lumbar spine, RIGHT ribs, and pelvis. Dextroconvex thoracolumbar scoliosis.  No definite abnormal tracer uptake within the humeri or femora. Expected urinary tract and soft tissue distribution of tracer. IMPRESSION: Scattered osseous metastases as above. Electronically Signed   By: Lavonia Dana M.D.   On: 02/27/2021 18:17   US Renal  Result Date: 02/18/2021 CLINICAL DATA:  Back pain. Right renal pelvis fullness on right upper quadrant ultrasound performed 1 day prior EXAM: RENAL / URINARY TRACT ULTRASOUND COMPLETE COMPARISON:  02/17/2021 abdominal sonogram. FINDINGS: Right Kidney: Renal measurements: 10.3 x 4.2 x 4.2 cm = volume: 95 mL. Normal parenchymal echogenicity and thickness. Mild right hydronephrosis. Possible 5 mm right ureteropelvic junction stone. Nonobstructing 3 mm lower right renal stone. No renal masses. Left Kidney: Renal measurements: 10.0 x 4.8 x 4.2 cm = volume: 107 mL. Normal parenchymal echogenicity and thickness. No left hydronephrosis. No renal masses. Probable nonobstructing 7 mm upper left renal stone. Bladder: Appears normal for degree of bladder distention. Bilateral ureteral jets seen in the bladder. Other: None. IMPRESSION: 1. Mild right hydronephrosis. Possible 5 mm right UPJ stone. Nonobstructing 3 mm lower right renal stone. Suggest unenhanced CT abdomen/pelvis for further evaluation. 2. Probable nonobstructing upper left renal stone. Electronically Signed   By: Ilona Sorrel M.D.   On: 02/18/2021 12:02   CT CHEST ABDOMEN PELVIS W CONTRAST  Result Date: 02/15/2021 CLINICAL DATA:  Altered mental status. 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 EXAM: CT CHEST, ABDOMEN, AND PELVIS WITH CONTRAST TECHNIQUE:  Multidetector CT imaging of the chest, abdomen and pelvis was performed following the standard protocol during bolus administration of intravenous contrast. CONTRAST:  75 mL OMNIPAQUE IOHEXOL 300 MG/ML  SOLN COMPARISON:  None. FINDINGS: CT CHEST FINDINGS Cardiovascular: No significant vascular findings. Normal heart size. No pericardial effusion. Mediastinum/Nodes: The patient has extensive lymphadenopathy. A 2 cm left axillary node is seen on image 11 of series 2; a second left axillary node on image 14 measures 1.4 cm. A 1.1 cm subpectoral node is seen on the left on image 15 of series 2. A 1.4 cm left supraclavicular node is seen on image 9. Right paratracheal node on image 15 measures 1.9 cm. Right precarinal nodal mass on image 25 measures 2.6 cm and there is a right hilar nodal mass measuring 4.2 x 3.1 cm on image 30. The esophagus and thyroid are negative. Lungs/Pleura: Small right pleural effusion. A spiculated nodule in the right middle lobe measures 2.6 cm transverse by 2.7 cm AP on image 77, series 4. Scattered pulmonary nodules include a 0.5 cm nodule in the superior segment of the left lower lobe on image 80, 0.4 cm left upper lobe nodule on image 76 and 2 punctate nodules on image 83. Additional smaller nodules are seen scattered through both lungs. The lungs are emphysematous. No pleural effusion. Musculoskeletal: A 0.9 cm lytic lesion is seen in T5, lytic lesions are seen in T7 and T9, larger in T9. There is also a small lytic lesion in T10. CT ABDOMEN PELVIS FINDINGS Hepatobiliary: Metastatic lesions are seen in the left hepatic lobe measuring 1.7 cm on image 57, series 2 and near the dome of the liver on image 50 of series 2 where a 1.1 cm lesion is identified. A second lesion near the dome of the liver in the right hepatic lobe on image 50 measures 0.7 cm. There is mild intra and extrahepatic biliary ductal dilatation likely related to prior cholecystectomy. Pancreas: Unremarkable. No pancreatic  ductal dilatation or surrounding inflammatory changes. Spleen: Normal in size without focal  abnormality. Adrenals/Urinary Tract: Adrenal glands are unremarkable. Kidneys are normal, without renal calculi, focal lesion, or hydronephrosis. Bladder is unremarkable. Stomach/Bowel: Stomach is within normal limits. Appendix appears normal. No evidence of bowel wall thickening, distention, or inflammatory changes. Moderately large colonic stool burden throughout noted. Vascular/Lymphatic: Aortic atherosclerosis. No aneurysm. No pathologic lymphadenopathy by CT size criteria. Reproductive: Status post hysterectomy. No adnexal masses. Other: None. Musculoskeletal: Mottled appearance of the L2 vertebral body is consistent with metastatic disease. There is a mild pathologic superior endplate compression fracture of L2 with vertebral body height loss centrally of approximately 30%. IMPRESSION: Findings consistent with extensive metastatic carcinoma likely emanating from a 2.7 cm right middle lobe pulmonary nodule. Metastases include extensive lymphadenopathy in the chest,, pulmonary nodule bone lesions and liver lesions. Mild pathologic fracture of L2 with vertebral body height loss centrally of up to approximately 30% is again noted. Aortic Atherosclerosis (ICD10-I70.0) and Emphysema (ICD10-J43.9). Electronically Signed   By: Inge Rise M.D.   On: 02/15/2021 14:25   DG Chest Port 1 View  Result Date: 02/27/2021 CLINICAL DATA:  Worsening shortness of breath since this morning. Recent diagnosis of lung cancer. EXAM: PORTABLE CHEST 1 VIEW COMPARISON:  Radiograph earlier today.  CT earlier today. FINDINGS: Stable heart size and mediastinal contours. Right hilar prominence and thickening of the lower paratracheal stripe related to known adenopathy. Unchanged right mid lung pulmonary nodule. Mild patchy bilateral suprahilar opacities corresponding to ground-glass opacity on CT earlier today. No significant change in the  interim. No pneumothorax or large pleural effusion. Retained excreted IV contrast in the right renal collecting system with right hydronephrosis, partially included in the upper abdomen. IMPRESSION: 1. Stable radiographic appearance of the chest from earlier today. Similar patchy suprahilar opacities corresponding to ground-glass opacity on CT. 2. Right mid lung pulmonary nodule and thoracic adenopathy. 3. Partially included in the upper abdomen is retained excreted contrast in the right renal collecting system with right hydronephrosis. Right hydronephrosis was seen on renal ultrasound 02/18/2021. Electronically Signed   By: Keith Rake M.D.   On: 02/27/2021 15:04   DG Chest Portable 1 View  Result Date: 02/27/2021 CLINICAL DATA:  60 year old female with shortness of breath and cough. Metastatic lung cancer. EXAM: PORTABLE CHEST 1 VIEW COMPARISON:  02/15/2021 and prior studies FINDINGS: New hazy opacities within the central/upper lungs noted. RIGHT middle lobe mass and small scattered pulmonary nodules bilaterally are again identified. No pleural effusion or pneumothorax. No acute bony abnormalities are identified. IMPRESSION: New hazy opacities within the central/upper lungs which may represent edema or infection. Unchanged RIGHT middle lobe mass and bilateral pulmonary nodules compatible with known malignancy/metastases. Electronically Signed   By: Margarette Canada M.D.   On: 02/27/2021 08:49   [place] CORE BIOPSY (LYMPH NODES)  Result Date: 02/21/2021 INDICATION: Multiple enlarged lymph nodes, right middle lobe lung mass, liver lesions and bone lesions. EXAM: ULTRASOUND GUIDED CORE BIOPSY OF LEFT SUPRACLAVICULAR LYMPH NODE MEDICATIONS: None. ANESTHESIA/SEDATION: Versed 2.0 mg IV Moderate Sedation Time:  12 minutes. The patient was continuously monitored during the procedure by the interventional radiology nurse under my direct supervision. PROCEDURE: The procedure, risks, benefits, and alternatives were  explained to the patient. Questions regarding the procedure were encouraged and answered. The patient understands and consents to the procedure. A time-out was performed prior to initiating the procedure. The left neck was prepped with chlorhexidine in a sterile fashion, and a sterile drape was applied covering the operative field. A sterile gown and sterile gloves were used for the  procedure. Local anesthesia was provided with 1% Lidocaine. After localizing an enlarged left supraclavicular lymph node by ultrasound, multiple 18 gauge core biopsy samples were obtained. Core biopsy samples were submitted in formalin as well as on saline soaked Telfa gauze. Additional ultrasound was performed. COMPLICATIONS: None immediate. FINDINGS: An enlarged left supraclavicular lymph node measures approximately 3.0 x 1.8 x 2.9 cm. Solid core biopsy samples were obtained. IMPRESSION: Ultrasound-guided core biopsy performed of an enlarged left supraclavicular lymph node measuring 3 cm in greatest dimensions. Electronically Signed   By: Aletta Edouard M.D.   On: 02/21/2021 15:31   CT Maxillofacial Wo Contrast  Result Date: 02/15/2021 CLINICAL DATA:  Headache with left-sided face/neck/head pain. On antibiotics for reported laryngitis with bilateral ear pain. EXAM: CT HEAD WITHOUT CONTRAST CT MAXILLOFACIAL WITHOUT CONTRAST TECHNIQUE: Multidetector CT imaging of the head and maxillofacial structures were performed using the standard protocol without intravenous contrast. Multiplanar CT image reconstructions of the maxillofacial structures were also generated. COMPARISON:  None. FINDINGS: CT HEAD FINDINGS Brain: The calvarial/skull base mass lesion is described below. Spur this process exerts mass effect on the left cerebellum with suspected narrowing of the fourth ventricle. No evidence of hydrocephalus. No evidence of acute large vascular territory infarct. No acute hemorrhage. Mild to moderate scattered white matter  hypodensities, which are nonspecific but most likely relate to chronic microvascular ischemic disease. No midline shift. Vascular: No hyperdense vessel identified. Calcific atherosclerosis. Skull/skull base: There is aggressive soft tissue mass lesion involving the left occipital calvarium measuring up to approximately 5.5 x 3.1 by 3.3 cm (transverse by AP by craniocaudal) with bony destruction and expansion and extension inferiorly to involve the left temporal bone and skull base. There is mass effect on the inferior left cerebellum and possible involvement of the subjacent dural venous sinuses. Small left mastoid effusion. CT MAXILLOFACIAL FINDINGS Osseous: No fracture or mandibular dislocation. No destructive process in the face. Orbits: Negative. No traumatic or inflammatory finding. Sinuses: Small right maxillary sinus retention cyst. Otherwise, sinuses are clear. Soft tissues: Negative. 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 Resulting mass effect on the left cerebellum and possible involvement of the subjacent dural venous sinuses. 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 Osteomyelitis is a differential consideration. Recommend MRI brain with and without contrast to further evaluate. Findings and recommendations discussed with Ashok Cordia, PA via telephone at 11:20 a.m. Electronically Signed   By: Margaretha Sheffield MD   On: 02/15/2021 11:29   US Abdomen Limited RUQ (LIVER/GB)  Result Date: 02/17/2021 CLINICAL DATA:  Nausea and vomiting for 1 day. EXAM: ULTRASOUND ABDOMEN LIMITED RIGHT UPPER QUADRANT COMPARISON:  CT chest, abdomen and pelvis 02/15/2021. FINDINGS: Gallbladder: Removed. Common bile duct: Diameter: 0.8 cm. Liver: Three hypoechoic liver lesions measuring up to 1.8 cm are identified are identified  and correlate with metastatic deposit seen on prior CT. Mild intrahepatic biliary ductal dilatation seen on CT is also noted. Portal vein is patent on color Doppler imaging with normal direction of blood flow towards the liver. Other: There is fullness of the right renal pelvis which is new since the patient's CT scan yesterday. IMPRESSION: Three hypoechoic liver lesions measuring up to 1.8 cm correlate with metastatic deposit seen on CT. Status post cholecystectomy. Mild intra and extrahepatic biliary ductal dilatation is likely related to cholecystectomy. No obstructing lesion is identified. Fullness  of the right renal pelvis is new since yesterday's examination. This could be incidental. Correlation with dedicated renal ultrasound could be used for further evaluation if indicated. Electronically Signed   By: Inge Rise M.D.   On: 02/17/2021 12:53    Assessment and plan-   #Shortness of breath, acute on chronic CT showed a new groundglass opacities throughout the lungs.  Covid negative. Continue treatment for HCAP.  #Metastatic adenocarcinoma with extensive lymphadenopathy, liver lesion, bone lesion/brain lesion Discussed with her about the diagnosis.  She understands that her disease is not curable. Goals of care is with palliative intent. I recommend patient to establish care with radiation oncology-which I will arrange outpatient.  She may start radiation when she is discharged.  She will also need systemic chemotherapy and I recommend to wait until she fully recovers from current HCAP. Continue patient on dexamethasone 4 mg twice daily at discharge I will slowly taper down. I agree with Keppra for seizure prophylaxis.  Advised patient to avoid driving.   #Neoplasm related pain, history of substance abuse.   Continue current outpatient pain regimen with fentanyl patch and Percocet   Thank you for allowing me to participate in the care of this patient.   Earlie Server, MD, PhD Hematology  Oncology [facility] Pager- [phone] 03/01/2021

## 2021-03-01 NOTE — Plan of Care (Signed)
  Problem: Education: Goal: Knowledge of General Education information will improve Description Including pain rating scale, medication(s)/side effects and non-pharmacologic comfort measures Outcome: Progressing   

## 2021-03-01 NOTE — Consult Note (Addendum)
Jonathon Bellows , MD [address], [place], [place], [place], [id number] [place], [place], [place], [address] Phone: [phone]  Fax: [phone]  Consultation  Referring Provider:     Dr. Frederic Jericho primary Care Physician:  Remi Haggard, FNP Primary Gastroenterologist: None         Reason for Consultation:     GI bleed   Date of Admission:  02/27/2021 Date of Consultation:  03/01/2021         HPI:   Brenda Middleton is a 60 y.o. female with a history of metastatic lung cancer, HTN,COPD, GERD admitted with HCAP, elevated troponin , metabolic encephelopathy . C/o hematochezia to Dr Frederic Jericho yesterday . Per Epic last colonoscopy in 2015 showed multiple polyps.  CT angiogram PE protocol on 02/27/2021 showed extensive groundglass airspace opacities throughout the lungs which are nonspecific.  Diffuse bilateral bronchial wall thickening consistent with infectious or inflammatory bronchitis.CT abdomen on 02/15/2021 showed metastatic carcinoma with bone and liver lesions.  Stomach and bowel showed no gross abnormality but moderate large colonic stool burden noted  CBC Latest Ref Rng & Units 03/01/2021 02/28/2021 02/28/2021  WBC 4.0 - 10.5 K/uL 14.3(H) 12.6(H) 14.2(H)  Hemoglobin 12.0 - 15.0 g/dL 8.4(L) 8.9(L) 9.0(L)  Hematocrit 36.0 - 46.0 % 25.9(L) 27.0(L) 27.3(L)  Platelets 150 - 400 K/uL 200 182 181   When I went to see her she was on oxygen short of breath with able to speak but getting short of breath with the end of the sentence.  She states that she has noticed some blood in the toilet bowl after every bowel movement but a very long time.  She states that she has had some nodules in her vulva  t biopsied by her midwife at the [facility] clinic.  She has never seen a gynecologist she states.  She states she has had surgery on her lumbar in the past.  She has had some bleeding from that area as well.I see a note from 02/11/2021 from her GYN visit which was for cervical cancer screening  which showed atypical squamous cells of undetermined significance.  She also had some biopsies of her rectal lesion that showed high-grade squamous intraepithelial lesion.  With dysplasia.  Her last colonoscopy was in 2015.   Past Medical History:  Diagnosis Date  . Anxiety   . Benign neoplasm of cervix uteri   . Chronic hepatitis C without mention of hepatic coma   . Chronic low back pain 08/26/2014  . Constipation   . Depressive disorder   . Displacement of cervical intervertebral disc   . Hypertensive disorder   . Iron deficiency anemia due to chronic blood loss 09/12/2019  . Palpitations   . Prolapsed cervical intervertebral disc     Past Surgical History:  Procedure Laterality Date  . CONIZATION CERVIX    . DILATION AND CURETTAGE OF UTERUS    . HAND SURGERY  2008,2015   Carpel Tunnel  . TONSILLECTOMY    . VULVECTOMY      Prior to Admission medications   Medication Sig Start Date End Date Taking? Authorizing Provider  albuterol (VENTOLIN HFA) 108 (90 Base) MCG/ACT inhaler Inhale 2 puffs into the lungs every 4 (four) hours as needed for wheezing or shortness of breath. 02/21/21  Yes Danford, Suann Larry, MD  amLODipine (NORVASC) 5 MG tablet Take 1 tablet (5 mg total) by mouth daily. 02/22/21  Yes Danford, Suann Larry, MD  budesonide-formoterol (SYMBICORT) 80-4.5 MCG/ACT inhaler Inhale 2 puffs  into the lungs 2 (two) times daily.   Yes [provider]  busPIRone (BUSPAR) 10 MG tablet Take 10 mg by mouth 2 (two) times daily. Patient is unsure of dose   Yes [provider]  dexamethasone (DECADRON) 4 MG tablet Take 1 tablet (4 mg total) by mouth 2 (two) times daily. 02/21/21  Yes Danford, Suann Larry, MD  gabapentin (NEURONTIN) 300 MG capsule Take 300 mg by mouth daily. 02/04/21  Yes [provider]  hydrochlorothiazide (HYDRODIURIL) 25 MG tablet Take 1 tablet (25 mg total) by mouth daily. 02/22/21  Yes Danford, Suann Larry, MD  hydrOXYzine  (ATARAX/VISTARIL) 25 MG tablet hydroxyzine HCl 25 mg tablet   Yes [provider]  pantoprazole (PROTONIX) 40 MG tablet Take 40 mg by mouth daily. 02/08/21  Yes [provider]  prochlorperazine (COMPAZINE) 10 MG tablet Take 10 mg by mouth every 6 (six) hours as needed for nausea or vomiting.   Yes [provider]  promethazine (PHENERGAN) 12.5 MG tablet Take 1 tablet (12.5 mg total) by mouth every 6 (six) hours as needed for nausea or vomiting. 02/17/21  Yes Blake Divine, MD  QUEtiapine (SEROQUEL) 400 MG tablet Take 400 mg by mouth at bedtime.   Yes [provider]  feeding supplement (ENSURE ENLIVE / ENSURE PLUS) LIQD Take 237 mLs by mouth 3 (three) times daily between meals. 02/21/21   Danford, Suann Larry, MD  ondansetron (ZOFRAN-ODT) 4 MG disintegrating tablet Take 1 tablet (4 mg total) by mouth every 8 (eight) hours as needed. Patient not taking: No sig reported 02/21/21   Edwin Dada, MD  oxyCODONE-acetaminophen (PERCOCET/ROXICET) 5-325 MG tablet Take 1-2 tablets by mouth every 4 (four) hours as needed for severe pain. 02/25/21   Borders, Kirt Boys, NP  amitriptyline (ELAVIL) 25 MG tablet Take 2 tablets (50 mg total) by mouth at bedtime. Patient not taking: No sig reported 08/26/14 01/01/21  Kathrynn Ducking, MD  clonazePAM (KLONOPIN) 0.5 MG tablet Take 0.5 mg by mouth 2 (two) times daily.  01/01/21  [provider]  temazepam (RESTORIL) 30 MG capsule Take 30 mg by mouth at bedtime.  01/01/21  [provider]    Family History  Problem Relation Age of Onset  . Breast cancer Mother 29  . Lung cancer Father   . Cancer Brother   . Cancer Other   . Stroke Other      Social History   Tobacco Use  . Smoking status: Former Smoker    Packs/day: 0.70    Years: 35.00    Pack years: 24.50    Types: Cigarettes  . Smokeless tobacco: Never Used  Vaping Use  . Vaping Use: Never used  Substance Use Topics  . Alcohol use: Yes     Comment: occasional wine  . Drug use: No    Allergies as of 02/27/2021 - Review Complete 02/27/2021  Allergen Reaction Noted  . Penicillin g Hives 03/11/2013  . Penicillin v Itching 08/26/2014  . Penicillins  08/06/2014    Review of Systems:    All systems reviewed and negative except where noted in HPI.   Physical Exam:  Vital signs in last 24 hours: Temp:  [98.2 F (36.8 C)-99.6 F (37.6 C)] 99.6 F (37.6 C) (03/29 0853) Pulse Rate:  [92-105] 102 (03/29 0853) Resp:  [10-24] 20 (03/29 0853) BP: (115-149)/(64-85) 134/80 (03/29 0853) SpO2:  [90 %-100 %] 94 % (03/29 0853) Last BM Date: 02/28/21 General:   Pleasant, cooperative in NAD Head:  Normocephalic and atraumatic. Eyes:   No icterus.   Conjunctiva pink. PERRLA. Ears:  Normal auditory acuity. Abdomen:  Soft, nondistended, nontender. Normal bowel sounds. No appreciable masses or hepatomegaly.  No rebound or guarding.  Neurologic:  Alert and oriented x3;  grossly normal neurologically. Skin:  Intact without significant lesions or rashes. Cervical Nodes:  No significant cervical adenopathy. Psych:  Alert and cooperative. Normal affect.  LAB RESULTS: Recent Labs    02/28/21 1355 02/28/21 2051 03/01/21 0520  WBC 14.2* 12.6* 14.3*  HGB 9.0* 8.9* 8.4*  HCT 27.3* 27.0* 25.9*  PLT 181 182 200   BMET Recent Labs    02/27/21 0819 02/28/21 0322 03/01/21 0520  NA 137 140 139  K 3.8 3.7 4.1  CL 102 104 109  CO2 25 28 24   GLUCOSE 164* 108* 129*  BUN 21* 19 23*  CREATININE 0.68 0.59 0.70  CALCIUM 8.3* 8.4* 8.5*   LFT Recent Labs    02/27/21 0819  PROT 6.8  ALBUMIN 3.3*  AST 29  ALT 32  ALKPHOS 312*  BILITOT 0.5   PT/INR No results for input(s): LABPROT, INR in the last 72 hours.  STUDIES: DG Chest Port 1 View  Result Date: 02/27/2021 CLINICAL DATA:  Worsening shortness of breath since this morning. Recent diagnosis of lung cancer. EXAM: PORTABLE CHEST 1 VIEW COMPARISON:  Radiograph earlier today.  CT  earlier today. FINDINGS: Stable heart size and mediastinal contours. Right hilar prominence and thickening of the lower paratracheal stripe related to known adenopathy. Unchanged right mid lung pulmonary nodule. Mild patchy bilateral suprahilar opacities corresponding to ground-glass opacity on CT earlier today. No significant change in the interim. No pneumothorax or large pleural effusion. Retained excreted IV contrast in the right renal collecting system with right hydronephrosis, partially included in the upper abdomen. IMPRESSION: 1. Stable radiographic appearance of the chest from earlier today. Similar patchy suprahilar opacities corresponding to ground-glass opacity on CT. 2. Right mid lung pulmonary nodule and thoracic adenopathy. 3. Partially included in the upper abdomen is retained excreted contrast in the right renal collecting system with right hydronephrosis. Right hydronephrosis was seen on renal ultrasound 02/18/2021. Electronically Signed   By: Keith Rake M.D.   On: 02/27/2021 15:04      Impression / Plan:   Brenda Middleton is a 60 y.o. y/o female with a history of metastatic lung cancer admitted with HCAP pneumonia and acute metabolic encephalopathy I have been called for rectal bleeding.  CT abdomen on 02/15/2021 shows large colonic stool burden suggestive of constipation but no other gross abnormalities.  Last colonoscopy with back in 2015 which showed multiple polyps.  She has recently had lesions on her rectum biopsied which have showed high-grade squamous intraepithelial lesion and she has had a rectal biopsy which is showed atypical squamous cells of undetermined significance.  At this point of time her respiratory status does not permit any endoscopy procedure which would require sedation ,if she has significant bleeding I would suggest a tagged RBC scan which will help localize the source of bleeding and can then determine whether she could obtain embolization.   Patient  insisted that I speak to her midwife she provided me a number, I have called the practice and left a message for Lara Mulch to give me a call back.  I will update the patient once I have spoken to. Thank you for involving me in the care of this patient.      LOS: 2 days  Jonathon Bellows, MD  03/01/2021, 11:11 AM

## 2021-03-01 NOTE — Progress Notes (Signed)
PROGRESS NOTE    Brenda Middleton  HQI:[id number] DOB: [birth year] DOA: 02/27/2021 PCP: Remi Haggard, FNP   Brief Narrative: 60 year old with past medical history significant for recent diagnosis of metastatic  lung cancer, hypertension, COPD, GERD, depression with anxiety, anemia who presents complaining of worsening shortness of breath. 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  Patient has been having worsening cough for about 1 week worse since the morning of admission.  Patient reported chest tightness.  No nausea vomiting. Evaluation in the ED patient was found to have white count of 23, lactic acid 1.6, chest x-ray with new hazy opacity bilaterally.  CT angio negative for PE but showed extensive groundglass opacity.  Assessment & Plan:   Principal Problem:   HCAP (healthcare-associated pneumonia) Active Problems:   COPD exacerbation (HCC)   GERD (gastroesophageal reflux disease)   Pathologic compression fracture of lumbar vertebra (HCC)   HTN (hypertension)   Depression with anxiety   Normocytic anemia   Sepsis ([place])   Protein-calorie malnutrition, moderate ([place])   Primary malignant neoplasm of lung metastatic to other site [facility])   Elevated troponin   Acute metabolic encephalopathy   Brain metastases (HCC)   Shortness of breath  1-Acute Hypoxic Respiratory Failure secondary to Healthcare Associated Pneumonia and Lung cancer -ABG obtained PO2 59.  CT angio: Negative for  PE, extensive groundglass airspace opacity throughout the lung.  Bronchitis, redemonstrated finding of advanced metastatic lung cancer. -Oxygen delivery 4 L -Continue with scheduled guaifenesin. -Continue with IV Solu-Medrol, need to be taper back to chronic dexamethasone dose for brain mets. -Continue with a scheduled nebulizer treatments -Continue with treatment for pneumonia with IV antibiotics, and Comycin and  cefepime day 3  2-Sepsis Secondary to Pneumonia: -Patient presents with leukocytosis, tachycardia, tachypnea. -CT angio with extensive groundglass airspace opacity -Continue with IV vancomycin and cefepime day 3. -Blood cultures; no growth to date. -Follow Legionella and strep antigen, need to be collected.  3-Acute COPD exacerbation:  -Continue with IV steroids. -Continue with nebulizers  4-Lung cancer with mets to brain and skull: -Appreciate Dr. Tasia Catchings evaluation.  -On Decadron 4 mg twice daily, changed to IV Solu-Medrol to cover for COPD exacerbation -Need to resume oral Decadron -Discussed with Dr Tasia Catchings, okay to start [place] for seizure prophylaxis.  Continue with Keppra at discharge.  5-GERD: Continue with Protonix 6-Pathologic  compression fracture of lumbar vertebra: Continue with fentanyl patch and as needed Percocet 7-Hypertension: Hold blood pressure medication 8-Hematochezia: Patient reported hematochezia for a long period of time: Started  IV Protonix.  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 -Cycle hemoglobin.  Discontinue Lovenox that was ordered for DVT prophylaxis.  Discontinue aspirin. -GI consulted. -Hemoglobin; 9---8.--9.6 9-Tachycardia; hold low-dose metoprolol to avoid hypotension in the setting of GI bleed. 13-KGMWN metabolic encephalopathy: Secondary to infectious process, hypoxemia -Less agitated today agreed to stay in the hospital. -Continue Haldol as needed    Estimated body mass index is 18.43 kg/m as calculated from the following:   Height as of this encounter: 5\' 6"  (1.676 m).   Weight as of this encounter: 51.8 kg.   DVT prophylaxis: SCD Code Status: Full code Family Communication: care discussed with sister  Disposition Plan:  Status is: Inpatient  Remains inpatient appropriate because:IV treatments appropriate due to intensity of illness or inability to take PO   Dispo: The patient is from: Home  Anticipated d/c is to: Home              Patient currently is not medically stable to d/c.   Difficult to place patient No        Consultants:   Oncology  GI  Procedures:   None  Antimicrobials:  Cefepime Vancomycin  Subjective: She was agitated yesterday trying to leave the hospital.  She is more calm today.  She report that she is breathing a little bit better.  She wanted to take a shower. He report small amount of blood per rectum every time that she goes to the bathroom  Objective: Vitals:   02/28/21 2032 03/01/21 0125 03/01/21 0523 03/01/21 0725  BP:  115/68 119/64   Pulse:  93 92   Resp:  13 18   Temp:  98.3 F (36.8 C) 98.4 F (36.9 C)   TempSrc:  Oral    SpO2: 94% 93% 90% 96%  Weight:      Height:        Intake/Output Summary (Last 24 hours) at 03/01/2021 0734 Last data filed at 02/28/2021 2100 Gross per 24 hour  Intake 840 ml  Output 1200 ml  Net -360 ml   Filed Weights   02/27/21 0803  Weight: 51.8 kg    Examination:  General exam: Chronic ill-appearing, alert Respiratory system: T tachypnea, bilateral rhonchorous/less expiratory wheezing Cardiovascular system: S 1, S 2 RRR Gastrointestinal system: BS soft, nt Central nervous system: Alert, following command Extremities: No edema   Data Reviewed: I have personally reviewed following labs and imaging studies  CBC: Recent Labs  Lab 02/27/21 0819 02/28/21 0322 02/28/21 1355 02/28/21 2051 03/01/21 0520  WBC 23.6* 16.2* 14.2* 12.6* 14.3*  NEUTROABS 20.5*  --   --   --   --   HGB 10.7* 9.2* 9.0* 8.9* 8.4*  HCT 32.4* 28.7* 27.3* 27.0* 25.9*  MCV 86.6 88.0 88.1 87.9 88.1  PLT 241 200 181 182 366   Basic Metabolic Panel: Recent Labs  Lab 02/27/21 0819 02/28/21 0322  NA 137 140  K 3.8 3.7  CL 102 104  CO2 25 28  GLUCOSE 164* 108*  BUN 21* 19  CREATININE 0.68 0.59  CALCIUM 8.3* 8.4*  MG 2.0  --    GFR: Estimated Creatinine Clearance: 61.2 mL/min (by C-G formula based on  SCr of 0.59 mg/dL). Liver Function Tests: Recent Labs  Lab 02/27/21 0819  AST 29  ALT 32  ALKPHOS 312*  BILITOT 0.5  PROT 6.8  ALBUMIN 3.3*   No results for input(s): LIPASE, AMYLASE in the last 168 hours. Recent Labs  Lab 02/27/21 1243  AMMONIA 17   Coagulation Profile: No results for input(s): INR, PROTIME in the last 168 hours. Cardiac Enzymes: No results for input(s): CKTOTAL, CKMB, CKMBINDEX, TROPONINI in the last 168 hours. BNP (last 3 results) No results for input(s): PROBNP in the last 8760 hours. HbA1C: Recent Labs    02/28/21 0322  HGBA1C 5.2   CBG: Recent Labs  Lab 02/28/21 0813  GLUCAP 100*   Lipid Profile: Recent Labs    02/28/21 0322  CHOL 142  HDL 70  LDLCALC 55  TRIG 85  CHOLHDL 2.0   Thyroid Function Tests: No results for input(s): TSH, T4TOTAL, FREET4, T3FREE, THYROIDAB in the last 72 hours. Anemia Panel: No results for input(s): VITAMINB12, FOLATE, FERRITIN, TIBC, IRON, RETICCTPCT in the last 72 hours. Sepsis Labs: Recent Labs  Lab 02/27/21 0907 02/27/21 1243  PROCALCITON  --  0.10  LATICACIDVEN 1.6  --     Recent Results (from the past 240 hour(s))  MRSA PCR Screening     Status: None   Collection Time: 02/19/21  4:22 PM   Specimen: Nasopharyngeal  Result Value Ref Range Status   MRSA by PCR NEGATIVE NEGATIVE Final    Comment:        The GeneXpert MRSA Assay (FDA approved for NASAL specimens only), is one component of a comprehensive MRSA colonization surveillance program. It is not intended to diagnose MRSA infection nor to guide or monitor treatment for MRSA infections. Performed at [facility], [address] [place], [place] [address]   Culture, blood (single)     Status: None (Preliminary result)   Collection Time: 02/27/21  9:07 AM   Specimen: BLOOD  Result Value Ref Range Status   Specimen Description BLOOD RIGHT FOREARM  Final   Special Requests   Final    BOTTLES DRAWN AEROBIC AND ANAEROBIC  Blood Culture adequate volume   Culture   Final    NO GROWTH < 24 HOURS Performed at [facility], [address], [place], [place] [address]    Report Status PENDING  Incomplete  Resp Panel by RT-PCR (Flu A&B, Covid) Nasopharyngeal Swab     Status: None   Collection Time: 02/27/21  9:07 AM   Specimen: Nasopharyngeal Swab; Nasopharyngeal(NP) swabs in vial transport medium  Result Value Ref Range Status   SARS Coronavirus 2 by RT PCR NEGATIVE NEGATIVE Final    Comment: (NOTE) SARS-CoV-2 target nucleic acids are NOT DETECTED.  The SARS-CoV-2 RNA is generally detectable in upper respiratory specimens during the acute phase of infection. The lowest concentration of SARS-CoV-2 viral copies this assay can detect is 138 copies/mL. A negative result does not preclude SARS-Cov-2 infection and should not be used as the sole basis for treatment or other patient management decisions. A negative result may occur with  improper specimen collection/handling, submission of specimen other than nasopharyngeal swab, presence of viral mutation(s) within the areas targeted by this assay, and inadequate number of viral copies(<138 copies/mL). A negative result must be combined with clinical observations, patient history, and epidemiological information. The expected result is Negative.  Fact Sheet for Patients:  EntrepreneurPulse.com.au  Fact Sheet for Healthcare Providers:  IncredibleEmployment.be  This test is no t yet approved or cleared by the [place] FDA and  has been authorized for detection and/or diagnosis of SARS-CoV-2 by FDA under an Emergency Use Authorization (EUA). This EUA will remain  in effect (meaning this test can be used) for the duration of the COVID-19 declaration under Section 564(b)(1) of the Act, 21 U.S.C.section 360bbb-3(b)(1), unless the authorization is terminated  or revoked sooner.       Influenza A by PCR NEGATIVE  NEGATIVE Final   Influenza B by PCR NEGATIVE NEGATIVE Final    Comment: (NOTE) The Xpert Xpress SARS-CoV-2/FLU/RSV plus assay is intended as an aid in the diagnosis of influenza from Nasopharyngeal swab specimens and should not be used as a sole basis for treatment. Nasal washings and aspirates are unacceptable for Xpert Xpress SARS-CoV-2/FLU/RSV testing.  Fact Sheet for Patients: EntrepreneurPulse.com.au  Fact Sheet for Healthcare Providers: IncredibleEmployment.be  This test is not yet approved or cleared by the [place] FDA and has been authorized for detection and/or diagnosis of SARS-CoV-2 by FDA under an Emergency Use Authorization (EUA). This EUA will remain in effect (meaning this test can be used) for the duration of the COVID-19 declaration under  Section 564(b)(1) of the Act, 21 U.S.C. section 360bbb-3(b)(1), unless the authorization is terminated or revoked.  Performed at [facility], [address] [place], [place] [address]          Radiology Studies: CT Angio Chest PE W and/or Wo Contrast  Result Date: 02/27/2021 CLINICAL DATA:  PE suspected, widely metastatic lung cancer EXAM: CT ANGIOGRAPHY CHEST WITH CONTRAST TECHNIQUE: Multidetector CT imaging of the chest was performed using the standard protocol during bolus administration of intravenous contrast. Multiplanar CT image reconstructions and MIPs were obtained to evaluate the vascular anatomy. CONTRAST:  37mL OMNIPAQUE IOHEXOL 350 MG/ML SOLN COMPARISON:  02/15/2021 FINDINGS: Cardiovascular: Satisfactory opacification of the pulmonary arteries to the segmental level. No evidence of pulmonary embolism. Cardiomegaly. Left and right coronary artery calcifications and stents. No pericardial effusion. Mediastinum/Nodes: Redemonstrated, very extensive bulky bilateral hilar, mediastinal, supraclavicular, and left axillary lymphadenopathy. Thyroid gland, trachea, and  esophagus demonstrate no significant findings. Lungs/Pleura: Diffuse bilateral bronchial wall thickening. Spiculated right upper lobe mass as seen on prior examination (series 7, image 42). Numerous redemonstrated small pulmonary nodules throughout the lungs. There is new, extensive ground-glass airspace opacity throughout the lungs, somewhat geographic in the upper lobes (series 7, image 33), although somewhat more nodular appearing in the lower lungs (series 7, image 58). No pleural effusion or pneumothorax. Upper Abdomen: No acute abnormality. Multiple low-attenuation lesions of liver, better assessed by prior dedicated of the abdomen. Musculoskeletal: No chest wall abnormality. Multiple osseous metastatic lesions, most significantly a lytic lesion of the T9 vertebral body (series 9, image 58). Review of the MIP images confirms the above findings. IMPRESSION: 1. Negative examination for pulmonary embolism. 2. There is new, extensive ground-glass airspace opacity throughout the lungs, somewhat geographic in the upper lobes, although somewhat more nodular appearing in the lower lungs. Findings are nonspecific and infectious or inflammatory, differential considerations primarily include drug toxicity and atypical/viral infection. 3. Diffuse bilateral bronchial wall thickening, consistent with nonspecific infectious or inflammatory bronchitis. 4. Redemonstrated findings of advanced metastatic lung malignancy, better assessed by recent prior staging examination. 5. Coronary artery disease. Electronically Signed   By: Eddie Candle M.D.   On: 02/27/2021 10:49   DG Chest Port 1 View  Result Date: 02/27/2021 CLINICAL DATA:  Worsening shortness of breath since this morning. Recent diagnosis of lung cancer. EXAM: PORTABLE CHEST 1 VIEW COMPARISON:  Radiograph earlier today.  CT earlier today. FINDINGS: Stable heart size and mediastinal contours. Right hilar prominence and thickening of the lower paratracheal stripe  related to known adenopathy. Unchanged right mid lung pulmonary nodule. Mild patchy bilateral suprahilar opacities corresponding to ground-glass opacity on CT earlier today. No significant change in the interim. No pneumothorax or large pleural effusion. Retained excreted IV contrast in the right renal collecting system with right hydronephrosis, partially included in the upper abdomen. IMPRESSION: 1. Stable radiographic appearance of the chest from earlier today. Similar patchy suprahilar opacities corresponding to ground-glass opacity on CT. 2. Right mid lung pulmonary nodule and thoracic adenopathy. 3. Partially included in the upper abdomen is retained excreted contrast in the right renal collecting system with right hydronephrosis. Right hydronephrosis was seen on renal ultrasound 02/18/2021. Electronically Signed   By: Keith Rake M.D.   On: 02/27/2021 15:04   DG Chest Portable 1 View  Result Date: 02/27/2021 CLINICAL DATA:  60 year old female with shortness of breath and cough. Metastatic lung cancer. EXAM: PORTABLE CHEST 1 VIEW COMPARISON:  02/15/2021 and prior studies FINDINGS: New hazy opacities within the central/upper  lungs noted. RIGHT middle lobe mass and small scattered pulmonary nodules bilaterally are again identified. No pleural effusion or pneumothorax. No acute bony abnormalities are identified. IMPRESSION: New hazy opacities within the central/upper lungs which may represent edema or infection. Unchanged RIGHT middle lobe mass and bilateral pulmonary nodules compatible with known malignancy/metastases. Electronically Signed   By: Margarette Canada M.D.   On: 02/27/2021 08:49        Scheduled Meds: . busPIRone  10 mg Oral BID  . feeding supplement  1 Container Oral TID BM  . fentaNYL  1 patch Transdermal Q72H  . gabapentin  300 mg Oral Daily  . guaiFENesin  1,200 mg Oral BID  . ipratropium-albuterol  3 mL Nebulization TID  . levETIRAcetam  500 mg Oral BID  . methylPREDNISolone  (SOLU-MEDROL) injection  60 mg Intravenous Q8H  . mometasone-formoterol  2 puff Inhalation BID  . multivitamin with minerals  1 tablet Oral Daily  . nicotine  21 mg Transdermal Daily  . pantoprazole (PROTONIX) IV  40 mg Intravenous Q12H  . QUEtiapine  400 mg Oral QHS   Continuous Infusions: . ceFEPime (MAXIPIME) IV 2 g (03/01/21 0539)  . vancomycin Stopped (03/01/21 0030)     LOS: 2 days    Time spent: 35 minutes    Zorana Brockwell A Desean Heemstra, MD [facility]   If 7PM-7AM, please contact night-coverage [email or web address]  03/01/2021, 7:34 AM

## 2021-03-02 DIAGNOSIS — K921 Melena: Secondary | ICD-10-CM

## 2021-03-02 LAB — CBC
HCT: 26.6 % — ABNORMAL LOW (ref 36.0–46.0)
HCT: 28.8 % — ABNORMAL LOW (ref 36.0–46.0)
Hemoglobin: 8.7 g/dL — ABNORMAL LOW (ref 12.0–15.0)
Hemoglobin: 9.2 g/dL — ABNORMAL LOW (ref 12.0–15.0)
MCH: 28.5 pg (ref 26.0–34.0)
MCH: 28.8 pg (ref 26.0–34.0)
MCHC: 32.7 g/dL (ref 30.0–36.0)
MCHC: 31.9 g/dL (ref 30.0–36.0)
MCV: 87.2 fL (ref 80.0–100.0)
MCV: 90.3 fL (ref 80.0–100.0)
Platelets: 231 10*3/uL (ref 150–400)
Platelets: 244 10*3/uL (ref 150–400)
RBC: 3.05 MIL/uL — ABNORMAL LOW (ref 3.87–5.11)
RBC: 3.19 MIL/uL — ABNORMAL LOW (ref 3.87–5.11)
RDW: 15.4 % (ref 11.5–15.5)
RDW: 15.7 % — ABNORMAL HIGH (ref 11.5–15.5)
WBC: 19.8 10*3/uL — ABNORMAL HIGH (ref 4.0–10.5)
WBC: 24 10*3/uL — ABNORMAL HIGH (ref 4.0–10.5)
nRBC: 0 % (ref 0.0–0.2)
nRBC: 0 % (ref 0.0–0.2)

## 2021-03-02 LAB — BASIC METABOLIC PANEL
Anion gap: 7 (ref 5–15)
BUN: 24 mg/dL — ABNORMAL HIGH (ref 6–20)
CO2: 21 mmol/L — ABNORMAL LOW (ref 22–32)
Calcium: 8.4 mg/dL — ABNORMAL LOW (ref 8.9–10.3)
Chloride: 110 mmol/L (ref 98–111)
Creatinine, Ser: 0.62 mg/dL (ref 0.44–1.00)
GFR, Estimated: >60 mL/min (ref >60)
Glucose, Bld: 124 mg/dL — ABNORMAL HIGH (ref 70–99)
Potassium: 4 mmol/L (ref 3.5–5.1)
Sodium: 138 mmol/L (ref 135–145)

## 2021-03-02 LAB — MRSA PCR SCREENING: MRSA by PCR: NEGATIVE

## 2021-03-02 MED ORDER — CEFUROXIME AXETIL 250 MG PO TABS: 250.0000 mg | ORAL_TABLET | Freq: Two times a day (BID) | ORAL | 0 refills | Status: DC

## 2021-03-02 MED ORDER — DOXYCYCLINE HYCLATE 100 MG PO TABS: 100.0000 mg | ORAL_TABLET | Freq: Two times a day (BID) | ORAL | 0 refills | Status: DC

## 2021-03-02 MED ORDER — DEXAMETHASONE 4 MG PO TABS: 4.0000 mg | ORAL_TABLET | Freq: Two times a day (BID) | ORAL | 1 refills | Status: DC

## 2021-03-02 MED ORDER — METOPROLOL TARTRATE 25 MG PO TABS: 12.5000 mg | ORAL_TABLET | Freq: Two times a day (BID) | ORAL | 3 refills | Status: DC

## 2021-03-02 MED ORDER — LEVETIRACETAM 500 MG PO TABS: 500.0000 mg | ORAL_TABLET | Freq: Two times a day (BID) | ORAL | 2 refills | Status: DC

## 2021-03-02 MED ORDER — GUAIFENESIN ER 600 MG PO TB12: 1200.0000 mg | ORAL_TABLET | Freq: Two times a day (BID) | ORAL | 0 refills | Status: DC

## 2021-03-02 MED ORDER — FENTANYL 50 MCG/HR TD PT72: 1.0000 | MEDICATED_PATCH | TRANSDERMAL | 0 refills | Status: DC

## 2021-03-02 NOTE — Discharge Instructions (Signed)
Chronic Back Pain When back pain lasts longer than 3 months, it is called chronic back pain. Pain may get worse at certain times (flare-ups). There are things you can do at home to manage your pain. Follow these instructions at home: Pay attention to any changes in your symptoms. Take these actions to help with your pain: Managing pain and stiffness  If told, put ice on the painful area. Your doctor may tell you to use ice for 24-48 hours after the flare-up starts. To do this: ? Put ice in a plastic bag. ? Place a towel between your skin and the bag. ? Leave the ice on for 20 minutes, 2-3 times a day.  If told, put heat on the painful area. Do this as often as told by your doctor. Use the heat source that your doctor recommends, such as a moist heat pack or a heating pad. ? Place a towel between your skin and the heat source. ? Leave the heat on for 20-30 minutes. ? Take off the heat if your skin turns bright red. This is especially important if you are unable to feel pain, heat, or cold. You may have a greater risk of getting burned.  Soak in a warm bath. This can help relieve pain.      Activity  Avoid bending and other activities that make pain worse.  When standing: ? Keep your upper back and neck straight. ? Keep your shoulders pulled back. ? Avoid slouching.  When sitting: ? Keep your back straight. ? Relax your shoulders. Do not round your shoulders or pull them backward.  Do not sit or stand in one place for long periods of time.  Take short rest breaks during the day. Lying down or standing is usually better than sitting. Resting can help relieve pain.  When sitting or lying down for a long time, do some mild activity or stretching. This will help to prevent stiffness and pain.  Get regular exercise. Ask your doctor what activities are safe for you.  Do not lift anything that is heavier than 10 lb (4.5 kg) or the limit that you are told, until your doctor says that it  is safe.  To prevent injury when you lift things: ? Bend your knees. ? Keep the weight close to your body. ? Avoid twisting.  Sleep on a firm mattress. Try lying on your side with your knees slightly bent. If you lie on your back, put a pillow under your knees.   Medicines  Treatment may include medicines for pain and swelling taken by mouth or put on the skin, prescription pain medicine, or muscle relaxants.  Take over-the-counter and prescription medicines only as told by your doctor.  Ask your doctor if the medicine prescribed to you: ? Requires you to avoid driving or using machinery. ? Can cause trouble pooping (constipation). You may need to take these actions to prevent or treat trouble pooping:  Drink enough fluid to keep your pee (urine) pale yellow.  Take over-the-counter or prescription medicines.  Eat foods that are high in fiber. These include beans, whole grains, and fresh fruits and vegetables.  Limit foods that are high in fat and sugars. These include fried or sweet foods. General instructions  Do not use any products that contain nicotine or tobacco, such as cigarettes, e-cigarettes, and chewing tobacco. If you need help quitting, ask your doctor.  Keep all follow-up visits as told by your doctor. This is important. Contact a doctor if:    Your pain does not get better with rest or medicine.  Your pain gets worse, or you have new pain.  You have a high fever.  You lose weight very quickly.  You have trouble doing your normal activities. Get help right away if:  One or both of your legs or feet feel weak.  One or both of your legs or feet lose feeling (have numbness).  You have trouble controlling when you poop (have a bowel movement) or pee (urinate).  You have bad back pain and: ? You feel like you may vomit (nauseous), or you vomit. ? You have pain in your belly (abdomen). ? You have shortness of breath. ? You faint. Summary  When back pain  lasts longer than 3 months, it is called chronic back pain.  Pain may get worse at certain times (flare-ups).  Use ice and heat as told by your doctor. Your doctor may tell you to use ice after flare-ups. This information is not intended to replace advice given to you by your health care provider. Make sure you discuss any questions you have with your health care provider. Document Revised: 12/31/2019 Document Reviewed: 12/31/2019 Elsevier Patient Education  2021 [employer]  

## 2021-03-02 NOTE — Progress Notes (Signed)
Brenda Middleton , MD [address] [place], [place], [place], [address] [address], [place], [place], [place], [address] Phone: [phone]  Fax: [phone]   Brenda Middleton is being followed for rectal bleeding day 1 of follow up   Subjective: Feels weak , still has some spotting when she has a bowel movement and still feels short of breath.    Objective: Vital signs in last 24 hours: Vitals:   03/01/21 1955 03/01/21 2008 03/02/21 0525 03/02/21 0751  BP: (!) 164/82  (!) 149/84 (!) 143/84  Pulse: (!) 106  (!) 102 98  Resp: 18  16 18   Temp: 98.7 F (37.1 C)  97.6 F (36.4 C) 98.8 F (37.1 C)  TempSrc:   Oral Oral  SpO2: 98% 97% 99% 100%  Weight:      Height:       Weight change:   Intake/Output Summary (Last 24 hours) at 03/02/2021 0855 Last data filed at 03/02/2021 7412 Gross per 24 hour  Intake 671.16 ml  Output --  Net 671.16 ml     Exam: Appears short of breath, able to speak sentences with pauses.    Lab Results: @LABTEST2 @ Micro Results: Recent Results (from the past 240 hour(s))  Culture, blood (single)     Status: None (Preliminary result)   Collection Time: 02/27/21  9:07 AM   Specimen: BLOOD  Result Value Ref Range Status   Specimen Description BLOOD RIGHT FOREARM  Final   Special Requests   Final    BOTTLES DRAWN AEROBIC AND ANAEROBIC Blood Culture adequate volume   Culture   Final    NO GROWTH 3 DAYS Performed at [facility], [address] [place],  [address]    Report Status PENDING  Incomplete  Resp Panel by RT-PCR (Flu A&B, Covid) Nasopharyngeal Swab     Status: None   Collection Time: 02/27/21  9:07 AM   Specimen: Nasopharyngeal Swab; Nasopharyngeal(NP) swabs in vial transport medium  Result Value Ref Range Status   SARS Coronavirus 2 by RT PCR NEGATIVE NEGATIVE Final    Comment: (NOTE) SARS-CoV-2 target nucleic acids are NOT DETECTED.  The SARS-CoV-2 RNA is generally detectable in upper  respiratory specimens during the acute phase of infection. The lowest concentration of SARS-CoV-2 viral copies this assay can detect is 138 copies/mL. A negative result does not preclude SARS-Cov-2 infection and should not be used as the sole basis for treatment or other patient management decisions. A negative result may occur with  improper specimen collection/handling, submission of specimen other than nasopharyngeal swab, presence of viral mutation(s) within the areas targeted by this assay, and inadequate number of viral copies(<138 copies/mL). A negative result must be combined with clinical observations, patient history, and epidemiological information. The expected result is Negative.  Fact Sheet for Patients:  EntrepreneurPulse.com.au  Fact Sheet for Healthcare Providers:  IncredibleEmployment.be  This test is no t yet approved or cleared by the [place] FDA and  has been authorized for detection and/or diagnosis of SARS-CoV-2 by FDA under an Emergency Use Authorization (EUA). This EUA will remain  in effect (meaning this test can be used) for the duration of the COVID-19 declaration under Section 564(b)(1) of the Act, 21 U.S.C.section 360bbb-3(b)(1), unless the authorization is terminated  or revoked sooner.       Influenza A by PCR NEGATIVE NEGATIVE Final   Influenza B by PCR NEGATIVE NEGATIVE Final    Comment: (NOTE) The Xpert Xpress SARS-CoV-2/FLU/RSV plus assay is intended  as an aid in the diagnosis of influenza from Nasopharyngeal swab specimens and should not be used as a sole basis for treatment. Nasal washings and aspirates are unacceptable for Xpert Xpress SARS-CoV-2/FLU/RSV testing.  Fact Sheet for Patients: EntrepreneurPulse.com.au  Fact Sheet for Healthcare Providers: IncredibleEmployment.be  This test is not yet approved or cleared by the [place] FDA and has been  authorized for detection and/or diagnosis of SARS-CoV-2 by FDA under an Emergency Use Authorization (EUA). This EUA will remain in effect (meaning this test can be used) for the duration of the COVID-19 declaration under Section 564(b)(1) of the Act, 21 U.S.C. section 360bbb-3(b)(1), unless the authorization is terminated or revoked.  Performed at [facility], [address] [place], [place] [address]    Studies/Results: No results found. Medications: I have reviewed the patient's current medications. Scheduled Meds: . busPIRone  10 mg Oral BID  . feeding supplement  1 Container Oral TID BM  . fentaNYL  1 patch Transdermal Q72H  . gabapentin  300 mg Oral Daily  . guaiFENesin  1,200 mg Oral BID  . ipratropium-albuterol  3 mL Nebulization TID  . levETIRAcetam  500 mg Oral BID  . methylPREDNISolone (SOLU-MEDROL) injection  60 mg Intravenous Q8H  . mometasone-formoterol  2 puff Inhalation BID  . multivitamin with minerals  1 tablet Oral Daily  . nicotine  21 mg Transdermal Daily  . pantoprazole (PROTONIX) IV  40 mg Intravenous Q12H  . polyethylene glycol  17 g Oral Daily  . QUEtiapine  400 mg Oral QHS   Continuous Infusions: . ceFEPime (MAXIPIME) IV 2 g (03/02/21 0558)  . vancomycin Stopped (03/02/21 0000)   PRN Meds:.acetaminophen, albuterol, benzonatate, chlorpheniramine-HYDROcodone, hydrALAZINE, hydrOXYzine, LORazepam, ondansetron (ZOFRAN) IV, oxyCODONE-acetaminophen   Assessment: Principal Problem:   HCAP (healthcare-associated pneumonia) Active Problems:   COPD exacerbation (HCC)   GERD (gastroesophageal reflux disease)   Pathologic compression fracture of lumbar vertebra (HCC)   HTN (hypertension)   Depression with anxiety   Normocytic anemia   Sepsis (HCC)   Protein-calorie malnutrition, moderate (HCC)   Primary malignant neoplasm of lung metastatic to other site [name]   Elevated troponin   Acute metabolic encephalopathy   Brain metastases (HCC)    Shortness of breath   Brenda Middleton is a 60 y.o. y/o female with a history of metastatic lung cancer admitted with HCAP pneumonia and acute metabolic encephalopathy I have been called for rectal bleeding.  CT abdomen on 02/15/2021 shows large colonic stool burden suggestive of constipation but no other gross abnormalities.  Last colonoscopy with back in 2015 which showed multiple polyps.  She has recently had lesions on her rectum biopsied which have showed high-grade squamous intraepithelial lesion and she has had a rectal biopsy which is showed atypical squamous cells of undetermined significance.  At this point of time her respiratory status does not permit any endoscopy procedure which would require sedation ,if she has significant bleeding I would suggest a tagged RBC scan which will help localize the source of bleeding and can then determine whether she could obtain embolization.    I spoke to her midwife Lara Mulch this morning.  We confirmed that an anal Pap as well as a cervical Pap was performed.  Abnormal results was not discussed with the patient that she had left multiple messages to her which I conveyed to the patient .   LOS: 3 days   Brenda Bellows, MD 03/02/2021, 8:55 AM

## 2021-03-02 NOTE — Progress Notes (Signed)
Per Dr Darrick Meigs, ok to dc tele

## 2021-03-02 NOTE — Progress Notes (Signed)
Pharmacy Antibiotic Note  Brenda Middleton is a 60 y.o. 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  Pharmacy has been consulted for cefepime dosing. She has a noted allergy to penicillins but has tolerated cefepime with no ADR reported. This is day # 4 of broad-spectrum IV antibiotics.  Plan:     continue cefepime 2 grams IV every 8 hours   Height: 5\' 6"  (167.6 cm) Weight: 51.8 kg (114 lb 3.2 oz) IBW/kg (Calculated) : 59.3  Temp (24hrs), Avg:98.6 F (37 C), Min:97.6 F (36.4 C), Max:99.6 F (37.6 C)  Recent Labs  Lab 02/27/21 0819 02/27/21 0907 02/28/21 0322 02/28/21 1355 02/28/21 2051 03/01/21 0520 03/01/21 1252 03/01/21 2059 03/02/21 0551  WBC 23.6*  --  16.2*   < > 12.6* 14.3* 19.5* 25.1* 19.8*  CREATININE 0.68  --  0.59  --   --  0.70  --   --  0.62  LATICACIDVEN  --  1.6  --   --   --   --   --   --   --    < > = values in this interval not displayed.    Estimated Creatinine Clearance: 61.2 mL/min (by C-G formula based on SCr of 0.62 mg/dL).    Allergies  Allergen Reactions  . Penicillin G Hives    Other reaction(s): HIVES Other reaction(s): HIVES  . Penicillin V Itching  . Penicillins     Antimicrobials this admission: vancomycin 3/27 >> 3/30 cefepime 3/27 >>   Microbiology results: 3/27 BCx: NG x 3 days 3/27 SARS CoV-2: negative 3/27 influenza A/B: negative 3/30 MRSA PCR: negative  Thank you for allowing pharmacy to be a part of this patient's care.  Dallie Piles 03/02/2021 7:05 AM

## 2021-03-02 NOTE — Progress Notes (Signed)
Notified patient that she would need to wait for discharge instruction. She has attempted to leave without paperwork. I instructed her that the doctor has to fill out paperwork and that when its finished we would come in and give it to her. She stated she didn't want to wait and that she would rather leave. Notified her that if she left insurance would not cover her stay here. She would need to stay to receive paperwork. Giving her instructions now for DC home

## 2021-03-02 NOTE — Progress Notes (Signed)
SATURATION QUALIFICATIONS: (This note is used to comply with regulatory documentation for home oxygen)  Patient Saturations on Room Air at Rest = 94%   Patient Saturations on Room Air while Ambulating = ranged from 93-95% RA   Please briefly explain why patient needs home oxygen:  Ambulated patient around unit. She tolerated well. Oxygen levels registered 93-95% RA while ambulating. She did not complain of any pain. Her heart rate registered between 140-145. When getting back to room, she sat on edge of bed, seemed out of breath but has not complained. Notified Dr. Darrick Meigs regarding data.

## 2021-03-02 NOTE — Discharge Summary (Signed)
Physician Discharge Summary  Brenda Middleton AVW:[id number] DOB: 12-24-1960 DOA: 02/27/2021  PCP: Remi Haggard, FNP  Admit date: 02/27/2021 Discharge date: 03/02/2021  Time spent: 50* minutes  Recommendations for Outpatient Follow-up:  1. 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  She was recently hospitalized from 02/18/2021 to 02/21/2021.  At that time she was diagnosed with lung cancer with mets to brain and skull bone.  Patient has been having worsening cough for past 1 week.  CTA chest was negative for PE but showed extensive groundglass opacity.  Hospital Course:  Acute hypoxemic respiratory failure-secondary to healthcare associated pneumonia and lung cancer.  CTA chest was negative for PE.  She was initially requiring oxygen 4 L/min.  Started on scheduled guaifenesin.  She was started on Solu-Medrol, oncology recommended to taper her back to chronic dexamethasone 4 mg twice a day.  She will follow up with oncology as outpatient for further tapering.  On ambulation O2 sats remained 93 to 94% on room air.  Sepsis secondary to pneumonia-sepsis physiology has  resolved.  She presented with leukocytosis, tachycardia, tachypnea.  CT a chest showed groundglass opacity.  Started on vancomycin and cefepime.  Patient has received 4 days of antibiotics in the hospital.  Will discharge on cefuroxime and doxycycline for 3 more days to complete 7 days of treatment.  COPD exacerbation-continue Decadron, as needed nebulizer.  Lung cancer with mets to brain and skull-appreciate oncology evaluation, continue Decadron.  Also started on Keppra for seizure prophylaxis.  Patient to follow-up with oncology as outpatient.  Pathology compression fracture of lumbar vertebra-continue fentanyl patch and as needed Percocet.  Hypertension-blood pressure is little bit elevated, also she is tachycardic on ambulation.  Will start low-dose metoprolol 12.5 mg p.o. twice daily.  Will discontinue amlodipine and HCTZ at this time.  Hematochezia-patient reported complaint of rectal bleeding so GI was consulted.  She had a rectal biopsy which showed atypical squamous cells of undetermined significance.  GI does not recommend any endoscopic procedure considering patient's precarious respiratory status.  Patient does not want to stay in the hospital for further evaluation, and has been threatening to leave AMA.  I called and discussed with patient's sister.  We will discharge her on doxycycline and cefuroxime for 3 more days.  She is not requiring oxygen at this time.  Procedures:    Consultations:  Oncology  Gastroenterology  Discharge Exam: Vitals:   03/02/21 0525 03/02/21 0751  BP: (!) 149/84 (!) 143/84  Pulse: (!) 102 98  Resp: 16 18  Temp: 97.6 F (36.4 C) 98.8 F (37.1 C)  SpO2: 99% 100%    General: Appears in no acute distress Cardiovascular: S1-S2, regular Respiratory: Clear to auscultation  bilaterally  Discharge Instructions   Discharge Instructions    Diet - low sodium heart healthy   Complete by: As directed    Discharge instructions   Complete by: As  directed    Per [place] statutes, patients with seizures/or period of unconsciousnessare not allowed to drive until they have been seizure-free for six months. Use caution when using heavy equipment or power tools. Avoid working on ladders or at heights. Take showers instead of baths. Ensure the water temperature is not too high on the home water heater. Do not go swimming alone. When caring for infants or small children, sit down when holding, feeding, or changing them to minimize risk of injury to the child in the event you have a seizure.   Increase activity slowly   Complete by: As directed      Allergies as of 03/02/2021      Reactions   Penicillin G Hives   Other reaction(s): HIVES Other reaction(s): HIVES   Penicillin V Itching   Penicillins       Medication List    STOP taking these medications   amLODipine 5 MG tablet Commonly known as: NORVASC   hydrochlorothiazide 25 MG tablet Commonly known as: HYDRODIURIL     TAKE these medications   albuterol 108 (90 Base) MCG/ACT inhaler Commonly known as: VENTOLIN HFA Inhale 2 puffs into the lungs every 4 (four) hours as needed for wheezing or shortness of breath.   budesonide-formoterol 80-4.5 MCG/ACT inhaler Commonly known as: SYMBICORT Inhale 2 puffs into the lungs 2 (two) times daily.   busPIRone 10 MG tablet Commonly known as: BUSPAR Take 10 mg by mouth 2 (two) times daily. Patient is unsure of dose   cefUROXime 250 MG tablet Commonly known as: CEFTIN Take 1 tablet (250 mg total) by mouth 2 (two) times daily with a meal.   dexamethasone 4 MG tablet Commonly known as: DECADRON Take 1 tablet (4 mg total) by mouth 2 (two) times daily.   doxycycline 100 MG tablet Commonly known as: VIBRA-TABS Take 1 tablet (100 mg total) by mouth 2 (two) times daily.   feeding supplement Liqd Take 237 mLs by mouth 3 (three) times daily between meals.   fentaNYL 50 MCG/HR Commonly known as: [place] 1 patch  onto the skin every 3 (three) days for 6 days.   gabapentin 300 MG capsule Commonly known as: NEURONTIN Take 300 mg by mouth daily.   guaiFENesin 600 MG 12 hr tablet Commonly known as: MUCINEX Take 2 tablets (1,200 mg total) by mouth 2 (two) times daily.   hydrOXYzine 25 MG tablet Commonly known as: ATARAX/VISTARIL hydroxyzine HCl 25 mg tablet   levETIRAcetam 500 MG tablet Commonly known as: KEPPRA Take 1 tablet (500 mg total) by mouth 2 (two) times daily.   metoprolol tartrate 25 MG tablet Commonly known as: LOPRESSOR Take 0.5 tablets (12.5 mg total) by mouth 2 (two) times daily.   ondansetron 4 MG disintegrating tablet Commonly known as: ZOFRAN-ODT Take 1 tablet (4 mg total) by mouth every 8 (eight) hours as needed.   oxyCODONE-acetaminophen 5-325 MG tablet Commonly known as: PERCOCET/ROXICET Take 1-2 tablets by mouth every 4 (four) hours as needed for severe pain.   pantoprazole 40 MG tablet Commonly known as: PROTONIX Take 40 mg by mouth daily.   prochlorperazine 10 MG tablet Commonly known as: COMPAZINE Take 10 mg by mouth every 6 (six) hours as needed for nausea or vomiting.   promethazine 12.5 MG tablet Commonly known  as: PHENERGAN Take 1 tablet (12.5 mg total) by mouth every 6 (six) hours as needed for nausea or vomiting.   QUEtiapine 400 MG tablet Commonly known as: SEROQUEL Take 400 mg by mouth at bedtime.      Allergies  Allergen Reactions  . Penicillin G Hives    Other reaction(s): HIVES Other reaction(s): HIVES  . Penicillin V Itching  . Penicillins       The results of significant diagnostics from this hospitalization (including imaging, microbiology, ancillary and laboratory) are listed below for reference.    Significant Diagnostic Studies: DG Chest 2 View  Result Date: 02/15/2021 CLINICAL DATA:  Weight loss and cough EXAM: CHEST - 2 VIEW COMPARISON:  None. FINDINGS: The heart size and mediastinal contours are within normal limits.  Rounded patchy nodular opacity seen within the right upper lobe and right mid lung. The left lung is clear. No acute osseous abnormality. IMPRESSION: Rounded patchy airspace opacities in the right mid lung which may be due to infectious etiology, however cannot exclude metastatic disease. Would recommend CT chest with contrast for further evaluation. Electronically Signed   By: Prudencio Pair M.D.   On: 02/15/2021 12:16   CT Head Wo Contrast  Result Date: 02/15/2021 CLINICAL DATA:  Headache with left-sided face/neck/head pain. On antibiotics for reported laryngitis with bilateral ear pain. EXAM: CT HEAD WITHOUT CONTRAST CT MAXILLOFACIAL WITHOUT CONTRAST TECHNIQUE: Multidetector CT imaging of the head and maxillofacial structures were performed using the standard protocol without intravenous contrast. Multiplanar CT image reconstructions of the maxillofacial structures were also generated. COMPARISON:  None. FINDINGS: CT HEAD FINDINGS Brain: The calvarial/skull base mass lesion is described below. Spur this process exerts mass effect on the left cerebellum with suspected narrowing of the fourth ventricle. No evidence of hydrocephalus. No evidence of acute large vascular territory infarct. No acute hemorrhage. Mild to moderate scattered white matter hypodensities, which are nonspecific but most likely relate to chronic microvascular ischemic disease. No midline shift. Vascular: No hyperdense vessel identified. Calcific atherosclerosis. Skull/skull base: There is aggressive soft tissue mass lesion involving the left occipital calvarium measuring up to approximately 5.5 x 3.1 by 3.3 cm (transverse by AP by craniocaudal) with bony destruction and expansion and extension inferiorly to involve the left temporal bone and skull base. There is mass effect on the inferior left cerebellum and possible involvement of the subjacent dural venous sinuses. Small left mastoid effusion. CT MAXILLOFACIAL FINDINGS Osseous: No fracture  or mandibular dislocation. No destructive process in the face. Orbits: Negative. No traumatic or inflammatory finding. Sinuses: Small right maxillary sinus retention cyst. Otherwise, sinuses are clear. Soft tissues: Negative. 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 Resulting mass effect on the left cerebellum and possible involvement of the subjacent dural venous sinuses. 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 Osteomyelitis is a differential consideration. Recommend MRI brain with and without contrast to further evaluate. Findings and recommendations discussed with Ashok Cordia, PA via telephone at 11:20 a.m. Electronically Signed   By: Margaretha Sheffield MD   On: 02/15/2021 11:29   CT Angio Chest PE W and/or Wo Contrast  Result Date: 02/27/2021 CLINICAL DATA:  PE suspected, widely metastatic lung cancer EXAM: CT ANGIOGRAPHY CHEST WITH CONTRAST TECHNIQUE: Multidetector CT imaging of the chest was performed using the standard protocol during bolus administration of intravenous contrast. Multiplanar CT image  reconstructions and MIPs were obtained to evaluate the vascular anatomy. CONTRAST:  109mL OMNIPAQUE IOHEXOL 350 MG/ML SOLN COMPARISON:  02/15/2021 FINDINGS: Cardiovascular: Satisfactory opacification of the pulmonary arteries to the segmental level. No evidence of pulmonary embolism. Cardiomegaly. Left and right coronary artery calcifications and stents. No pericardial effusion. Mediastinum/Nodes: Redemonstrated, very extensive bulky bilateral hilar, mediastinal, supraclavicular, and left axillary lymphadenopathy. Thyroid gland, trachea, and esophagus demonstrate no significant findings. Lungs/Pleura: Diffuse bilateral bronchial wall thickening. Spiculated right upper lobe mass as seen on prior examination (series 7,  image 42). Numerous redemonstrated small pulmonary nodules throughout the lungs. There is new, extensive ground-glass airspace opacity throughout the lungs, somewhat geographic in the upper lobes (series 7, image 33), although somewhat more nodular appearing in the lower lungs (series 7, image 58). No pleural effusion or pneumothorax. Upper Abdomen: No acute abnormality. Multiple low-attenuation lesions of liver, better assessed by prior dedicated of the abdomen. Musculoskeletal: No chest wall abnormality. Multiple osseous metastatic lesions, most significantly a lytic lesion of the T9 vertebral body (series 9, image 58). Review of the MIP images confirms the above findings. IMPRESSION: 1. Negative examination for pulmonary embolism. 2. There is new, extensive ground-glass airspace opacity throughout the lungs, somewhat geographic in the upper lobes, although somewhat more nodular appearing in the lower lungs. Findings are nonspecific and infectious or inflammatory, differential considerations primarily include drug toxicity and atypical/viral infection. 3. Diffuse bilateral bronchial wall thickening, consistent with nonspecific infectious or inflammatory bronchitis. 4. Redemonstrated findings of advanced metastatic lung malignancy, better assessed by recent prior staging examination. 5. Coronary artery disease. Electronically Signed   By: Eddie Candle M.D.   On: 02/27/2021 10:49   CT Thoracic Spine Wo Contrast  Result Date: 02/18/2021 CLINICAL DATA:  Metastases on CT abdomen EXAM: CT THORACIC AND LUMBAR SPINE WITHOUT CONTRAST TECHNIQUE: Multidetector CT imaging of the thoracic and lumbar spine was performed without contrast. Multiplanar CT image reconstructions were also generated. COMPARISON:  None. FINDINGS: CT THORACIC SPINE FINDINGS Alignment: Preserved. Vertebrae: There is a small partially sclerotic lesion at the right aspect of the T5 vertebral body. A lytic lesion is present at the anterior aspect T9  likely with pathologic fracture but no substantial height loss. Ill-defined lucent lesions are present elsewhere. Paraspinal and other soft tissues: Better evaluated on prior dedicated imaging. Disc levels: Multilevel degenerative changes. No high-grade degenerative canal narrowing. CT LUMBAR SPINE FINDINGS Segmentation: 5 lumbar type vertebrae. Alignment: Dextrocurvature.  Grade 1 anterolisthesis at L4-L5 Vertebrae: Lucency and sclerosis at L2 with pathologic compression fracture resulting in less than 50% loss of height at the superior endplate. Sclerotic lesion of the right sacrum. Paraspinal and other soft tissues: Better evaluated on prior dedicated imaging. Disc levels: Multilevel degenerative changes. Canal stenosis is greatest at L3-L4 and L4-L5. Foraminal narrowing is greatest on the right at L5-S1. IMPRESSION: Sclerotic and lytic metastatic lesions as already identified on the CT chest, abdomen, and pelvis. Pathologic L2 fracture with less than 50% loss of height. Probable pathologic fracture at T9 without height loss. No apparent significant epidural disease by CT. Electronically Signed   By: Macy Mis M.D.   On: 02/18/2021 10:53   CT Lumbar Spine Wo Contrast  Result Date: 02/18/2021 CLINICAL DATA:  Metastases on CT abdomen EXAM: CT THORACIC AND LUMBAR SPINE WITHOUT CONTRAST TECHNIQUE: Multidetector CT imaging of the thoracic and lumbar spine was performed without contrast. Multiplanar CT image reconstructions were also generated. COMPARISON:  None. FINDINGS: CT THORACIC SPINE FINDINGS Alignment: Preserved. Vertebrae: There is a small  partially sclerotic lesion at the right aspect of the T5 vertebral body. A lytic lesion is present at the anterior aspect T9 likely with pathologic fracture but no substantial height loss. Ill-defined lucent lesions are present elsewhere. Paraspinal and other soft tissues: Better evaluated on prior dedicated imaging. Disc levels: Multilevel degenerative changes. No  high-grade degenerative canal narrowing. CT LUMBAR SPINE FINDINGS Segmentation: 5 lumbar type vertebrae. Alignment: Dextrocurvature.  Grade 1 anterolisthesis at L4-L5 Vertebrae: Lucency and sclerosis at L2 with pathologic compression fracture resulting in less than 50% loss of height at the superior endplate. Sclerotic lesion of the right sacrum. Paraspinal and other soft tissues: Better evaluated on prior dedicated imaging. Disc levels: Multilevel degenerative changes. Canal stenosis is greatest at L3-L4 and L4-L5. Foraminal narrowing is greatest on the right at L5-S1. IMPRESSION: Sclerotic and lytic metastatic lesions as already identified on the CT chest, abdomen, and pelvis. Pathologic L2 fracture with less than 50% loss of height. Probable pathologic fracture at T9 without height loss. No apparent significant epidural disease by CT. Electronically Signed   By: Macy Mis M.D.   On: 02/18/2021 10:53   MR Brain W and Wo Contrast  Result Date: 02/15/2021 CLINICAL DATA:  Abnormal head CT.  Skull base mass. EXAM: MRI HEAD WITHOUT AND WITH CONTRAST TECHNIQUE: Multiplanar, multiecho pulse sequences of the brain and surrounding structures were obtained without and with intravenous contrast. CONTRAST:  54mL GADAVIST GADOBUTROL 1 MMOL/ML IV SOLN COMPARISON:  CT head 02/15/2021 FINDINGS: Brain: Large destructive mass in the posterior skull base bilaterally left greater than right. This involves the occipital bone with extension into the left temporal bone. The occipital bone is significantly expanded due to tumor on the left with mass-effect on the left cerebellum. Soft tissue mass associated with left occipital lesion measures approximately 5.2 x 3.0 cm. Mild edema in the left cerebellum. There is infiltrating tumor in the left temporal bone which is more sizable than would be predicted from the CT. The soft tissue mass associated with the left occipital bone shows susceptibility which may be due to mineralization  and/or bone fragments from bone destruction. Ventricle size normal. No midline shift. Moderate white matter changes likely due to chronic microvascular ischemia. No acute infarct. Chronic microhemorrhage in the left parietal lobe. Subtle enhancing lesions in the brain in the right frontal lobe measuring approximately 10 mm, and 4 mm best seen on coronal and sagittal postcontrast imaging. This is suspicious for metastatic disease. Vascular: Normal arterial flow voids. Normal venous enhancement. No evidence of venous sinus thrombosis. Skull and upper cervical spine: Infiltrating destructive mass in the occipital bone bilaterally left greater than right with extension into the left temporal bone. Additional lesions in the frontal bone bilaterally and in the left parietal bone likely metastatic disease. Sinuses/Orbits: Mild mucosal edema paranasal sinuses. Negative orbit Other: None IMPRESSION: Destructive mass lesion in the occipital bone bilaterally left greater than right. There is associated soft tissue mass in the left occipital bone extending into the posterior fossa with mass-effect and mild edema of the left cerebellum. This mass extends into the left temporal bone. Additional bone lesions are present in the frontal bones and left parietal bone. Subtle enhancing lesions right frontal lobe measuring 10 mm and 4 mm. Findings compatible with metastatic disease. Electronically Signed   By: Franchot Gallo M.D.   On: 02/15/2021 13:47   NM Bone Scan Whole Body  Result Date: 02/27/2021 CLINICAL DATA:  Metastatic disease evaluation, lung mass, abnormal CT EXAM: NUCLEAR MEDICINE WHOLE  BODY BONE SCAN TECHNIQUE: Whole body anterior and posterior images were obtained approximately 3 hours after intravenous injection of radiopharmaceutical. RADIOPHARMACEUTICALS:  19.79 mCi Technetium-65m MDP IV COMPARISON:  None Correlation: CT chest abdomen pelvis 02/15/2021, CT thoracic and lumbar spine 02/18/2021, CT head 02/15/2021  FINDINGS: Multiple sites of abnormal tracer uptake are identified consistent with widespread osseous metastatic disease. These include calvaria greatest at occipital bone growth more on LEFT, thoracic and lumbar spine, RIGHT ribs, and pelvis. Dextroconvex thoracolumbar scoliosis. No definite abnormal tracer uptake within the humeri or femora. Expected urinary tract and soft tissue distribution of tracer. IMPRESSION: Scattered osseous metastases as above. Electronically Signed   By: Lavonia Dana M.D.   On: 02/27/2021 18:17   US Renal  Result Date: 02/18/2021 CLINICAL DATA:  Back pain. Right renal pelvis fullness on right upper quadrant ultrasound performed 1 day prior EXAM: RENAL / URINARY TRACT ULTRASOUND COMPLETE COMPARISON:  02/17/2021 abdominal sonogram. FINDINGS: Right Kidney: Renal measurements: 10.3 x 4.2 x 4.2 cm = volume: 95 mL. Normal parenchymal echogenicity and thickness. Mild right hydronephrosis. Possible 5 mm right ureteropelvic junction stone. Nonobstructing 3 mm lower right renal stone. No renal masses. Left Kidney: Renal measurements: 10.0 x 4.8 x 4.2 cm = volume: 107 mL. Normal parenchymal echogenicity and thickness. No left hydronephrosis. No renal masses. Probable nonobstructing 7 mm upper left renal stone. Bladder: Appears normal for degree of bladder distention. Bilateral ureteral jets seen in the bladder. Other: None. IMPRESSION: 1. Mild right hydronephrosis. Possible 5 mm right UPJ stone. Nonobstructing 3 mm lower right renal stone. Suggest unenhanced CT abdomen/pelvis for further evaluation. 2. Probable nonobstructing upper left renal stone. Electronically Signed   By: Ilona Sorrel M.D.   On: 02/18/2021 12:02   CT CHEST ABDOMEN PELVIS W CONTRAST  Result Date: 02/15/2021 CLINICAL DATA:  Altered mental status. 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 EXAM: CT CHEST, ABDOMEN, AND PELVIS WITH CONTRAST TECHNIQUE: Multidetector CT imaging of the chest, abdomen and pelvis  was performed following the standard protocol during bolus administration of intravenous contrast. CONTRAST:  75 mL OMNIPAQUE IOHEXOL 300 MG/ML  SOLN COMPARISON:  None. FINDINGS: CT CHEST FINDINGS Cardiovascular: No significant vascular findings. Normal heart size. No pericardial effusion. Mediastinum/Nodes: The patient has extensive lymphadenopathy. A 2 cm left axillary node is seen on image 11 of series 2; a second left axillary node on image 14 measures 1.4 cm. A 1.1 cm subpectoral node is seen on the left on image 15 of series 2. A 1.4 cm left supraclavicular node is seen on image 9. Right paratracheal node on image 15 measures 1.9 cm. Right precarinal nodal mass on image 25 measures 2.6 cm and there is a right hilar nodal mass measuring 4.2 x 3.1 cm on image 30. The esophagus and thyroid are negative. Lungs/Pleura: Small right pleural effusion. A spiculated nodule in the right middle lobe measures 2.6 cm transverse by 2.7 cm AP on image 77, series 4. Scattered pulmonary nodules include a 0.5 cm nodule in the superior segment of the left lower lobe on image 80, 0.4 cm left upper lobe nodule on image 76 and 2 punctate nodules on image 83. Additional smaller nodules are seen scattered through both lungs. The lungs are emphysematous. No pleural effusion. Musculoskeletal: A 0.9 cm lytic lesion is seen in T5, lytic lesions are seen in T7 and T9, larger in T9. There is also a small lytic lesion in T10. CT ABDOMEN PELVIS FINDINGS Hepatobiliary: Metastatic lesions are seen in  the left hepatic lobe measuring 1.7 cm on image 57, series 2 and near the dome of the liver on image 50 of series 2 where a 1.1 cm lesion is identified. A second lesion near the dome of the liver in the right hepatic lobe on image 50 measures 0.7 cm. There is mild intra and extrahepatic biliary ductal dilatation likely related to prior cholecystectomy. Pancreas: Unremarkable. No pancreatic ductal dilatation or surrounding inflammatory changes.  Spleen: Normal in size without focal abnormality. Adrenals/Urinary Tract: Adrenal glands are unremarkable. Kidneys are normal, without renal calculi, focal lesion, or hydronephrosis. Bladder is unremarkable. Stomach/Bowel: Stomach is within normal limits. Appendix appears normal. No evidence of bowel wall thickening, distention, or inflammatory changes. Moderately large colonic stool burden throughout noted. Vascular/Lymphatic: Aortic atherosclerosis. No aneurysm. No pathologic lymphadenopathy by CT size criteria. Reproductive: Status post hysterectomy. No adnexal masses. Other: None. Musculoskeletal: Mottled appearance of the L2 vertebral body is consistent with metastatic disease. There is a mild pathologic superior endplate compression fracture of L2 with vertebral body height loss centrally of approximately 30%. IMPRESSION: Findings consistent with extensive metastatic carcinoma likely emanating from a 2.7 cm right middle lobe pulmonary nodule. Metastases include extensive lymphadenopathy in the chest,, pulmonary nodule bone lesions and liver lesions. Mild pathologic fracture of L2 with vertebral body height loss centrally of up to approximately 30% is again noted. Aortic Atherosclerosis (ICD10-I70.0) and Emphysema (ICD10-J43.9). Electronically Signed   By: Inge Rise M.D.   On: 02/15/2021 14:25   DG Chest Port 1 View  Result Date: 02/27/2021 CLINICAL DATA:  Worsening shortness of breath since this morning. Recent diagnosis of lung cancer. EXAM: PORTABLE CHEST 1 VIEW COMPARISON:  Radiograph earlier today.  CT earlier today. FINDINGS: Stable heart size and mediastinal contours. Right hilar prominence and thickening of the lower paratracheal stripe related to known adenopathy. Unchanged right mid lung pulmonary nodule. Mild patchy bilateral suprahilar opacities corresponding to ground-glass opacity on CT earlier today. No significant change in the interim. No pneumothorax or large pleural effusion.  Retained excreted IV contrast in the right renal collecting system with right hydronephrosis, partially included in the upper abdomen. IMPRESSION: 1. Stable radiographic appearance of the chest from earlier today. Similar patchy suprahilar opacities corresponding to ground-glass opacity on CT. 2. Right mid lung pulmonary nodule and thoracic adenopathy. 3. Partially included in the upper abdomen is retained excreted contrast in the right renal collecting system with right hydronephrosis. Right hydronephrosis was seen on renal ultrasound 02/18/2021. Electronically Signed   By: Keith Rake M.D.   On: 02/27/2021 15:04   DG Chest Portable 1 View  Result Date: 02/27/2021 CLINICAL DATA:  60 year old female with shortness of breath and cough. Metastatic lung cancer. EXAM: PORTABLE CHEST 1 VIEW COMPARISON:  02/15/2021 and prior studies FINDINGS: New hazy opacities within the central/upper lungs noted. RIGHT middle lobe mass and small scattered pulmonary nodules bilaterally are again identified. No pleural effusion or pneumothorax. No acute bony abnormalities are identified. IMPRESSION: New hazy opacities within the central/upper lungs which may represent edema or infection. Unchanged RIGHT middle lobe mass and bilateral pulmonary nodules compatible with known malignancy/metastases. Electronically Signed   By: Margarette Canada M.D.   On: 02/27/2021 08:49   [place] CORE BIOPSY (LYMPH NODES)  Result Date: 02/21/2021 INDICATION: Multiple enlarged lymph nodes, right middle lobe lung mass, liver lesions and bone lesions. EXAM: ULTRASOUND GUIDED CORE BIOPSY OF LEFT SUPRACLAVICULAR LYMPH NODE MEDICATIONS: None. ANESTHESIA/SEDATION: Versed 2.0 mg IV Moderate Sedation Time:  12 minutes. The patient was  continuously monitored during the procedure by the interventional radiology nurse under my direct supervision. PROCEDURE: The procedure, risks, benefits, and alternatives were explained to the patient. Questions regarding the  procedure were encouraged and answered. The patient understands and consents to the procedure. A time-out was performed prior to initiating the procedure. The left neck was prepped with chlorhexidine in a sterile fashion, and a sterile drape was applied covering the operative field. A sterile gown and sterile gloves were used for the procedure. Local anesthesia was provided with 1% Lidocaine. After localizing an enlarged left supraclavicular lymph node by ultrasound, multiple 18 gauge core biopsy samples were obtained. Core biopsy samples were submitted in formalin as well as on saline soaked Telfa gauze. Additional ultrasound was performed. COMPLICATIONS: None immediate. FINDINGS: An enlarged left supraclavicular lymph node measures approximately 3.0 x 1.8 x 2.9 cm. Solid core biopsy samples were obtained. IMPRESSION: Ultrasound-guided core biopsy performed of an enlarged left supraclavicular lymph node measuring 3 cm in greatest dimensions. Electronically Signed   By: Aletta Edouard M.D.   On: 02/21/2021 15:31   CT Maxillofacial Wo Contrast  Result Date: 02/15/2021 CLINICAL DATA:  Headache with left-sided face/neck/head pain. On antibiotics for reported laryngitis with bilateral ear pain. EXAM: CT HEAD WITHOUT CONTRAST CT MAXILLOFACIAL WITHOUT CONTRAST TECHNIQUE: Multidetector CT imaging of the head and maxillofacial structures were performed using the standard protocol without intravenous contrast. Multiplanar CT image reconstructions of the maxillofacial structures were also generated. COMPARISON:  None. FINDINGS: CT HEAD FINDINGS Brain: The calvarial/skull base mass lesion is described below. Spur this process exerts mass effect on the left cerebellum with suspected narrowing of the fourth ventricle. No evidence of hydrocephalus. No evidence of acute large vascular territory infarct. No acute hemorrhage. Mild to moderate scattered white matter hypodensities, which are nonspecific but most likely relate to  chronic microvascular ischemic disease. No midline shift. Vascular: No hyperdense vessel identified. Calcific atherosclerosis. Skull/skull base: There is aggressive soft tissue mass lesion involving the left occipital calvarium measuring up to approximately 5.5 x 3.1 by 3.3 cm (transverse by AP by craniocaudal) with bony destruction and expansion and extension inferiorly to involve the left temporal bone and skull base. There is mass effect on the inferior left cerebellum and possible involvement of the subjacent dural venous sinuses. Small left mastoid effusion. CT MAXILLOFACIAL FINDINGS Osseous: No fracture or mandibular dislocation. No destructive process in the face. Orbits: Negative. No traumatic or inflammatory finding. Sinuses: Small right maxillary sinus retention cyst. Otherwise, sinuses are clear. Soft tissues: Negative. 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 Resulting mass effect on the left cerebellum and possible involvement of the subjacent dural venous sinuses. 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 Osteomyelitis is a differential consideration. Recommend MRI brain with and without contrast to further evaluate. Findings and recommendations discussed with Ashok Cordia, PA via telephone at 11:20 a.m. Electronically Signed   By: Margaretha Sheffield MD   On: 02/15/2021 11:29   US Abdomen Limited RUQ (LIVER/GB)  Result Date: 02/17/2021 CLINICAL DATA:  Nausea and vomiting for 1 day. EXAM: ULTRASOUND ABDOMEN LIMITED RIGHT UPPER QUADRANT COMPARISON:  CT chest, abdomen and pelvis 02/15/2021. FINDINGS: Gallbladder: Removed. Common bile duct: Diameter: 0.8 cm. Liver: Three hypoechoic liver lesions measuring up to 1.8 cm are identified are identified and correlate with metastatic deposit seen on prior CT. Mild  intrahepatic biliary ductal dilatation seen on CT is also noted. Portal vein is patent on color Doppler imaging with normal direction of blood flow towards the liver. Other: There is fullness of the right renal pelvis which is new since the patient's CT scan yesterday. IMPRESSION: Three hypoechoic liver lesions measuring up to 1.8 cm correlate with metastatic deposit seen on CT. Status post cholecystectomy. Mild intra and extrahepatic biliary ductal dilatation is likely related to cholecystectomy. No obstructing lesion is identified. Fullness of the right renal pelvis is new since yesterday's examination. This could be incidental. Correlation with dedicated renal ultrasound could be used for further evaluation if indicated. Electronically Signed   By: Inge Rise M.D.   On: 02/17/2021 12:53    Microbiology: Recent Results (from the past 240 hour(s))  Culture, blood (single)     Status: None (Preliminary result)   Collection Time: 02/27/21  9:07 AM   Specimen: BLOOD  Result Value Ref Range Status   Specimen Description BLOOD RIGHT FOREARM  Final   Special Requests   Final    BOTTLES DRAWN AEROBIC AND ANAEROBIC Blood Culture adequate volume   Culture   Final    NO GROWTH 3 DAYS Performed at [facility], [address] [place], [place] [address]    Report Status PENDING  Incomplete  Resp Panel by RT-PCR (Flu A&B, Covid) Nasopharyngeal Swab     Status: None   Collection Time: 02/27/21  9:07 AM   Specimen: Nasopharyngeal Swab; Nasopharyngeal(NP) swabs in vial transport medium  Result Value Ref Range Status   SARS Coronavirus 2 by RT PCR NEGATIVE NEGATIVE Final    Comment: (NOTE) SARS-CoV-2 target nucleic acids are NOT DETECTED.  The SARS-CoV-2 RNA is generally detectable in upper respiratory specimens during the acute phase of infection. The lowest concentration of SARS-CoV-2 viral copies this assay can detect is 138 copies/mL. A negative result does not preclude  SARS-Cov-2 infection and should not be used as the sole basis for treatment or other patient management decisions. A negative result may occur with  improper specimen collection/handling, submission of specimen other than nasopharyngeal swab, presence of viral mutation(s) within the areas targeted by this assay, and inadequate number of viral copies(<138 copies/mL). A negative result must be combined with clinical observations, patient history, and epidemiological information. The expected result is Negative.  Fact Sheet for Patients:  EntrepreneurPulse.com.au  Fact Sheet for Healthcare Providers:  IncredibleEmployment.be  This test is no t yet approved or cleared by the [place] FDA and  has been authorized for detection and/or diagnosis of SARS-CoV-2 by FDA under an Emergency Use Authorization (EUA). This EUA will remain  in effect (meaning this test can be used) for the duration of the COVID-19 declaration under Section 564(b)(1) of the Act, 21 U.S.C.section 360bbb-3(b)(1), unless the authorization is terminated  or revoked sooner.       Influenza A by PCR NEGATIVE NEGATIVE Final   Influenza B by PCR NEGATIVE NEGATIVE Final    Comment: (NOTE) The Xpert Xpress SARS-CoV-2/FLU/RSV plus assay is intended as an aid in the diagnosis of influenza from Nasopharyngeal swab specimens and should not be used as a sole basis for treatment. Nasal washings and aspirates are unacceptable for Xpert Xpress SARS-CoV-2/FLU/RSV testing.  Fact Sheet for Patients: EntrepreneurPulse.com.au  Fact Sheet for Healthcare Providers: IncredibleEmployment.be  This test is not yet approved or cleared by the [place] FDA and has been authorized for detection and/or diagnosis of SARS-CoV-2 by FDA under an Emergency Use Authorization (  EUA). This EUA will remain in effect (meaning this test can be used) for the duration of  the COVID-19 declaration under Section 564(b)(1) of the Act, 21 U.S.C. section 360bbb-3(b)(1), unless the authorization is terminated or revoked.  Performed at [facility], [address] [place], [place] [address]   MRSA PCR Screening     Status: None   Collection Time: 03/02/21  8:15 AM   Specimen: Nasopharyngeal  Result Value Ref Range Status   MRSA by PCR NEGATIVE NEGATIVE Final    Comment:        The GeneXpert MRSA Assay (FDA approved for NASAL specimens only), is one component of a comprehensive MRSA colonization surveillance program. It is not intended to diagnose MRSA infection nor to guide or monitor treatment for MRSA infections. Performed at [facility], [address] [place], [place] [address]      Labs: Basic Metabolic Panel: Recent Labs  Lab 02/27/21 [phone] 02/28/21 0322 03/01/21 0520 03/02/21 0551  NA 137 140 139 138  K 3.8 3.7 4.1 4.0  CL 102 104 109 110  CO2 25 28 24  21*  GLUCOSE 164* 108* 129* 124*  BUN 21* 19 23* 24*  CREATININE 0.68 0.59 0.70 0.62  CALCIUM 8.3* 8.4* 8.5* 8.4*  MG 2.0  --   --   --    Liver Function Tests: Recent Labs  Lab 02/27/21 0819  AST 29  ALT 32  ALKPHOS 312*  BILITOT 0.5  PROT 6.8  ALBUMIN 3.3*   No results for input(s): LIPASE, AMYLASE in the last 168 hours. Recent Labs  Lab 02/27/21 1243  AMMONIA 17   CBC: Recent Labs  Lab 02/27/21 0819 02/28/21 0322 03/01/21 0520 03/01/21 1252 03/01/21 2059 03/02/21 0551 03/02/21 1349  WBC 23.6*   < > 14.3* 19.5* 25.1* 19.8* 24.0*  NEUTROABS 20.5*  --   --   --   --   --   --   HGB 10.7*   < > 8.4* 9.6* 10.0* 8.7* 9.2*  HCT 32.4*   < > 25.9* 28.9* 29.9* 26.6* 28.8*  MCV 86.6   < > 88.1 87.6 87.2 87.2 90.3  PLT 241   < > 200 235 292 231 244   < > = values in this interval not displayed.   Cardiac Enzymes: No results for input(s): CKTOTAL, CKMB, CKMBINDEX, TROPONINI in the last 168 hours. BNP: BNP (last 3 results) Recent Labs     02/27/21 0819  BNP 127.2*    ProBNP (last 3 results) No results for input(s): PROBNP in the last 8760 hours.  CBG: Recent Labs  Lab 02/28/21 0813  GLUCAP 100*       Signed:  Oswald Hillock MD.  [facility] 03/02/2021, 3:33 PM

## 2021-03-02 NOTE — Progress Notes (Signed)
Patient refused wheelchair. Offered and she stated that she would rather walk out. Patient gathered all items and is walking out of unit with paperwork.

## 2021-03-03 ENCOUNTER — Other Ambulatory Visit: Payer: Self-pay | Admitting: Oncology

## 2021-03-03 ENCOUNTER — Other Ambulatory Visit: Payer: Medicaid Other

## 2021-03-03 ENCOUNTER — Telehealth: Payer: Self-pay | Admitting: Oncology

## 2021-03-03 DIAGNOSIS — C3490 Malignant neoplasm of unspecified part of unspecified bronchus or lung: Secondary | ICD-10-CM

## 2021-03-03 DIAGNOSIS — Z7189 Other specified counseling: Secondary | ICD-10-CM

## 2021-03-03 NOTE — Telephone Encounter (Addendum)
Dr. Tasia Catchings, when and what would you like her to be scheduled?

## 2021-03-03 NOTE — Telephone Encounter (Signed)
Discharge summary states Follow up with Earlie Server, MD (Oncology) in 2 weeks (03/18/2021).  Routing to team for scheduling follow up.  Patient seen 09/10/2019 as [place]

## 2021-03-03 NOTE — Progress Notes (Signed)
Tumor Board Documentation  Brenda Middleton was presented by Dr Tasia Catchings at our Tumor Board on 03/03/2021, which included representatives from medical oncology,radiation oncology,internal medicine,navigation,pathology,radiology,surgical,pharmacy,research,palliative care,pulmonology.  Brenda Middleton currently presents for MDC,for new positive pathology,as a current patient with history of the following treatments: surgical intervention(s).  Additionally, we reviewed previous medical and familial history, history of present illness, and recent lab results along with all available histopathologic and imaging studies. The tumor board considered available treatment options and made the following recommendations: Chemotherapy,Palliative radiation therapy,Additional screening (After recovery from HCAP, NGS testing)    The following procedures/referrals were also placed: No orders of the defined types were placed in this encounter.   Clinical Trial Status: not discussed   Staging used: AJCC Stage Group  AJCC Staging:       Group: Stage IV Metastatic Lung cancer   National site-specific guidelines NCCN were discussed with respect to the case.  Tumor board is a meeting of clinicians from various specialty areas who evaluate and discuss patients for whom a multidisciplinary approach is being considered. Final determinations in the plan of care are those of the provider(s). The responsibility for follow up of recommendations given during tumor board is that of the provider.   Today's extended care, comprehensive team conference, Brenda Middleton was not present for the discussion and was not examined.   Multidisciplinary Tumor Board is a multidisciplinary case peer review process.  Decisions discussed in the Multidisciplinary Tumor Board reflect the opinions of the specialists present at the conference without having examined the patient.  Ultimately, treatment and diagnostic decisions rest with the primary provider(s) and  the patient.

## 2021-03-04 ENCOUNTER — Other Ambulatory Visit: Payer: Self-pay | Admitting: Oncology

## 2021-03-04 ENCOUNTER — Encounter: Payer: Self-pay | Admitting: *Deleted

## 2021-03-04 DIAGNOSIS — C3490 Malignant neoplasm of unspecified part of unspecified bronchus or lung: Secondary | ICD-10-CM

## 2021-03-04 LAB — CULTURE, BLOOD (SINGLE)
Culture: NO GROWTH
Special Requests: ADEQUATE

## 2021-03-04 MED ORDER — LIDOCAINE-PRILOCAINE 2.5-2.5 % EX CREA: TOPICAL_CREAM | CUTANEOUS | 3 refills | Status: AC

## 2021-03-04 MED ORDER — ONDANSETRON HCL 8 MG PO TABS: 8.0000 mg | ORAL_TABLET | Freq: Two times a day (BID) | ORAL | 1 refills | Status: DC | PRN

## 2021-03-04 MED ORDER — FOLIC ACID 1 MG PO TABS: 1.0000 mg | ORAL_TABLET | Freq: Every day | ORAL | 3 refills | Status: DC

## 2021-03-04 MED ORDER — PROCHLORPERAZINE MALEATE 10 MG PO TABS: 10.0000 mg | ORAL_TABLET | Freq: Four times a day (QID) | ORAL | 1 refills | Status: DC | PRN

## 2021-03-04 NOTE — Telephone Encounter (Signed)
Please arrange her to see Radonc ASAP- lung cancer brain mets.  Refer her to vascular surgeon for medi port placement ASAP.  Chemo class carboplatina and Alimta.  I have sent pre med to her pharmacy and please ask her to pick up. Start folvite daily.  Please arrange her to do 1 dose of Vitamin b12 1047mcg x 1 next week.  Let me see her next week- MD only.  Plan lab md [place] /alimta week of 4/11.

## 2021-03-04 NOTE — Progress Notes (Signed)
  Oncology Nurse Navigator Documentation  Navigator Location: [facility] (03/04/21 1000)   )Navigator Encounter Type: Telephone (03/04/21 1000) Telephone: Appt Confirmation/Clarification;Outgoing Call (03/04/21 1000)                     Treatment Phase: Pre-Tx/Tx Discussion (03/04/21 1000) Barriers/Navigation Needs: Coordination of [facility] Admissions;Unemployed (03/04/21 1000)   Interventions: Coordination of Care (03/04/21 1000)   Coordination of Care: Appts (03/04/21 1000)       phone call made to patient and her sister to review upcoming appt for pt to consult with Dr. Baruch Gouty on Monday 03/07/21 at 1pm. Reviewed what to expect at appt. All questions answered during call. Contact info given and instructed to call with any further questions or needs. Pt's sister, [name], verbalized understanding. Nothing further needed at this time.           Time Spent with Patient: 30 (03/04/21 1000)

## 2021-03-04 NOTE — Telephone Encounter (Signed)
Done... All appts has been sched as requested

## 2021-03-04 NOTE — Progress Notes (Addendum)
CLINICAL TRIAL SELECTED - Non-Small Cell Lung  Trial: A Master Protocol to Evaluate Biomarker-Driven Therapies and Immunotherapies in Previously-Treated Non-Small Cell Lung Cancer (Lung-MAP Screening Study)  **Trial eligibility and accrual should be confirmed by your research team**  Patient Characteristics: Stage IV Metastatic, Nonsquamous, Awaiting Molecular Test Results and Need to Start Chemotherapy, PS = 0, 1 Therapeutic Status: Stage IV Metastatic Histology: Nonsquamous Cell Broad Molecular Profiling Status: Awaiting Molecular Test Results and Need to Start Chemotherapy ECOG Performance Status: 1 Intent of Therapy: Non-Curative / Palliative Intent, Not Discussed with Patient

## 2021-03-04 NOTE — Telephone Encounter (Signed)
Pt scheduled to see radonc on Monday 4/4 at 1pm. Pt sister is aware.   Ellison Hughs- can you schedule the rest? I will enter her referral to vascular and let her know about the rest of her appts on Monday.

## 2021-03-04 NOTE — Progress Notes (Signed)
DISCONTINUE SELECTED CLINICAL TRIAL - Non-Small Cell Lung  Trial: A Master Protocol to Evaluate Biomarker-Driven Therapies and Immunotherapies in Previously-Treated Non-Small Cell Lung Cancer (Lung-MAP Screening Study)  **Trial eligibility and accrual should be confirmed by your research team**  REASON: Other Reason PRIOR TREATMENT: Trial: A Master Protocol to Evaluate Biomarker-Driven Therapies and Immunotherapies in Previously-Treated Non-Small Cell Lung Cancer (Lung-MAP Screening Study) TREATMENT RESPONSE: Unable to Evaluate  START ON PATHWAY REGIMEN - Non-Small Cell Lung     A cycle is every 21 days:     Pemetrexed      Carboplatin   **Always confirm dose/schedule in your pharmacy ordering system**  Patient Characteristics: Stage IV Metastatic, Nonsquamous, Awaiting Molecular Test Results and Need to Start Chemotherapy, PS = 0, 1 Therapeutic Status: Stage IV Metastatic Histology: Nonsquamous Cell Broad Molecular Profiling Status: Awaiting Molecular Test Results and Need to Start Chemotherapy ECOG Performance Status: 1 Intent of Therapy: Non-Curative / Palliative Intent, Not Discussed with Patient

## 2021-03-05 ENCOUNTER — Encounter: Payer: Self-pay | Admitting: Radiology

## 2021-03-05 ENCOUNTER — Observation Stay
Admission: EM | Admit: 2021-03-05 | Discharge: 2021-03-06 | Disposition: A | Discharge status: Home / Self Care | Payer: Medicaid Other | Attending: Student | Admitting: Student

## 2021-03-05 ENCOUNTER — Other Ambulatory Visit: Payer: Self-pay

## 2021-03-05 ENCOUNTER — Emergency Department: Payer: Medicaid Other

## 2021-03-05 ENCOUNTER — Observation Stay: Payer: Medicaid Other

## 2021-03-05 DIAGNOSIS — Z87891 Personal history of nicotine dependence: Secondary | ICD-10-CM | POA: Insufficient documentation

## 2021-03-05 DIAGNOSIS — C7931 Secondary malignant neoplasm of brain: Secondary | ICD-10-CM | POA: Diagnosis not present

## 2021-03-05 DIAGNOSIS — Z79899 Other long term (current) drug therapy: Secondary | ICD-10-CM | POA: Diagnosis not present

## 2021-03-05 DIAGNOSIS — R0902 Hypoxemia: Secondary | ICD-10-CM

## 2021-03-05 DIAGNOSIS — J9601 Acute respiratory failure with hypoxia: Secondary | ICD-10-CM | POA: Diagnosis not present

## 2021-03-05 DIAGNOSIS — J441 Chronic obstructive pulmonary disease with (acute) exacerbation: Secondary | ICD-10-CM | POA: Insufficient documentation

## 2021-03-05 DIAGNOSIS — C3490 Malignant neoplasm of unspecified part of unspecified bronchus or lung: Secondary | ICD-10-CM | POA: Diagnosis not present

## 2021-03-05 DIAGNOSIS — I10 Essential (primary) hypertension: Secondary | ICD-10-CM | POA: Insufficient documentation

## 2021-03-05 DIAGNOSIS — R0602 Shortness of breath: Secondary | ICD-10-CM | POA: Diagnosis present

## 2021-03-05 DIAGNOSIS — Z20822 Contact with and (suspected) exposure to covid-19: Secondary | ICD-10-CM | POA: Insufficient documentation

## 2021-03-05 LAB — CBC WITH DIFFERENTIAL/PLATELET
Abs Immature Granulocytes: 0.32 10*3/uL — ABNORMAL HIGH (ref 0.00–0.07)
Basophils Absolute: 0 10*3/uL (ref 0.0–0.1)
Basophils Relative: 0 %
Eosinophils Absolute: 0.6 10*3/uL — ABNORMAL HIGH (ref 0.0–0.5)
Eosinophils Relative: 3 %
HCT: 29 % — ABNORMAL LOW (ref 36.0–46.0)
Hemoglobin: 9.5 g/dL — ABNORMAL LOW (ref 12.0–15.0)
Immature Granulocytes: 2 %
Lymphocytes Relative: 10 %
Lymphs Abs: 2 10*3/uL (ref 0.7–4.0)
MCH: 28.4 pg (ref 26.0–34.0)
MCHC: 32.8 g/dL (ref 30.0–36.0)
MCV: 86.8 fL (ref 80.0–100.0)
Monocytes Absolute: 1.3 10*3/uL — ABNORMAL HIGH (ref 0.1–1.0)
Monocytes Relative: 7 %
Neutro Abs: 15.5 10*3/uL — ABNORMAL HIGH (ref 1.7–7.7)
Neutrophils Relative %: 78 %
Platelets: 214 10*3/uL (ref 150–400)
RBC: 3.34 MIL/uL — ABNORMAL LOW (ref 3.87–5.11)
RDW: 15.6 % — ABNORMAL HIGH (ref 11.5–15.5)
WBC: 19.8 10*3/uL — ABNORMAL HIGH (ref 4.0–10.5)
nRBC: 0 % (ref 0.0–0.2)

## 2021-03-05 LAB — COMPREHENSIVE METABOLIC PANEL
ALT: 32 U/L (ref 0–44)
AST: 23 U/L (ref 15–41)
Albumin: 2.9 g/dL — ABNORMAL LOW (ref 3.5–5.0)
Alkaline Phosphatase: 290 U/L — ABNORMAL HIGH (ref 38–126)
Anion gap: 10 (ref 5–15)
BUN: 32 mg/dL — ABNORMAL HIGH (ref 6–20)
CO2: 25 mmol/L (ref 22–32)
Calcium: 8 mg/dL — ABNORMAL LOW (ref 8.9–10.3)
Chloride: 105 mmol/L (ref 98–111)
Creatinine, Ser: 0.77 mg/dL (ref 0.44–1.00)
GFR, Estimated: >60 mL/min (ref >60)
Glucose, Bld: 124 mg/dL — ABNORMAL HIGH (ref 70–99)
Potassium: 3.5 mmol/L (ref 3.5–5.1)
Sodium: 140 mmol/L (ref 135–145)
Total Bilirubin: 0.6 mg/dL (ref 0.3–1.2)
Total Protein: 6.1 g/dL — ABNORMAL LOW (ref 6.5–8.1)

## 2021-03-05 LAB — RESP PANEL BY RT-PCR (FLU A&B, COVID) ARPGX2
Influenza A by PCR: NEGATIVE
Influenza B by PCR: NEGATIVE
SARS Coronavirus 2 by RT PCR: NEGATIVE

## 2021-03-05 LAB — BRAIN NATRIURETIC PEPTIDE: B Natriuretic Peptide: 92.2 pg/mL (ref 0.0–100.0)

## 2021-03-05 LAB — TROPONIN I (HIGH SENSITIVITY): Troponin I (High Sensitivity): 22 ng/L — ABNORMAL HIGH (ref <18)

## 2021-03-05 MED ORDER — FOLIC ACID 1 MG PO TABS
1.0000 mg | ORAL_TABLET | Freq: Every day | ORAL | Status: DC
  Administered 2021-03-06: 1 mg via ORAL
  Filled 2021-03-05: qty 1

## 2021-03-05 MED ORDER — MOMETASONE FURO-FORMOTEROL FUM 100-5 MCG/ACT IN AERO
2.0000 | INHALATION_SPRAY | Freq: Two times a day (BID) | RESPIRATORY_TRACT | Status: DC
  Filled 2021-03-05: qty 8.8

## 2021-03-05 MED ORDER — GABAPENTIN 300 MG PO CAPS
300.0000 mg | ORAL_CAPSULE | Freq: Every day | ORAL | Status: DC
  Administered 2021-03-06: 09:00:00 300 mg via ORAL
  Filled 2021-03-05: qty 1

## 2021-03-05 MED ORDER — GUAIFENESIN ER 600 MG PO TB12
1200.0000 mg | ORAL_TABLET | Freq: Two times a day (BID) | ORAL | Status: DC
  Administered 2021-03-06 (×2): 1200 mg via ORAL
  Filled 2021-03-05 (×2): qty 2

## 2021-03-05 MED ORDER — HYDROXYZINE HCL 25 MG PO TABS
25.0000 mg | ORAL_TABLET | Freq: Three times a day (TID) | ORAL | Status: DC | PRN
  Filled 2021-03-05: qty 1

## 2021-03-05 MED ORDER — PANTOPRAZOLE SODIUM 40 MG PO TBEC
40.0000 mg | DELAYED_RELEASE_TABLET | Freq: Every day | ORAL | Status: DC
  Administered 2021-03-06: 40 mg via ORAL
  Filled 2021-03-05: qty 1

## 2021-03-05 MED ORDER — QUETIAPINE FUMARATE 200 MG PO TABS
400.0000 mg | ORAL_TABLET | Freq: Every day | ORAL | Status: DC
  Filled 2021-03-05 (×2): qty 2

## 2021-03-05 MED ORDER — LEVETIRACETAM 500 MG PO TABS
500.0000 mg | ORAL_TABLET | Freq: Two times a day (BID) | ORAL | Status: DC
  Filled 2021-03-05: qty 1

## 2021-03-05 MED ORDER — IPRATROPIUM-ALBUTEROL 0.5-2.5 (3) MG/3ML IN SOLN
3.0000 mL | Freq: Once | RESPIRATORY_TRACT | Status: AC
  Administered 2021-03-05: 3 mL via RESPIRATORY_TRACT
  Filled 2021-03-05: qty 3

## 2021-03-05 MED ORDER — DEXAMETHASONE 6 MG PO TABS
6.0000 mg | ORAL_TABLET | Freq: Three times a day (TID) | ORAL | Status: DC
  Administered 2021-03-06: 08:00:00 6 mg via ORAL
  Filled 2021-03-05 (×2): qty 1

## 2021-03-05 MED ORDER — ONDANSETRON HCL 4 MG PO TABS: 8.0000 mg | ORAL_TABLET | Freq: Two times a day (BID) | ORAL | Status: DC | PRN

## 2021-03-05 MED ORDER — IPRATROPIUM-ALBUTEROL 0.5-2.5 (3) MG/3ML IN SOLN
3.0000 mL | Freq: Four times a day (QID) | RESPIRATORY_TRACT | Status: DC
  Administered 2021-03-06 (×2): 3 mL via RESPIRATORY_TRACT
  Filled 2021-03-05 (×2): qty 3

## 2021-03-05 MED ORDER — FENTANYL 50 MCG/HR TD PT72
1.0000 | MEDICATED_PATCH | TRANSDERMAL | Status: DC
Start: 2021-03-06 — End: 2021-03-06
  Administered 2021-03-06: 1 via TRANSDERMAL
  Filled 2021-03-05: qty 1

## 2021-03-05 MED ORDER — ENSURE ENLIVE PO LIQD
237.0000 mL | Freq: Three times a day (TID) | ORAL | Status: DC
  Administered 2021-03-06: 10:00:00 237 mL via ORAL

## 2021-03-05 MED ORDER — ALBUTEROL SULFATE (2.5 MG/3ML) 0.083% IN NEBU: 2.5000 mg | INHALATION_SOLUTION | RESPIRATORY_TRACT | Status: DC | PRN

## 2021-03-05 MED ORDER — METOPROLOL TARTRATE 25 MG PO TABS
12.5000 mg | ORAL_TABLET | Freq: Two times a day (BID) | ORAL | Status: DC
  Administered 2021-03-06 (×2): 12.5 mg via ORAL
  Filled 2021-03-05 (×2): qty 1

## 2021-03-05 MED ORDER — OXYCODONE-ACETAMINOPHEN 5-325 MG PO TABS
1.0000 | ORAL_TABLET | ORAL | Status: DC | PRN
  Administered 2021-03-06: 10:00:00 1 via ORAL
  Filled 2021-03-05: qty 1

## 2021-03-05 MED ORDER — ONDANSETRON HCL 4 MG PO TABS: 4.0000 mg | ORAL_TABLET | Freq: Four times a day (QID) | ORAL | Status: DC | PRN

## 2021-03-05 MED ORDER — ONDANSETRON HCL 4 MG/2ML IJ SOLN: 4.0000 mg | Freq: Four times a day (QID) | INTRAMUSCULAR | Status: DC | PRN

## 2021-03-05 MED ORDER — DEXAMETHASONE SODIUM PHOSPHATE 10 MG/ML IJ SOLN
10.0000 mg | Freq: Once | INTRAMUSCULAR | Status: AC
  Administered 2021-03-05: 10 mg via INTRAVENOUS
  Filled 2021-03-05: qty 1

## 2021-03-05 MED ORDER — IOHEXOL 300 MG/ML  SOLN
75.0000 mL | Freq: Once | INTRAMUSCULAR | Status: AC | PRN
  Administered 2021-03-05: 75 mL via INTRAVENOUS

## 2021-03-05 MED ORDER — BUSPIRONE HCL 10 MG PO TABS
10.0000 mg | ORAL_TABLET | Freq: Two times a day (BID) | ORAL | Status: DC
  Filled 2021-03-05: qty 1

## 2021-03-05 MED ORDER — POLYETHYLENE GLYCOL 3350 17 G PO PACK
17.0000 g | PACK | Freq: Every day | ORAL | Status: DC
  Filled 2021-03-05: qty 1

## 2021-03-05 NOTE — ED Notes (Signed)
Dr Thomas at bedside

## 2021-03-05 NOTE — ED Notes (Addendum)
Fentanyl patch in place on arrival removed by this RN with Dr. Marcello Moores at bedside. Patch discarded in waste container.

## 2021-03-05 NOTE — H&P (Addendum)
History and Physical    Brenda Middleton WUJ:[id number] DOB: October 17, 1961 DOA: 03/05/2021  PCP: Remi Haggard, FNP  Patient coming from: home  I have personally briefly reviewed patient's old medical records in [place]  Chief Complaint: increase sob  HPI: Brenda Middleton is a 60 y.o. female with medical history significant of recently diagnose lung cancer with mets to the brain (2/22), hypertension, COPD, GERD, depression with anxiety, anemia, HCV, who presents with shortness breath. Patient of note has recent interim history of discharge  03/02/21 after 3 days hospitalization for HCAP as well as COPD exacerbation. Per patient she completed course of discharge antibiotics today. She notes that although on discharge she did feel somewhat improved she was not back to baseline. She states at home her sob returned and progressed with today noting significant worsening. She notes no chest pain,fever  but has had increase sob, and feeling of presyncope. Due to the severity of her symptoms EMS was called.  She was noted on arrival to ed to have sat of 89% on ra with decrease in sat to movement. She was placed onf 2L with stabilization of saturations to 94%. She notes no n/v/d/dysuria or abdominal  Pain, she states she have been able to tolerate po without difficulty.  ED Course:  In ED Afeb, bp 123/66, hr 107, rr 24, sat 94% on ra with movement drop to 87%  Labs: wbc 19.8 down from prior 24,  hgb 9.5 at base, plt 214 NA 140, K 3.5, Cl105,  Cr 0.77, alkphos 290 trending down CE22 BNP22 Respiratory panel: neg tx dounebs x3  Cxr:  IMPRESSION: Patchy bilateral airspace disease again noted, not significantly changed.  Review of Systems: As per HPI otherwise 10 point review of systems negative.   Past Medical History:  Diagnosis Date  . Anxiety   . Benign neoplasm of cervix uteri   . Chronic hepatitis C without mention of hepatic coma   . Chronic low back pain 08/26/2014  .  Constipation   . Depressive disorder   . Displacement of cervical intervertebral disc   . Hypertensive disorder   . Iron deficiency anemia due to chronic blood loss 09/12/2019  . Palpitations   . Prolapsed cervical intervertebral disc     Past Surgical History:  Procedure Laterality Date  . CONIZATION CERVIX    . DILATION AND CURETTAGE OF UTERUS    . HAND SURGERY  2008,2015   Carpel Tunnel  . TONSILLECTOMY    . VULVECTOMY       reports that she has quit smoking. Her smoking use included cigarettes. She has a 24.50 pack-year smoking history. She has never used smokeless tobacco. She reports current alcohol use. She reports that she does not use drugs.  Allergies  Allergen Reactions  . Penicillin G Hives    Other reaction(s): HIVES Other reaction(s): HIVES  . Penicillin V Itching  . Penicillins     Family History  Problem Relation Age of Onset  . Breast cancer Mother 56  . Lung cancer Father   . Cancer Brother   . Cancer Other   . Stroke Other    Prior to Admission medications   Medication Sig Start Date End Date Taking? Authorizing Provider  albuterol (VENTOLIN HFA) 108 (90 Base) MCG/ACT inhaler Inhale 2 puffs into the lungs every 4 (four) hours as needed for wheezing or shortness of breath. 02/21/21   Danford, Suann Larry, MD  budesonide-formoterol (SYMBICORT) 80-4.5 MCG/ACT inhaler Inhale 2 puffs into  the lungs 2 (two) times daily.    [provider]  busPIRone (BUSPAR) 10 MG tablet Take 10 mg by mouth 2 (two) times daily. Patient is unsure of dose    [provider]  cefUROXime (CEFTIN) 250 MG tablet Take 1 tablet (250 mg total) by mouth 2 (two) times daily with a meal. 03/02/21   Darrick Meigs, Marge Duncans, MD  dexamethasone (DECADRON) 4 MG tablet Take 1 tablet (4 mg total) by mouth 2 (two) times daily. 03/02/21   Oswald Hillock, MD  doxycycline (VIBRA-TABS) 100 MG tablet Take 1 tablet (100 mg total) by mouth 2 (two) times daily. 03/02/21   Oswald Hillock, MD  feeding  supplement (ENSURE ENLIVE / ENSURE PLUS) LIQD Take 237 mLs by mouth 3 (three) times daily between meals. 02/21/21   Danford, Suann Larry, MD  fentaNYL (DURAGESIC) 50 MCG/HR Place 1 patch onto the skin every 3 (three) days for 6 days. 03/02/21 03/08/21  Oswald Hillock, MD  folic acid (FOLVITE) 1 MG tablet Take 1 tablet (1 mg total) by mouth daily. Start 5-7 days before Alimta chemotherapy. Continue until 21 days after Alimta completed. 03/04/21   Earlie Server, MD  gabapentin (NEURONTIN) 300 MG capsule Take 300 mg by mouth daily. 02/04/21   [provider]  guaiFENesin (MUCINEX) 600 MG 12 hr tablet Take 2 tablets (1,200 mg total) by mouth 2 (two) times daily. 03/02/21   Oswald Hillock, MD  hydrOXYzine (ATARAX/VISTARIL) 25 MG tablet hydroxyzine HCl 25 mg tablet    [provider]  levETIRAcetam (KEPPRA) 500 MG tablet Take 1 tablet (500 mg total) by mouth 2 (two) times daily. 03/02/21   Oswald Hillock, MD  lidocaine-prilocaine (EMLA) cream Apply to affected area once 03/04/21   Earlie Server, MD  metoprolol tartrate (LOPRESSOR) 25 MG tablet Take 0.5 tablets (12.5 mg total) by mouth 2 (two) times daily. 03/02/21 06/30/21  Oswald Hillock, MD  ondansetron (ZOFRAN) 8 MG tablet Take 1 tablet (8 mg total) by mouth 2 (two) times daily as needed (Nausea or vomiting). Start if needed on the third day after cisplatin. 03/04/21   Earlie Server, MD  ondansetron (ZOFRAN-ODT) 4 MG disintegrating tablet Take 1 tablet (4 mg total) by mouth every 8 (eight) hours as needed. Patient not taking: No sig reported 02/21/21   Edwin Dada, MD  oxyCODONE-acetaminophen (PERCOCET/ROXICET) 5-325 MG tablet Take 1-2 tablets by mouth every 4 (four) hours as needed for severe pain. 02/25/21   Borders, Kirt Boys, NP  pantoprazole (PROTONIX) 40 MG tablet Take 40 mg by mouth daily. 02/08/21   [provider]  prochlorperazine (COMPAZINE) 10 MG tablet Take 10 mg by mouth every 6 (six) hours as needed for nausea or vomiting.    [provider]  prochlorperazine (COMPAZINE) 10 MG tablet Take 1 tablet (10 mg total) by mouth every 6 (six) hours as needed (Nausea or vomiting). 03/04/21   Earlie Server, MD  promethazine (PHENERGAN) 12.5 MG tablet Take 1 tablet (12.5 mg total) by mouth every 6 (six) hours as needed for nausea or vomiting. 02/17/21   Blake Divine, MD  QUEtiapine (SEROQUEL) 400 MG tablet Take 400 mg by mouth at bedtime.    [provider]  amitriptyline (ELAVIL) 25 MG tablet Take 2 tablets (50 mg total) by mouth at bedtime. Patient not taking: No sig reported 08/26/14 01/01/21  Kathrynn Ducking, MD  clonazePAM (KLONOPIN) 0.5 MG tablet Take 0.5 mg by mouth 2 (two) times daily.  01/01/21  [provider]  temazepam (RESTORIL) 30 MG capsule Take 30 mg by mouth at bedtime.  01/01/21  [provider]    Physical Exam: Vitals:   03/05/21 2107 03/05/21 2108 03/05/21 2114  BP: 123/66    Pulse: (!) 107    Resp: (!) 24    Temp:   97.8 F (36.6 C)  TempSrc:   Oral  SpO2: 94%    Weight:  51.8 kg   Height:  5\' 6"  (1.676 m)      Vitals:   03/05/21 2107 03/05/21 2108 03/05/21 2114  BP: 123/66    Pulse: (!) 107    Resp: (!) 24    Temp:   97.8 F (36.6 C)  TempSrc:   Oral  SpO2: 94%    Weight:  51.8 kg   Height:  5\' 6"  (1.676 m)   Constitutional:ill appearing , lethargic but easily awakens, mild conversation doe Eyes: PERRL, lids and conjunctivae normal ENMT: Mucous membranes are dry. Posterior pharynx clear of any exudate or lesions Neck: normal, supple, no masses, no thyromegaly Respiratory: diffuse wheeze and rhonchi , normal respiratory effort. No accessory muscle use.  Cardiovascular: Reg rhythm, tachycardic, no murmurs / rubs / gallops. No extremity edema. 2+ pedal pulses. No carotid bruits.  Abdomen: no tenderness, no masses palpated. No hepatosplenomegaly. Bowel sounds positive.  Musculoskeletal: no clubbing / cyanosis. No joint deformity upper and lower extremities. Good ROM, no  contractures. Normal muscle tone.  Skin: no rashes, lesions, ulcers. No induration Neurologic: CN 2-12 grossly intact. Sensation intact Strength 5/5 in all 4.  Psychiatric: Normal judgment and insight. Alert and oriented x 3. Normal mood.    Labs on Admission: I have personally reviewed following labs and imaging studies  CBC: Recent Labs  Lab 02/27/21 0819 02/28/21 0322 03/01/21 1252 03/01/21 2059 03/02/21 0551 03/02/21 1349 03/05/21 2143  WBC 23.6*   < > 19.5* 25.1* 19.8* 24.0* 19.8*  NEUTROABS 20.5*  --   --   --   --   --  15.5*  HGB 10.7*   < > 9.6* 10.0* 8.7* 9.2* 9.5*  HCT 32.4*   < > 28.9* 29.9* 26.6* 28.8* 29.0*  MCV 86.6   < > 87.6 87.2 87.2 90.3 86.8  PLT 241   < > 235 292 231 244 214   < > = values in this interval not displayed.   Basic Metabolic Panel: Recent Labs  Lab 02/27/21 0819 02/28/21 0322 03/01/21 0520 03/02/21 0551 03/05/21 2143  NA 137 140 139 138 140  K 3.8 3.7 4.1 4.0 3.5  CL 102 104 109 110 105  CO2 25 28 24  21* 25  GLUCOSE 164* 108* 129* 124* 124*  BUN 21* 19 23* 24* 32*  CREATININE 0.68 0.59 0.70 0.62 0.77  CALCIUM 8.3* 8.4* 8.5* 8.4* 8.0*  MG 2.0  --   --   --   --    GFR: Estimated Creatinine Clearance: 61.2 mL/min (by C-G formula based on SCr of 0.77 mg/dL). Liver Function Tests: Recent Labs  Lab 02/27/21 0819 03/05/21 2143  AST 29 23  ALT 32 32  ALKPHOS 312* 290*  BILITOT 0.5 0.6  PROT 6.8 6.1*  ALBUMIN 3.3* 2.9*   No results for input(s): LIPASE, AMYLASE in the last 168 hours. Recent Labs  Lab 02/27/21 1243  AMMONIA 17   Coagulation Profile: No results for input(s): INR, PROTIME in the last 168 hours. Cardiac Enzymes: No results for input(s): CKTOTAL, CKMB, CKMBINDEX, TROPONINI in  the last 168 hours. BNP (last 3 results) No results for input(s): PROBNP in the last 8760 hours. HbA1C: No results for input(s): HGBA1C in the last 72 hours. CBG: Recent Labs  Lab 02/28/21 0813  GLUCAP 100*   Lipid Profile: No  results for input(s): CHOL, HDL, LDLCALC, TRIG, CHOLHDL, LDLDIRECT in the last 72 hours. Thyroid Function Tests: No results for input(s): TSH, T4TOTAL, FREET4, T3FREE, THYROIDAB in the last 72 hours. Anemia Panel: No results for input(s): VITAMINB12, FOLATE, FERRITIN, TIBC, IRON, RETICCTPCT in the last 72 hours. Urine analysis:    Component Value Date/Time   COLORURINE YELLOW (A) 02/18/2021 0924   APPEARANCEUR HAZY (A) 02/18/2021 0924   APPEARANCEUR Clear 11/04/2013 1649   LABSPEC 1.015 02/18/2021 0924   LABSPEC 1.009 11/04/2013 1649   PHURINE 6.0 02/18/2021 0924   GLUCOSEU NEGATIVE 02/18/2021 0924   GLUCOSEU Negative 11/04/2013 1649   HGBUR SMALL (A) 02/18/2021 0924   BILIRUBINUR NEGATIVE 02/18/2021 0924   BILIRUBINUR Negative 11/04/2013 1649   KETONESUR NEGATIVE 02/18/2021 0924   PROTEINUR NEGATIVE 02/18/2021 0924   NITRITE NEGATIVE 02/18/2021 0924   LEUKOCYTESUR NEGATIVE 02/18/2021 0924   LEUKOCYTESUR Negative 11/04/2013 1649    Radiological Exams on Admission: DG Chest 2 View  Result Date: 03/05/2021 CLINICAL DATA:  Shortness of breath EXAM: CHEST - 2 VIEW COMPARISON:  02/27/2021 FINDINGS: Heart is normal size. Right upper lobe mass again noted as seen on CT. Patchy bilateral airspace disease again noted, similar to prior study. No effusions or acute bony abnormality. IMPRESSION: Patchy bilateral airspace disease again noted, not significantly changed. Right upper lobe mass again seen. Electronically Signed   By: Rolm Baptise M.D.   On: 03/05/2021 21:42    EKG: Independently reviewed.  Assessment/Plan Acute hypoxic respiratory failure due to AECOPD in background of lung ca and recent HCAP  -supportive O2 and wean as able currently on 2l with sat mid 90's -will need home O2 eval prior to d/c   Acute COPD exacerbation  -diffuse wheezing on exam  -nebs/ pulmonary toilet  -increase decadron, taper back to base 4mg  po bid once symptoms resolved -abg pending   HCAP   POA -cxr unchanged  -wbc count decreased from prior even in setting of steroid use  -course of antibiotics completed 4/2  -note no fever -to be complete will repeat inflammatory markers   -if patient does spike fever or ct scan notes progression will start empiric antibiotics  Lung CA with mets to brain  -plan for radiation /as well as chemo -tentative date for chemo 4/11 -Patient follow with  Dr. Tasia Catchings -taper to  Decadron 4 mg twice a day once COPD flare resolved -continue on Keppra for  sz  Precautions   GERD -Protonix   Pathologic compression fx of lumbar spine -Continue home fentanyl patch and as needed Percocet  HTN (hypertension)  HTN -resume home regimen  -bp currently stable   Depression/anxiety  -Continue home BuSpar, hydroxyzine, Seroquel  Anemia  -Hbg stable -monitor labs    Elevated Cardiac Enzyme -decreased from previous hospitalization  -thought due to demand     DVT prophylaxis: scd Code Status:DNR Family Communication:  N/a Disposition Plan: patient  expected to be admitted less than 2 midnights Consults called: N/A Admission status: observation    Clance Boll MD [facility]  If 7PM-7AM, please contact night-coverage [email or web address] Password [facility]  03/05/2021, 10:48 PM

## 2021-03-05 NOTE — ED Notes (Signed)
2nd INT attempted by this RN. Unsuccessful. IV team to be consulted for further IV attempts.

## 2021-03-05 NOTE — ED Triage Notes (Signed)
EMS brought in from home for increased SOB today. Recently diagnosed with PNA, completed ABx treatment today. Diagnosed with metastatic lung cancer February 2022. Denies fever/CP. Does not wear O2 at baseline. Pitting edema BLE, has been taking lasix for same.

## 2021-03-05 NOTE — ED Provider Notes (Signed)
[facility] Emergency Department Provider Note  ____________________________________________   Event Date/Time   First MD Initiated Contact with Patient 03/05/21 2103     (approximate)  I have reviewed the triage vital signs and the nursing notes.   HISTORY  Chief Complaint Shortness of Breath    HPI Brenda Middleton is a 60 y.o. female with history of recently diagnosed metastatic lung cancer here with shortness of breath.  The patient was just discharged 2 days ago following admission for pneumonia.  She states she had been improving.  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  She states that her shortness of breath has persisted.  She got very anxious because she felt like she could not breathe.  Denies any chest pain.  She has had increasing cough today.  No fevers.  No leg swelling.  She has a headache secondary to known brain mets, but states this is not any worse than it has been.  No drowsiness.  No sputum production.  No hemoptysis.  No leg swelling.        Past Medical History:  Diagnosis Date  . Anxiety   . Benign neoplasm of cervix uteri   . Chronic hepatitis C without mention of hepatic coma   . Chronic low back pain 08/26/2014  . Constipation   . Depressive disorder   . Displacement of cervical intervertebral disc   . Hypertensive disorder   . Iron deficiency anemia due to chronic blood loss 09/12/2019  . Palpitations   . Prolapsed cervical intervertebral disc     Patient Active Problem List   Diagnosis Date Noted  . HCAP (healthcare-associated pneumonia) 02/27/2021  . HTN (hypertension) 02/27/2021  . Depression with anxiety 02/27/2021  . Normocytic anemia 02/27/2021  . Sepsis ([place]) 02/27/2021  . Protein-calorie malnutrition, moderate ([place]) 02/27/2021  . Primary malignant neoplasm of lung metastatic to other site ([place]) 02/27/2021  . Elevated  troponin 02/27/2021  . Acute metabolic encephalopathy 14/43/1540  . Brain metastases ([place]) 02/27/2021  . Shortness of breath   . Metastatic malignant neoplasm ([place])   . Intractable low back pain 02/18/2021  . Anxiety   . Depressive disorder   . Pathologic fracture of lumbar vertebra, initial encounter   . 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 COPD exacerbation ([place])   . GERD (gastroesophageal reflux disease)   . Palliative care encounter   . Pathologic compression fracture of lumbar vertebra ([place])   . Goals of care, counseling/discussion   . Neoplasm related pain   . Iron deficiency anemia due to chronic blood loss 09/12/2019  . Family history of cancer 09/12/2019  . Blood in the stool 09/12/2019  . Elevated alkaline phosphatase level 09/12/2019  . Chronic low back pain 08/26/2014    Past Surgical History:  Procedure Laterality Date  . CONIZATION CERVIX    . DILATION AND CURETTAGE OF UTERUS    . HAND SURGERY  2008,2015   Carpel Tunnel  . TONSILLECTOMY    . VULVECTOMY      Prior to Admission medications   Medication Sig Start Date End Date Taking? Authorizing Provider  albuterol (VENTOLIN HFA) 108 (90 Base) MCG/ACT inhaler Inhale 2 puffs into the lungs every 4 (four) hours as needed for wheezing or shortness of breath. 02/21/21   Danford, Suann Larry, MD  budesonide-formoterol (SYMBICORT) 80-4.5 MCG/ACT inhaler Inhale 2 puffs into the lungs  2 (two) times daily.    [provider]  busPIRone (BUSPAR) 10 MG tablet Take 10 mg by mouth 2 (two) times daily. Patient is unsure of dose    [provider]  cefUROXime (CEFTIN) 250 MG tablet Take 1 tablet (250 mg total) by mouth 2 (two) times daily with a meal. 03/02/21   Darrick Meigs, Marge Duncans, MD  dexamethasone (DECADRON) 4 MG tablet Take 1 tablet (4 mg total) by mouth 2 (two) times daily. 03/02/21   Oswald Hillock, MD  doxycycline (VIBRA-TABS) 100 MG tablet Take 1 tablet (100 mg total) by mouth 2 (two) times daily. 03/02/21    Oswald Hillock, MD  feeding supplement (ENSURE ENLIVE / ENSURE PLUS) LIQD Take 237 mLs by mouth 3 (three) times daily between meals. 02/21/21   Danford, Suann Larry, MD  fentaNYL (DURAGESIC) 50 MCG/HR Place 1 patch onto the skin every 3 (three) days for 6 days. 03/02/21 03/08/21  Oswald Hillock, MD  folic acid (FOLVITE) 1 MG tablet Take 1 tablet (1 mg total) by mouth daily. Start 5-7 days before Alimta chemotherapy. Continue until 21 days after Alimta completed. 03/04/21   Earlie Server, MD  gabapentin (NEURONTIN) 300 MG capsule Take 300 mg by mouth daily. 02/04/21   [provider]  guaiFENesin (MUCINEX) 600 MG 12 hr tablet Take 2 tablets (1,200 mg total) by mouth 2 (two) times daily. 03/02/21   Oswald Hillock, MD  hydrOXYzine (ATARAX/VISTARIL) 25 MG tablet hydroxyzine HCl 25 mg tablet    [provider]  levETIRAcetam (KEPPRA) 500 MG tablet Take 1 tablet (500 mg total) by mouth 2 (two) times daily. 03/02/21   Oswald Hillock, MD  lidocaine-prilocaine (EMLA) cream Apply to affected area once 03/04/21   Earlie Server, MD  metoprolol tartrate (LOPRESSOR) 25 MG tablet Take 0.5 tablets (12.5 mg total) by mouth 2 (two) times daily. 03/02/21 06/30/21  Oswald Hillock, MD  ondansetron (ZOFRAN) 8 MG tablet Take 1 tablet (8 mg total) by mouth 2 (two) times daily as needed (Nausea or vomiting). Start if needed on the third day after cisplatin. 03/04/21   Earlie Server, MD  ondansetron (ZOFRAN-ODT) 4 MG disintegrating tablet Take 1 tablet (4 mg total) by mouth every 8 (eight) hours as needed. Patient not taking: No sig reported 02/21/21   Edwin Dada, MD  oxyCODONE-acetaminophen (PERCOCET/ROXICET) 5-325 MG tablet Take 1-2 tablets by mouth every 4 (four) hours as needed for severe pain. 02/25/21   Borders, Kirt Boys, NP  pantoprazole (PROTONIX) 40 MG tablet Take 40 mg by mouth daily. 02/08/21   [provider]  prochlorperazine (COMPAZINE) 10 MG tablet Take 10 mg by mouth every 6 (six) hours as needed for nausea or  vomiting.    [provider]  prochlorperazine (COMPAZINE) 10 MG tablet Take 1 tablet (10 mg total) by mouth every 6 (six) hours as needed (Nausea or vomiting). 03/04/21   Earlie Server, MD  promethazine (PHENERGAN) 12.5 MG tablet Take 1 tablet (12.5 mg total) by mouth every 6 (six) hours as needed for nausea or vomiting. 02/17/21   Blake Divine, MD  QUEtiapine (SEROQUEL) 400 MG tablet Take 400 mg by mouth at bedtime.    [provider]  amitriptyline (ELAVIL) 25 MG tablet Take 2 tablets (50 mg total) by mouth at bedtime. Patient not taking: No sig reported 08/26/14 01/01/21  Kathrynn Ducking, MD  clonazePAM (KLONOPIN) 0.5 MG tablet Take 0.5 mg by mouth 2 (two) times daily.  01/01/21  [provider]  temazepam (RESTORIL) 30 MG capsule Take 30 mg by mouth at bedtime.  01/01/21  [provider]    Allergies Penicillin g, Penicillin v, and Penicillins  Family History  Problem Relation Age of Onset  . Breast cancer Mother 13  . Lung cancer Father   . Cancer Brother   . Cancer Other   . Stroke Other     Social History Social History   Tobacco Use  . Smoking status: Former Smoker    Packs/day: 0.70    Years: 35.00    Pack years: 24.50    Types: Cigarettes  . Smokeless tobacco: Never Used  Vaping Use  . Vaping Use: Never used  Substance Use Topics  . Alcohol use: Yes    Comment: occasional wine  . Drug use: No    Review of Systems  Review of Systems  Constitutional: Positive for fatigue. Negative for fever.  HENT: Negative for congestion and sore throat.   Eyes: Negative for visual disturbance.  Respiratory: Positive for cough, shortness of breath and wheezing.   Cardiovascular: Negative for chest pain.  Gastrointestinal: Negative for abdominal pain, diarrhea, nausea and vomiting.  Genitourinary: Negative for flank pain.  Musculoskeletal: Negative for back pain and neck pain.  Skin: Negative for rash and wound.  Neurological: Positive for  weakness.  All other systems reviewed and are negative.    ____________________________________________  PHYSICAL EXAM:      VITAL SIGNS: ED Triage Vitals  Enc Vitals Group     BP 03/05/21 2107 123/66     Pulse Rate 03/05/21 2107 (!) 107     Resp 03/05/21 2107 (!) 24     Temp --      Temp src --      SpO2 03/05/21 2107 94 %     Weight 03/05/21 2108 114 lb 3.2 oz (51.8 kg)     Height 03/05/21 2108 5\' 6"  (1.676 m)     Head Circumference --      Peak Flow --      Pain Score --      Pain Loc --      Pain Edu? --      Excl. in [place]? --      Physical Exam Vitals and nursing note reviewed.  Constitutional:      General: She is not in acute distress.    Appearance: She is well-developed.  HENT:     Head: Normocephalic and atraumatic.  Eyes:     Conjunctiva/sclera: Conjunctivae normal.  Cardiovascular:     Rate and Rhythm: Regular rhythm. Tachycardia present.     Heart sounds: Normal heart sounds.  Pulmonary:     Effort: Pulmonary effort is normal. Tachypnea present. No respiratory distress.     Breath sounds: Examination of the right-lower field reveals wheezing. Examination of the left-lower field reveals wheezing. Decreased breath sounds and wheezing present.  Abdominal:     General: There is no distension.  Musculoskeletal:     Cervical back: Neck supple.  Skin:    General: Skin is warm.     Capillary Refill: Capillary refill takes less than 2 seconds.     Findings: No rash.  Neurological:     Mental Status: She is alert and oriented to person, place, and time.     Motor: No abnormal muscle tone.       ____________________________________________   LABS (all labs ordered are listed, but only abnormal results are displayed)  Labs Reviewed  CBC  WITH DIFFERENTIAL/PLATELET - Abnormal; Notable for the following components:      Result Value   WBC 19.8 (*)    RBC 3.34 (*)    Hemoglobin 9.5 (*)    HCT 29.0 (*)    RDW 15.6 (*)    Neutro Abs 15.5 (*)     Monocytes Absolute 1.3 (*)    Eosinophils Absolute 0.6 (*)    Abs Immature Granulocytes 0.32 (*)    All other components within normal limits  COMPREHENSIVE METABOLIC PANEL - Abnormal; Notable for the following components:   Glucose, Bld 124 (*)    BUN 32 (*)    Calcium 8.0 (*)    Total Protein 6.1 (*)    Albumin 2.9 (*)    Alkaline Phosphatase 290 (*)    All other components within normal limits  BLOOD GAS, ARTERIAL - Abnormal; Notable for the following components:   pH, Arterial 7.50 (*)    Bicarbonate 30.4 (*)    Acid-Base Excess 6.7 (*)    All other components within normal limits  TROPONIN I (HIGH SENSITIVITY) - Abnormal; Notable for the following components:   Troponin I (High Sensitivity) 22 (*)    All other components within normal limits  RESP PANEL BY RT-PCR (FLU A&B, COVID) ARPGX2  EXPECTORATED SPUTUM ASSESSMENT W GRAM STAIN, RFLX TO RESP C  BRAIN NATRIURETIC PEPTIDE  D-DIMER, QUANTITATIVE  COMPREHENSIVE METABOLIC PANEL  SEDIMENTATION RATE  C-REACTIVE PROTEIN  AMMONIA  PROCALCITONIN  LACTIC ACID, PLASMA  LACTIC ACID, PLASMA  TROPONIN I (HIGH SENSITIVITY)  TROPONIN I (HIGH SENSITIVITY)    ____________________________________________  EKG: Normal sinus rhythm, tracing 94.  PR 122, QRS 70, QTc 458.  Left atrial enlargement.  No acute ST elevations or depressions. ________________________________________  RADIOLOGY All imaging, including plain films, CT scans, and ultrasounds, independently reviewed by me, and interpretations confirmed via formal radiology reads.  ED MD interpretation:   Chest x-ray: Patchy bilateral airspace disease, unchanged from prior  Official radiology report(s): DG Chest 2 View  Result Date: 03/05/2021 CLINICAL DATA:  Shortness of breath EXAM: CHEST - 2 VIEW COMPARISON:  02/27/2021 FINDINGS: Heart is normal size. Right upper lobe mass again noted as seen on CT. Patchy bilateral airspace disease again noted, similar to prior study. No  effusions or acute bony abnormality. IMPRESSION: Patchy bilateral airspace disease again noted, not significantly changed. Right upper lobe mass again seen. Electronically Signed   By: Rolm Baptise M.D.   On: 03/05/2021 21:42   CT CHEST W CONTRAST  Result Date: 03/06/2021 CLINICAL DATA:  Respiratory failure, shortness of breath EXAM: CT CHEST WITH CONTRAST TECHNIQUE: Multidetector CT imaging of the chest was performed during intravenous contrast administration. CONTRAST:  16mL OMNIPAQUE IOHEXOL 300 MG/ML  SOLN COMPARISON:  02/27/2021.  Chest x-ray 03/05/2021 FINDINGS: Cardiovascular: Heart is normal size. Coronary artery and aortic calcifications. No aneurysm. Mediastinum/Nodes: Bulky mediastinal and bilateral hilar adenopathy again noted, unchanged. Trachea is patent. Thyroid unremarkable. Lungs/Pleura: Ground-glass airspace opacities in the lower lobes have worsened since prior study, particularly in the left lower lobe. Ground-glass opacities in the upper lobes somewhat improved since prior study. Numerous bilateral pulmonary nodules are stable. Right upper lobe mass again noted and stable. No effusions. Upper Abdomen: Imaging into the upper abdomen demonstrates no acute findings. Musculoskeletal: Chest wall soft tissues are unremarkable. Multiple osseous metastases again noted, unchanged. IMPRESSION: Redemonstrated is extensive metastatic disease in the chest with numerous pulmonary nodules, dominant right upper lobe mass, and bulky mediastinal/bilateral hilar adenopathy. Ground-glass opacities have  increased in the lower lobes, left greater than right, likely infectious/inflammatory. Somewhat improvement in ground-glass opacities in the upper lobe since prior study. Stable osseous metastatic disease. Coronary artery disease. Aortic Atherosclerosis (ICD10-I70.0). Electronically Signed   By: Rolm Baptise M.D.   On: 03/06/2021 00:11     ____________________________________________  PROCEDURES   Procedure(s) performed (including Critical Care):  .1-3 Lead EKG Interpretation Performed by: Duffy Bruce, MD Authorized by: Duffy Bruce, MD     Interpretation: normal     ECG rate:  80-90   ECG rate assessment: normal     Rhythm: sinus rhythm     Ectopy: none     Conduction: normal   Comments:     Indication: sob    ____________________________________________  INITIAL IMPRESSION / MDM / ASSESSMENT AND PLAN / ED COURSE  As part of my medical decision making, I reviewed the following data within the [place] notes reviewed and incorporated, Old chart reviewed, Notes from prior ED visits, and [place] Controlled Substance Database       *Brenda Middleton was evaluated in Emergency Department on 03/06/2021 for the symptoms described in the history of present illness. She was evaluated in the context of the global COVID-19 pandemic, which necessitated consideration that the patient might be at risk for infection with the SARS-CoV-2 virus that causes COVID-19. Institutional protocols and algorithms that pertain to the evaluation of patients at risk for COVID-19 are in a state of rapid change based on information released by regulatory bodies including the CDC and federal and state organizations. These policies and algorithms were followed during the patient's care in the ED.  Some ED evaluations and interventions may be delayed as a result of limited staffing during the pandemic.*     Medical Decision Making:  60 yo F here with recurrent SOB. Pt unfortunately has recently diagnosed, metastatic lung CA and recent admissions for HCAP/resp failure. She is moderately hypoxic here w/ exertion, with diffuse wheezing. Suspect underlying COPD exacerbation with exertional hypoxia. Will plan to readmit. CBC shows leukocytosis which is improved from prior. CMP largely at baseline. EKG non  Ischemic. Admit  to medicine.  ____________________________________________  FINAL CLINICAL IMPRESSION(S) / ED DIAGNOSES  Final diagnoses:  Hypoxia  COPD exacerbation ([place])     MEDICATIONS GIVEN DURING THIS VISIT:  Medications  fentaNYL (DURAGESIC) 50 MCG/HR 1 patch (has no administration in time range)  oxyCODONE-acetaminophen (PERCOCET/ROXICET) 5-325 MG per tablet 1-2 tablet (has no administration in time range)  metoprolol tartrate (LOPRESSOR) tablet 12.5 mg (has no administration in time range)  busPIRone (BUSPAR) tablet 10 mg (has no administration in time range)  hydrOXYzine (ATARAX/VISTARIL) tablet 25 mg (has no administration in time range)  QUEtiapine (SEROQUEL) tablet 400 mg (has no administration in time range)  dexamethasone (DECADRON) tablet 6 mg (has no administration in time range)  pantoprazole (PROTONIX) EC tablet 40 mg (has no administration in time range)  folic acid (FOLVITE) tablet 1 mg (has no administration in time range)  gabapentin (NEURONTIN) capsule 300 mg (has no administration in time range)  levETIRAcetam (KEPPRA) tablet 500 mg (has no administration in time range)  feeding supplement (ENSURE ENLIVE / ENSURE PLUS) liquid 237 mL (has no administration in time range)  mometasone-formoterol (DULERA) 100-5 MCG/ACT inhaler 2 puff (has no administration in time range)  guaiFENesin (MUCINEX) 12 hr tablet 1,200 mg (has no administration in time range)  ipratropium-albuterol (DUONEB) 0.5-2.5 (3) MG/3ML nebulizer solution 3 mL (has no administration in time range)  albuterol (PROVENTIL) (2.5 MG/3ML) 0.083% nebulizer solution 2.5 mg (has no administration in time range)  ondansetron (ZOFRAN) tablet 4 mg (has no administration in time range)    Or  ondansetron (ZOFRAN) injection 4 mg (has no administration in time range)  polyethylene glycol (MIRALAX / GLYCOLAX) packet 17 g (has no administration in time range)  ipratropium-albuterol (DUONEB) 0.5-2.5 (3) MG/3ML nebulizer solution  3 mL (3 mLs Nebulization Given 03/05/21 2146)  ipratropium-albuterol (DUONEB) 0.5-2.5 (3) MG/3ML nebulizer solution 3 mL (3 mLs Nebulization Given 03/05/21 2146)  ipratropium-albuterol (DUONEB) 0.5-2.5 (3) MG/3ML nebulizer solution 3 mL (3 mLs Nebulization Given 03/05/21 2146)  dexamethasone (DECADRON) injection 10 mg (10 mg Intravenous Given 03/05/21 2340)  iohexol (OMNIPAQUE) 300 MG/ML solution 75 mL (75 mLs Intravenous Contrast Given 03/05/21 2358)     ED Discharge Orders    None       Note:  This document was prepared using Dragon voice recognition software and may include unintentional dictation errors.   Duffy Bruce, MD 03/06/21 [phone]

## 2021-03-06 ENCOUNTER — Encounter: Payer: Self-pay | Admitting: Internal Medicine

## 2021-03-06 DIAGNOSIS — K219 Gastro-esophageal reflux disease without esophagitis: Secondary | ICD-10-CM | POA: Diagnosis present

## 2021-03-06 DIAGNOSIS — Z5329 Procedure and treatment not carried out because of patient's decision for other reasons: Secondary | ICD-10-CM | POA: Diagnosis present

## 2021-03-06 DIAGNOSIS — M545 Low back pain, unspecified: Secondary | ICD-10-CM | POA: Diagnosis present

## 2021-03-06 DIAGNOSIS — J189 Pneumonia, unspecified organism: Secondary | ICD-10-CM | POA: Diagnosis present

## 2021-03-06 DIAGNOSIS — Z79899 Other long term (current) drug therapy: Secondary | ICD-10-CM

## 2021-03-06 DIAGNOSIS — G8929 Other chronic pain: Secondary | ICD-10-CM | POA: Diagnosis present

## 2021-03-06 DIAGNOSIS — Z20822 Contact with and (suspected) exposure to covid-19: Secondary | ICD-10-CM | POA: Diagnosis present

## 2021-03-06 DIAGNOSIS — Z801 Family history of malignant neoplasm of trachea, bronchus and lung: Secondary | ICD-10-CM

## 2021-03-06 DIAGNOSIS — J441 Chronic obstructive pulmonary disease with (acute) exacerbation: Secondary | ICD-10-CM | POA: Diagnosis not present

## 2021-03-06 DIAGNOSIS — Z880 Allergy status to penicillin: Secondary | ICD-10-CM

## 2021-03-06 DIAGNOSIS — Z87891 Personal history of nicotine dependence: Secondary | ICD-10-CM

## 2021-03-06 DIAGNOSIS — C7931 Secondary malignant neoplasm of brain: Secondary | ICD-10-CM | POA: Diagnosis present

## 2021-03-06 DIAGNOSIS — J440 Chronic obstructive pulmonary disease with acute lower respiratory infection: Secondary | ICD-10-CM | POA: Diagnosis present

## 2021-03-06 DIAGNOSIS — A419 Sepsis, unspecified organism: Principal | ICD-10-CM | POA: Diagnosis present

## 2021-03-06 DIAGNOSIS — Z7951 Long term (current) use of inhaled steroids: Secondary | ICD-10-CM

## 2021-03-06 DIAGNOSIS — I10 Essential (primary) hypertension: Secondary | ICD-10-CM | POA: Diagnosis present

## 2021-03-06 DIAGNOSIS — C3490 Malignant neoplasm of unspecified part of unspecified bronchus or lung: Secondary | ICD-10-CM | POA: Diagnosis present

## 2021-03-06 DIAGNOSIS — J9601 Acute respiratory failure with hypoxia: Secondary | ICD-10-CM | POA: Diagnosis present

## 2021-03-06 DIAGNOSIS — F418 Other specified anxiety disorders: Secondary | ICD-10-CM | POA: Diagnosis present

## 2021-03-06 DIAGNOSIS — Z803 Family history of malignant neoplasm of breast: Secondary | ICD-10-CM

## 2021-03-06 DIAGNOSIS — Y95 Nosocomial condition: Secondary | ICD-10-CM | POA: Diagnosis present

## 2021-03-06 LAB — COMPREHENSIVE METABOLIC PANEL
ALT: 32 U/L (ref 0–44)
AST: 21 U/L (ref 15–41)
Albumin: 2.8 g/dL — ABNORMAL LOW (ref 3.5–5.0)
Alkaline Phosphatase: 294 U/L — ABNORMAL HIGH (ref 38–126)
Anion gap: 9 (ref 5–15)
BUN: 27 mg/dL — ABNORMAL HIGH (ref 6–20)
CO2: 27 mmol/L (ref 22–32)
Calcium: 8.2 mg/dL — ABNORMAL LOW (ref 8.9–10.3)
Chloride: 104 mmol/L (ref 98–111)
Creatinine, Ser: 0.57 mg/dL (ref 0.44–1.00)
GFR, Estimated: >60 mL/min (ref >60)
Glucose, Bld: 151 mg/dL — ABNORMAL HIGH (ref 70–99)
Potassium: 4.4 mmol/L (ref 3.5–5.1)
Sodium: 140 mmol/L (ref 135–145)
Total Bilirubin: 0.6 mg/dL (ref 0.3–1.2)
Total Protein: 6.2 g/dL — ABNORMAL LOW (ref 6.5–8.1)

## 2021-03-06 LAB — BLOOD GAS, ARTERIAL
Acid-Base Excess: 6.7 mmol/L — ABNORMAL HIGH (ref 0.0–2.0)
Bicarbonate: 30.4 mmol/L — ABNORMAL HIGH (ref 20.0–28.0)
FIO2: 0.32
O2 Saturation: 97.2 %
Patient temperature: 37
pCO2 arterial: 39 mmHg (ref 32.0–48.0)
pH, Arterial: 7.5 — ABNORMAL HIGH (ref 7.350–7.450)
pO2, Arterial: 84 mmHg (ref 83.0–108.0)

## 2021-03-06 LAB — LACTIC ACID, PLASMA: Lactic Acid, Venous: 1.6 mmol/L (ref 0.5–1.9)

## 2021-03-06 LAB — PROCALCITONIN: Procalcitonin: <0.1 ng/mL

## 2021-03-06 LAB — C-REACTIVE PROTEIN: CRP: 10.9 mg/dL — ABNORMAL HIGH (ref <1.0)

## 2021-03-06 LAB — TROPONIN I (HIGH SENSITIVITY): Troponin I (High Sensitivity): 17 ng/L (ref <18)

## 2021-03-06 LAB — AMMONIA: Ammonia: 27 umol/L (ref 9–35)

## 2021-03-06 LAB — SEDIMENTATION RATE: Sed Rate: 47 mm/hr — ABNORMAL HIGH (ref 0–30)

## 2021-03-06 LAB — D-DIMER, QUANTITATIVE: D-Dimer, Quant: 19.91 ug/mL-FEU — ABNORMAL HIGH (ref 0.00–0.50)

## 2021-03-06 MED ORDER — MOMETASONE FURO-FORMOTEROL FUM 100-5 MCG/ACT IN AERO
2.0000 | INHALATION_SPRAY | Freq: Two times a day (BID) | RESPIRATORY_TRACT | Status: DC
  Administered 2021-03-06: 04:00:00 2 via RESPIRATORY_TRACT
  Filled 2021-03-06: qty 8.8

## 2021-03-06 MED ORDER — BUSPIRONE HCL 10 MG PO TABS
10.0000 mg | ORAL_TABLET | Freq: Two times a day (BID) | ORAL | Status: DC
  Administered 2021-03-06: 10 mg via ORAL
  Filled 2021-03-06 (×2): qty 1

## 2021-03-06 MED ORDER — LEVETIRACETAM 500 MG PO TABS
500.0000 mg | ORAL_TABLET | Freq: Two times a day (BID) | ORAL | Status: DC
  Administered 2021-03-06: 500 mg via ORAL
  Filled 2021-03-06 (×2): qty 1

## 2021-03-06 NOTE — Discharge Summary (Signed)
[facility] Discharge Summary    AMA (AGAINST MEDICAL ADVICE)  Patient: Brenda Middleton WUJ:[id number]  PCP: Remi Haggard, FNP  Date of admission: 03/05/2021   Date of discharge:  03/06/2021     Discharge Diagnoses:  Principal diagnosis Acute hypoxic respiratory failure Active Problems:   COPD exacerbation ([place])   Admitted From: Home Disposition:  Home, signed AMA, could not wait for the discharge order.  Recommendations for Outpatient Follow-up:  1. PCP: In 1 week 2. Follow with the pulmonologist in 1 week 3. Follow-up with oncologist as per schedule  Diet recommendation: Regular diet  Activity: The patient is advised to gradually reintroduce usual activities, as tolerated  Discharge Condition: stable  Code Status: DNR   History of present illness: As per the H and P dictated on admission  Hospital Course:   ODIE RAUEN is a 60 y.o. female with medical history significant ofrecently diagnose lung cancer with mets to the brain (2/22), hypertension, COPD, GERD, depression with anxiety, anemia, HCV, who presents with shortness breath. Patient of note has recent interim history of discharge  03/02/21 after 3 days hospitalization for HCAP as well as COPD exacerbation. Per patient she completed course of discharge antibiotics today. She notes that although on discharge she did feel somewhat improved she was not back to baseline. She states at home her sob returned and progressed with today noting significant worsening. She notes no chest pain,fever  but has had increase sob, and feeling of presyncope. Due to the severity of her symptoms EMS was called.  She was noted on arrival to ed to have sat of 89% on ra with decrease in sat to movement. She was placed onf 2L with stabilization of saturations to 94%. She notes no n/v/d/dysuria or abdominal  Pain, she states she have been able to tolerate po without difficulty. ED Course:  In ED Afeb, bp 123/66, hr 107, rr 24, sat 94% on  ra with movement drop to 87% Labs: wbc 19.8 down from prior 24,  hgb 9.5 at base, plt 214 NA 140, K 3.5, Cl105,  Cr 0.77, alkphos 290 trending down CE22, BNP22, Respiratory panel: neg tx dounebs x3 Cxr: IMPRESSION: Patchy bilateral airspace disease again noted, not significantly Changed. Assessment/Plan # Acute hypoxic respiratory failure due to AECOPD in background of lung ca and recent HCAP, patient breathing improved after treatment, currently patient is saturating well on room air, oxygen dropped to 93% on ambulation and at rest she was satting 96%.  Patient was recommended to continue home dose Decadron and inhalers.  Patient still smokes, smoking cessation counseling done. # Acute COPD exacerbation, improved, no significant wheezing on exam.  Patient was advised to continue home dose steroids and inhalers and follow with PCP and pulmonologist as an outpatient. # HCAP, POA. cxr unchanged, wbc count decreased from prior even in setting of steroid use. course of antibiotics completed 4/2, no fever.  No need of empiric antibiotics.  Patient was advised to monitor temperature at home and follow with PCP for further management. # Lung CA with mets to brain, plan for radiation /as well as chemo, tentative date for chemo 4/11, Patient follow with  Dr.Yu, continue Decadron PTA dose. continue on Keppra for  sz  Precautions  # GERD, Protonix #  Pathologic compression fx of lumbar spine, -Continue home fentanyl patch and as needed Percocet # HTN, resume home regimen, BP currently stable  # Depression/anxiety, Continue home BuSpar, hydroxyzine, Seroquel # Anemia, Hbg stable # Elevated Cardiac  Enzyme, decreased from previous hospitalization . hought due to demand ischemia secondary to acute hypoxic respiratory failure.  Patient remained chest pain-free.  Follow with PCP as an outpatient. Body mass index is 18.43 kg/m. Nutrition Interventions:  Patient was instructed, not to drive, operate heavy  machinery, perform activities at heights, swimming or participation in water activities or provide baby sitting services while on Pain, Sleep and Anxiety Medications; until her outpatient Physician has advised to do so again.  Also recommended to not to take more than prescribed Pain, Sleep and Anxiety Medications. Patient was ambulatory without any assistance. On the day of the discharge the patient's vitals were stable, and no other acute medical condition were reported by patient. the patient was felt safe to be discharge home.    Consultants: None Procedures: None  Discharge Exam: General: Appear in no distress, no Rash; Oral Mucosa Clear, moist. Cardiovascular: S1 and S2 Present, no Murmur, Respiratory: normal respiratory effort, Bilateral Air entry present and no Crackles, no wheezes Abdomen: Bowel Sound present, Soft and no tenderness, no hernia Extremities: no Pedal edema, no calf tenderness Neurology: alert and oriented to time, place, and person affect appropriate.  Filed Weights   03/05/21 2108  Weight: 51.8 kg   Vitals:   03/06/21 0731 03/06/21 1148  BP:  (!) 123/94  Pulse:  100  Resp:  18  Temp:  98.1 F (36.7 C)  SpO2: 90% 94%    DISCHARGE MEDICATION:  Allergies  Allergen Reactions  . Penicillin G Hives    Other reaction(s): HIVES Other reaction(s): HIVES  . Penicillin V Itching  . Penicillins      Allergies as of 03/06/2021      Reactions   Penicillin G Hives   Other reaction(s): HIVES Other reaction(s): HIVES   Penicillin V Itching   Penicillins      Med Rec must be completed prior to using this [place]  No new medications, discharge med rec was not completed as patient signed AMA and left.  Patient could not wait for the official discharge orders.      The results of significant diagnostics from this hospitalization (including imaging, microbiology, ancillary and laboratory) are listed below for reference.    Significant Diagnostic  Studies: DG Chest 2 View  Result Date: 03/05/2021 CLINICAL DATA:  Shortness of breath EXAM: CHEST - 2 VIEW COMPARISON:  02/27/2021 FINDINGS: Heart is normal size. Right upper lobe mass again noted as seen on CT. Patchy bilateral airspace disease again noted, similar to prior study. No effusions or acute bony abnormality. IMPRESSION: Patchy bilateral airspace disease again noted, not significantly changed. Right upper lobe mass again seen. Electronically Signed   By: Rolm Baptise M.D.   On: 03/05/2021 21:42   DG Chest 2 View  Result Date: 02/15/2021 CLINICAL DATA:  Weight loss and cough EXAM: CHEST - 2 VIEW COMPARISON:  None. FINDINGS: The heart size and mediastinal contours are within normal limits. Rounded patchy nodular opacity seen within the right upper lobe and right mid lung. The left lung is clear. No acute osseous abnormality. IMPRESSION: Rounded patchy airspace opacities in the right mid lung which may be due to infectious etiology, however cannot exclude metastatic disease. Would recommend CT chest with contrast for further evaluation. Electronically Signed   By: Prudencio Pair M.D.   On: 02/15/2021 12:16   CT Head Wo Contrast  Result Date: 02/15/2021 CLINICAL DATA:  Headache with left-sided face/neck/head pain. On antibiotics for reported laryngitis with bilateral ear pain. EXAM: CT  HEAD WITHOUT CONTRAST CT MAXILLOFACIAL WITHOUT CONTRAST TECHNIQUE: Multidetector CT imaging of the head and maxillofacial structures were performed using the standard protocol without intravenous contrast. Multiplanar CT image reconstructions of the maxillofacial structures were also generated. COMPARISON:  None. FINDINGS: CT HEAD FINDINGS Brain: The calvarial/skull base mass lesion is described below. Spur this process exerts mass effect on the left cerebellum with suspected narrowing of the fourth ventricle. No evidence of hydrocephalus. No evidence of acute large vascular territory infarct. No acute hemorrhage. Mild  to moderate scattered white matter hypodensities, which are nonspecific but most likely relate to chronic microvascular ischemic disease. No midline shift. Vascular: No hyperdense vessel identified. Calcific atherosclerosis. Skull/skull base: There is aggressive soft tissue mass lesion involving the left occipital calvarium measuring up to approximately 5.5 x 3.1 by 3.3 cm (transverse by AP by craniocaudal) with bony destruction and expansion and extension inferiorly to involve the left temporal bone and skull base. There is mass effect on the inferior left cerebellum and possible involvement of the subjacent dural venous sinuses. Small left mastoid effusion. CT MAXILLOFACIAL FINDINGS Osseous: No fracture or mandibular dislocation. No destructive process in the face. Orbits: Negative. No traumatic or inflammatory finding. Sinuses: Small right maxillary sinus retention cyst. Otherwise, sinuses are clear. Soft tissues: Negative. 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 Resulting mass effect on the left cerebellum and possible involvement of the subjacent dural venous sinuses. 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 Osteomyelitis is a differential consideration. Recommend MRI brain with and without contrast to further evaluate. Findings and recommendations discussed with Ashok Cordia, PA via telephone at 11:20 a.m. Electronically Signed   By: Margaretha Sheffield MD   On: 02/15/2021 11:29   CT CHEST W CONTRAST  Result Date: 03/06/2021 CLINICAL DATA:  Respiratory failure, shortness of breath EXAM: CT CHEST WITH CONTRAST TECHNIQUE: Multidetector CT imaging of the chest was performed during intravenous contrast administration. CONTRAST:  33mL OMNIPAQUE IOHEXOL 300 MG/ML  SOLN COMPARISON:  02/27/2021.  Chest x-ray 03/05/2021  FINDINGS: Cardiovascular: Heart is normal size. Coronary artery and aortic calcifications. No aneurysm. Mediastinum/Nodes: Bulky mediastinal and bilateral hilar adenopathy again noted, unchanged. Trachea is patent. Thyroid unremarkable. Lungs/Pleura: Ground-glass airspace opacities in the lower lobes have worsened since prior study, particularly in the left lower lobe. Ground-glass opacities in the upper lobes somewhat improved since prior study. Numerous bilateral pulmonary nodules are stable. Right upper lobe mass again noted and stable. No effusions. Upper Abdomen: Imaging into the upper abdomen demonstrates no acute findings. Musculoskeletal: Chest wall soft tissues are unremarkable. Multiple osseous metastases again noted, unchanged. IMPRESSION: Redemonstrated is extensive metastatic disease in the chest with numerous pulmonary nodules, dominant right upper lobe mass, and bulky mediastinal/bilateral hilar adenopathy. Ground-glass opacities have increased in the lower lobes, left greater than right, likely infectious/inflammatory. Somewhat improvement in ground-glass opacities in the upper lobe since prior study. Stable osseous metastatic disease. Coronary artery disease. Aortic Atherosclerosis (ICD10-I70.0). Electronically Signed   By: Rolm Baptise M.D.   On: 03/06/2021 00:11   CT Angio Chest PE W and/or Wo Contrast  Result Date: 02/27/2021 CLINICAL DATA:  PE suspected, widely metastatic lung cancer EXAM: CT ANGIOGRAPHY CHEST WITH CONTRAST TECHNIQUE: Multidetector CT imaging of the chest was performed using the standard protocol during bolus administration of intravenous contrast. Multiplanar CT image reconstructions and MIPs were obtained to evaluate  the vascular anatomy. CONTRAST:  14mL OMNIPAQUE IOHEXOL 350 MG/ML SOLN COMPARISON:  02/15/2021 FINDINGS: Cardiovascular: Satisfactory opacification of the pulmonary arteries to the segmental level. No evidence of pulmonary embolism. Cardiomegaly. Left and  right coronary artery calcifications and stents. No pericardial effusion. Mediastinum/Nodes: Redemonstrated, very extensive bulky bilateral hilar, mediastinal, supraclavicular, and left axillary lymphadenopathy. Thyroid gland, trachea, and esophagus demonstrate no significant findings. Lungs/Pleura: Diffuse bilateral bronchial wall thickening. Spiculated right upper lobe mass as seen on prior examination (series 7, image 42). Numerous redemonstrated small pulmonary nodules throughout the lungs. There is new, extensive ground-glass airspace opacity throughout the lungs, somewhat geographic in the upper lobes (series 7, image 33), although somewhat more nodular appearing in the lower lungs (series 7, image 58). No pleural effusion or pneumothorax. Upper Abdomen: No acute abnormality. Multiple low-attenuation lesions of liver, better assessed by prior dedicated of the abdomen. Musculoskeletal: No chest wall abnormality. Multiple osseous metastatic lesions, most significantly a lytic lesion of the T9 vertebral body (series 9, image 58). Review of the MIP images confirms the above findings. IMPRESSION: 1. Negative examination for pulmonary embolism. 2. There is new, extensive ground-glass airspace opacity throughout the lungs, somewhat geographic in the upper lobes, although somewhat more nodular appearing in the lower lungs. Findings are nonspecific and infectious or inflammatory, differential considerations primarily include drug toxicity and atypical/viral infection. 3. Diffuse bilateral bronchial wall thickening, consistent with nonspecific infectious or inflammatory bronchitis. 4. Redemonstrated findings of advanced metastatic lung malignancy, better assessed by recent prior staging examination. 5. Coronary artery disease. Electronically Signed   By: Eddie Candle M.D.   On: 02/27/2021 10:49   CT Thoracic Spine Wo Contrast  Result Date: 02/18/2021 CLINICAL DATA:  Metastases on CT abdomen EXAM: CT THORACIC AND  LUMBAR SPINE WITHOUT CONTRAST TECHNIQUE: Multidetector CT imaging of the thoracic and lumbar spine was performed without contrast. Multiplanar CT image reconstructions were also generated. COMPARISON:  None. FINDINGS: CT THORACIC SPINE FINDINGS Alignment: Preserved. Vertebrae: There is a small partially sclerotic lesion at the right aspect of the T5 vertebral body. A lytic lesion is present at the anterior aspect T9 likely with pathologic fracture but no substantial height loss. Ill-defined lucent lesions are present elsewhere. Paraspinal and other soft tissues: Better evaluated on prior dedicated imaging. Disc levels: Multilevel degenerative changes. No high-grade degenerative canal narrowing. CT LUMBAR SPINE FINDINGS Segmentation: 5 lumbar type vertebrae. Alignment: Dextrocurvature.  Grade 1 anterolisthesis at L4-L5 Vertebrae: Lucency and sclerosis at L2 with pathologic compression fracture resulting in less than 50% loss of height at the superior endplate. Sclerotic lesion of the right sacrum. Paraspinal and other soft tissues: Better evaluated on prior dedicated imaging. Disc levels: Multilevel degenerative changes. Canal stenosis is greatest at L3-L4 and L4-L5. Foraminal narrowing is greatest on the right at L5-S1. IMPRESSION: Sclerotic and lytic metastatic lesions as already identified on the CT chest, abdomen, and pelvis. Pathologic L2 fracture with less than 50% loss of height. Probable pathologic fracture at T9 without height loss. No apparent significant epidural disease by CT. Electronically Signed   By: Macy Mis M.D.   On: 02/18/2021 10:53   CT Lumbar Spine Wo Contrast  Result Date: 02/18/2021 CLINICAL DATA:  Metastases on CT abdomen EXAM: CT THORACIC AND LUMBAR SPINE WITHOUT CONTRAST TECHNIQUE: Multidetector CT imaging of the thoracic and lumbar spine was performed without contrast. Multiplanar CT image reconstructions were also generated. COMPARISON:  None. FINDINGS: CT THORACIC SPINE  FINDINGS Alignment: Preserved. Vertebrae: There is a small partially sclerotic lesion at the right  aspect of the T5 vertebral body. A lytic lesion is present at the anterior aspect T9 likely with pathologic fracture but no substantial height loss. Ill-defined lucent lesions are present elsewhere. Paraspinal and other soft tissues: Better evaluated on prior dedicated imaging. Disc levels: Multilevel degenerative changes. No high-grade degenerative canal narrowing. CT LUMBAR SPINE FINDINGS Segmentation: 5 lumbar type vertebrae. Alignment: Dextrocurvature.  Grade 1 anterolisthesis at L4-L5 Vertebrae: Lucency and sclerosis at L2 with pathologic compression fracture resulting in less than 50% loss of height at the superior endplate. Sclerotic lesion of the right sacrum. Paraspinal and other soft tissues: Better evaluated on prior dedicated imaging. Disc levels: Multilevel degenerative changes. Canal stenosis is greatest at L3-L4 and L4-L5. Foraminal narrowing is greatest on the right at L5-S1. IMPRESSION: Sclerotic and lytic metastatic lesions as already identified on the CT chest, abdomen, and pelvis. Pathologic L2 fracture with less than 50% loss of height. Probable pathologic fracture at T9 without height loss. No apparent significant epidural disease by CT. Electronically Signed   By: Macy Mis M.D.   On: 02/18/2021 10:53   MR Brain W and Wo Contrast  Result Date: 02/15/2021 CLINICAL DATA:  Abnormal head CT.  Skull base mass. EXAM: MRI HEAD WITHOUT AND WITH CONTRAST TECHNIQUE: Multiplanar, multiecho pulse sequences of the brain and surrounding structures were obtained without and with intravenous contrast. CONTRAST:  24mL GADAVIST GADOBUTROL 1 MMOL/ML IV SOLN COMPARISON:  CT head 02/15/2021 FINDINGS: Brain: Large destructive mass in the posterior skull base bilaterally left greater than right. This involves the occipital bone with extension into the left temporal bone. The occipital bone is significantly  expanded due to tumor on the left with mass-effect on the left cerebellum. Soft tissue mass associated with left occipital lesion measures approximately 5.2 x 3.0 cm. Mild edema in the left cerebellum. There is infiltrating tumor in the left temporal bone which is more sizable than would be predicted from the CT. The soft tissue mass associated with the left occipital bone shows susceptibility which may be due to mineralization and/or bone fragments from bone destruction. Ventricle size normal. No midline shift. Moderate white matter changes likely due to chronic microvascular ischemia. No acute infarct. Chronic microhemorrhage in the left parietal lobe. Subtle enhancing lesions in the brain in the right frontal lobe measuring approximately 10 mm, and 4 mm best seen on coronal and sagittal postcontrast imaging. This is suspicious for metastatic disease. Vascular: Normal arterial flow voids. Normal venous enhancement. No evidence of venous sinus thrombosis. Skull and upper cervical spine: Infiltrating destructive mass in the occipital bone bilaterally left greater than right with extension into the left temporal bone. Additional lesions in the frontal bone bilaterally and in the left parietal bone likely metastatic disease. Sinuses/Orbits: Mild mucosal edema paranasal sinuses. Negative orbit Other: None IMPRESSION: Destructive mass lesion in the occipital bone bilaterally left greater than right. There is associated soft tissue mass in the left occipital bone extending into the posterior fossa with mass-effect and mild edema of the left cerebellum. This mass extends into the left temporal bone. Additional bone lesions are present in the frontal bones and left parietal bone. Subtle enhancing lesions right frontal lobe measuring 10 mm and 4 mm. Findings compatible with metastatic disease. Electronically Signed   By: Franchot Gallo M.D.   On: 02/15/2021 13:47   NM Bone Scan Whole Body  Result Date:  02/27/2021 CLINICAL DATA:  Metastatic disease evaluation, lung mass, abnormal CT EXAM: NUCLEAR MEDICINE WHOLE BODY BONE SCAN TECHNIQUE: Whole body  anterior and posterior images were obtained approximately 3 hours after intravenous injection of radiopharmaceutical. RADIOPHARMACEUTICALS:  19.79 mCi Technetium-13m MDP IV COMPARISON:  None Correlation: CT chest abdomen pelvis 02/15/2021, CT thoracic and lumbar spine 02/18/2021, CT head 02/15/2021 FINDINGS: Multiple sites of abnormal tracer uptake are identified consistent with widespread osseous metastatic disease. These include calvaria greatest at occipital bone growth more on LEFT, thoracic and lumbar spine, RIGHT ribs, and pelvis. Dextroconvex thoracolumbar scoliosis. No definite abnormal tracer uptake within the humeri or femora. Expected urinary tract and soft tissue distribution of tracer. IMPRESSION: Scattered osseous metastases as above. Electronically Signed   By: Lavonia Dana M.D.   On: 02/27/2021 18:17   US Renal  Result Date: 02/18/2021 CLINICAL DATA:  Back pain. Right renal pelvis fullness on right upper quadrant ultrasound performed 1 day prior EXAM: RENAL / URINARY TRACT ULTRASOUND COMPLETE COMPARISON:  02/17/2021 abdominal sonogram. FINDINGS: Right Kidney: Renal measurements: 10.3 x 4.2 x 4.2 cm = volume: 95 mL. Normal parenchymal echogenicity and thickness. Mild right hydronephrosis. Possible 5 mm right ureteropelvic junction stone. Nonobstructing 3 mm lower right renal stone. No renal masses. Left Kidney: Renal measurements: 10.0 x 4.8 x 4.2 cm = volume: 107 mL. Normal parenchymal echogenicity and thickness. No left hydronephrosis. No renal masses. Probable nonobstructing 7 mm upper left renal stone. Bladder: Appears normal for degree of bladder distention. Bilateral ureteral jets seen in the bladder. Other: None. IMPRESSION: 1. Mild right hydronephrosis. Possible 5 mm right UPJ stone. Nonobstructing 3 mm lower right renal stone. Suggest unenhanced  CT abdomen/pelvis for further evaluation. 2. Probable nonobstructing upper left renal stone. Electronically Signed   By: Ilona Sorrel M.D.   On: 02/18/2021 12:02   CT CHEST ABDOMEN PELVIS W CONTRAST  Result Date: 02/15/2021 CLINICAL DATA:  Altered mental status. 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 EXAM: CT CHEST, ABDOMEN, AND PELVIS WITH CONTRAST TECHNIQUE: Multidetector CT imaging of the chest, abdomen and pelvis was performed following the standard protocol during bolus administration of intravenous contrast. CONTRAST:  75 mL OMNIPAQUE IOHEXOL 300 MG/ML  SOLN COMPARISON:  None. FINDINGS: CT CHEST FINDINGS Cardiovascular: No significant vascular findings. Normal heart size. No pericardial effusion. Mediastinum/Nodes: The patient has extensive lymphadenopathy. A 2 cm left axillary node is seen on image 11 of series 2; a second left axillary node on image 14 measures 1.4 cm. A 1.1 cm subpectoral node is seen on the left on image 15 of series 2. A 1.4 cm left supraclavicular node is seen on image 9. Right paratracheal node on image 15 measures 1.9 cm. Right precarinal nodal mass on image 25 measures 2.6 cm and there is a right hilar nodal mass measuring 4.2 x 3.1 cm on image 30. The esophagus and thyroid are negative. Lungs/Pleura: Small right pleural effusion. A spiculated nodule in the right middle lobe measures 2.6 cm transverse by 2.7 cm AP on image 77, series 4. Scattered pulmonary nodules include a 0.5 cm nodule in the superior segment of the left lower lobe on image 80, 0.4 cm left upper lobe nodule on image 76 and 2 punctate nodules on image 83. Additional smaller nodules are seen scattered through both lungs. The lungs are emphysematous. No pleural effusion. Musculoskeletal: A 0.9 cm lytic lesion is seen in T5, lytic lesions are seen in T7 and T9, larger in T9. There is also a small lytic lesion in T10. CT ABDOMEN PELVIS FINDINGS Hepatobiliary: Metastatic lesions are seen in the  left hepatic lobe measuring 1.7  cm on image 57, series 2 and near the dome of the liver on image 50 of series 2 where a 1.1 cm lesion is identified. A second lesion near the dome of the liver in the right hepatic lobe on image 50 measures 0.7 cm. There is mild intra and extrahepatic biliary ductal dilatation likely related to prior cholecystectomy. Pancreas: Unremarkable. No pancreatic ductal dilatation or surrounding inflammatory changes. Spleen: Normal in size without focal abnormality. Adrenals/Urinary Tract: Adrenal glands are unremarkable. Kidneys are normal, without renal calculi, focal lesion, or hydronephrosis. Bladder is unremarkable. Stomach/Bowel: Stomach is within normal limits. Appendix appears normal. No evidence of bowel wall thickening, distention, or inflammatory changes. Moderately large colonic stool burden throughout noted. Vascular/Lymphatic: Aortic atherosclerosis. No aneurysm. No pathologic lymphadenopathy by CT size criteria. Reproductive: Status post hysterectomy. No adnexal masses. Other: None. Musculoskeletal: Mottled appearance of the L2 vertebral body is consistent with metastatic disease. There is a mild pathologic superior endplate compression fracture of L2 with vertebral body height loss centrally of approximately 30%. IMPRESSION: Findings consistent with extensive metastatic carcinoma likely emanating from a 2.7 cm right middle lobe pulmonary nodule. Metastases include extensive lymphadenopathy in the chest,, pulmonary nodule bone lesions and liver lesions. Mild pathologic fracture of L2 with vertebral body height loss centrally of up to approximately 30% is again noted. Aortic Atherosclerosis (ICD10-I70.0) and Emphysema (ICD10-J43.9). Electronically Signed   By: Inge Rise M.D.   On: 02/15/2021 14:25   DG Chest Port 1 View  Result Date: 02/27/2021 CLINICAL DATA:  Worsening shortness of breath since this morning. Recent diagnosis of lung cancer. EXAM: PORTABLE CHEST 1 VIEW  COMPARISON:  Radiograph earlier today.  CT earlier today. FINDINGS: Stable heart size and mediastinal contours. Right hilar prominence and thickening of the lower paratracheal stripe related to known adenopathy. Unchanged right mid lung pulmonary nodule. Mild patchy bilateral suprahilar opacities corresponding to ground-glass opacity on CT earlier today. No significant change in the interim. No pneumothorax or large pleural effusion. Retained excreted IV contrast in the right renal collecting system with right hydronephrosis, partially included in the upper abdomen. IMPRESSION: 1. Stable radiographic appearance of the chest from earlier today. Similar patchy suprahilar opacities corresponding to ground-glass opacity on CT. 2. Right mid lung pulmonary nodule and thoracic adenopathy. 3. Partially included in the upper abdomen is retained excreted contrast in the right renal collecting system with right hydronephrosis. Right hydronephrosis was seen on renal ultrasound 02/18/2021. Electronically Signed   By: Keith Rake M.D.   On: 02/27/2021 15:04   DG Chest Portable 1 View  Result Date: 02/27/2021 CLINICAL DATA:  60 year old female with shortness of breath and cough. Metastatic lung cancer. EXAM: PORTABLE CHEST 1 VIEW COMPARISON:  02/15/2021 and prior studies FINDINGS: New hazy opacities within the central/upper lungs noted. RIGHT middle lobe mass and small scattered pulmonary nodules bilaterally are again identified. No pleural effusion or pneumothorax. No acute bony abnormalities are identified. IMPRESSION: New hazy opacities within the central/upper lungs which may represent edema or infection. Unchanged RIGHT middle lobe mass and bilateral pulmonary nodules compatible with known malignancy/metastases. Electronically Signed   By: Margarette Canada M.D.   On: 02/27/2021 08:49   [place] CORE BIOPSY (LYMPH NODES)  Result Date: 02/21/2021 INDICATION: Multiple enlarged lymph nodes, right middle lobe lung mass, liver  lesions and bone lesions. EXAM: ULTRASOUND GUIDED CORE BIOPSY OF LEFT SUPRACLAVICULAR LYMPH NODE MEDICATIONS: None. ANESTHESIA/SEDATION: Versed 2.0 mg IV Moderate Sedation Time:  12 minutes. The patient was continuously monitored during the procedure by  the interventional radiology nurse under my direct supervision. PROCEDURE: The procedure, risks, benefits, and alternatives were explained to the patient. Questions regarding the procedure were encouraged and answered. The patient understands and consents to the procedure. A time-out was performed prior to initiating the procedure. The left neck was prepped with chlorhexidine in a sterile fashion, and a sterile drape was applied covering the operative field. A sterile gown and sterile gloves were used for the procedure. Local anesthesia was provided with 1% Lidocaine. After localizing an enlarged left supraclavicular lymph node by ultrasound, multiple 18 gauge core biopsy samples were obtained. Core biopsy samples were submitted in formalin as well as on saline soaked Telfa gauze. Additional ultrasound was performed. COMPLICATIONS: None immediate. FINDINGS: An enlarged left supraclavicular lymph node measures approximately 3.0 x 1.8 x 2.9 cm. Solid core biopsy samples were obtained. IMPRESSION: Ultrasound-guided core biopsy performed of an enlarged left supraclavicular lymph node measuring 3 cm in greatest dimensions. Electronically Signed   By: Aletta Edouard M.D.   On: 02/21/2021 15:31   CT Maxillofacial Wo Contrast  Result Date: 02/15/2021 CLINICAL DATA:  Headache with left-sided face/neck/head pain. On antibiotics for reported laryngitis with bilateral ear pain. EXAM: CT HEAD WITHOUT CONTRAST CT MAXILLOFACIAL WITHOUT CONTRAST TECHNIQUE: Multidetector CT imaging of the head and maxillofacial structures were performed using the standard protocol without intravenous contrast. Multiplanar CT image reconstructions of the maxillofacial structures were also  generated. COMPARISON:  None. FINDINGS: CT HEAD FINDINGS Brain: The calvarial/skull base mass lesion is described below. Spur this process exerts mass effect on the left cerebellum with suspected narrowing of the fourth ventricle. No evidence of hydrocephalus. No evidence of acute large vascular territory infarct. No acute hemorrhage. Mild to moderate scattered white matter hypodensities, which are nonspecific but most likely relate to chronic microvascular ischemic disease. No midline shift. Vascular: No hyperdense vessel identified. Calcific atherosclerosis. Skull/skull base: There is aggressive soft tissue mass lesion involving the left occipital calvarium measuring up to approximately 5.5 x 3.1 by 3.3 cm (transverse by AP by craniocaudal) with bony destruction and expansion and extension inferiorly to involve the left temporal bone and skull base. There is mass effect on the inferior left cerebellum and possible involvement of the subjacent dural venous sinuses. Small left mastoid effusion. CT MAXILLOFACIAL FINDINGS Osseous: No fracture or mandibular dislocation. No destructive process in the face. Orbits: Negative. No traumatic or inflammatory finding. Sinuses: Small right maxillary sinus retention cyst. Otherwise, sinuses are clear. Soft tissues: Negative. 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 Resulting mass effect on the left cerebellum and possible involvement of the subjacent dural venous sinuses. 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 Osteomyelitis is a differential consideration. Recommend MRI brain with and without contrast to further evaluate. Findings and recommendations discussed with Ashok Cordia, PA via telephone at 11:20 a.m. Electronically Signed   By: Margaretha Sheffield MD   On: 02/15/2021 11:29    US Abdomen Limited RUQ (LIVER/GB)  Result Date: 02/17/2021 CLINICAL DATA:  Nausea and vomiting for 1 day. EXAM: ULTRASOUND ABDOMEN LIMITED RIGHT UPPER QUADRANT COMPARISON:  CT chest, abdomen and pelvis 02/15/2021. FINDINGS: Gallbladder: Removed. Common bile duct: Diameter: 0.8 cm. Liver: Three hypoechoic liver lesions measuring up to 1.8 cm are identified are identified and correlate with metastatic deposit seen on prior CT. Mild intrahepatic biliary ductal dilatation seen on CT  is also noted. Portal vein is patent on color Doppler imaging with normal direction of blood flow towards the liver. Other: There is fullness of the right renal pelvis which is new since the patient's CT scan yesterday. IMPRESSION: Three hypoechoic liver lesions measuring up to 1.8 cm correlate with metastatic deposit seen on CT. Status post cholecystectomy. Mild intra and extrahepatic biliary ductal dilatation is likely related to cholecystectomy. No obstructing lesion is identified. Fullness of the right renal pelvis is new since yesterday's examination. This could be incidental. Correlation with dedicated renal ultrasound could be used for further evaluation if indicated. Electronically Signed   By: Inge Rise M.D.   On: 02/17/2021 12:53    Microbiology: Recent Results (from the past 240 hour(s))  Culture, blood (single)     Status: None   Collection Time: 02/27/21  9:07 AM   Specimen: BLOOD  Result Value Ref Range Status   Specimen Description BLOOD RIGHT FOREARM  Final   Special Requests   Final    BOTTLES DRAWN AEROBIC AND ANAEROBIC Blood Culture adequate volume   Culture   Final    NO GROWTH 5 DAYS Performed at [facility], [address] [place], [place] [address]    Report Status 03/04/2021 FINAL  Final  Resp Panel by RT-PCR (Flu A&B, Covid) Nasopharyngeal Swab     Status: None   Collection Time: 02/27/21  9:07 AM   Specimen: Nasopharyngeal Swab; Nasopharyngeal(NP) swabs in vial transport  medium  Result Value Ref Range Status   SARS Coronavirus 2 by RT PCR NEGATIVE NEGATIVE Final    Comment: (NOTE) SARS-CoV-2 target nucleic acids are NOT DETECTED.  The SARS-CoV-2 RNA is generally detectable in upper respiratory specimens during the acute phase of infection. The lowest concentration of SARS-CoV-2 viral copies this assay can detect is 138 copies/mL. A negative result does not preclude SARS-Cov-2 infection and should not be used as the sole basis for treatment or other patient management decisions. A negative result may occur with  improper specimen collection/handling, submission of specimen other than nasopharyngeal swab, presence of viral mutation(s) within the areas targeted by this assay, and inadequate number of viral copies(<138 copies/mL). A negative result must be combined with clinical observations, patient history, and epidemiological information. The expected result is Negative.  Fact Sheet for Patients:  EntrepreneurPulse.com.au  Fact Sheet for Healthcare Providers:  IncredibleEmployment.be  This test is no t yet approved or cleared by the [place] FDA and  has been authorized for detection and/or diagnosis of SARS-CoV-2 by FDA under an Emergency Use Authorization (EUA). This EUA will remain  in effect (meaning this test can be used) for the duration of the COVID-19 declaration under Section 564(b)(1) of the Act, 21 U.S.C.section 360bbb-3(b)(1), unless the authorization is terminated  or revoked sooner.       Influenza A by PCR NEGATIVE NEGATIVE Final   Influenza B by PCR NEGATIVE NEGATIVE Final    Comment: (NOTE) The Xpert Xpress SARS-CoV-2/FLU/RSV plus assay is intended as an aid in the diagnosis of influenza from Nasopharyngeal swab specimens and should not be used as a sole basis for treatment. Nasal washings and aspirates are unacceptable for Xpert Xpress SARS-CoV-2/FLU/RSV testing.  Fact Sheet for  Patients: EntrepreneurPulse.com.au  Fact Sheet for Healthcare Providers: IncredibleEmployment.be  This test is not yet approved or cleared by the [place] FDA and has been authorized for detection and/or diagnosis of SARS-CoV-2 by FDA under an Emergency Use Authorization (EUA). This EUA will remain in effect (  meaning this test can be used) for the duration of the COVID-19 declaration under Section 564(b)(1) of the Act, 21 U.S.C. section 360bbb-3(b)(1), unless the authorization is terminated or revoked.  Performed at [facility], [address] [place], [place] [address]   MRSA PCR Screening     Status: None   Collection Time: 03/02/21  8:15 AM   Specimen: Nasopharyngeal  Result Value Ref Range Status   MRSA by PCR NEGATIVE NEGATIVE Final    Comment:        The GeneXpert MRSA Assay (FDA approved for NASAL specimens only), is one component of a comprehensive MRSA colonization surveillance program. It is not intended to diagnose MRSA infection nor to guide or monitor treatment for MRSA infections. Performed at [facility], [address] [place], [place] [address]   Resp Panel by RT-PCR (Flu A&B, Covid) Nasopharyngeal Swab     Status: None   Collection Time: 03/05/21  9:43 PM   Specimen: Nasopharyngeal Swab; Nasopharyngeal(NP) swabs in vial transport medium  Result Value Ref Range Status   SARS Coronavirus 2 by RT PCR NEGATIVE NEGATIVE Final    Comment: (NOTE) SARS-CoV-2 target nucleic acids are NOT DETECTED.  The SARS-CoV-2 RNA is generally detectable in upper respiratory specimens during the acute phase of infection. The lowest concentration of SARS-CoV-2 viral copies this assay can detect is 138 copies/mL. A negative result does not preclude SARS-Cov-2 infection and should not be used as the sole basis for treatment or other patient management decisions. A negative result may occur with  improper specimen  collection/handling, submission of specimen other than nasopharyngeal swab, presence of viral mutation(s) within the areas targeted by this assay, and inadequate number of viral copies(<138 copies/mL). A negative result must be combined with clinical observations, patient history, and epidemiological information. The expected result is Negative.  Fact Sheet for Patients:  EntrepreneurPulse.com.au  Fact Sheet for Healthcare Providers:  IncredibleEmployment.be  This test is no t yet approved or cleared by the [place] FDA and  has been authorized for detection and/or diagnosis of SARS-CoV-2 by FDA under an Emergency Use Authorization (EUA). This EUA will remain  in effect (meaning this test can be used) for the duration of the COVID-19 declaration under Section 564(b)(1) of the Act, 21 U.S.C.section 360bbb-3(b)(1), unless the authorization is terminated  or revoked sooner.       Influenza A by PCR NEGATIVE NEGATIVE Final   Influenza B by PCR NEGATIVE NEGATIVE Final    Comment: (NOTE) The Xpert Xpress SARS-CoV-2/FLU/RSV plus assay is intended as an aid in the diagnosis of influenza from Nasopharyngeal swab specimens and should not be used as a sole basis for treatment. Nasal washings and aspirates are unacceptable for Xpert Xpress SARS-CoV-2/FLU/RSV testing.  Fact Sheet for Patients: EntrepreneurPulse.com.au  Fact Sheet for Healthcare Providers: IncredibleEmployment.be  This test is not yet approved or cleared by the [place] FDA and has been authorized for detection and/or diagnosis of SARS-CoV-2 by FDA under an Emergency Use Authorization (EUA). This EUA will remain in effect (meaning this test can be used) for the duration of the COVID-19 declaration under Section 564(b)(1) of the Act, 21 U.S.C. section 360bbb-3(b)(1), unless the authorization is terminated or revoked.  Performed at [facility], [address] [place], [place] [address]      Labs: CBC: Recent Labs  Lab 03/01/21 1252 03/01/21 2059 03/02/21 0551 03/02/21 1349 03/05/21 2143  WBC 19.5* 25.1* 19.8* 24.0* 19.8*  NEUTROABS  --   --   --   --  15.5*  HGB 9.6* 10.0* 8.7* 9.2* 9.5*  HCT 28.9* 29.9* 26.6* 28.8* 29.0*  MCV 87.6 87.2 87.2 90.3 86.8  PLT 235 292 231 244 751   Basic Metabolic Panel: Recent Labs  Lab 02/28/21 0322 03/01/21 0520 03/02/21 0551 03/05/21 2143 03/06/21 0459  NA 140 139 138 140 140  K 3.7 4.1 4.0 3.5 4.4  CL 104 109 110 105 104  CO2 28 24 21* 25 27  GLUCOSE 108* 129* 124* 124* 151*  BUN 19 23* 24* 32* 27*  CREATININE 0.59 0.70 0.62 0.77 0.57  CALCIUM 8.4* 8.5* 8.4* 8.0* 8.2*   Liver Function Tests: Recent Labs  Lab 03/05/21 2143 03/06/21 0459  AST 23 21  ALT 32 32  ALKPHOS 290* 294*  BILITOT 0.6 0.6  PROT 6.1* 6.2*  ALBUMIN 2.9* 2.8*   No results for input(s): LIPASE, AMYLASE in the last 168 hours. Recent Labs  Lab 02/27/21 1243 03/06/21 0459  AMMONIA 17 27   Cardiac Enzymes: No results for input(s): CKTOTAL, CKMB, CKMBINDEX, TROPONINI in the last 168 hours. BNP (last 3 results) Recent Labs    02/27/21 0819 03/05/21 2143  BNP 127.2* 92.2   CBG: Recent Labs  Lab 02/28/21 0813  GLUCAP 100*    Time spent: 35 minutes  Signed:  Val Riles  [facility]  03/06/2021 12:09 PM

## 2021-03-06 NOTE — ED Notes (Signed)
Transport requested for patient.

## 2021-03-06 NOTE — ED Notes (Signed)
Patient transported upstairs by this RN.

## 2021-03-06 NOTE — ED Notes (Signed)
Patient transported to CT 

## 2021-03-06 NOTE — ED Notes (Signed)
Phlebotomy attempted for lab collection without success. Inpatient RN to be notified.

## 2021-03-06 NOTE — ED Notes (Signed)
Request made for transport to the floor ?

## 2021-03-06 NOTE — ED Notes (Signed)
Family updated via phone. Sister notified of patient being admitted to hospital.

## 2021-03-06 NOTE — Plan of Care (Signed)

## 2021-03-06 NOTE — Progress Notes (Signed)
SATURATION QUALIFICATIONS: (This note is used to comply with regulatory documentation for home oxygen)  Patient Saturations on Room Air at Rest = 94%  Patient Saturations on Room Air while Ambulating = 93-96%   Sh walked appx 70 feet and maintained O@ saturation between 93-96%. She does not need home O2.

## 2021-03-06 NOTE — Progress Notes (Signed)
Patient left against medical advice at 1216. Patient informed that MD was working on discharge, but still opted to leave. MD Dwyane Dee notified at 1216 and made aware that patient wanted leave and ultimately left AMA. Last set of vitals stable, and she is sating at >94% on RA. IV and tele box removed prior to leaving. Patient left with all personal belongings.

## 2021-03-07 ENCOUNTER — Other Ambulatory Visit: Payer: Self-pay

## 2021-03-07 ENCOUNTER — Other Ambulatory Visit: Payer: Self-pay | Admitting: *Deleted

## 2021-03-07 ENCOUNTER — Inpatient Hospital Stay: Payer: Medicaid Other

## 2021-03-07 ENCOUNTER — Encounter: Payer: Self-pay | Admitting: *Deleted

## 2021-03-07 ENCOUNTER — Inpatient Hospital Stay
Admission: EM | Admit: 2021-03-07 | Discharge: 2021-03-08 | DRG: 871 | Discharge status: Against Medical Advice | Payer: Medicaid Other | Attending: Hospitalist | Admitting: Hospitalist

## 2021-03-07 ENCOUNTER — Ambulatory Visit
Admission: RE | Admit: 2021-03-07 | Discharge: 2021-03-07 | Disposition: A | Discharge status: Home / Self Care | Payer: Medicaid Other | Source: Ambulatory Visit | Attending: Radiation Oncology | Admitting: Radiation Oncology

## 2021-03-07 ENCOUNTER — Inpatient Hospital Stay: Payer: Medicaid Other | Attending: Oncology | Admitting: Oncology

## 2021-03-07 ENCOUNTER — Emergency Department: Payer: Medicaid Other

## 2021-03-07 ENCOUNTER — Encounter: Payer: Self-pay | Admitting: Oncology

## 2021-03-07 ENCOUNTER — Encounter: Payer: Self-pay | Admitting: Radiation Oncology

## 2021-03-07 VITALS — BP 170/83 | HR 92 | Temp 98.2°F | Resp 20 | Wt 116.2 lb

## 2021-03-07 VITALS — BP 151/131 | HR 107 | Temp 98.0°F | Wt 114.0 lb

## 2021-03-07 DIAGNOSIS — E876 Hypokalemia: Secondary | ICD-10-CM | POA: Diagnosis not present

## 2021-03-07 DIAGNOSIS — Z5111 Encounter for antineoplastic chemotherapy: Secondary | ICD-10-CM | POA: Insufficient documentation

## 2021-03-07 DIAGNOSIS — J9601 Acute respiratory failure with hypoxia: Secondary | ICD-10-CM

## 2021-03-07 DIAGNOSIS — Z5329 Procedure and treatment not carried out because of patient's decision for other reasons: Secondary | ICD-10-CM | POA: Diagnosis present

## 2021-03-07 DIAGNOSIS — I10 Essential (primary) hypertension: Secondary | ICD-10-CM | POA: Diagnosis present

## 2021-03-07 DIAGNOSIS — B182 Chronic viral hepatitis C: Secondary | ICD-10-CM | POA: Diagnosis not present

## 2021-03-07 DIAGNOSIS — K5900 Constipation, unspecified: Secondary | ICD-10-CM | POA: Insufficient documentation

## 2021-03-07 DIAGNOSIS — Z880 Allergy status to penicillin: Secondary | ICD-10-CM

## 2021-03-07 DIAGNOSIS — A419 Sepsis, unspecified organism: Secondary | ICD-10-CM

## 2021-03-07 DIAGNOSIS — C7931 Secondary malignant neoplasm of brain: Secondary | ICD-10-CM | POA: Insufficient documentation

## 2021-03-07 DIAGNOSIS — C770 Secondary and unspecified malignant neoplasm of lymph nodes of head, face and neck: Secondary | ICD-10-CM | POA: Diagnosis not present

## 2021-03-07 DIAGNOSIS — Z801 Family history of malignant neoplasm of trachea, bronchus and lung: Secondary | ICD-10-CM | POA: Diagnosis not present

## 2021-03-07 DIAGNOSIS — Z79899 Other long term (current) drug therapy: Secondary | ICD-10-CM

## 2021-03-07 DIAGNOSIS — J189 Pneumonia, unspecified organism: Secondary | ICD-10-CM | POA: Diagnosis present

## 2021-03-07 DIAGNOSIS — Z7189 Other specified counseling: Secondary | ICD-10-CM | POA: Diagnosis not present

## 2021-03-07 DIAGNOSIS — J441 Chronic obstructive pulmonary disease with (acute) exacerbation: Secondary | ICD-10-CM | POA: Diagnosis present

## 2021-03-07 DIAGNOSIS — M545 Low back pain, unspecified: Secondary | ICD-10-CM | POA: Insufficient documentation

## 2021-03-07 DIAGNOSIS — Z7951 Long term (current) use of inhaled steroids: Secondary | ICD-10-CM | POA: Diagnosis not present

## 2021-03-07 DIAGNOSIS — Z20822 Contact with and (suspected) exposure to covid-19: Secondary | ICD-10-CM | POA: Diagnosis present

## 2021-03-07 DIAGNOSIS — Z87891 Personal history of nicotine dependence: Secondary | ICD-10-CM

## 2021-03-07 DIAGNOSIS — D649 Anemia, unspecified: Secondary | ICD-10-CM | POA: Diagnosis not present

## 2021-03-07 DIAGNOSIS — E500 Vitamin A deficiency with conjunctival xerosis: Secondary | ICD-10-CM | POA: Diagnosis not present

## 2021-03-07 DIAGNOSIS — Y95 Nosocomial condition: Secondary | ICD-10-CM | POA: Diagnosis present

## 2021-03-07 DIAGNOSIS — F418 Other specified anxiety disorders: Secondary | ICD-10-CM | POA: Diagnosis present

## 2021-03-07 DIAGNOSIS — C3490 Malignant neoplasm of unspecified part of unspecified bronchus or lung: Secondary | ICD-10-CM | POA: Diagnosis present

## 2021-03-07 DIAGNOSIS — K219 Gastro-esophageal reflux disease without esophagitis: Secondary | ICD-10-CM | POA: Diagnosis present

## 2021-03-07 DIAGNOSIS — J440 Chronic obstructive pulmonary disease with acute lower respiratory infection: Secondary | ICD-10-CM | POA: Diagnosis present

## 2021-03-07 DIAGNOSIS — C7951 Secondary malignant neoplasm of bone: Secondary | ICD-10-CM | POA: Insufficient documentation

## 2021-03-07 DIAGNOSIS — C787 Secondary malignant neoplasm of liver and intrahepatic bile duct: Secondary | ICD-10-CM | POA: Diagnosis not present

## 2021-03-07 DIAGNOSIS — Z803 Family history of malignant neoplasm of breast: Secondary | ICD-10-CM

## 2021-03-07 DIAGNOSIS — G893 Neoplasm related pain (acute) (chronic): Secondary | ICD-10-CM | POA: Insufficient documentation

## 2021-03-07 DIAGNOSIS — R0602 Shortness of breath: Secondary | ICD-10-CM | POA: Diagnosis not present

## 2021-03-07 DIAGNOSIS — D500 Iron deficiency anemia secondary to blood loss (chronic): Secondary | ICD-10-CM

## 2021-03-07 DIAGNOSIS — G8929 Other chronic pain: Secondary | ICD-10-CM | POA: Diagnosis present

## 2021-03-07 LAB — COMPREHENSIVE METABOLIC PANEL
ALT: 32 U/L (ref 0–44)
AST: 25 U/L (ref 15–41)
Albumin: 2.9 g/dL — ABNORMAL LOW (ref 3.5–5.0)
Alkaline Phosphatase: 329 U/L — ABNORMAL HIGH (ref 38–126)
Anion gap: 9 (ref 5–15)
BUN: 37 mg/dL — ABNORMAL HIGH (ref 6–20)
CO2: 29 mmol/L (ref 22–32)
Calcium: 8.3 mg/dL — ABNORMAL LOW (ref 8.9–10.3)
Chloride: 105 mmol/L (ref 98–111)
Creatinine, Ser: 0.97 mg/dL (ref 0.44–1.00)
GFR, Estimated: >60 mL/min (ref >60)
Glucose, Bld: 156 mg/dL — ABNORMAL HIGH (ref 70–99)
Potassium: 3.8 mmol/L (ref 3.5–5.1)
Sodium: 143 mmol/L (ref 135–145)
Total Bilirubin: 0.5 mg/dL (ref 0.3–1.2)
Total Protein: 6.4 g/dL — ABNORMAL LOW (ref 6.5–8.1)

## 2021-03-07 LAB — RESP PANEL BY RT-PCR (FLU A&B, COVID) ARPGX2
Influenza A by PCR: NEGATIVE
Influenza B by PCR: NEGATIVE
SARS Coronavirus 2 by RT PCR: NEGATIVE

## 2021-03-07 LAB — CBC WITH DIFFERENTIAL/PLATELET
Abs Immature Granulocytes: 0.18 10*3/uL — ABNORMAL HIGH (ref 0.00–0.07)
Basophils Absolute: 0 10*3/uL (ref 0.0–0.1)
Basophils Relative: 0 %
Eosinophils Absolute: 0.1 10*3/uL (ref 0.0–0.5)
Eosinophils Relative: 0 %
HCT: 28.6 % — ABNORMAL LOW (ref 36.0–46.0)
Hemoglobin: 9.2 g/dL — ABNORMAL LOW (ref 12.0–15.0)
Immature Granulocytes: 1 %
Lymphocytes Relative: 11 %
Lymphs Abs: 2.3 10*3/uL (ref 0.7–4.0)
MCH: 28.2 pg (ref 26.0–34.0)
MCHC: 32.2 g/dL (ref 30.0–36.0)
MCV: 87.7 fL (ref 80.0–100.0)
Monocytes Absolute: 1 10*3/uL (ref 0.1–1.0)
Monocytes Relative: 5 %
Neutro Abs: 17.7 10*3/uL — ABNORMAL HIGH (ref 1.7–7.7)
Neutrophils Relative %: 83 %
Platelets: 189 10*3/uL (ref 150–400)
RBC: 3.26 MIL/uL — ABNORMAL LOW (ref 3.87–5.11)
RDW: 15.7 % — ABNORMAL HIGH (ref 11.5–15.5)
WBC: 21.3 10*3/uL — ABNORMAL HIGH (ref 4.0–10.5)
nRBC: 0 % (ref 0.0–0.2)

## 2021-03-07 LAB — LACTIC ACID, PLASMA: Lactic Acid, Venous: 1.6 mmol/L (ref 0.5–1.9)

## 2021-03-07 LAB — TROPONIN I (HIGH SENSITIVITY): Troponin I (High Sensitivity): 187 ng/L (ref <18)

## 2021-03-07 LAB — BRAIN NATRIURETIC PEPTIDE: B Natriuretic Peptide: 110.1 pg/mL — ABNORMAL HIGH (ref 0.0–100.0)

## 2021-03-07 LAB — PROCALCITONIN: Procalcitonin: 0.18 ng/mL

## 2021-03-07 MED ORDER — METHYLPREDNISOLONE SODIUM SUCC 40 MG IJ SOLR
40.0000 mg | Freq: Four times a day (QID) | INTRAMUSCULAR | Status: DC
  Administered 2021-03-08: 40 mg via INTRAVENOUS
  Filled 2021-03-07: qty 1

## 2021-03-07 MED ORDER — IPRATROPIUM-ALBUTEROL 0.5-2.5 (3) MG/3ML IN SOLN
3.0000 mL | Freq: Four times a day (QID) | RESPIRATORY_TRACT | Status: DC
  Administered 2021-03-08: 3 mL via RESPIRATORY_TRACT
  Filled 2021-03-07: qty 3

## 2021-03-07 MED ORDER — IPRATROPIUM-ALBUTEROL 0.5-2.5 (3) MG/3ML IN SOLN
3.0000 mL | Freq: Once | RESPIRATORY_TRACT | Status: AC
  Administered 2021-03-07: 3 mL via RESPIRATORY_TRACT
  Filled 2021-03-07: qty 3

## 2021-03-07 MED ORDER — FENTANYL 50 MCG/HR TD PT72: 1.0000 | MEDICATED_PATCH | TRANSDERMAL | 0 refills | Status: DC

## 2021-03-07 MED ORDER — OXYCODONE-ACETAMINOPHEN 5-325 MG PO TABS: 1.0000 | ORAL_TABLET | ORAL | Status: DC | PRN

## 2021-03-07 MED ORDER — PREDNISONE 20 MG PO TABS: 40.0000 mg | ORAL_TABLET | Freq: Every day | ORAL | Status: DC

## 2021-03-07 MED ORDER — CYANOCOBALAMIN 1000 MCG/ML IJ SOLN
1000.0000 ug | Freq: Once | INTRAMUSCULAR | Status: AC
Start: 2021-03-07 — End: 2021-03-07
  Administered 2021-03-07: 1000 ug via INTRAMUSCULAR
  Filled 2021-03-07: qty 1

## 2021-03-07 MED ORDER — ACETAMINOPHEN 325 MG PO TABS: 650.0000 mg | ORAL_TABLET | Freq: Four times a day (QID) | ORAL | Status: DC | PRN

## 2021-03-07 MED ORDER — SODIUM CHLORIDE 0.9 % IV SOLN: 2.0000 g | Freq: Two times a day (BID) | INTRAVENOUS | Status: DC

## 2021-03-07 MED ORDER — LEVETIRACETAM 500 MG PO TABS
500.0000 mg | ORAL_TABLET | Freq: Two times a day (BID) | ORAL | Status: DC
  Administered 2021-03-08: 500 mg via ORAL
  Filled 2021-03-07: qty 1

## 2021-03-07 MED ORDER — OXYCODONE-ACETAMINOPHEN 5-325 MG PO TABS: 1.0000 | ORAL_TABLET | ORAL | 0 refills | Status: DC | PRN

## 2021-03-07 MED ORDER — ONDANSETRON HCL 4 MG/2ML IJ SOLN: 4.0000 mg | Freq: Four times a day (QID) | INTRAMUSCULAR | Status: DC | PRN

## 2021-03-07 MED ORDER — CYANOCOBALAMIN 1000 MCG/ML IJ SOLN: 1000.0000 ug | Freq: Once | INTRAMUSCULAR | Status: DC

## 2021-03-07 MED ORDER — BUSPIRONE HCL 10 MG PO TABS: 10.0000 mg | ORAL_TABLET | Freq: Two times a day (BID) | ORAL | 2 refills | Status: DC

## 2021-03-07 MED ORDER — METHYLPREDNISOLONE SODIUM SUCC 125 MG IJ SOLR
125.0000 mg | Freq: Once | INTRAMUSCULAR | Status: AC
  Administered 2021-03-07: 125 mg via INTRAVENOUS
  Filled 2021-03-07: qty 2

## 2021-03-07 MED ORDER — ALBUTEROL SULFATE (2.5 MG/3ML) 0.083% IN NEBU: 2.5000 mg | INHALATION_SOLUTION | RESPIRATORY_TRACT | Status: DC | PRN

## 2021-03-07 MED ORDER — VANCOMYCIN HCL IN DEXTROSE 1-5 GM/200ML-% IV SOLN
1000.0000 mg | Freq: Once | INTRAVENOUS | Status: AC
  Administered 2021-03-07: 1000 mg via INTRAVENOUS
  Filled 2021-03-07: qty 200

## 2021-03-07 MED ORDER — PROMETHAZINE HCL 12.5 MG PO TABS: 12.5000 mg | ORAL_TABLET | Freq: Four times a day (QID) | ORAL | 2 refills | Status: AC | PRN

## 2021-03-07 MED ORDER — MORPHINE SULFATE (PF) 2 MG/ML IV SOLN: 2.0000 mg | INTRAVENOUS | Status: DC | PRN

## 2021-03-07 MED ORDER — ONDANSETRON HCL 4 MG PO TABS: 4.0000 mg | ORAL_TABLET | Freq: Four times a day (QID) | ORAL | Status: DC | PRN

## 2021-03-07 MED ORDER — ENOXAPARIN SODIUM 40 MG/0.4ML ~~LOC~~ SOLN
40.0000 mg | SUBCUTANEOUS | Status: DC
  Administered 2021-03-07: 40 mg via SUBCUTANEOUS

## 2021-03-07 MED ORDER — ACETAMINOPHEN 650 MG RE SUPP
650.0000 mg | Freq: Four times a day (QID) | RECTAL | Status: DC | PRN
Start: 2021-03-07 — End: 2021-03-08

## 2021-03-07 MED ORDER — SODIUM CHLORIDE 0.9 % IV SOLN
2.0000 g | Freq: Once | INTRAVENOUS | Status: AC
  Administered 2021-03-07: 2 g via INTRAVENOUS
  Filled 2021-03-07: qty 2

## 2021-03-07 NOTE — ED Provider Notes (Signed)
[facility] Emergency Department Provider Note  ____________________________________________   Event Date/Time   First MD Initiated Contact with Patient 03/07/21 2116     (approximate)  I have reviewed the triage vital signs and the nursing notes.   HISTORY  Chief Complaint Shortness of Breath (SOB, recently  diagnosed lung cancer within the month  , and pneumonia last week )    HPI Brenda Middleton is a 60 y.o. female with recently diagnosed lung cancer with mets to the brain, hypertension, COPD, GERD, depression anxiety who comes in with shortness of breath.  Patient was discharged on 3/30 after being hospitalized for HCAP as well as COPD exacerbation.  Patient then came back to the ER on 4/2 with worsening shortness of breath and found to be 89% on room air thought to be secondary to COPD.  Patient is planned to be started on chemotherapy and radiation in a week.  However patient ended up leaving AMA yesterday.  To note patient did have a CT with contrast done on 4/2.  Patient also reports being taken off her Lasix recently.  Patient states that her shortness of breath is severe, started today, nothing makes it better, nothing makes it worse.  She does report cough as well.  When EMS got there patient was working to breathe with sats at 85 to 86%.           Past Medical History:  Diagnosis Date  . Anxiety   . Benign neoplasm of cervix uteri   . Chronic hepatitis C without mention of hepatic coma   . Chronic low back pain 08/26/2014  . Constipation   . Depressive disorder   . Displacement of cervical intervertebral disc   . Hypertensive disorder   . Iron deficiency anemia due to chronic blood loss 09/12/2019  . Palpitations   . Prolapsed cervical intervertebral disc     Patient Active Problem List   Diagnosis Date Noted  . HCAP (healthcare-associated pneumonia) 02/27/2021  . HTN (hypertension) 02/27/2021  . Depression with anxiety 02/27/2021  .  Normocytic anemia 02/27/2021  . Sepsis ([place]) 02/27/2021  . Protein-calorie malnutrition, moderate ([place]) 02/27/2021  . Malignant neoplasm of lung ([place]) 02/27/2021  . Elevated troponin 02/27/2021  . Acute metabolic encephalopathy 83/41/9622  . Brain metastases ([place]) 02/27/2021  . Shortness of breath   . Metastatic malignant neoplasm ([place])   . Intractable low back pain 02/18/2021  . Anxiety   . Depressive disorder   . Pathologic fracture of lumbar vertebra, initial encounter   . 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 COPD exacerbation ([place])   . GERD (gastroesophageal reflux disease)   . Palliative care encounter   . Pathologic compression fracture of lumbar vertebra ([place])   . Goals of care, counseling/discussion   . Neoplasm related pain   . Iron deficiency anemia due to chronic blood loss 09/12/2019  . Family history of cancer 09/12/2019  . Blood in the stool 09/12/2019  . Elevated alkaline phosphatase level 09/12/2019  . Chronic low back pain 08/26/2014    Past Surgical History:  Procedure Laterality Date  . CONIZATION CERVIX    . DILATION AND CURETTAGE OF UTERUS    . HAND SURGERY  2008,2015   Carpel Tunnel  . TONSILLECTOMY    . VULVECTOMY      Prior to Admission medications   Medication Sig Start Date End Date Taking? Authorizing Provider  albuterol (VENTOLIN HFA) 108 (90 Base) MCG/ACT inhaler Inhale 2  puffs into the lungs every 4 (four) hours as needed for wheezing or shortness of breath. 02/21/21   Danford, Suann Larry, MD  budesonide-formoterol (SYMBICORT) 80-4.5 MCG/ACT inhaler Inhale 2 puffs into the lungs 2 (two) times daily.    [provider]  busPIRone (BUSPAR) 10 MG tablet Take 1 tablet (10 mg total) by mouth 2 (two) times daily. 03/07/21   Earlie Server, MD  dexamethasone (DECADRON) 4 MG tablet Take 1 tablet (4 mg total) by mouth 2 (two) times daily. Patient not taking: Reported on 03/07/2021 03/02/21   Oswald Hillock, MD  fentaNYL (DURAGESIC) 50 MCG/HR Place 1  patch onto the skin every 3 (three) days. 03/07/21   Earlie Server, MD  folic acid (FOLVITE) 1 MG tablet Take 1 tablet (1 mg total) by mouth daily. Start 5-7 days before Alimta chemotherapy. Continue until 21 days after Alimta completed. Patient not taking: Reported on 03/07/2021 03/04/21   Earlie Server, MD  furosemide (LASIX) 20 MG tablet Take 20 mg by mouth 2 (two) times daily. 03/05/21   [provider]  gabapentin (NEURONTIN) 300 MG capsule Take 300 mg by mouth daily. 02/04/21   [provider]  guaiFENesin (MUCINEX) 600 MG 12 hr tablet Take 2 tablets (1,200 mg total) by mouth 2 (two) times daily. 03/02/21   Oswald Hillock, MD  hydrOXYzine (ATARAX/VISTARIL) 25 MG tablet hydroxyzine HCl 25 mg tablet    [provider]  levETIRAcetam (KEPPRA) 500 MG tablet Take 1 tablet (500 mg total) by mouth 2 (two) times daily. 03/02/21   Oswald Hillock, MD  lidocaine-prilocaine (EMLA) cream Apply to affected area once 03/04/21   Earlie Server, MD  metoprolol tartrate (LOPRESSOR) 25 MG tablet Take 0.5 tablets (12.5 mg total) by mouth 2 (two) times daily. 03/02/21 06/30/21  Oswald Hillock, MD  oxyCODONE-acetaminophen (PERCOCET/ROXICET) 5-325 MG tablet Take 1-2 tablets by mouth every 4 (four) hours as needed for severe pain. 03/07/21   Earlie Server, MD  pantoprazole (PROTONIX) 40 MG tablet Take 40 mg by mouth daily. 02/08/21   [provider]  prochlorperazine (COMPAZINE) 10 MG tablet Take 1 tablet (10 mg total) by mouth every 6 (six) hours as needed (Nausea or vomiting). Patient not taking: Reported on 03/07/2021 03/04/21   Earlie Server, MD  promethazine (PHENERGAN) 12.5 MG tablet Take 1 tablet (12.5 mg total) by mouth every 6 (six) hours as needed for nausea or vomiting. 03/07/21   Earlie Server, MD  QUEtiapine (SEROQUEL) 400 MG tablet Take 400 mg by mouth at bedtime.    [provider]  amitriptyline (ELAVIL) 25 MG tablet Take 2 tablets (50 mg total) by mouth at bedtime. Patient not taking: No sig reported 08/26/14  01/01/21  Kathrynn Ducking, MD  clonazePAM (KLONOPIN) 0.5 MG tablet Take 0.5 mg by mouth 2 (two) times daily.  01/01/21  [provider]  temazepam (RESTORIL) 30 MG capsule Take 30 mg by mouth at bedtime.  01/01/21  [provider]    Allergies Penicillin g, Penicillin v, and Penicillins  Family History  Problem Relation Age of Onset  . Breast cancer Mother 23  . Lung cancer Father   . Cancer Brother   . Cancer Other   . Stroke Other     Social History Social History   Tobacco Use  . Smoking status: Former Smoker    Packs/day: 0.70    Years: 35.00    Pack years: 24.50    Types: Cigarettes  . Smokeless tobacco: Never Used  Vaping Use  . Vaping Use: Never used  Substance Use Topics  . Alcohol use: Yes    Comment: occasional wine  . Drug use: No      Review of Systems Constitutional: No fever/chills Eyes: No visual changes. ENT: No sore throat. Cardiovascular: No chest pain Respiratory: Positive for SOB. Cough  Gastrointestinal: No abdominal pain.  No nausea, no vomiting.  No diarrhea.  No constipation. Genitourinary: Negative for dysuria. Musculoskeletal: Negative for back pain. Skin: Negative for rash. Neurological: Negative for headaches, focal weakness or numbness. All other ROS negative ____________________________________________   PHYSICAL EXAM:  VITAL SIGNS: Blood pressure (!) 413/89, pulse (!) 120, temperature 97.9 F (36.6 C), temperature source Oral, resp. rate 18, height 5\' 6"  (1.676 m), weight 51.7 kg, SpO2 99 %.  Constitutional: Alert and oriented. Well appearing and in no acute distress. Eyes: Conjunctivae are normal. EOMI. Head: Atraumatic. Nose: No congestion/rhinnorhea. Mouth/Throat: Mucous membranes are moist.   Neck: No stridor. Trachea Midline. FROM Cardiovascular: Normal rate, regular rhythm. Grossly normal heart sounds.  Good peripheral circulation. Respiratory: increased wob, course breath sounds on 4L   Gastrointestinal: Soft and nontender. No distention. No abdominal bruits.  Musculoskeletal:1+ edema bilaterally.  No joint effusions. Neurologic:  Normal speech and language. No gross focal neurologic deficits are appreciated.  Skin:  Skin is warm, dry and intact. No rash noted. Psychiatric: Mood and affect are normal. Speech and behavior are normal. GU: Deferred   ____________________________________________   LABS (all labs ordered are listed, but only abnormal results are displayed)  Labs Reviewed  CBC WITH DIFFERENTIAL/PLATELET - Abnormal; Notable for the following components:      Result Value   WBC 21.3 (*)    RBC 3.26 (*)    Hemoglobin 9.2 (*)    HCT 28.6 (*)    RDW 15.7 (*)    Neutro Abs 17.7 (*)    Abs Immature Granulocytes 0.18 (*)    All other components within normal limits  RESP PANEL BY RT-PCR (FLU A&B, COVID) ARPGX2  CULTURE, BLOOD (ROUTINE X 2)  CULTURE, BLOOD (ROUTINE X 2)  LACTIC ACID, PLASMA  COMPREHENSIVE METABOLIC PANEL  BRAIN NATRIURETIC PEPTIDE  PROCALCITONIN  PROCALCITONIN  LACTIC ACID, PLASMA  TROPONIN I (HIGH SENSITIVITY)   ____________________________________________   ED ECG REPORT I, Brenda Middleton, the attending physician, personally viewed and interpreted this ECG.  Sinus tachycardia rate of 119, no ST elevation, no T wave inversion, normal intervals ____________________________________________  RADIOLOGY Brenda Middleton, personally viewed and evaluated these images (plain radiographs) as part of my medical decision making, as well as reviewing the written report by the radiologist.  ED MD interpretation: Bilateral patchiness concerning for pneumonia  Official radiology report(s): DG Chest Portable 1 View  Result Date: 03/07/2021 CLINICAL DATA:  Shortness of breath EXAM: PORTABLE CHEST 1 VIEW COMPARISON:  03/05/2021 FINDINGS: Patchy bilateral airspace disease throughout the lungs, slightly worsened since prior study. Nodular areas  within the lungs, stable since prior study. Heart is normal size. No effusions or acute bony abnormality. IMPRESSION: Worsening patchy bilateral airspace disease, likely worsening pneumonia. Scattered nodules unchanged. Electronically Signed   By: Rolm Baptise M.D.   On: 03/07/2021 21:41    ____________________________________________   PROCEDURES  Procedure(s) performed (including Critical Care):  .1-3 Lead EKG Interpretation Performed by: Brenda Bartley, MD Authorized by: Brenda [place], MD     Interpretation: abnormal     ECG rate:  120s    ECG rate assessment: tachycardic  Rhythm: sinus tachycardia     Ectopy: none     Conduction: normal   .Critical Care Performed by: Brenda [place], MD Authorized by: Brenda [place], MD   Critical care provider statement:    Critical care time (minutes):  45   Critical care was time spent personally by me on the following activities:  Discussions with consultants, evaluation of patient's response to treatment, examination of patient, ordering and performing treatments and interventions, ordering and review of laboratory studies, ordering and review of radiographic studies, pulse oximetry, re-evaluation of patient's condition, obtaining history from patient or surrogate and review of old charts     ____________________________________________   INITIAL IMPRESSION / [place] / ED COURSE   Brenda Middleton was evaluated in Emergency Department on 03/07/2021 for the symptoms described in the history of present illness. She was evaluated in the context of the global COVID-19 pandemic, which necessitated consideration that the patient might be at risk for infection with the SARS-CoV-2 virus that causes COVID-19. Institutional protocols and algorithms that pertain to the evaluation of patients at risk for COVID-19 are in a state of rapid change based on information released by regulatory bodies including the CDC and federal and state  organizations. These policies and algorithms were followed during the patient's care in the ED.     Pt presents with SOB. Differential includes: PNA-will get xray to evaluation COPD, not a significant mental wheezing but will trial a DuoNeb and some steroids Anemia-CBC to evaluate ACS- will get trops Arrhythmia-Will get EKG and keep on monitor.  COVID- will get testing per algorithm. PE-lower suspicion given no risk factors and other cause more likely and given the fact that patient had a CT with contrast done yesterday without evidence of PE and she had a CT PE done on 3/27  Patient was placed on oxygen  10:13 PM Patient's white count came back significantly elevated slightly trending up from prior and given her elevated heart rate she meets sepsis criteria.  Her chest x-ray shows worsening opacifications bilaterally concerning for worsening pneumonia patient was started on broad-spectrum antibiotics for healthcare associated pneumonia.  Holding off on significant fluid resuscitation given patient has some edema in her legs and like to see what her BNP is.  Patient weaned down to 2 L of oxygen.  Will discuss possible team for admission due to the above.  Patient's troponin came back at 187.  Is most likely demand in nature.  Will get repeat.       ____________________________________________   FINAL CLINICAL IMPRESSION(S) / ED DIAGNOSES   Final diagnoses:  Healthcare-associated pneumonia  Acute respiratory failure with hypoxia ([place])     MEDICATIONS GIVEN DURING THIS VISIT:  Medications  vancomycin (VANCOCIN) IVPB 1000 mg/200 mL premix (has no administration in time range)  ceFEPIme (MAXIPIME) 2 g in sodium chloride 0.9 % 100 mL IVPB (has no administration in time range)  ipratropium-albuterol (DUONEB) 0.5-2.5 (3) MG/3ML nebulizer solution 3 mL (3 mLs Nebulization Given 03/07/21 2131)  methylPREDNISolone sodium succinate (SOLU-MEDROL) 125 mg/2 mL injection 125 mg (125 mg  Intravenous Given 03/07/21 2131)     ED Discharge Orders    None       Note:  This document was prepared using Dragon voice recognition software and may include unintentional dictation errors.   Brenda Mon[place], MD 03/07/21 2239

## 2021-03-07 NOTE — Progress Notes (Signed)
CODE SEPSIS - PHARMACY COMMUNICATION  **Broad Spectrum Antibiotics should be administered within 1 hour of Sepsis diagnosis**  Time Code Sepsis Called/Page Received: 2209  Antibiotics Ordered: Cefepime + vancomycin  Time of 1st antibiotic administration: 2248  Additional action taken by pharmacy: N/A  Benita Gutter 03/07/2021  10:20 PM

## 2021-03-07 NOTE — Addendum Note (Signed)
Addended by: Telford Nab on: 03/07/2021 02:53 PM   Modules accepted: Orders

## 2021-03-07 NOTE — Telephone Encounter (Signed)
Pt made aware of upcoming appts during clinic visit on 03/07/21

## 2021-03-07 NOTE — Sepsis Progress Note (Signed)
Monitoring for code sepsis protocol. 

## 2021-03-07 NOTE — ED Notes (Signed)
Critical result  Trop 187 MD. Jari Pigg advised

## 2021-03-07 NOTE — Progress Notes (Signed)
Pharmacy Antibiotic Note  Brenda Middleton is a 60 y.o. female admitted on 03/07/2021 with HCAP.  Pharmacy has been consulted for Cefepime and Vancomycin dosing.  Plan: Ordered Cefepime 2 gm q12h per indication and renal fxn.  Vancomycin Pt given Vanc 1 gm in ER Vancomycin 750 mg IV Q 24 hrs.  Goal AUC 400-550. Expected AUC: 436.3 SCr used: 0.97, TBW 51.7 kg < IBW 59.3 kg  Height: 5\' 6"  (167.6 cm) Weight: 51.7 kg (114 lb) IBW/kg (Calculated) : 59.3  Temp (24hrs), Avg:98 F (36.7 C), Min:97.9 F (36.6 C), Max:98.2 F (36.8 C)  Recent Labs  Lab 03/01/21 0520 03/01/21 1252 03/01/21 2059 03/02/21 0551 03/02/21 1349 03/05/21 2143 03/06/21 0459 03/07/21 2123 03/07/21 2124  WBC 14.3*   < > 25.1* 19.8* 24.0* 19.8*  --  21.3*  --   CREATININE 0.70  --   --  0.62  --  0.77 0.57 0.97  --   LATICACIDVEN  --   --   --   --   --   --  1.6  --  1.6   < > = values in this interval not displayed.    Estimated Creatinine Clearance: 50.3 mL/min (by C-G formula based on SCr of 0.97 mg/dL).    Allergies  Allergen Reactions  . Penicillin G Hives    Other reaction(s): HIVES Other reaction(s): HIVES  . Penicillin V Itching  . Penicillins     Antimicrobials this admission: 04/04 Cefepime >>  04/04 Vancomycin >>   Microbiology results: 04/04 BCx: Pending  Thank you for allowing pharmacy to be a part of this patient's care.  Renda Rolls, PharmD, [facility] 03/08/2021 12:08 AM

## 2021-03-07 NOTE — Progress Notes (Signed)
  Oncology Nurse Navigator Documentation  Navigator Location: [facility]Med Onc (03/07/21 1500)   )Navigator Encounter Type: Follow-up Appt;Initial RadOnc (03/07/21 1500)     Confirmed Diagnosis Date: 02/23/21 (03/07/21 1500)               Patient Visit Type: MedOnc;RadOnc (03/07/21 1500)   Barriers/Navigation Needs: Coordination of Care;Education (03/07/21 1500) Education: Newly Diagnosed Cancer Education;Understanding Cancer/ Treatment Options (03/07/21 1500) Interventions: Coordination of Care;Education (03/07/21 1500)   Coordination of Care: Appts;Chemo (03/07/21 1500) Education Method: Written (03/07/21 1500)      Acuity: Level 2-Minimal Needs (1-2 Barriers Identified) (03/07/21 1500)      met with patient during initial consult with Dr. Baruch Gouty and during follow up visit with Dr. Tasia Catchings to discuss treatment options. All questions answered during visit. Pt given resources regarding diagnosis and supportive services available. Reviewed upcoming appts. Contact info given and instructed to call with any further questions or needs. Pt and her sister verbalized understanding.   Time Spent with Patient: 90 (03/07/21 1500)

## 2021-03-07 NOTE — H&P (Signed)
History and Physical    Brenda Middleton FAO:[id number] DOB: December 03, 1961 DOA: 03/07/2021  PCP: Remi Haggard, FNP   Patient coming from: Home  I have personally briefly reviewed patient's old medical records in [place]  Chief Complaint: Shortness of breath  HPI: Brenda Middleton is a 60 y.o. female with medical history significant for Recently diagnosed lung cancer in February 2022 with mets to the brain,, COPD, HTN, depression with anxiety, anemia, HCV hospitalized 3 times in the past month with last 2 hospitalizations from 3/27-3/30, and 4/2-4/3 being for pneumonia, signing out AMA on 4/3 who returns to the emergency room with persistent shortness of breath and cough.  She denies chest pain or fever.  Has no nausea or vomiting.  EMS reports an O2 sat of 85% on room air, improving to 98% on 6 L. ED course: On arrival, afebrile but tachycardic at 120 with O2 sat 96% on 6 L.  BP 127/79.  Blood work significant for leukocytosis of 21,000, hemoglobin 9.2, lactic acid 1.6.  Troponin I 87, BNP 110.  Procalcitonin 0.18. EKG, reviewed and interpreted myself: Sinus tach at 119 with nonspecific ST-T wave changes Imaging: Chest x-ray worsening bilateral pneumonia  Patient treated with duo nebs and Solu-Medrol and sepsis fluids.  Started on cefepime and vancomycin.  Hospitalist consulted for admission.  Review of Systems: Unreliable as patient is very lethargic and not answering questions   Past Medical History:  Diagnosis Date  . Anxiety   . Benign neoplasm of cervix uteri   . Chronic hepatitis C without mention of hepatic coma   . Chronic low back pain 08/26/2014  . Constipation   . Depressive disorder   . Displacement of cervical intervertebral disc   . Hypertensive disorder   . Iron deficiency anemia due to chronic blood loss 09/12/2019  . Palpitations   . Prolapsed cervical intervertebral disc     Past Surgical History:  Procedure Laterality Date  . CONIZATION CERVIX    .  DILATION AND CURETTAGE OF UTERUS    . HAND SURGERY  2008,2015   Carpel Tunnel  . TONSILLECTOMY    . VULVECTOMY       reports that she has quit smoking. Her smoking use included cigarettes. She has a 24.50 pack-year smoking history. She has never used smokeless tobacco. She reports current alcohol use. She reports that she does not use drugs.  Allergies  Allergen Reactions  . Penicillin G Hives    Other reaction(s): HIVES Other reaction(s): HIVES  . Penicillin V Itching  . Penicillins     Family History  Problem Relation Age of Onset  . Breast cancer Mother 69  . Lung cancer Father   . Cancer Brother   . Cancer Other   . Stroke Other       Prior to Admission medications   Medication Sig Start Date End Date Taking? Authorizing Provider  albuterol (VENTOLIN HFA) 108 (90 Base) MCG/ACT inhaler Inhale 2 puffs into the lungs every 4 (four) hours as needed for wheezing or shortness of breath. 02/21/21   Danford, Suann Larry, MD  budesonide-formoterol (SYMBICORT) 80-4.5 MCG/ACT inhaler Inhale 2 puffs into the lungs 2 (two) times daily.    [provider]  busPIRone (BUSPAR) 10 MG tablet Take 1 tablet (10 mg total) by mouth 2 (two) times daily. 03/07/21   Earlie Server, MD  dexamethasone (DECADRON) 4 MG tablet Take 1 tablet (4 mg total) by mouth 2 (two) times daily. Patient not taking: Reported  on 03/07/2021 03/02/21   Oswald Hillock, MD  fentaNYL (DURAGESIC) 50 MCG/HR Place 1 patch onto the skin every 3 (three) days. 03/07/21   Earlie Server, MD  folic acid (FOLVITE) 1 MG tablet Take 1 tablet (1 mg total) by mouth daily. Start 5-7 days before Alimta chemotherapy. Continue until 21 days after Alimta completed. Patient not taking: Reported on 03/07/2021 03/04/21   Earlie Server, MD  furosemide (LASIX) 20 MG tablet Take 20 mg by mouth 2 (two) times daily. 03/05/21   [provider]  gabapentin (NEURONTIN) 300 MG capsule Take 300 mg by mouth daily. 02/04/21   [provider]  guaiFENesin  (MUCINEX) 600 MG 12 hr tablet Take 2 tablets (1,200 mg total) by mouth 2 (two) times daily. 03/02/21   Oswald Hillock, MD  hydrOXYzine (ATARAX/VISTARIL) 25 MG tablet hydroxyzine HCl 25 mg tablet    [provider]  levETIRAcetam (KEPPRA) 500 MG tablet Take 1 tablet (500 mg total) by mouth 2 (two) times daily. 03/02/21   Oswald Hillock, MD  lidocaine-prilocaine (EMLA) cream Apply to affected area once 03/04/21   Earlie Server, MD  metoprolol tartrate (LOPRESSOR) 25 MG tablet Take 0.5 tablets (12.5 mg total) by mouth 2 (two) times daily. 03/02/21 06/30/21  Oswald Hillock, MD  oxyCODONE-acetaminophen (PERCOCET/ROXICET) 5-325 MG tablet Take 1-2 tablets by mouth every 4 (four) hours as needed for severe pain. 03/07/21   Earlie Server, MD  pantoprazole (PROTONIX) 40 MG tablet Take 40 mg by mouth daily. 02/08/21   [provider]  prochlorperazine (COMPAZINE) 10 MG tablet Take 1 tablet (10 mg total) by mouth every 6 (six) hours as needed (Nausea or vomiting). Patient not taking: Reported on 03/07/2021 03/04/21   Earlie Server, MD  promethazine (PHENERGAN) 12.5 MG tablet Take 1 tablet (12.5 mg total) by mouth every 6 (six) hours as needed for nausea or vomiting. 03/07/21   Earlie Server, MD  QUEtiapine (SEROQUEL) 400 MG tablet Take 400 mg by mouth at bedtime.    [provider]  amitriptyline (ELAVIL) 25 MG tablet Take 2 tablets (50 mg total) by mouth at bedtime. Patient not taking: No sig reported 08/26/14 01/01/21  Kathrynn Ducking, MD  clonazePAM (KLONOPIN) 0.5 MG tablet Take 0.5 mg by mouth 2 (two) times daily.  01/01/21  [provider]  temazepam (RESTORIL) 30 MG capsule Take 30 mg by mouth at bedtime.  01/01/21  [provider]    Physical Exam: Vitals:   03/07/21 2128 03/07/21 2129 03/07/21 2130  BP:  (!) 413/89   Pulse:  (!) 120   Resp:  18   Temp:  97.9 F (36.6 C)   TempSrc:  Oral   SpO2: 100% 99%   Weight:   51.7 kg  Height:   5\' 6"  (1.676 m)     Vitals:   03/07/21 2128  03/07/21 2129 03/07/21 2130  BP:  (!) 413/89   Pulse:  (!) 120   Resp:  18   Temp:  97.9 F (36.6 C)   TempSrc:  Oral   SpO2: 100% 99%   Weight:   51.7 kg  Height:   5\' 6"  (1.676 m)      Constitutional:, Frail, unwell, lethargic, increased work of breathing, speaking only in whispers in mild to moderate respiratory distress HEENT:      Head: Normocephalic and atraumatic.         Eyes: PERLA, EOMI, Conjunctivae are normal. Sclera is non-icteric.  Mouth/Throat: Mucous membranes are moist.       Neck: Supple with no signs of meningismus. Cardiovascular:  Tachycardic. No murmurs, gallops, or rubs. 2+ symmetrical distal pulses are present . No JVD. No LE edema Respiratory: Respiratory effort increased, speaking only in one-word sentences and in whispers.Lungs sounds coarse and rhonchitic bilaterally.  Gastrointestinal: Soft, non tender, and non distended with positive bowel sounds.  Genitourinary: No CVA tenderness. Musculoskeletal: Nontender with normal range of motion in all extremities. No cyanosis, or erythema of extremities. Neurologic:  Face is symmetric. Moving all extremities. No gross focal neurologic deficits . Skin: Skin is warm, dry.  No rash or ulcers Psychiatric: Appropriate but difficult to assess due to lethargy   Labs on Admission: I have personally reviewed following labs and imaging studies  CBC: Recent Labs  Lab 03/01/21 2059 03/02/21 0551 03/02/21 1349 03/05/21 2143 03/07/21 2123  WBC 25.1* 19.8* 24.0* 19.8* 21.3*  NEUTROABS  --   --   --  15.5* 17.7*  HGB 10.0* 8.7* 9.2* 9.5* 9.2*  HCT 29.9* 26.6* 28.8* 29.0* 28.6*  MCV 87.2 87.2 90.3 86.8 87.7  PLT 292 231 244 214 510   Basic Metabolic Panel: Recent Labs  Lab 03/01/21 0520 03/02/21 0551 03/05/21 2143 03/06/21 0459 03/07/21 2123  NA 139 138 140 140 143  K 4.1 4.0 3.5 4.4 3.8  CL 109 110 105 104 105  CO2 24 21* 25 27 29   GLUCOSE 129* 124* 124* 151* 156*  BUN 23* 24* 32* 27* 37*   CREATININE 0.70 0.62 0.77 0.57 0.97  CALCIUM 8.5* 8.4* 8.0* 8.2* 8.3*   GFR: Estimated Creatinine Clearance: 50.3 mL/min (by C-G formula based on SCr of 0.97 mg/dL). Liver Function Tests: Recent Labs  Lab 03/05/21 2143 03/06/21 0459 03/07/21 2123  AST 23 21 25   ALT 32 32 32  ALKPHOS 290* 294* 329*  BILITOT 0.6 0.6 0.5  PROT 6.1* 6.2* 6.4*  ALBUMIN 2.9* 2.8* 2.9*   No results for input(s): LIPASE, AMYLASE in the last 168 hours. Recent Labs  Lab 03/06/21 0459  AMMONIA 27   Coagulation Profile: No results for input(s): INR, PROTIME in the last 168 hours. Cardiac Enzymes: No results for input(s): CKTOTAL, CKMB, CKMBINDEX, TROPONINI in the last 168 hours. BNP (last 3 results) No results for input(s): PROBNP in the last 8760 hours. HbA1C: No results for input(s): HGBA1C in the last 72 hours. CBG: No results for input(s): GLUCAP in the last 168 hours. Lipid Profile: No results for input(s): CHOL, HDL, LDLCALC, TRIG, CHOLHDL, LDLDIRECT in the last 72 hours. Thyroid Function Tests: No results for input(s): TSH, T4TOTAL, FREET4, T3FREE, THYROIDAB in the last 72 hours. Anemia Panel: No results for input(s): VITAMINB12, FOLATE, FERRITIN, TIBC, IRON, RETICCTPCT in the last 72 hours. Urine analysis:    Component Value Date/Time   COLORURINE YELLOW (A) 02/18/2021 0924   APPEARANCEUR HAZY (A) 02/18/2021 0924   APPEARANCEUR Clear 11/04/2013 1649   LABSPEC 1.015 02/18/2021 0924   LABSPEC 1.009 11/04/2013 1649   PHURINE 6.0 02/18/2021 0924   GLUCOSEU NEGATIVE 02/18/2021 0924   GLUCOSEU Negative 11/04/2013 1649   HGBUR SMALL (A) 02/18/2021 0924   BILIRUBINUR NEGATIVE 02/18/2021 0924   BILIRUBINUR Negative 11/04/2013 1649   KETONESUR NEGATIVE 02/18/2021 0924   PROTEINUR NEGATIVE 02/18/2021 0924   NITRITE NEGATIVE 02/18/2021 0924   LEUKOCYTESUR NEGATIVE 02/18/2021 0924   LEUKOCYTESUR Negative 11/04/2013 1649    Radiological Exams on Admission: CT CHEST W CONTRAST  Result  Date: 03/06/2021 CLINICAL DATA:  Respiratory failure, shortness of breath EXAM: CT CHEST WITH CONTRAST TECHNIQUE: Multidetector CT imaging of the chest was performed during intravenous contrast administration. CONTRAST:  76mL OMNIPAQUE IOHEXOL 300 MG/ML  SOLN COMPARISON:  02/27/2021.  Chest x-ray 03/05/2021 FINDINGS: Cardiovascular: Heart is normal size. Coronary artery and aortic calcifications. No aneurysm. Mediastinum/Nodes: Bulky mediastinal and bilateral hilar adenopathy again noted, unchanged. Trachea is patent. Thyroid unremarkable. Lungs/Pleura: Ground-glass airspace opacities in the lower lobes have worsened since prior study, particularly in the left lower lobe. Ground-glass opacities in the upper lobes somewhat improved since prior study. Numerous bilateral pulmonary nodules are stable. Right upper lobe mass again noted and stable. No effusions. Upper Abdomen: Imaging into the upper abdomen demonstrates no acute findings. Musculoskeletal: Chest wall soft tissues are unremarkable. Multiple osseous metastases again noted, unchanged. IMPRESSION: Redemonstrated is extensive metastatic disease in the chest with numerous pulmonary nodules, dominant right upper lobe mass, and bulky mediastinal/bilateral hilar adenopathy. Ground-glass opacities have increased in the lower lobes, left greater than right, likely infectious/inflammatory. Somewhat improvement in ground-glass opacities in the upper lobe since prior study. Stable osseous metastatic disease. Coronary artery disease. Aortic Atherosclerosis (ICD10-I70.0). Electronically Signed   By: Rolm Baptise M.D.   On: 03/06/2021 00:11   DG Chest Portable 1 View  Result Date: 03/07/2021 CLINICAL DATA:  Shortness of breath EXAM: PORTABLE CHEST 1 VIEW COMPARISON:  03/05/2021 FINDINGS: Patchy bilateral airspace disease throughout the lungs, slightly worsened since prior study. Nodular areas within the lungs, stable since prior study. Heart is normal size. No effusions  or acute bony abnormality. IMPRESSION: Worsening patchy bilateral airspace disease, likely worsening pneumonia. Scattered nodules unchanged. Electronically Signed   By: Rolm Baptise M.D.   On: 03/07/2021 21:41     Assessment/Plan 60 year old female with recently diagnosed lung cancer in February 2022 with mets to the brain,, COPD, HTN, depression with anxiety, anemia, HCV hospitalized 3 times in the past month with last 2 hospitalizations from 3/27-3/30, and 4/2-4/3 being for pneumonia, signing out AMA on 4/3 who returns with shortness of breath with EMS reporting O2 sat of 85% on room air    Bilateral pneumonia, recurrent Malignant neoplasm of the lung  Sepsis ([place]) -Patient with 2 recent hospitalizations for pneumonia signing out AMA the day prior -Chest x-ray with bilateral patchy airspace disease likely worsening pneumonia -Covid and flu negative -Sepsis criteria includes tachycardia and tachypnea with leukocytosis in the setting of recent antibiotic treatment -Continue cefepime and vancomycin for healthcare associated pneumonia    COPD exacerbation ([place]) -Wheezing and rhonchi on physical exam -Duo nebs, scheduled and as needed -IV Solu-Medrol    HTN (hypertension) -Continue home antihypertensives    Brain metastases ([place]) -Patient should be on dexamethasone and Keppra but not taking it -Continue Keppra -We will hold dexamethasone as currently on methylprednisolone for COPD exacerbation    DVT prophylaxis: Lovenox  Code Status: full code  Family Communication:  none  Disposition Plan: Back to previous home environment Consults called: none  Status:At the time of admission, it appears that the appropriate admission status for this patient is INPATIENT. This is judged to be reasonable and necessary in order to provide the required intensity of service to ensure the patient's safety given the presenting symptoms, physical exam findings, and initial radiographic and laboratory data  in the context of their  Comorbid conditions.   Patient requires inpatient status due to high intensity of service, high risk for further deterioration and high frequency of surveillance required.   I certify that at  the point of admission it is my clinical judgment that the patient will require inpatient hospital care spanning beyond [place] MD [facility]     03/07/2021, 11:28 PM

## 2021-03-07 NOTE — ED Triage Notes (Signed)
EMS brought pt from home with a chief complaint of SOB. Recently diagnosed with PNA,( within the week ) and  Lung cancer within the month.   Per EMS pt oxygen sat was 85% without oxygen and with oxygen 98% on 6L

## 2021-03-07 NOTE — Consult Note (Signed)
PHARMACY -  BRIEF ANTIBIOTIC NOTE   Pharmacy has received consult(s) for cefepime and vancomycin from an ED provider. The patient's profile has been reviewed for ht/wt/allergies/indication/available labs.    One time order(s) placed for --Cefepime 2 g --Vancomycin 1 g  Further antibiotics/pharmacy consults should be ordered by admitting physician if indicated.                       Thank you, Benita Gutter 03/07/2021  10:17 PM

## 2021-03-07 NOTE — Consult Note (Signed)
NEW PATIENT EVALUATION  Name: Brenda Middleton  MRN: [id number]  Date:   03/07/2021     DOB: [birth year]   This 60 y.o. female patient presents to the clinic for initial evaluation of cranial metastasis and patient with widespread stage IV metastatic lung cancer.  Adenocarcinoma  REFERRING PHYSICIAN: Remi Haggard, FNP  CHIEF COMPLAINT:  Chief Complaint  Patient presents with  . Cancer    Initial consultation    DIAGNOSIS: The encounter diagnosis was Bone metastasis ([place]).   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  She originally presented to the ER for rule out of PE but CT scan showed extensive groundglass airspace opacities throughout lungs.  She had significant adenopathy in the neck as well as a liver lesion.  She was also noted to have a large left occipital metastatic site with protrusion into the brain.  She is currently on steroids.  Ultrasound-guided FNA of cervical node was positive for adenocarcinoma consistent with lung primary.  She is been recently hospitalized secondary to pneumonia.  She is seen today for consideration of palliative radiation therapy to her skull lesion.  She stays she currently is on narcotic analgesics for the pain.  She states her other areas of pain are under good control at this time.  PLANNED TREATMENT REGIMEN: Palliative radiation therapy to skull  PAST MEDICAL HISTORY:  has a past medical history of Anxiety, Benign neoplasm of cervix uteri, Chronic hepatitis C without mention of hepatic coma, Chronic low back pain (08/26/2014), Constipation, Depressive disorder, Displacement of cervical intervertebral disc, Hypertensive disorder,  Iron deficiency anemia due to chronic blood loss (09/12/2019), Palpitations, and Prolapsed cervical intervertebral disc.    PAST SURGICAL HISTORY:  Past Surgical History:  Procedure Laterality Date  . CONIZATION CERVIX    . DILATION AND CURETTAGE OF UTERUS    . HAND SURGERY  2008,2015   Carpel Tunnel  . TONSILLECTOMY    . VULVECTOMY      FAMILY HISTORY: family history includes Breast cancer (age of onset: 3) in her mother; Cancer in her brother and another family member; Lung cancer in her father; Stroke in an other family member.  SOCIAL HISTORY:  reports that she has quit smoking. Her smoking use included cigarettes. She has a 24.50 pack-year smoking history. She has never used smokeless tobacco. She reports current alcohol use. She reports that she does not use drugs.  ALLERGIES: Penicillin g, Penicillin v, and Penicillins  MEDICATIONS:  Current Outpatient Medications  Medication Sig Dispense Refill  . albuterol (VENTOLIN HFA) 108 (90 Base) MCG/ACT inhaler Inhale 2 puffs into the lungs every 4 (four) hours as needed for wheezing or shortness of breath. 6.7 g 1  . budesonide-formoterol (SYMBICORT) 80-4.5 MCG/ACT inhaler Inhale 2 puffs into the lungs 2 (two) times daily.    . busPIRone (BUSPAR) 10 MG tablet Take 10 mg by mouth 2 (two) times daily. Patient is unsure of dose    . cefUROXime (CEFTIN) 250 MG tablet Take 1 tablet (250 mg total) by mouth 2 (two) times daily with a meal. 6 tablet 0  . dexamethasone (DECADRON) 4 MG tablet Take 1 tablet (4 mg total) by mouth 2 (two) times daily. 60 tablet 1  . doxycycline (VIBRA-TABS) 100  MG tablet Take 1 tablet (100 mg total) by mouth 2 (two) times daily. 6 tablet 0  . feeding supplement (ENSURE ENLIVE / ENSURE PLUS) LIQD Take 237 mLs by mouth 3 (three) times daily between meals. 237 mL 12  . fentaNYL (DURAGESIC) 50 MCG/HR Place 1 patch onto the skin every 3 (three) days for 6 days. 2 patch 0  . folic acid (FOLVITE) 1 MG tablet Take 1 tablet  (1 mg total) by mouth daily. Start 5-7 days before Alimta chemotherapy. Continue until 21 days after Alimta completed. 100 tablet 3  . furosemide (LASIX) 20 MG tablet Take 20 mg by mouth 2 (two) times daily.    Marland Kitchen gabapentin (NEURONTIN) 300 MG capsule Take 300 mg by mouth daily.    Marland Kitchen guaiFENesin (MUCINEX) 600 MG 12 hr tablet Take 2 tablets (1,200 mg total) by mouth 2 (two) times daily. 30 tablet 0  . hydrOXYzine (ATARAX/VISTARIL) 25 MG tablet hydroxyzine HCl 25 mg tablet    . levETIRAcetam (KEPPRA) 500 MG tablet Take 1 tablet (500 mg total) by mouth 2 (two) times daily. 60 tablet 2  . lidocaine-prilocaine (EMLA) cream Apply to affected area once 30 g 3  . metoprolol tartrate (LOPRESSOR) 25 MG tablet Take 0.5 tablets (12.5 mg total) by mouth 2 (two) times daily. 30 tablet 3  . ondansetron (ZOFRAN) 8 MG tablet Take 1 tablet (8 mg total) by mouth 2 (two) times daily as needed (Nausea or vomiting). Start if needed on the third day after cisplatin. 30 tablet 1  . ondansetron (ZOFRAN-ODT) 4 MG disintegrating tablet Take 1 tablet (4 mg total) by mouth every 8 (eight) hours as needed. 20 tablet 2  . oxyCODONE-acetaminophen (PERCOCET/ROXICET) 5-325 MG tablet Take 1-2 tablets by mouth every 4 (four) hours as needed for severe pain. 60 tablet 0  . pantoprazole (PROTONIX) 40 MG tablet Take 40 mg by mouth daily.    . prochlorperazine (COMPAZINE) 10 MG tablet Take 10 mg by mouth every 6 (six) hours as needed for nausea or vomiting.    . prochlorperazine (COMPAZINE) 10 MG tablet Take 1 tablet (10 mg total) by mouth every 6 (six) hours as needed (Nausea or vomiting). 30 tablet 1  . promethazine (PHENERGAN) 12.5 MG tablet Take 1 tablet (12.5 mg total) by mouth every 6 (six) hours as needed for nausea or vomiting. 15 tablet 0  . QUEtiapine (SEROQUEL) 400 MG tablet Take 400 mg by mouth at bedtime.     No current facility-administered medications for this encounter.    ECOG PERFORMANCE STATUS:  1 - Symptomatic but  completely ambulatory  REVIEW OF SYSTEMS: Patient denies any weight loss, fatigue, weakness, fever, chills or night sweats. Patient denies any loss of vision, blurred vision. Patient denies any ringing  of the ears or hearing loss. No irregular heartbeat. Patient denies heart murmur or history of fainting. Patient denies any chest pain or pain radiating to her upper extremities. Patient denies any shortness of breath, difficulty breathing at night, cough or hemoptysis. Patient denies any swelling in the lower legs. Patient denies any nausea vomiting, vomiting of blood, or coffee ground material in the vomitus. Patient denies any stomach pain. Patient states has had normal bowel movements no significant constipation or diarrhea. Patient denies any dysuria, hematuria or significant nocturia. Patient denies any problems walking, swelling in the joints or loss of balance. Patient denies any skin changes, loss of hair or loss of weight. Patient denies any excessive worrying or anxiety or significant depression. Patient denies  any problems with insomnia. Patient denies excessive thirst, polyuria, polydipsia. Patient denies any swollen glands, patient denies easy bruising or easy bleeding. Patient denies any recent infections, allergies or URI. Patient "s visual fields have not changed significantly in recent time.   PHYSICAL EXAM: BP (!) 170/83 (BP Location: Right Arm, Patient Position: Sitting)   Pulse 92   Temp 98.2 F (36.8 C) (Tympanic)   Resp 20   Wt 116 lb 3.2 oz (52.7 kg)   SpO2 91%   BMI 18.76 kg/m  Patient has some tenderness in the area of the left occipital bone lesion.  Crude visual fields within normal range motor sensory and DTR levels are equal and symmetric in the upper lower extremities.  Proprioception is intact.  Well-developed well-nourished patient in NAD. HEENT reveals PERLA, EOMI, discs not visualized.  Oral cavity is clear. No oral mucosal lesions are identified. Neck is clear  without evidence of cervical or supraclavicular adenopathy. Lungs are clear to A&P. Cardiac examination is essentially unremarkable with regular rate and rhythm without murmur rub or thrill. Abdomen is benign with no organomegaly or masses noted. Motor sensory and DTR levels are equal and symmetric in the upper and lower extremities. Cranial nerves II through XII are grossly intact. Proprioception is intact. No peripheral adenopathy or edema is identified. No motor or sensory levels are noted. Crude visual fields are within normal range.  LABORATORY DATA: Pathology report reviewed    RADIOLOGY RESULTS: Bone scan CT scans all reviewed compatible with above-stated findings   IMPRESSION: Stage IV adenocarcinoma the lung with multiple areas of metastatic disease including large painful lesion in the left occipital region of the skull with protrusion on the brain in 60 year old female  PLAN: This time is go ahead with palliative radiation therapy to her skull lesion we will plan on delivering 30 Gray in 10 fractions.  I have personally 7 ordered CT simulation risks and benefits of treatment [place] hair loss possible fatigue possible alteration of blood count skin reaction all reviewed with the patient and her sister.  They both seem to comprehend my treatment plan well.  I have personally set up and ordered CT simulation for tomorrow we will try to have treatment started as soon as possible.  I would like to take this opportunity to thank you for allowing me to participate in the care of your patient.Noreene Filbert, MD

## 2021-03-07 NOTE — Progress Notes (Signed)
Hematology/Oncology Follow Up Note [facility]  [phone] [phone]  Patient Care Team: Remi Haggard, FNP as PCP - General (Family Medicine) Remi Haggard, FNP (Family Medicine) Christene Lye, MD (General Surgery) Telford Nab, RN as Oncology Nurse Navigator   Name of the patient: Brenda Middleton  [id number]  1961-03-05   REASON FOR VISIT Metastatic lung adenoacarcinoma  PERTINENT ONCOLOGY HISTORY Brenda Middleton is a 60 y.o.afemale who has above oncology history reviewed by me today presented for follow up visit for management of metastatic lung cancer. 02/15/2021, CT head showed aggressive 5.5 cm mass lesion involving the left occipital calvarium with bony destruction and expansion and extension inferior to involve the left temporal bone and skull base.  Mass-effect on the left cerebellum and possible involvement of the subjacent dural venous sinuses. 02/15/2021, MRI brain confirmed a destructive mass lesion occipital bone bilaterally left greater than right, associated soft tissue mass in the left occipital bone extending to the posterior fossa with mass-effect and mild edema of the left cerebellum.  Mass extends into the left temporal bone.  Subtle enhancing lesion right frontal lobe 10 mm and 4 mm.  Compatible with metastatic disease. 02/15/2021 CT chest abdomen pelvis showed 2.7 cm right middle lobe pulmonary nodule, with extensive lesions lymphadenopathy including axillary node, subpectoral node, left supraclavicular node, right paratracheal node, right precarinal node, right hilar node, scattered pulmonary nodules, multiple liver lesions, pathological fracture of L2 Patient was evaluated by Dr. Lacinda Axon will recommend no surgical intervention.  Recommend biopsy of body lesions.  And radiation 02/18/2021 CT thoracic and lumbar spine today showed a sclerotic and lytic metastatic lesion, pathological L2 fracture with less than 50%  loss of height, probable pathological fracture at T9 without height loss. 02/21/2021 Left supraclavicular lymph node biopsy showed metastatic adenocarcinoma, compatible with lung primary.     INTERVAL HISTORY Brenda Middleton is a 60 y.o. female who has above history reviewed by me today presents for follow up visit for management of metastatic lung adenocarcinoma Problems and complaints are listed below: Patient was started on Dexamethasone, and pain was controlled with fentanyl patch and PRN pertcocet.  She was readmitted recently due to acute COPD exacerbation, acute respiratory failure due to possible HCAP and was treated with antibiotics and discharged last week.  She presents for post hospitalization follow up .  SOB and cough symptoms are stable. Chronic hoarseness.  Accompanied by sister [name] who she livers with.  She is current every day smoker.  No nausea vomiting diarrhea. She is on Dexamethasone and Keppra.    Review of Systems  Constitutional: Negative for appetite change, chills, fatigue and fever.  HENT:   Positive for voice change. Negative for hearing loss.   Eyes: Negative for eye problems.  Respiratory: Positive for cough and shortness of breath. Negative for chest tightness.   Cardiovascular: Negative for chest pain.  Gastrointestinal: Negative for abdominal distention, abdominal pain and blood in stool.  Endocrine: Negative for hot flashes.  Genitourinary: Negative for difficulty urinating and frequency.   Musculoskeletal: Negative for arthralgias.  Skin: Negative for itching and rash.  Neurological: Negative for extremity weakness.  Hematological: Negative for adenopathy.  Psychiatric/Behavioral: Negative for confusion.      Allergies  Allergen Reactions  . Penicillin G Hives    Other reaction(s): HIVES Other reaction(s): HIVES  . Penicillin V Itching  . Penicillins      Past Medical History:  Diagnosis Date  . Anxiety   .  Benign neoplasm of cervix  uteri   . Chronic hepatitis C without mention of hepatic coma   . Chronic low back pain 08/26/2014  . Constipation   . Depressive disorder   . Displacement of cervical intervertebral disc   . Hypertensive disorder   . Iron deficiency anemia due to chronic blood loss 09/12/2019  . Palpitations   . Prolapsed cervical intervertebral disc      Past Surgical History:  Procedure Laterality Date  . CONIZATION CERVIX    . DILATION AND CURETTAGE OF UTERUS    . HAND SURGERY  2008,2015   Carpel Tunnel  . TONSILLECTOMY    . VULVECTOMY      Social History   Socioeconomic History  . Marital status: Single    Spouse name: Not on file  . Number of children: 0  . Years of education: college1  . Highest education level: Not on file  Occupational History    Employer: UNEMPLOYED  Tobacco Use  . Smoking status: Former Smoker    Packs/day: 0.70    Years: 35.00    Pack years: 24.50    Types: Cigarettes  . Smokeless tobacco: Never Used  Vaping Use  . Vaping Use: Never used  Substance and Sexual Activity  . Alcohol use: Yes    Comment: occasional wine  . Drug use: No  . Sexual activity: Not Currently  Other Topics Concern  . Not on file  Social History Narrative  . Not on file   Social Determinants of Health   Financial Resource Strain: Not on file  Food Insecurity: Not on file  Transportation Needs: Not on file  Physical Activity: Not on file  Stress: Not on file  Social Connections: Not on file  Intimate Partner Violence: Not on file    Family History  Problem Relation Age of Onset  . Breast cancer Mother 41  . Lung cancer Father   . Cancer Brother   . Cancer Other   . Stroke Other      Current Outpatient Medications:  .  albuterol (VENTOLIN HFA) 108 (90 Base) MCG/ACT inhaler, Inhale 2 puffs into the lungs every 4 (four) hours as needed for wheezing or shortness of breath., Disp: 6.7 g, Rfl: 1 .  budesonide-formoterol (SYMBICORT) 80-4.5 MCG/ACT inhaler, Inhale 2  puffs into the lungs 2 (two) times daily., Disp: , Rfl:  .  furosemide (LASIX) 20 MG tablet, Take 20 mg by mouth 2 (two) times daily., Disp: , Rfl:  .  gabapentin (NEURONTIN) 300 MG capsule, Take 300 mg by mouth daily., Disp: , Rfl:  .  guaiFENesin (MUCINEX) 600 MG 12 hr tablet, Take 2 tablets (1,200 mg total) by mouth 2 (two) times daily., Disp: 30 tablet, Rfl: 0 .  hydrOXYzine (ATARAX/VISTARIL) 25 MG tablet, hydroxyzine HCl 25 mg tablet, Disp: , Rfl:  .  levETIRAcetam (KEPPRA) 500 MG tablet, Take 1 tablet (500 mg total) by mouth 2 (two) times daily., Disp: 60 tablet, Rfl: 2 .  lidocaine-prilocaine (EMLA) cream, Apply to affected area once, Disp: 30 g, Rfl: 3 .  metoprolol tartrate (LOPRESSOR) 25 MG tablet, Take 0.5 tablets (12.5 mg total) by mouth 2 (two) times daily., Disp: 30 tablet, Rfl: 3 .  pantoprazole (PROTONIX) 40 MG tablet, Take 40 mg by mouth daily., Disp: , Rfl:  .  busPIRone (BUSPAR) 10 MG tablet, Take 1 tablet (10 mg total) by mouth 2 (two) times daily., Disp: 60 tablet, Rfl: 2 .  dexamethasone (DECADRON) 4 MG tablet, Take 1 tablet (  4 mg total) by mouth 2 (two) times daily. (Patient not taking: Reported on 03/07/2021), Disp: 60 tablet, Rfl: 1 .  fentaNYL (DURAGESIC) 50 MCG/HR, Place 1 patch onto the skin every 3 (three) days., Disp: 10 patch, Rfl: 0 .  folic acid (FOLVITE) 1 MG tablet, Take 1 tablet (1 mg total) by mouth daily. Start 5-7 days before Alimta chemotherapy. Continue until 21 days after Alimta completed. (Patient not taking: Reported on 03/07/2021), Disp: 100 tablet, Rfl: 3 .  oxyCODONE-acetaminophen (PERCOCET/ROXICET) 5-325 MG tablet, Take 1-2 tablets by mouth every 4 (four) hours as needed for severe pain., Disp: 60 tablet, Rfl: 0 .  prochlorperazine (COMPAZINE) 10 MG tablet, Take 1 tablet (10 mg total) by mouth every 6 (six) hours as needed (Nausea or vomiting). (Patient not taking: Reported on 03/07/2021), Disp: 30 tablet, Rfl: 1 .  promethazine (PHENERGAN) 12.5 MG tablet,  Take 1 tablet (12.5 mg total) by mouth every 6 (six) hours as needed for nausea or vomiting., Disp: 60 tablet, Rfl: 2 .  QUEtiapine (SEROQUEL) 400 MG tablet, Take 400 mg by mouth at bedtime., Disp: , Rfl:   Physical exam:  Vitals:   03/07/21 1406  BP: (!) 151/131  Pulse: (!) 107  Temp: 98 F (36.7 C)  TempSrc: Tympanic  Weight: 114 lb (51.7 kg)   Physical Exam Constitutional:      General: She is not in acute distress. HENT:     Head: Normocephalic and atraumatic.  Eyes:     General: No scleral icterus. Cardiovascular:     Rate and Rhythm: Normal rate and regular rhythm.     Heart sounds: Normal heart sounds.  Pulmonary:     Effort: Pulmonary effort is normal. No respiratory distress.     Breath sounds: No wheezing.  Abdominal:     General: Bowel sounds are normal. There is no distension.     Palpations: Abdomen is soft.  Musculoskeletal:        General: No deformity. Normal range of motion.     Cervical back: Normal range of motion and neck supple.  Skin:    General: Skin is warm and dry.     Findings: No erythema or rash.  Neurological:     Mental Status: She is alert and oriented to person, place, and time. Mental status is at baseline.     Cranial Nerves: No cranial nerve deficit.     Coordination: Coordination normal.  Psychiatric:        Mood and Affect: Mood normal.     CMP Latest Ref Rng & Units 03/06/2021  Glucose 70 - 99 mg/dL 151(H)  BUN 6 - 20 mg/dL 27(H)  Creatinine 0.44 - 1.00 mg/dL 0.57  Sodium 135 - 145 mmol/L 140  Potassium 3.5 - 5.1 mmol/L 4.4  Chloride 98 - 111 mmol/L 104  CO2 22 - 32 mmol/L 27  Calcium 8.9 - 10.3 mg/dL 8.2(L)  Total Protein 6.5 - 8.1 g/dL 6.2(L)  Total Bilirubin 0.3 - 1.2 mg/dL 0.6  Alkaline Phos 38 - 126 U/L 294(H)  AST 15 - 41 U/L 21  ALT 0 - 44 U/L 32   CBC Latest Ref Rng & Units 03/05/2021  WBC 4.0 - 10.5 K/uL 19.8(H)  Hemoglobin 12.0 - 15.0 g/dL 9.5(L)  Hematocrit 36.0 - 46.0 % 29.0(L)  Platelets 150 - 400 K/uL 214     RADIOGRAPHIC STUDIES: I have personally reviewed the radiological images as listed and agreed with the findings in the report. DG Chest 2 View  Result Date: 03/05/2021 CLINICAL DATA:  Shortness of breath EXAM: CHEST - 2 VIEW COMPARISON:  02/27/2021 FINDINGS: Heart is normal size. Right upper lobe mass again noted as seen on CT. Patchy bilateral airspace disease again noted, similar to prior study. No effusions or acute bony abnormality. IMPRESSION: Patchy bilateral airspace disease again noted, not significantly changed. Right upper lobe mass again seen. Electronically Signed   By: Rolm Baptise M.D.   On: 03/05/2021 21:42   DG Chest 2 View  Result Date: 02/15/2021 CLINICAL DATA:  Weight loss and cough EXAM: CHEST - 2 VIEW COMPARISON:  None. FINDINGS: The heart size and mediastinal contours are within normal limits. Rounded patchy nodular opacity seen within the right upper lobe and right mid lung. The left lung is clear. No acute osseous abnormality. IMPRESSION: Rounded patchy airspace opacities in the right mid lung which may be due to infectious etiology, however cannot exclude metastatic disease. Would recommend CT chest with contrast for further evaluation. Electronically Signed   By: Prudencio Pair M.D.   On: 02/15/2021 12:16   CT Head Wo Contrast  Result Date: 02/15/2021 CLINICAL DATA:  Headache with left-sided face/neck/head pain. On antibiotics for reported laryngitis with bilateral ear pain. EXAM: CT HEAD WITHOUT CONTRAST CT MAXILLOFACIAL WITHOUT CONTRAST TECHNIQUE: Multidetector CT imaging of the head and maxillofacial structures were performed using the standard protocol without intravenous contrast. Multiplanar CT image reconstructions of the maxillofacial structures were also generated. COMPARISON:  None. FINDINGS: CT HEAD FINDINGS Brain: The calvarial/skull base mass lesion is described below. Spur this process exerts mass effect on the left cerebellum with suspected narrowing of the  fourth ventricle. No evidence of hydrocephalus. No evidence of acute large vascular territory infarct. No acute hemorrhage. Mild to moderate scattered white matter hypodensities, which are nonspecific but most likely relate to chronic microvascular ischemic disease. No midline shift. Vascular: No hyperdense vessel identified. Calcific atherosclerosis. Skull/skull base: There is aggressive soft tissue mass lesion involving the left occipital calvarium measuring up to approximately 5.5 x 3.1 by 3.3 cm (transverse by AP by craniocaudal) with bony destruction and expansion and extension inferiorly to involve the left temporal bone and skull base. There is mass effect on the inferior left cerebellum and possible involvement of the subjacent dural venous sinuses. Small left mastoid effusion. CT MAXILLOFACIAL FINDINGS Osseous: No fracture or mandibular dislocation. No destructive process in the face. Orbits: Negative. No traumatic or inflammatory finding. Sinuses: Small right maxillary sinus retention cyst. Otherwise, sinuses are clear. Soft tissues: Negative. 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 Resulting mass effect on the left cerebellum and possible involvement of the subjacent dural venous sinuses. 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 Osteomyelitis is a differential consideration. Recommend MRI brain with and without contrast to further evaluate. Findings and recommendations discussed with Ashok Cordia, PA via telephone at 11:20 a.m. Electronically Signed   By: Margaretha Sheffield MD   On: 02/15/2021 11:29   CT CHEST W CONTRAST  Result Date: 03/06/2021 CLINICAL DATA:  Respiratory failure, shortness of breath EXAM: CT CHEST WITH CONTRAST TECHNIQUE: Multidetector CT imaging of the chest was performed during intravenous  contrast administration. CONTRAST:  96mL OMNIPAQUE IOHEXOL 300 MG/ML  SOLN COMPARISON:  02/27/2021.  Chest x-ray 03/05/2021 FINDINGS: Cardiovascular: Heart is normal size. Coronary artery and aortic calcifications. No aneurysm. Mediastinum/Nodes: Bulky mediastinal and bilateral  hilar adenopathy again noted, unchanged. Trachea is patent. Thyroid unremarkable. Lungs/Pleura: Ground-glass airspace opacities in the lower lobes have worsened since prior study, particularly in the left lower lobe. Ground-glass opacities in the upper lobes somewhat improved since prior study. Numerous bilateral pulmonary nodules are stable. Right upper lobe mass again noted and stable. No effusions. Upper Abdomen: Imaging into the upper abdomen demonstrates no acute findings. Musculoskeletal: Chest wall soft tissues are unremarkable. Multiple osseous metastases again noted, unchanged. IMPRESSION: Redemonstrated is extensive metastatic disease in the chest with numerous pulmonary nodules, dominant right upper lobe mass, and bulky mediastinal/bilateral hilar adenopathy. Ground-glass opacities have increased in the lower lobes, left greater than right, likely infectious/inflammatory. Somewhat improvement in ground-glass opacities in the upper lobe since prior study. Stable osseous metastatic disease. Coronary artery disease. Aortic Atherosclerosis (ICD10-I70.0). Electronically Signed   By: Rolm Baptise M.D.   On: 03/06/2021 00:11   CT Angio Chest PE W and/or Wo Contrast  Result Date: 02/27/2021 CLINICAL DATA:  PE suspected, widely metastatic lung cancer EXAM: CT ANGIOGRAPHY CHEST WITH CONTRAST TECHNIQUE: Multidetector CT imaging of the chest was performed using the standard protocol during bolus administration of intravenous contrast. Multiplanar CT image reconstructions and MIPs were obtained to evaluate the vascular anatomy. CONTRAST:  53mL OMNIPAQUE IOHEXOL 350 MG/ML SOLN COMPARISON:  02/15/2021 FINDINGS: Cardiovascular: Satisfactory  opacification of the pulmonary arteries to the segmental level. No evidence of pulmonary embolism. Cardiomegaly. Left and right coronary artery calcifications and stents. No pericardial effusion. Mediastinum/Nodes: Redemonstrated, very extensive bulky bilateral hilar, mediastinal, supraclavicular, and left axillary lymphadenopathy. Thyroid gland, trachea, and esophagus demonstrate no significant findings. Lungs/Pleura: Diffuse bilateral bronchial wall thickening. Spiculated right upper lobe mass as seen on prior examination (series 7, image 42). Numerous redemonstrated small pulmonary nodules throughout the lungs. There is new, extensive ground-glass airspace opacity throughout the lungs, somewhat geographic in the upper lobes (series 7, image 33), although somewhat more nodular appearing in the lower lungs (series 7, image 58). No pleural effusion or pneumothorax. Upper Abdomen: No acute abnormality. Multiple low-attenuation lesions of liver, better assessed by prior dedicated of the abdomen. Musculoskeletal: No chest wall abnormality. Multiple osseous metastatic lesions, most significantly a lytic lesion of the T9 vertebral body (series 9, image 58). Review of the MIP images confirms the above findings. IMPRESSION: 1. Negative examination for pulmonary embolism. 2. There is new, extensive ground-glass airspace opacity throughout the lungs, somewhat geographic in the upper lobes, although somewhat more nodular appearing in the lower lungs. Findings are nonspecific and infectious or inflammatory, differential considerations primarily include drug toxicity and atypical/viral infection. 3. Diffuse bilateral bronchial wall thickening, consistent with nonspecific infectious or inflammatory bronchitis. 4. Redemonstrated findings of advanced metastatic lung malignancy, better assessed by recent prior staging examination. 5. Coronary artery disease. Electronically Signed   By: Eddie Candle M.D.   On: 02/27/2021 10:49    CT Thoracic Spine Wo Contrast  Result Date: 02/18/2021 CLINICAL DATA:  Metastases on CT abdomen EXAM: CT THORACIC AND LUMBAR SPINE WITHOUT CONTRAST TECHNIQUE: Multidetector CT imaging of the thoracic and lumbar spine was performed without contrast. Multiplanar CT image reconstructions were also generated. COMPARISON:  None. FINDINGS: CT THORACIC SPINE FINDINGS Alignment: Preserved. Vertebrae: There is a small partially sclerotic lesion at the right aspect of the T5 vertebral body. A lytic lesion is present at the anterior aspect T9 likely with pathologic fracture but no substantial height loss. Ill-defined lucent lesions are present elsewhere. Paraspinal and other soft tissues: Better evaluated on prior dedicated imaging. Disc  levels: Multilevel degenerative changes. No high-grade degenerative canal narrowing. CT LUMBAR SPINE FINDINGS Segmentation: 5 lumbar type vertebrae. Alignment: Dextrocurvature.  Grade 1 anterolisthesis at L4-L5 Vertebrae: Lucency and sclerosis at L2 with pathologic compression fracture resulting in less than 50% loss of height at the superior endplate. Sclerotic lesion of the right sacrum. Paraspinal and other soft tissues: Better evaluated on prior dedicated imaging. Disc levels: Multilevel degenerative changes. Canal stenosis is greatest at L3-L4 and L4-L5. Foraminal narrowing is greatest on the right at L5-S1. IMPRESSION: Sclerotic and lytic metastatic lesions as already identified on the CT chest, abdomen, and pelvis. Pathologic L2 fracture with less than 50% loss of height. Probable pathologic fracture at T9 without height loss. No apparent significant epidural disease by CT. Electronically Signed   By: Macy Mis M.D.   On: 02/18/2021 10:53   CT Lumbar Spine Wo Contrast  Result Date: 02/18/2021 CLINICAL DATA:  Metastases on CT abdomen EXAM: CT THORACIC AND LUMBAR SPINE WITHOUT CONTRAST TECHNIQUE: Multidetector CT imaging of the thoracic and lumbar spine was performed  without contrast. Multiplanar CT image reconstructions were also generated. COMPARISON:  None. FINDINGS: CT THORACIC SPINE FINDINGS Alignment: Preserved. Vertebrae: There is a small partially sclerotic lesion at the right aspect of the T5 vertebral body. A lytic lesion is present at the anterior aspect T9 likely with pathologic fracture but no substantial height loss. Ill-defined lucent lesions are present elsewhere. Paraspinal and other soft tissues: Better evaluated on prior dedicated imaging. Disc levels: Multilevel degenerative changes. No high-grade degenerative canal narrowing. CT LUMBAR SPINE FINDINGS Segmentation: 5 lumbar type vertebrae. Alignment: Dextrocurvature.  Grade 1 anterolisthesis at L4-L5 Vertebrae: Lucency and sclerosis at L2 with pathologic compression fracture resulting in less than 50% loss of height at the superior endplate. Sclerotic lesion of the right sacrum. Paraspinal and other soft tissues: Better evaluated on prior dedicated imaging. Disc levels: Multilevel degenerative changes. Canal stenosis is greatest at L3-L4 and L4-L5. Foraminal narrowing is greatest on the right at L5-S1. IMPRESSION: Sclerotic and lytic metastatic lesions as already identified on the CT chest, abdomen, and pelvis. Pathologic L2 fracture with less than 50% loss of height. Probable pathologic fracture at T9 without height loss. No apparent significant epidural disease by CT. Electronically Signed   By: Macy Mis M.D.   On: 02/18/2021 10:53   MR Brain W and Wo Contrast  Result Date: 02/15/2021 CLINICAL DATA:  Abnormal head CT.  Skull base mass. EXAM: MRI HEAD WITHOUT AND WITH CONTRAST TECHNIQUE: Multiplanar, multiecho pulse sequences of the brain and surrounding structures were obtained without and with intravenous contrast. CONTRAST:  83mL GADAVIST GADOBUTROL 1 MMOL/ML IV SOLN COMPARISON:  CT head 02/15/2021 FINDINGS: Brain: Large destructive mass in the posterior skull base bilaterally left greater than  right. This involves the occipital bone with extension into the left temporal bone. The occipital bone is significantly expanded due to tumor on the left with mass-effect on the left cerebellum. Soft tissue mass associated with left occipital lesion measures approximately 5.2 x 3.0 cm. Mild edema in the left cerebellum. There is infiltrating tumor in the left temporal bone which is more sizable than would be predicted from the CT. The soft tissue mass associated with the left occipital bone shows susceptibility which may be due to mineralization and/or bone fragments from bone destruction. Ventricle size normal. No midline shift. Moderate white matter changes likely due to chronic microvascular ischemia. No acute infarct. Chronic microhemorrhage in the left parietal lobe. Subtle enhancing lesions in the brain in  the right frontal lobe measuring approximately 10 mm, and 4 mm best seen on coronal and sagittal postcontrast imaging. This is suspicious for metastatic disease. Vascular: Normal arterial flow voids. Normal venous enhancement. No evidence of venous sinus thrombosis. Skull and upper cervical spine: Infiltrating destructive mass in the occipital bone bilaterally left greater than right with extension into the left temporal bone. Additional lesions in the frontal bone bilaterally and in the left parietal bone likely metastatic disease. Sinuses/Orbits: Mild mucosal edema paranasal sinuses. Negative orbit Other: None IMPRESSION: Destructive mass lesion in the occipital bone bilaterally left greater than right. There is associated soft tissue mass in the left occipital bone extending into the posterior fossa with mass-effect and mild edema of the left cerebellum. This mass extends into the left temporal bone. Additional bone lesions are present in the frontal bones and left parietal bone. Subtle enhancing lesions right frontal lobe measuring 10 mm and 4 mm. Findings compatible with metastatic disease.  Electronically Signed   By: Franchot Gallo M.D.   On: 02/15/2021 13:47   NM Bone Scan Whole Body  Result Date: 02/27/2021 CLINICAL DATA:  Metastatic disease evaluation, lung mass, abnormal CT EXAM: NUCLEAR MEDICINE WHOLE BODY BONE SCAN TECHNIQUE: Whole body anterior and posterior images were obtained approximately 3 hours after intravenous injection of radiopharmaceutical. RADIOPHARMACEUTICALS:  19.79 mCi Technetium-28m MDP IV COMPARISON:  None Correlation: CT chest abdomen pelvis 02/15/2021, CT thoracic and lumbar spine 02/18/2021, CT head 02/15/2021 FINDINGS: Multiple sites of abnormal tracer uptake are identified consistent with widespread osseous metastatic disease. These include calvaria greatest at occipital bone growth more on LEFT, thoracic and lumbar spine, RIGHT ribs, and pelvis. Dextroconvex thoracolumbar scoliosis. No definite abnormal tracer uptake within the humeri or femora. Expected urinary tract and soft tissue distribution of tracer. IMPRESSION: Scattered osseous metastases as above. Electronically Signed   By: Lavonia Dana M.D.   On: 02/27/2021 18:17   US Renal  Result Date: 02/18/2021 CLINICAL DATA:  Back pain. Right renal pelvis fullness on right upper quadrant ultrasound performed 1 day prior EXAM: RENAL / URINARY TRACT ULTRASOUND COMPLETE COMPARISON:  02/17/2021 abdominal sonogram. FINDINGS: Right Kidney: Renal measurements: 10.3 x 4.2 x 4.2 cm = volume: 95 mL. Normal parenchymal echogenicity and thickness. Mild right hydronephrosis. Possible 5 mm right ureteropelvic junction stone. Nonobstructing 3 mm lower right renal stone. No renal masses. Left Kidney: Renal measurements: 10.0 x 4.8 x 4.2 cm = volume: 107 mL. Normal parenchymal echogenicity and thickness. No left hydronephrosis. No renal masses. Probable nonobstructing 7 mm upper left renal stone. Bladder: Appears normal for degree of bladder distention. Bilateral ureteral jets seen in the bladder. Other: None. IMPRESSION: 1. Mild  right hydronephrosis. Possible 5 mm right UPJ stone. Nonobstructing 3 mm lower right renal stone. Suggest unenhanced CT abdomen/pelvis for further evaluation. 2. Probable nonobstructing upper left renal stone. Electronically Signed   By: Ilona Sorrel M.D.   On: 02/18/2021 12:02   CT CHEST ABDOMEN PELVIS W CONTRAST  Result Date: 02/15/2021 CLINICAL DATA:  Altered mental status. 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 EXAM: CT CHEST, ABDOMEN, AND PELVIS WITH CONTRAST TECHNIQUE: Multidetector CT imaging of the chest, abdomen and pelvis was performed following the standard protocol during bolus administration of intravenous contrast. CONTRAST:  75 mL OMNIPAQUE IOHEXOL 300 MG/ML  SOLN COMPARISON:  None. FINDINGS: CT CHEST FINDINGS Cardiovascular: No significant vascular findings. Normal heart size. No pericardial effusion. Mediastinum/Nodes: The patient has extensive lymphadenopathy. A 2 cm left axillary node is  seen on image 11 of series 2; a second left axillary node on image 14 measures 1.4 cm. A 1.1 cm subpectoral node is seen on the left on image 15 of series 2. A 1.4 cm left supraclavicular node is seen on image 9. Right paratracheal node on image 15 measures 1.9 cm. Right precarinal nodal mass on image 25 measures 2.6 cm and there is a right hilar nodal mass measuring 4.2 x 3.1 cm on image 30. The esophagus and thyroid are negative. Lungs/Pleura: Small right pleural effusion. A spiculated nodule in the right middle lobe measures 2.6 cm transverse by 2.7 cm AP on image 77, series 4. Scattered pulmonary nodules include a 0.5 cm nodule in the superior segment of the left lower lobe on image 80, 0.4 cm left upper lobe nodule on image 76 and 2 punctate nodules on image 83. Additional smaller nodules are seen scattered through both lungs. The lungs are emphysematous. No pleural effusion. Musculoskeletal: A 0.9 cm lytic lesion is seen in T5, lytic lesions are seen in T7 and T9, larger in T9. There  is also a small lytic lesion in T10. CT ABDOMEN PELVIS FINDINGS Hepatobiliary: Metastatic lesions are seen in the left hepatic lobe measuring 1.7 cm on image 57, series 2 and near the dome of the liver on image 50 of series 2 where a 1.1 cm lesion is identified. A second lesion near the dome of the liver in the right hepatic lobe on image 50 measures 0.7 cm. There is mild intra and extrahepatic biliary ductal dilatation likely related to prior cholecystectomy. Pancreas: Unremarkable. No pancreatic ductal dilatation or surrounding inflammatory changes. Spleen: Normal in size without focal abnormality. Adrenals/Urinary Tract: Adrenal glands are unremarkable. Kidneys are normal, without renal calculi, focal lesion, or hydronephrosis. Bladder is unremarkable. Stomach/Bowel: Stomach is within normal limits. Appendix appears normal. No evidence of bowel wall thickening, distention, or inflammatory changes. Moderately large colonic stool burden throughout noted. Vascular/Lymphatic: Aortic atherosclerosis. No aneurysm. No pathologic lymphadenopathy by CT size criteria. Reproductive: Status post hysterectomy. No adnexal masses. Other: None. Musculoskeletal: Mottled appearance of the L2 vertebral body is consistent with metastatic disease. There is a mild pathologic superior endplate compression fracture of L2 with vertebral body height loss centrally of approximately 30%. IMPRESSION: Findings consistent with extensive metastatic carcinoma likely emanating from a 2.7 cm right middle lobe pulmonary nodule. Metastases include extensive lymphadenopathy in the chest,, pulmonary nodule bone lesions and liver lesions. Mild pathologic fracture of L2 with vertebral body height loss centrally of up to approximately 30% is again noted. Aortic Atherosclerosis (ICD10-I70.0) and Emphysema (ICD10-J43.9). Electronically Signed   By: Inge Rise M.D.   On: 02/15/2021 14:25   DG Chest Port 1 View  Result Date: 02/27/2021 CLINICAL  DATA:  Worsening shortness of breath since this morning. Recent diagnosis of lung cancer. EXAM: PORTABLE CHEST 1 VIEW COMPARISON:  Radiograph earlier today.  CT earlier today. FINDINGS: Stable heart size and mediastinal contours. Right hilar prominence and thickening of the lower paratracheal stripe related to known adenopathy. Unchanged right mid lung pulmonary nodule. Mild patchy bilateral suprahilar opacities corresponding to ground-glass opacity on CT earlier today. No significant change in the interim. No pneumothorax or large pleural effusion. Retained excreted IV contrast in the right renal collecting system with right hydronephrosis, partially included in the upper abdomen. IMPRESSION: 1. Stable radiographic appearance of the chest from earlier today. Similar patchy suprahilar opacities corresponding to ground-glass opacity on CT. 2. Right mid lung pulmonary nodule and thoracic adenopathy.  3. Partially included in the upper abdomen is retained excreted contrast in the right renal collecting system with right hydronephrosis. Right hydronephrosis was seen on renal ultrasound 02/18/2021. Electronically Signed   By: Keith Rake M.D.   On: 02/27/2021 15:04   DG Chest Portable 1 View  Result Date: 02/27/2021 CLINICAL DATA:  60 year old female with shortness of breath and cough. Metastatic lung cancer. EXAM: PORTABLE CHEST 1 VIEW COMPARISON:  02/15/2021 and prior studies FINDINGS: New hazy opacities within the central/upper lungs noted. RIGHT middle lobe mass and small scattered pulmonary nodules bilaterally are again identified. No pleural effusion or pneumothorax. No acute bony abnormalities are identified. IMPRESSION: New hazy opacities within the central/upper lungs which may represent edema or infection. Unchanged RIGHT middle lobe mass and bilateral pulmonary nodules compatible with known malignancy/metastases. Electronically Signed   By: Margarette Canada M.D.   On: 02/27/2021 08:49   [place] CORE BIOPSY  (LYMPH NODES)  Result Date: 02/21/2021 INDICATION: Multiple enlarged lymph nodes, right middle lobe lung mass, liver lesions and bone lesions. EXAM: ULTRASOUND GUIDED CORE BIOPSY OF LEFT SUPRACLAVICULAR LYMPH NODE MEDICATIONS: None. ANESTHESIA/SEDATION: Versed 2.0 mg IV Moderate Sedation Time:  12 minutes. The patient was continuously monitored during the procedure by the interventional radiology nurse under my direct supervision. PROCEDURE: The procedure, risks, benefits, and alternatives were explained to the patient. Questions regarding the procedure were encouraged and answered. The patient understands and consents to the procedure. A time-out was performed prior to initiating the procedure. The left neck was prepped with chlorhexidine in a sterile fashion, and a sterile drape was applied covering the operative field. A sterile gown and sterile gloves were used for the procedure. Local anesthesia was provided with 1% Lidocaine. After localizing an enlarged left supraclavicular lymph node by ultrasound, multiple 18 gauge core biopsy samples were obtained. Core biopsy samples were submitted in formalin as well as on saline soaked Telfa gauze. Additional ultrasound was performed. COMPLICATIONS: None immediate. FINDINGS: An enlarged left supraclavicular lymph node measures approximately 3.0 x 1.8 x 2.9 cm. Solid core biopsy samples were obtained. IMPRESSION: Ultrasound-guided core biopsy performed of an enlarged left supraclavicular lymph node measuring 3 cm in greatest dimensions. Electronically Signed   By: Aletta Edouard M.D.   On: 02/21/2021 15:31   CT Maxillofacial Wo Contrast  Result Date: 02/15/2021 CLINICAL DATA:  Headache with left-sided face/neck/head pain. On antibiotics for reported laryngitis with bilateral ear pain. EXAM: CT HEAD WITHOUT CONTRAST CT MAXILLOFACIAL WITHOUT CONTRAST TECHNIQUE: Multidetector CT imaging of the head and maxillofacial structures were performed using the standard  protocol without intravenous contrast. Multiplanar CT image reconstructions of the maxillofacial structures were also generated. COMPARISON:  None. FINDINGS: CT HEAD FINDINGS Brain: The calvarial/skull base mass lesion is described below. Spur this process exerts mass effect on the left cerebellum with suspected narrowing of the fourth ventricle. No evidence of hydrocephalus. No evidence of acute large vascular territory infarct. No acute hemorrhage. Mild to moderate scattered white matter hypodensities, which are nonspecific but most likely relate to chronic microvascular ischemic disease. No midline shift. Vascular: No hyperdense vessel identified. Calcific atherosclerosis. Skull/skull base: There is aggressive soft tissue mass lesion involving the left occipital calvarium measuring up to approximately 5.5 x 3.1 by 3.3 cm (transverse by AP by craniocaudal) with bony destruction and expansion and extension inferiorly to involve the left temporal bone and skull base. There is mass effect on the inferior left cerebellum and possible involvement of the subjacent dural venous sinuses. Small left mastoid effusion.  CT MAXILLOFACIAL FINDINGS Osseous: No fracture or mandibular dislocation. No destructive process in the face. Orbits: Negative. No traumatic or inflammatory finding. Sinuses: Small right maxillary sinus retention cyst. Otherwise, sinuses are clear. Soft tissues: Negative. 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 Resulting mass effect on the left cerebellum and possible involvement of the subjacent dural venous sinuses. 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 Osteomyelitis is a differential consideration. Recommend MRI brain with and without contrast to further evaluate. Findings and recommendations discussed with  Ashok Cordia, PA via telephone at 11:20 a.m. Electronically Signed   By: Margaretha Sheffield MD   On: 02/15/2021 11:29   US Abdomen Limited RUQ (LIVER/GB)  Result Date: 02/17/2021 CLINICAL DATA:  Nausea and vomiting for 1 day. EXAM: ULTRASOUND ABDOMEN LIMITED RIGHT UPPER QUADRANT COMPARISON:  CT chest, abdomen and pelvis 02/15/2021. FINDINGS: Gallbladder: Removed. Common bile duct: Diameter: 0.8 cm. Liver: Three hypoechoic liver lesions measuring up to 1.8 cm are identified are identified and correlate with metastatic deposit seen on prior CT. Mild intrahepatic biliary ductal dilatation seen on CT is also noted. Portal vein is patent on color Doppler imaging with normal direction of blood flow towards the liver. Other: There is fullness of the right renal pelvis which is new since the patient's CT scan yesterday. IMPRESSION: Three hypoechoic liver lesions measuring up to 1.8 cm correlate with metastatic deposit seen on CT. Status post cholecystectomy. Mild intra and extrahepatic biliary ductal dilatation is likely related to cholecystectomy. No obstructing lesion is identified. Fullness of the right renal pelvis is new since yesterday's examination. This could be incidental. Correlation with dedicated renal ultrasound could be used for further evaluation if indicated. Electronically Signed   By: Inge Rise M.D.   On: 02/17/2021 12:53     Assessment and plan  1. Malignant neoplasm of lung, unspecified laterality, unspecified part of lung ([place])   2. Goals of care, counseling/discussion   3. Brain metastases ([place])   4. Anemia, unspecified type   5. Neoplasm related pain   Cancer Staging Primary malignant neoplasm of lung metastatic to other site [facility] Staging form: Lung, AJCC 8th Edition - Clinical: Stage IVB (cT4, cN3, cM1c) - Signed by Earlie Server, MD on 03/03/2021  # Metastatic lung adenocarcinoma with bone, brain metastasis. Image findings and pathology reports were discussed with patient. The  diagnosis and care plan were discussed with patient in detail.  NCCN guidelines were reviewed and shared with patient.   The goal of treatment which is to palliate disease, disease related symptoms, improve quality of life and hopefully prolong life was highlighted in our discussion.  Chemotherapy education was provided.  We had discussed the composition of chemotherapy regimen, length of chemo cycle, duration of treatment and the time to assess response to treatment.    I explained to the patient the risks and benefits of chemotherapy including all but not limited to hair loss, mouth sore, nausea, vomiting, diarrhea, low blood counts, bleeding, neuropathy and risk of life threatening infection and even death, secondary malignancy etc.  I discussed the mechanism of action and rationale of using immunotherapy.  The goal of therapy is palliative; and length of treatments are likely ongoing/based upon the results of the scans. Discussed the potential side effects of immunotherapy including but not limited to diarrhea; skin rash; respiratory failure, kidney failure, mental status  change, elevated LFTs/liver failure,endocrine abnormalities, acute deterioration  and even death,etc. patient voices understanding and agrees with proceeding with treatment.  V12 injection x 1 today. Folic acid 1mg  daily.   # Chemotherapy education;Medi- port placement. Antiemetics-Zofran and Compazine; EMLA cream sent to pharmacy # Brain metastasis,  Continue Dexamethasone 4mg  BID. Keppa 500mg  BID.  She has established care with [facility] for brain radiation,  Supportive care measures are necessary for patient well-being and will be provided as necessary. We spent sufficient time to discuss many aspect of care, questions were answered to patient's satisfaction.  Follow up in 1 week for Lab MD Carboplatin and Alimta.  Earlie Server, MD, PhD Hematology Oncology [facility] Pager-  [phone] 03/07/2021

## 2021-03-08 ENCOUNTER — Ambulatory Visit: Payer: Medicaid Other

## 2021-03-08 ENCOUNTER — Encounter: Payer: Self-pay | Admitting: Internal Medicine

## 2021-03-08 ENCOUNTER — Telehealth: Payer: Self-pay | Admitting: *Deleted

## 2021-03-08 DIAGNOSIS — C3490 Malignant neoplasm of unspecified part of unspecified bronchus or lung: Secondary | ICD-10-CM

## 2021-03-08 LAB — BASIC METABOLIC PANEL
Anion gap: 9 (ref 5–15)
BUN: 30 mg/dL — ABNORMAL HIGH (ref 6–20)
CO2: 25 mmol/L (ref 22–32)
Calcium: 8 mg/dL — ABNORMAL LOW (ref 8.9–10.3)
Chloride: 106 mmol/L (ref 98–111)
Creatinine, Ser: 0.71 mg/dL (ref 0.44–1.00)
GFR, Estimated: >60 mL/min (ref >60)
Glucose, Bld: 181 mg/dL — ABNORMAL HIGH (ref 70–99)
Potassium: 4.4 mmol/L (ref 3.5–5.1)
Sodium: 140 mmol/L (ref 135–145)

## 2021-03-08 LAB — PROCALCITONIN: Procalcitonin: 0.12 ng/mL

## 2021-03-08 LAB — CBC
HCT: 26.6 % — ABNORMAL LOW (ref 36.0–46.0)
Hemoglobin: 8.5 g/dL — ABNORMAL LOW (ref 12.0–15.0)
MCH: 28.4 pg (ref 26.0–34.0)
MCHC: 32 g/dL (ref 30.0–36.0)
MCV: 89 fL (ref 80.0–100.0)
Platelets: 137 10*3/uL — ABNORMAL LOW (ref 150–400)
RBC: 2.99 MIL/uL — ABNORMAL LOW (ref 3.87–5.11)
RDW: 15.7 % — ABNORMAL HIGH (ref 11.5–15.5)
WBC: 18.7 10*3/uL — ABNORMAL HIGH (ref 4.0–10.5)
nRBC: 0 % (ref 0.0–0.2)

## 2021-03-08 LAB — PROTIME-INR
INR: 1.1 (ref 0.8–1.2)
Prothrombin Time: 13.6 seconds (ref 11.4–15.2)

## 2021-03-08 LAB — HIV ANTIBODY (ROUTINE TESTING W REFLEX): HIV Screen 4th Generation wRfx: NONREACTIVE

## 2021-03-08 LAB — TROPONIN I (HIGH SENSITIVITY): Troponin I (High Sensitivity): 159 ng/L (ref <18)

## 2021-03-08 LAB — CORTISOL-AM, BLOOD: Cortisol - AM: 3.9 ug/dL — ABNORMAL LOW (ref 6.7–22.6)

## 2021-03-08 MED ORDER — IPRATROPIUM-ALBUTEROL 0.5-2.5 (3) MG/3ML IN SOLN
3.0000 mL | Freq: Four times a day (QID) | RESPIRATORY_TRACT | 3 refills | Status: DC | PRN
Start: 2021-03-08 — End: 2021-03-22

## 2021-03-08 MED ORDER — VANCOMYCIN HCL 750 MG/150ML IV SOLN: 750.0000 mg | INTRAVENOUS | Status: DC

## 2021-03-08 NOTE — Telephone Encounter (Signed)
Received call from pt's sister that pt would like to cancel her appt today for CT simulation. Advised pt's sister that she needs to come in today so she can start treatment as soon as possible and that cancelling her appt today would delay her treatment. Informed that while pt is here could give IV fluids, breathing treatment, and check oxygen levels to order home oxygen. Pt adamantly refused to come in today. Informed pt's sister that if she changes her mind to let me know so we can get her an appt for [facility]. Advised to keep appts as scheduled for chemo ed on Thurs and follow up to start chemo on Monday 4/11. Reviewed with sister that starting treatment without delay will possibly help overall survival and reduce side effects she is currently experiencing. Made aware that more delays will cause the cancer to continue to grow and cause more side effect to the point that treatment may not be beneficial. Pt's sister verbalized understanding and stated that pt has been made aware as well.

## 2021-03-08 NOTE — Discharge Summary (Addendum)
[facility] Physician Discharge Summary  Brenda Middleton UDJ:[id number] DOB: 29-May-1961 DOA: 03/07/2021  PCP: Remi Haggard, FNP  Admit date: 03/07/2021 Discharge date: 03/08/2021  AMA DISCHARGE SUMMARY  Brief/Interim Summary: Brenda Middleton is a 60 y.o. female with medical history significant for Recently diagnosed lung cancer in February 2022 with mets to the brain,, COPD, HTN, depression with anxiety, anemia, HCV hospitalized 3 times in the past month with last 2 hospitalizations from 3/27-3/30, and 4/2-4/3 being for pneumonia, signing out AMA on 4/3 who returns to the emergency room with persistent shortness of breath and cough.  She denies chest pain or fever.  Has no nausea or vomiting.  EMS reports an O2 sat of 85% on room air, improving to 98% on 6 L. ED course: On arrival, afebrile but tachycardic at 120 with O2 sat 96% on 6 L.  BP 127/79.  Blood work significant for leukocytosis of 21,000, hemoglobin 9.2, lactic acid 1.6.  Troponin I 87, BNP 110.  Procalcitonin 0.18. EKG, reviewed and interpreted myself: Sinus tach at 119 with nonspecific ST-T wave changes Imaging: Chest x-ray worsening bilateral pneumonia  Patient was admitted for : Recurrent bilateral pneumonia with sepsis and COPD exacerbation in the setting of malignant neoplasm of the lung and noncompliance with prior treatment for pneumonia  Patient decided to sign out [place] according to note review at 8:20 AM.  According to review of notes, nursing staff spoke with 'Admit MD'.   This summary is written based only on review of documentation only due to a change of shift at 7am prior to [facility] request at 8:26am and will be done as a 'no-charge' note.  Discharge Diagnoses:  Principal Problem:   Bilateral pneumonia, recurrent Active Problems:   Sepsis ([place])   Acute respiratory failure with hypoxia (H[facility])   COPD exacerbation ([facility])   HTN (hypertension)   Malignant neoplasm of lung ([place])   Brain metastases  ([place])    Discharge Instructions   Allergies as of 03/08/2021      Reactions   Penicillin G Hives   Other reaction(s): HIVES Other reaction(s): HIVES   Penicillin V Itching   Penicillins       Medication List    ASK your doctor about these medications   albuterol 108 (90 Base) MCG/ACT inhaler Commonly known as: VENTOLIN HFA Inhale 2 puffs into the lungs every 4 (four) hours as needed for wheezing or shortness of breath.   budesonide-formoterol 80-4.5 MCG/ACT inhaler Commonly known as: SYMBICORT Inhale 2 puffs into the lungs 2 (two) times daily.   busPIRone 10 MG tablet Commonly known as: BUSPAR Take 1 tablet (10 mg total) by mouth 2 (two) times daily.   dexamethasone 4 MG tablet Commonly known as: DECADRON Take 1 tablet (4 mg total) by mouth 2 (two) times daily.   fentaNYL 50 MCG/HR Commonly known as: [place] 1 patch onto the skin every 3 (three) days.   folic acid 1 MG tablet Commonly known as: FOLVITE Take 1 tablet (1 mg total) by mouth daily. Start 5-7 days before Alimta chemotherapy. Continue until 21 days after Alimta completed.   furosemide 20 MG tablet Commonly known as: LASIX Take 20 mg by mouth 2 (two) times daily.   gabapentin 300 MG capsule Commonly known as: NEURONTIN Take 300 mg by mouth daily.   guaiFENesin 600 MG 12 hr tablet Commonly known as: MUCINEX Take 2 tablets (1,200 mg total) by mouth 2 (two) times daily.   hydrOXYzine 25 MG tablet Commonly  known as: ATARAX/VISTARIL hydroxyzine HCl 25 mg tablet   levETIRAcetam 500 MG tablet Commonly known as: KEPPRA Take 1 tablet (500 mg total) by mouth 2 (two) times daily.   lidocaine-prilocaine cream Commonly known as: EMLA Apply to affected area once   metoprolol tartrate 25 MG tablet Commonly known as: LOPRESSOR Take 0.5 tablets (12.5 mg total) by mouth 2 (two) times daily.   oxyCODONE-acetaminophen 5-325 MG tablet Commonly known as: PERCOCET/ROXICET Take 1-2 tablets by mouth  every 4 (four) hours as needed for severe pain.   pantoprazole 40 MG tablet Commonly known as: PROTONIX Take 40 mg by mouth daily.   prochlorperazine 10 MG tablet Commonly known as: COMPAZINE Take 1 tablet (10 mg total) by mouth every 6 (six) hours as needed (Nausea or vomiting).   promethazine 12.5 MG tablet Commonly known as: PHENERGAN Take 1 tablet (12.5 mg total) by mouth every 6 (six) hours as needed for nausea or vomiting.   QUEtiapine 400 MG tablet Commonly known as: SEROQUEL Take 400 mg by mouth at bedtime.       Allergies  Allergen Reactions  . Penicillin G Hives    Other reaction(s): HIVES Other reaction(s): HIVES  . Penicillin V Itching  . Penicillins      Procedures/Studies: DG Chest 2 View  Result Date: 03/05/2021 CLINICAL DATA:  Shortness of breath EXAM: CHEST - 2 VIEW COMPARISON:  02/27/2021 FINDINGS: Heart is normal size. Right upper lobe mass again noted as seen on CT. Patchy bilateral airspace disease again noted, similar to prior study. No effusions or acute bony abnormality. IMPRESSION: Patchy bilateral airspace disease again noted, not significantly changed. Right upper lobe mass again seen. Electronically Signed   By: Rolm Baptise M.D.   On: 03/05/2021 21:42   DG Chest 2 View  Result Date: 02/15/2021 CLINICAL DATA:  Weight loss and cough EXAM: CHEST - 2 VIEW COMPARISON:  None. FINDINGS: The heart size and mediastinal contours are within normal limits. Rounded patchy nodular opacity seen within the right upper lobe and right mid lung. The left lung is clear. No acute osseous abnormality. IMPRESSION: Rounded patchy airspace opacities in the right mid lung which may be due to infectious etiology, however cannot exclude metastatic disease. Would recommend CT chest with contrast for further evaluation. Electronically Signed   By: Prudencio Pair M.D.   On: 02/15/2021 12:16   CT Head Wo Contrast  Result Date: 02/15/2021 CLINICAL DATA:  Headache with left-sided  face/neck/head pain. On antibiotics for reported laryngitis with bilateral ear pain. EXAM: CT HEAD WITHOUT CONTRAST CT MAXILLOFACIAL WITHOUT CONTRAST TECHNIQUE: Multidetector CT imaging of the head and maxillofacial structures were performed using the standard protocol without intravenous contrast. Multiplanar CT image reconstructions of the maxillofacial structures were also generated. COMPARISON:  None. FINDINGS: CT HEAD FINDINGS Brain: The calvarial/skull base mass lesion is described below. Spur this process exerts mass effect on the left cerebellum with suspected narrowing of the fourth ventricle. No evidence of hydrocephalus. No evidence of acute large vascular territory infarct. No acute hemorrhage. Mild to moderate scattered white matter hypodensities, which are nonspecific but most likely relate to chronic microvascular ischemic disease. No midline shift. Vascular: No hyperdense vessel identified. Calcific atherosclerosis. Skull/skull base: There is aggressive soft tissue mass lesion involving the left occipital calvarium measuring up to approximately 5.5 x 3.1 by 3.3 cm (transverse by AP by craniocaudal) with bony destruction and expansion and extension inferiorly to involve the left temporal bone and skull base. There is mass effect on the  inferior left cerebellum and possible involvement of the subjacent dural venous sinuses. Small left mastoid effusion. CT MAXILLOFACIAL FINDINGS Osseous: No fracture or mandibular dislocation. No destructive process in the face. Orbits: Negative. No traumatic or inflammatory finding. Sinuses: Small right maxillary sinus retention cyst. Otherwise, sinuses are clear. Soft tissues: Negative. 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 Resulting mass effect on the left cerebellum and possible involvement of the subjacent dural  venous sinuses. 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 Osteomyelitis is a differential consideration. Recommend MRI brain with and without contrast to further evaluate. Findings and recommendations discussed with Ashok Cordia, PA via telephone at 11:20 a.m. Electronically Signed   By: Margaretha Sheffield MD   On: 02/15/2021 11:29   CT CHEST W CONTRAST  Result Date: 03/06/2021 CLINICAL DATA:  Respiratory failure, shortness of breath EXAM: CT CHEST WITH CONTRAST TECHNIQUE: Multidetector CT imaging of the chest was performed during intravenous contrast administration. CONTRAST:  77mL OMNIPAQUE IOHEXOL 300 MG/ML  SOLN COMPARISON:  02/27/2021.  Chest x-ray 03/05/2021 FINDINGS: Cardiovascular: Heart is normal size. Coronary artery and aortic calcifications. No aneurysm. Mediastinum/Nodes: Bulky mediastinal and bilateral hilar adenopathy again noted, unchanged. Trachea is patent. Thyroid unremarkable. Lungs/Pleura: Ground-glass airspace opacities in the lower lobes have worsened since prior study, particularly in the left lower lobe. Ground-glass opacities in the upper lobes somewhat improved since prior study. Numerous bilateral pulmonary nodules are stable. Right upper lobe mass again noted and stable. No effusions. Upper Abdomen: Imaging into the upper abdomen demonstrates no acute findings. Musculoskeletal: Chest wall soft tissues are unremarkable. Multiple osseous metastases again noted, unchanged. IMPRESSION: Redemonstrated is extensive metastatic disease in the chest with numerous pulmonary nodules, dominant right upper lobe mass, and bulky mediastinal/bilateral hilar adenopathy. Ground-glass opacities have increased in the lower lobes, left greater than right, likely infectious/inflammatory. Somewhat improvement in ground-glass opacities in the upper lobe since prior study. Stable osseous metastatic disease. Coronary artery disease. Aortic Atherosclerosis  (ICD10-I70.0). Electronically Signed   By: Rolm Baptise M.D.   On: 03/06/2021 00:11   CT Angio Chest PE W and/or Wo Contrast  Result Date: 02/27/2021 CLINICAL DATA:  PE suspected, widely metastatic lung cancer EXAM: CT ANGIOGRAPHY CHEST WITH CONTRAST TECHNIQUE: Multidetector CT imaging of the chest was performed using the standard protocol during bolus administration of intravenous contrast. Multiplanar CT image reconstructions and MIPs were obtained to evaluate the vascular anatomy. CONTRAST:  1mL OMNIPAQUE IOHEXOL 350 MG/ML SOLN COMPARISON:  02/15/2021 FINDINGS: Cardiovascular: Satisfactory opacification of the pulmonary arteries to the segmental level. No evidence of pulmonary embolism. Cardiomegaly. Left and right coronary artery calcifications and stents. No pericardial effusion. Mediastinum/Nodes: Redemonstrated, very extensive bulky bilateral hilar, mediastinal, supraclavicular, and left axillary lymphadenopathy. Thyroid gland, trachea, and esophagus demonstrate no significant findings. Lungs/Pleura: Diffuse bilateral bronchial wall thickening. Spiculated right upper lobe mass as seen on prior examination (series 7, image 42). Numerous redemonstrated small pulmonary nodules throughout the lungs. There is new, extensive ground-glass airspace opacity throughout the lungs, somewhat geographic in the upper lobes (series 7, image 33), although somewhat more nodular appearing in the lower lungs (series 7, image 58). No pleural effusion or pneumothorax. Upper Abdomen: No acute abnormality. Multiple low-attenuation lesions of liver, better assessed by prior dedicated of the abdomen. Musculoskeletal: No chest wall abnormality. Multiple osseous metastatic lesions, most significantly a lytic lesion of the T9  vertebral body (series 9, image 58). Review of the MIP images confirms the above findings. IMPRESSION: 1. Negative examination for pulmonary embolism. 2. There is new, extensive ground-glass airspace opacity  throughout the lungs, somewhat geographic in the upper lobes, although somewhat more nodular appearing in the lower lungs. Findings are nonspecific and infectious or inflammatory, differential considerations primarily include drug toxicity and atypical/viral infection. 3. Diffuse bilateral bronchial wall thickening, consistent with nonspecific infectious or inflammatory bronchitis. 4. Redemonstrated findings of advanced metastatic lung malignancy, better assessed by recent prior staging examination. 5. Coronary artery disease. Electronically Signed   By: Eddie Candle M.D.   On: 02/27/2021 10:49   CT Thoracic Spine Wo Contrast  Result Date: 02/18/2021 CLINICAL DATA:  Metastases on CT abdomen EXAM: CT THORACIC AND LUMBAR SPINE WITHOUT CONTRAST TECHNIQUE: Multidetector CT imaging of the thoracic and lumbar spine was performed without contrast. Multiplanar CT image reconstructions were also generated. COMPARISON:  None. FINDINGS: CT THORACIC SPINE FINDINGS Alignment: Preserved. Vertebrae: There is a small partially sclerotic lesion at the right aspect of the T5 vertebral body. A lytic lesion is present at the anterior aspect T9 likely with pathologic fracture but no substantial height loss. Ill-defined lucent lesions are present elsewhere. Paraspinal and other soft tissues: Better evaluated on prior dedicated imaging. Disc levels: Multilevel degenerative changes. No high-grade degenerative canal narrowing. CT LUMBAR SPINE FINDINGS Segmentation: 5 lumbar type vertebrae. Alignment: Dextrocurvature.  Grade 1 anterolisthesis at L4-L5 Vertebrae: Lucency and sclerosis at L2 with pathologic compression fracture resulting in less than 50% loss of height at the superior endplate. Sclerotic lesion of the right sacrum. Paraspinal and other soft tissues: Better evaluated on prior dedicated imaging. Disc levels: Multilevel degenerative changes. Canal stenosis is greatest at L3-L4 and L4-L5. Foraminal narrowing is greatest on the  right at L5-S1. IMPRESSION: Sclerotic and lytic metastatic lesions as already identified on the CT chest, abdomen, and pelvis. Pathologic L2 fracture with less than 50% loss of height. Probable pathologic fracture at T9 without height loss. No apparent significant epidural disease by CT. Electronically Signed   By: Macy Mis M.D.   On: 02/18/2021 10:53   CT Lumbar Spine Wo Contrast  Result Date: 02/18/2021 CLINICAL DATA:  Metastases on CT abdomen EXAM: CT THORACIC AND LUMBAR SPINE WITHOUT CONTRAST TECHNIQUE: Multidetector CT imaging of the thoracic and lumbar spine was performed without contrast. Multiplanar CT image reconstructions were also generated. COMPARISON:  None. FINDINGS: CT THORACIC SPINE FINDINGS Alignment: Preserved. Vertebrae: There is a small partially sclerotic lesion at the right aspect of the T5 vertebral body. A lytic lesion is present at the anterior aspect T9 likely with pathologic fracture but no substantial height loss. Ill-defined lucent lesions are present elsewhere. Paraspinal and other soft tissues: Better evaluated on prior dedicated imaging. Disc levels: Multilevel degenerative changes. No high-grade degenerative canal narrowing. CT LUMBAR SPINE FINDINGS Segmentation: 5 lumbar type vertebrae. Alignment: Dextrocurvature.  Grade 1 anterolisthesis at L4-L5 Vertebrae: Lucency and sclerosis at L2 with pathologic compression fracture resulting in less than 50% loss of height at the superior endplate. Sclerotic lesion of the right sacrum. Paraspinal and other soft tissues: Better evaluated on prior dedicated imaging. Disc levels: Multilevel degenerative changes. Canal stenosis is greatest at L3-L4 and L4-L5. Foraminal narrowing is greatest on the right at L5-S1. IMPRESSION: Sclerotic and lytic metastatic lesions as already identified on the CT chest, abdomen, and pelvis. Pathologic L2 fracture with less than 50% loss of height. Probable pathologic fracture at T9 without height loss. No  apparent  significant epidural disease by CT. Electronically Signed   By: Macy Mis M.D.   On: 02/18/2021 10:53   MR Brain W and Wo Contrast  Result Date: 02/15/2021 CLINICAL DATA:  Abnormal head CT.  Skull base mass. EXAM: MRI HEAD WITHOUT AND WITH CONTRAST TECHNIQUE: Multiplanar, multiecho pulse sequences of the brain and surrounding structures were obtained without and with intravenous contrast. CONTRAST:  54mL GADAVIST GADOBUTROL 1 MMOL/ML IV SOLN COMPARISON:  CT head 02/15/2021 FINDINGS: Brain: Large destructive mass in the posterior skull base bilaterally left greater than right. This involves the occipital bone with extension into the left temporal bone. The occipital bone is significantly expanded due to tumor on the left with mass-effect on the left cerebellum. Soft tissue mass associated with left occipital lesion measures approximately 5.2 x 3.0 cm. Mild edema in the left cerebellum. There is infiltrating tumor in the left temporal bone which is more sizable than would be predicted from the CT. The soft tissue mass associated with the left occipital bone shows susceptibility which may be due to mineralization and/or bone fragments from bone destruction. Ventricle size normal. No midline shift. Moderate white matter changes likely due to chronic microvascular ischemia. No acute infarct. Chronic microhemorrhage in the left parietal lobe. Subtle enhancing lesions in the brain in the right frontal lobe measuring approximately 10 mm, and 4 mm best seen on coronal and sagittal postcontrast imaging. This is suspicious for metastatic disease. Vascular: Normal arterial flow voids. Normal venous enhancement. No evidence of venous sinus thrombosis. Skull and upper cervical spine: Infiltrating destructive mass in the occipital bone bilaterally left greater than right with extension into the left temporal bone. Additional lesions in the frontal bone bilaterally and in the left parietal bone likely metastatic  disease. Sinuses/Orbits: Mild mucosal edema paranasal sinuses. Negative orbit Other: None IMPRESSION: Destructive mass lesion in the occipital bone bilaterally left greater than right. There is associated soft tissue mass in the left occipital bone extending into the posterior fossa with mass-effect and mild edema of the left cerebellum. This mass extends into the left temporal bone. Additional bone lesions are present in the frontal bones and left parietal bone. Subtle enhancing lesions right frontal lobe measuring 10 mm and 4 mm. Findings compatible with metastatic disease. Electronically Signed   By: Franchot Gallo M.D.   On: 02/15/2021 13:47   NM Bone Scan Whole Body  Result Date: 02/27/2021 CLINICAL DATA:  Metastatic disease evaluation, lung mass, abnormal CT EXAM: NUCLEAR MEDICINE WHOLE BODY BONE SCAN TECHNIQUE: Whole body anterior and posterior images were obtained approximately 3 hours after intravenous injection of radiopharmaceutical. RADIOPHARMACEUTICALS:  19.79 mCi Technetium-16m MDP IV COMPARISON:  None Correlation: CT chest abdomen pelvis 02/15/2021, CT thoracic and lumbar spine 02/18/2021, CT head 02/15/2021 FINDINGS: Multiple sites of abnormal tracer uptake are identified consistent with widespread osseous metastatic disease. These include calvaria greatest at occipital bone growth more on LEFT, thoracic and lumbar spine, RIGHT ribs, and pelvis. Dextroconvex thoracolumbar scoliosis. No definite abnormal tracer uptake within the humeri or femora. Expected urinary tract and soft tissue distribution of tracer. IMPRESSION: Scattered osseous metastases as above. Electronically Signed   By: Lavonia Dana M.D.   On: 02/27/2021 18:17   US Renal  Result Date: 02/18/2021 CLINICAL DATA:  Back pain. Right renal pelvis fullness on right upper quadrant ultrasound performed 1 day prior EXAM: RENAL / URINARY TRACT ULTRASOUND COMPLETE COMPARISON:  02/17/2021 abdominal sonogram. FINDINGS: Right Kidney: Renal  measurements: 10.3 x 4.2 x 4.2 cm = volume:  95 mL. Normal parenchymal echogenicity and thickness. Mild right hydronephrosis. Possible 5 mm right ureteropelvic junction stone. Nonobstructing 3 mm lower right renal stone. No renal masses. Left Kidney: Renal measurements: 10.0 x 4.8 x 4.2 cm = volume: 107 mL. Normal parenchymal echogenicity and thickness. No left hydronephrosis. No renal masses. Probable nonobstructing 7 mm upper left renal stone. Bladder: Appears normal for degree of bladder distention. Bilateral ureteral jets seen in the bladder. Other: None. IMPRESSION: 1. Mild right hydronephrosis. Possible 5 mm right UPJ stone. Nonobstructing 3 mm lower right renal stone. Suggest unenhanced CT abdomen/pelvis for further evaluation. 2. Probable nonobstructing upper left renal stone. Electronically Signed   By: Ilona Sorrel M.D.   On: 02/18/2021 12:02   CT CHEST ABDOMEN PELVIS W CONTRAST  Result Date: 02/15/2021 CLINICAL DATA:  Altered mental status. 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 EXAM: CT CHEST, ABDOMEN, AND PELVIS WITH CONTRAST TECHNIQUE: Multidetector CT imaging of the chest, abdomen and pelvis was performed following the standard protocol during bolus administration of intravenous contrast. CONTRAST:  75 mL OMNIPAQUE IOHEXOL 300 MG/ML  SOLN COMPARISON:  None. FINDINGS: CT CHEST FINDINGS Cardiovascular: No significant vascular findings. Normal heart size. No pericardial effusion. Mediastinum/Nodes: The patient has extensive lymphadenopathy. A 2 cm left axillary node is seen on image 11 of series 2; a second left axillary node on image 14 measures 1.4 cm. A 1.1 cm subpectoral node is seen on the left on image 15 of series 2. A 1.4 cm left supraclavicular node is seen on image 9. Right paratracheal node on image 15 measures 1.9 cm. Right precarinal nodal mass on image 25 measures 2.6 cm and there is a right hilar nodal mass measuring 4.2 x 3.1 cm on image 30. The esophagus and  thyroid are negative. Lungs/Pleura: Small right pleural effusion. A spiculated nodule in the right middle lobe measures 2.6 cm transverse by 2.7 cm AP on image 77, series 4. Scattered pulmonary nodules include a 0.5 cm nodule in the superior segment of the left lower lobe on image 80, 0.4 cm left upper lobe nodule on image 76 and 2 punctate nodules on image 83. Additional smaller nodules are seen scattered through both lungs. The lungs are emphysematous. No pleural effusion. Musculoskeletal: A 0.9 cm lytic lesion is seen in T5, lytic lesions are seen in T7 and T9, larger in T9. There is also a small lytic lesion in T10. CT ABDOMEN PELVIS FINDINGS Hepatobiliary: Metastatic lesions are seen in the left hepatic lobe measuring 1.7 cm on image 57, series 2 and near the dome of the liver on image 50 of series 2 where a 1.1 cm lesion is identified. A second lesion near the dome of the liver in the right hepatic lobe on image 50 measures 0.7 cm. There is mild intra and extrahepatic biliary ductal dilatation likely related to prior cholecystectomy. Pancreas: Unremarkable. No pancreatic ductal dilatation or surrounding inflammatory changes. Spleen: Normal in size without focal abnormality. Adrenals/Urinary Tract: Adrenal glands are unremarkable. Kidneys are normal, without renal calculi, focal lesion, or hydronephrosis. Bladder is unremarkable. Stomach/Bowel: Stomach is within normal limits. Appendix appears normal. No evidence of bowel wall thickening, distention, or inflammatory changes. Moderately large colonic stool burden throughout noted. Vascular/Lymphatic: Aortic atherosclerosis. No aneurysm. No pathologic lymphadenopathy by CT size criteria. Reproductive: Status post hysterectomy. No adnexal masses. Other: None. Musculoskeletal: Mottled appearance of the L2 vertebral body is consistent with metastatic disease. There is a mild pathologic superior endplate compression fracture of L2  with vertebral body height loss  centrally of approximately 30%. IMPRESSION: Findings consistent with extensive metastatic carcinoma likely emanating from a 2.7 cm right middle lobe pulmonary nodule. Metastases include extensive lymphadenopathy in the chest,, pulmonary nodule bone lesions and liver lesions. Mild pathologic fracture of L2 with vertebral body height loss centrally of up to approximately 30% is again noted. Aortic Atherosclerosis (ICD10-I70.0) and Emphysema (ICD10-J43.9). Electronically Signed   By: Inge Rise M.D.   On: 02/15/2021 14:25   DG Chest Portable 1 View  Result Date: 03/07/2021 CLINICAL DATA:  Shortness of breath EXAM: PORTABLE CHEST 1 VIEW COMPARISON:  03/05/2021 FINDINGS: Patchy bilateral airspace disease throughout the lungs, slightly worsened since prior study. Nodular areas within the lungs, stable since prior study. Heart is normal size. No effusions or acute bony abnormality. IMPRESSION: Worsening patchy bilateral airspace disease, likely worsening pneumonia. Scattered nodules unchanged. Electronically Signed   By: Rolm Baptise M.D.   On: 03/07/2021 21:41   DG Chest Port 1 View  Result Date: 02/27/2021 CLINICAL DATA:  Worsening shortness of breath since this morning. Recent diagnosis of lung cancer. EXAM: PORTABLE CHEST 1 VIEW COMPARISON:  Radiograph earlier today.  CT earlier today. FINDINGS: Stable heart size and mediastinal contours. Right hilar prominence and thickening of the lower paratracheal stripe related to known adenopathy. Unchanged right mid lung pulmonary nodule. Mild patchy bilateral suprahilar opacities corresponding to ground-glass opacity on CT earlier today. No significant change in the interim. No pneumothorax or large pleural effusion. Retained excreted IV contrast in the right renal collecting system with right hydronephrosis, partially included in the upper abdomen. IMPRESSION: 1. Stable radiographic appearance of the chest from earlier today. Similar patchy suprahilar opacities  corresponding to ground-glass opacity on CT. 2. Right mid lung pulmonary nodule and thoracic adenopathy. 3. Partially included in the upper abdomen is retained excreted contrast in the right renal collecting system with right hydronephrosis. Right hydronephrosis was seen on renal ultrasound 02/18/2021. Electronically Signed   By: Keith Rake M.D.   On: 02/27/2021 15:04   DG Chest Portable 1 View  Result Date: 02/27/2021 CLINICAL DATA:  60 year old female with shortness of breath and cough. Metastatic lung cancer. EXAM: PORTABLE CHEST 1 VIEW COMPARISON:  02/15/2021 and prior studies FINDINGS: New hazy opacities within the central/upper lungs noted. RIGHT middle lobe mass and small scattered pulmonary nodules bilaterally are again identified. No pleural effusion or pneumothorax. No acute bony abnormalities are identified. IMPRESSION: New hazy opacities within the central/upper lungs which may represent edema or infection. Unchanged RIGHT middle lobe mass and bilateral pulmonary nodules compatible with known malignancy/metastases. Electronically Signed   By: Margarette Canada M.D.   On: 02/27/2021 08:49   [place] CORE BIOPSY (LYMPH NODES)  Result Date: 02/21/2021 INDICATION: Multiple enlarged lymph nodes, right middle lobe lung mass, liver lesions and bone lesions. EXAM: ULTRASOUND GUIDED CORE BIOPSY OF LEFT SUPRACLAVICULAR LYMPH NODE MEDICATIONS: None. ANESTHESIA/SEDATION: Versed 2.0 mg IV Moderate Sedation Time:  12 minutes. The patient was continuously monitored during the procedure by the interventional radiology nurse under my direct supervision. PROCEDURE: The procedure, risks, benefits, and alternatives were explained to the patient. Questions regarding the procedure were encouraged and answered. The patient understands and consents to the procedure. A time-out was performed prior to initiating the procedure. The left neck was prepped with chlorhexidine in a sterile fashion, and a sterile drape was applied  covering the operative field. A sterile gown and sterile gloves were used for the procedure. Local anesthesia was provided with 1%  Lidocaine. After localizing an enlarged left supraclavicular lymph node by ultrasound, multiple 18 gauge core biopsy samples were obtained. Core biopsy samples were submitted in formalin as well as on saline soaked Telfa gauze. Additional ultrasound was performed. COMPLICATIONS: None immediate. FINDINGS: An enlarged left supraclavicular lymph node measures approximately 3.0 x 1.8 x 2.9 cm. Solid core biopsy samples were obtained. IMPRESSION: Ultrasound-guided core biopsy performed of an enlarged left supraclavicular lymph node measuring 3 cm in greatest dimensions. Electronically Signed   By: Aletta Edouard M.D.   On: 02/21/2021 15:31   CT Maxillofacial Wo Contrast  Result Date: 02/15/2021 CLINICAL DATA:  Headache with left-sided face/neck/head pain. On antibiotics for reported laryngitis with bilateral ear pain. EXAM: CT HEAD WITHOUT CONTRAST CT MAXILLOFACIAL WITHOUT CONTRAST TECHNIQUE: Multidetector CT imaging of the head and maxillofacial structures were performed using the standard protocol without intravenous contrast. Multiplanar CT image reconstructions of the maxillofacial structures were also generated. COMPARISON:  None. FINDINGS: CT HEAD FINDINGS Brain: The calvarial/skull base mass lesion is described below. Spur this process exerts mass effect on the left cerebellum with suspected narrowing of the fourth ventricle. No evidence of hydrocephalus. No evidence of acute large vascular territory infarct. No acute hemorrhage. Mild to moderate scattered white matter hypodensities, which are nonspecific but most likely relate to chronic microvascular ischemic disease. No midline shift. Vascular: No hyperdense vessel identified. Calcific atherosclerosis. Skull/skull base: There is aggressive soft tissue mass lesion involving the left occipital calvarium measuring up to  approximately 5.5 x 3.1 by 3.3 cm (transverse by AP by craniocaudal) with bony destruction and expansion and extension inferiorly to involve the left temporal bone and skull base. There is mass effect on the inferior left cerebellum and possible involvement of the subjacent dural venous sinuses. Small left mastoid effusion. CT MAXILLOFACIAL FINDINGS Osseous: No fracture or mandibular dislocation. No destructive process in the face. Orbits: Negative. No traumatic or inflammatory finding. Sinuses: Small right maxillary sinus retention cyst. Otherwise, sinuses are clear. Soft tissues: Negative. 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 Resulting mass effect on the left cerebellum and possible involvement of the subjacent dural venous sinuses. 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 Osteomyelitis is a differential consideration. Recommend MRI brain with and without contrast to further evaluate. Findings and recommendations discussed with Ashok Cordia, PA via telephone at 11:20 a.m. Electronically Signed   By: Margaretha Sheffield MD   On: 02/15/2021 11:29   US Abdomen Limited RUQ (LIVER/GB)  Result Date: 02/17/2021 CLINICAL DATA:  Nausea and vomiting for 1 day. EXAM: ULTRASOUND ABDOMEN LIMITED RIGHT UPPER QUADRANT COMPARISON:  CT chest, abdomen and pelvis 02/15/2021. FINDINGS: Gallbladder: Removed. Common bile duct: Diameter: 0.8 cm. Liver: Three hypoechoic liver lesions measuring up to 1.8 cm are identified are identified and correlate with metastatic deposit seen on prior CT. Mild intrahepatic biliary ductal dilatation seen on CT is also noted. Portal vein is patent on color Doppler imaging with normal direction of blood flow towards the liver. Other: There is fullness of the right renal pelvis which is new since the  patient's CT scan yesterday. IMPRESSION: Three hypoechoic liver lesions measuring up to 1.8 cm correlate with metastatic deposit seen on CT. Status post cholecystectomy. Mild intra and extrahepatic biliary ductal dilatation is likely related to cholecystectomy. No obstructing lesion is identified. Fullness of the right renal pelvis is new  since yesterday's examination. This could be incidental. Correlation with dedicated renal ultrasound could be used for further evaluation if indicated. Electronically Signed   By: Inge Rise M.D.   On: 02/17/2021 12:53       Subjective:   Discharge Exam: Vitals:   03/08/21 0815 03/08/21 0830  BP:    Pulse: (!) 107 96  Resp: (!) 27 12  Temp:    SpO2: 100% 98%   Vitals:   03/08/21 0730 03/08/21 0800 03/08/21 0815 03/08/21 0830  BP: (!) 143/91     Pulse: 92 99 (!) 107 96  Resp: 17 15 (!) 27 12  Temp:      TempSrc:      SpO2: 95% 96% 100% 98%  Weight:      Height:        General: Pt is alert, awake, not in acute distress Cardiovascular: RRR, S1/S2 +, no rubs, no gallops Respiratory: CTA bilaterally, no wheezing, no rhonchi Abdominal: Soft, NT, ND, bowel sounds + Extremities: no edema, no cyanosis    The results of significant diagnostics from this hospitalization (including imaging, microbiology, ancillary and laboratory) are listed below for reference.     Microbiology: Recent Results (from the past 240 hour(s))  Culture, blood (single)     Status: None   Collection Time: 02/27/21  9:07 AM   Specimen: BLOOD  Result Value Ref Range Status   Specimen Description BLOOD RIGHT FOREARM  Final   Special Requests   Final    BOTTLES DRAWN AEROBIC AND ANAEROBIC Blood Culture adequate volume   Culture   Final    NO GROWTH 5 DAYS Performed at [facility], [address] [place], [place] [address]    Report Status 03/04/2021 FINAL  Final  Resp Panel by RT-PCR (Flu A&B, Covid) Nasopharyngeal Swab     Status: None   Collection  Time: 02/27/21  9:07 AM   Specimen: Nasopharyngeal Swab; Nasopharyngeal(NP) swabs in vial transport medium  Result Value Ref Range Status   SARS Coronavirus 2 by RT PCR NEGATIVE NEGATIVE Final    Comment: (NOTE) SARS-CoV-2 target nucleic acids are NOT DETECTED.  The SARS-CoV-2 RNA is generally detectable in upper respiratory specimens during the acute phase of infection. The lowest concentration of SARS-CoV-2 viral copies this assay can detect is 138 copies/mL. A negative result does not preclude SARS-Cov-2 infection and should not be used as the sole basis for treatment or other patient management decisions. A negative result may occur with  improper specimen collection/handling, submission of specimen other than nasopharyngeal swab, presence of viral mutation(s) within the areas targeted by this assay, and inadequate number of viral copies(<138 copies/mL). A negative result must be combined with clinical observations, patient history, and epidemiological information. The expected result is Negative.  Fact Sheet for Patients:  EntrepreneurPulse.com.au  Fact Sheet for Healthcare Providers:  IncredibleEmployment.be  This test is no t yet approved or cleared by the [place] FDA and  has been authorized for detection and/or diagnosis of SARS-CoV-2 by FDA under an Emergency Use Authorization (EUA). This EUA will remain  in effect (meaning this test can be used) for the duration of the COVID-19 declaration under Section 564(b)(1) of the Act, 21 U.S.C.section 360bbb-3(b)(1), unless the authorization is terminated  or revoked sooner.       Influenza A by PCR NEGATIVE NEGATIVE Final   Influenza B by PCR NEGATIVE NEGATIVE Final    Comment: (NOTE) The Xpert Xpress SARS-CoV-2/FLU/RSV plus assay is intended as an aid  in the diagnosis of influenza from Nasopharyngeal swab specimens and should not be used as a sole basis for treatment. Nasal washings  and aspirates are unacceptable for Xpert Xpress SARS-CoV-2/FLU/RSV testing.  Fact Sheet for Patients: EntrepreneurPulse.com.au  Fact Sheet for Healthcare Providers: IncredibleEmployment.be  This test is not yet approved or cleared by the [place] FDA and has been authorized for detection and/or diagnosis of SARS-CoV-2 by FDA under an Emergency Use Authorization (EUA). This EUA will remain in effect (meaning this test can be used) for the duration of the COVID-19 declaration under Section 564(b)(1) of the Act, 21 U.S.C. section 360bbb-3(b)(1), unless the authorization is terminated or revoked.  Performed at [facility], [address] [place], [place] [address]   MRSA PCR Screening     Status: None   Collection Time: 03/02/21  8:15 AM   Specimen: Nasopharyngeal  Result Value Ref Range Status   MRSA by PCR NEGATIVE NEGATIVE Final    Comment:        The GeneXpert MRSA Assay (FDA approved for NASAL specimens only), is one component of a comprehensive MRSA colonization surveillance program. It is not intended to diagnose MRSA infection nor to guide or monitor treatment for MRSA infections. Performed at [facility], [address] [place], [place] [address]   Resp Panel by RT-PCR (Flu A&B, Covid) Nasopharyngeal Swab     Status: None   Collection Time: 03/05/21  9:43 PM   Specimen: Nasopharyngeal Swab; Nasopharyngeal(NP) swabs in vial transport medium  Result Value Ref Range Status   SARS Coronavirus 2 by RT PCR NEGATIVE NEGATIVE Final    Comment: (NOTE) SARS-CoV-2 target nucleic acids are NOT DETECTED.  The SARS-CoV-2 RNA is generally detectable in upper respiratory specimens during the acute phase of infection. The lowest concentration of SARS-CoV-2 viral copies this assay can detect is 138 copies/mL. A negative result does not preclude SARS-Cov-2 infection and should not be used as the sole basis for  treatment or other patient management decisions. A negative result may occur with  improper specimen collection/handling, submission of specimen other than nasopharyngeal swab, presence of viral mutation(s) within the areas targeted by this assay, and inadequate number of viral copies(<138 copies/mL). A negative result must be combined with clinical observations, patient history, and epidemiological information. The expected result is Negative.  Fact Sheet for Patients:  EntrepreneurPulse.com.au  Fact Sheet for Healthcare Providers:  IncredibleEmployment.be  This test is no t yet approved or cleared by the [place] FDA and  has been authorized for detection and/or diagnosis of SARS-CoV-2 by FDA under an Emergency Use Authorization (EUA). This EUA will remain  in effect (meaning this test can be used) for the duration of the COVID-19 declaration under Section 564(b)(1) of the Act, 21 U.S.C.section 360bbb-3(b)(1), unless the authorization is terminated  or revoked sooner.       Influenza A by PCR NEGATIVE NEGATIVE Final   Influenza B by PCR NEGATIVE NEGATIVE Final    Comment: (NOTE) The Xpert Xpress SARS-CoV-2/FLU/RSV plus assay is intended as an aid in the diagnosis of influenza from Nasopharyngeal swab specimens and should not be used as a sole basis for treatment. Nasal washings and aspirates are unacceptable for Xpert Xpress SARS-CoV-2/FLU/RSV testing.  Fact Sheet for Patients: EntrepreneurPulse.com.au  Fact Sheet for Healthcare Providers: IncredibleEmployment.be  This test is not yet approved or cleared by the [place] FDA and has been authorized for detection and/or diagnosis of SARS-CoV-2 by FDA under an Emergency Use Authorization (EUA). This  EUA will remain in effect (meaning this test can be used) for the duration of the COVID-19 declaration under Section 564(b)(1) of the Act, 21  U.S.C. section 360bbb-3(b)(1), unless the authorization is terminated or revoked.  Performed at [facility], [address] [place], [place] [address]   Resp Panel by RT-PCR (Flu A&B, Covid) Nasopharyngeal Swab     Status: None   Collection Time: 03/07/21  9:24 PM   Specimen: Nasopharyngeal Swab; Nasopharyngeal(NP) swabs in vial transport medium  Result Value Ref Range Status   SARS Coronavirus 2 by RT PCR NEGATIVE NEGATIVE Final    Comment: (NOTE) SARS-CoV-2 target nucleic acids are NOT DETECTED.  The SARS-CoV-2 RNA is generally detectable in upper respiratory specimens during the acute phase of infection. The lowest concentration of SARS-CoV-2 viral copies this assay can detect is 138 copies/mL. A negative result does not preclude SARS-Cov-2 infection and should not be used as the sole basis for treatment or other patient management decisions. A negative result may occur with  improper specimen collection/handling, submission of specimen other than nasopharyngeal swab, presence of viral mutation(s) within the areas targeted by this assay, and inadequate number of viral copies(<138 copies/mL). A negative result must be combined with clinical observations, patient history, and epidemiological information. The expected result is Negative.  Fact Sheet for Patients:  EntrepreneurPulse.com.au  Fact Sheet for Healthcare Providers:  IncredibleEmployment.be  This test is no t yet approved or cleared by the [place] FDA and  has been authorized for detection and/or diagnosis of SARS-CoV-2 by FDA under an Emergency Use Authorization (EUA). This EUA will remain  in effect (meaning this test can be used) for the duration of the COVID-19 declaration under Section 564(b)(1) of the Act, 21 U.S.C.section 360bbb-3(b)(1), unless the authorization is terminated  or revoked sooner.       Influenza A by PCR NEGATIVE NEGATIVE Final    Influenza B by PCR NEGATIVE NEGATIVE Final    Comment: (NOTE) The Xpert Xpress SARS-CoV-2/FLU/RSV plus assay is intended as an aid in the diagnosis of influenza from Nasopharyngeal swab specimens and should not be used as a sole basis for treatment. Nasal washings and aspirates are unacceptable for Xpert Xpress SARS-CoV-2/FLU/RSV testing.  Fact Sheet for Patients: EntrepreneurPulse.com.au  Fact Sheet for Healthcare Providers: IncredibleEmployment.be  This test is not yet approved or cleared by the [place] FDA and has been authorized for detection and/or diagnosis of SARS-CoV-2 by FDA under an Emergency Use Authorization (EUA). This EUA will remain in effect (meaning this test can be used) for the duration of the COVID-19 declaration under Section 564(b)(1) of the Act, 21 U.S.C. section 360bbb-3(b)(1), unless the authorization is terminated or revoked.  Performed at [facility], [address] [place], [place] [address]   Blood culture (routine x 2)     Status: None (Preliminary result)   Collection Time: 03/07/21  9:24 PM   Specimen: BLOOD  Result Value Ref Range Status   Specimen Description BLOOD BLOOD RIGHT HAND  Final   Special Requests   Final    BOTTLES DRAWN AEROBIC AND ANAEROBIC Blood Culture adequate volume   Culture   Final    NO GROWTH < 12 HOURS Performed at [facility], [address], [place], [place] [address]    Report Status PENDING  Incomplete  Blood culture (routine x 2)     Status: None (Preliminary result)   Collection Time: 03/07/21  9:24 PM   Specimen: BLOOD  Result Value Ref  Range Status   Specimen Description BLOOD RIGHT ANTECUBITAL  Final   Special Requests   Final    BOTTLES DRAWN AEROBIC AND ANAEROBIC Blood Culture adequate volume   Culture   Final    NO GROWTH < 12 HOURS Performed at [facility], [address] [place], [place] [address]    Report Status PENDING   Incomplete     Labs: BNP (last 3 results) Recent Labs    02/27/21 0819 03/05/21 2143 03/07/21 2123  BNP 127.2* 92.2 599.3*   Basic Metabolic Panel: Recent Labs  Lab 03/02/21 0551 03/05/21 2143 03/06/21 0459 03/07/21 2123 03/08/21 0540  NA 138 140 140 143 140  K 4.0 3.5 4.4 3.8 4.4  CL 110 105 104 105 106  CO2 21* 25 27 29 25   GLUCOSE 124* 124* 151* 156* 181*  BUN 24* 32* 27* 37* 30*  CREATININE 0.62 0.77 0.57 0.97 0.71  CALCIUM 8.4* 8.0* 8.2* 8.3* 8.0*   Liver Function Tests: Recent Labs  Lab 03/05/21 2143 03/06/21 0459 03/07/21 2123  AST 23 21 25   ALT 32 32 32  ALKPHOS 290* 294* 329*  BILITOT 0.6 0.6 0.5  PROT 6.1* 6.2* 6.4*  ALBUMIN 2.9* 2.8* 2.9*   No results for input(s): LIPASE, AMYLASE in the last 168 hours. Recent Labs  Lab 03/06/21 0459  AMMONIA 27   CBC: Recent Labs  Lab 03/02/21 0551 03/02/21 1349 03/05/21 2143 03/07/21 2123 03/08/21 0540  WBC 19.8* 24.0* 19.8* 21.3* 18.7*  NEUTROABS  --   --  15.5* 17.7*  --   HGB 8.7* 9.2* 9.5* 9.2* 8.5*  HCT 26.6* 28.8* 29.0* 28.6* 26.6*  MCV 87.2 90.3 86.8 87.7 89.0  PLT 231 244 214 189 137*   Cardiac Enzymes: No results for input(s): CKTOTAL, CKMB, CKMBINDEX, TROPONINI in the last 168 hours. BNP: Invalid input(s): POCBNP CBG: No results for input(s): GLUCAP in the last 168 hours. D-Dimer Recent Labs    03/06/21 0459  DDIMER 19.91*   Hgb A1c No results for input(s): HGBA1C in the last 72 hours. Lipid Profile No results for input(s): CHOL, HDL, LDLCALC, TRIG, CHOLHDL, LDLDIRECT in the last 72 hours. Thyroid function studies No results for input(s): TSH, T4TOTAL, T3FREE, THYROIDAB in the last 72 hours.  Invalid input(s): FREET3 Anemia work up No results for input(s): VITAMINB12, FOLATE, FERRITIN, TIBC, IRON, RETICCTPCT in the last 72 hours. Urinalysis    Component Value Date/Time   COLORURINE YELLOW (A) 02/18/2021 0924   APPEARANCEUR HAZY (A) 02/18/2021 0924   APPEARANCEUR Clear  11/04/2013 1649   LABSPEC 1.015 02/18/2021 0924   LABSPEC 1.009 11/04/2013 1649   PHURINE 6.0 02/18/2021 0924   GLUCOSEU NEGATIVE 02/18/2021 0924   GLUCOSEU Negative 11/04/2013 1649   HGBUR SMALL (A) 02/18/2021 0924   BILIRUBINUR NEGATIVE 02/18/2021 0924   BILIRUBINUR Negative 11/04/2013 1649   KETONESUR NEGATIVE 02/18/2021 0924   PROTEINUR NEGATIVE 02/18/2021 0924   NITRITE NEGATIVE 02/18/2021 0924   LEUKOCYTESUR NEGATIVE 02/18/2021 0924   LEUKOCYTESUR Negative 11/04/2013 1649   Sepsis Labs Invalid input(s): PROCALCITONIN,  WBC,  LACTICIDVEN Microbiology Recent Results (from the past 240 hour(s))  Culture, blood (single)     Status: None   Collection Time: 02/27/21  9:07 AM   Specimen: BLOOD  Result Value Ref Range Status   Specimen Description BLOOD RIGHT FOREARM  Final   Special Requests   Final    BOTTLES DRAWN AEROBIC AND ANAEROBIC Blood Culture adequate volume   Culture   Final  NO GROWTH 5 DAYS Performed at [facility], [address] [place], [place] [address]    Report Status 03/04/2021 FINAL  Final  Resp Panel by RT-PCR (Flu A&B, Covid) Nasopharyngeal Swab     Status: None   Collection Time: 02/27/21  9:07 AM   Specimen: Nasopharyngeal Swab; Nasopharyngeal(NP) swabs in vial transport medium  Result Value Ref Range Status   SARS Coronavirus 2 by RT PCR NEGATIVE NEGATIVE Final    Comment: (NOTE) SARS-CoV-2 target nucleic acids are NOT DETECTED.  The SARS-CoV-2 RNA is generally detectable in upper respiratory specimens during the acute phase of infection. The lowest concentration of SARS-CoV-2 viral copies this assay can detect is 138 copies/mL. A negative result does not preclude SARS-Cov-2 infection and should not be used as the sole basis for treatment or other patient management decisions. A negative result may occur with  improper specimen collection/handling, submission of specimen other than nasopharyngeal swab, presence of viral mutation(s)  within the areas targeted by this assay, and inadequate number of viral copies(<138 copies/mL). A negative result must be combined with clinical observations, patient history, and epidemiological information. The expected result is Negative.  Fact Sheet for Patients:  EntrepreneurPulse.com.au  Fact Sheet for Healthcare Providers:  IncredibleEmployment.be  This test is no t yet approved or cleared by the [place] FDA and  has been authorized for detection and/or diagnosis of SARS-CoV-2 by FDA under an Emergency Use Authorization (EUA). This EUA will remain  in effect (meaning this test can be used) for the duration of the COVID-19 declaration under Section 564(b)(1) of the Act, 21 U.S.C.section 360bbb-3(b)(1), unless the authorization is terminated  or revoked sooner.       Influenza A by PCR NEGATIVE NEGATIVE Final   Influenza B by PCR NEGATIVE NEGATIVE Final    Comment: (NOTE) The Xpert Xpress SARS-CoV-2/FLU/RSV plus assay is intended as an aid in the diagnosis of influenza from Nasopharyngeal swab specimens and should not be used as a sole basis for treatment. Nasal washings and aspirates are unacceptable for Xpert Xpress SARS-CoV-2/FLU/RSV testing.  Fact Sheet for Patients: EntrepreneurPulse.com.au  Fact Sheet for Healthcare Providers: IncredibleEmployment.be  This test is not yet approved or cleared by the [place] FDA and has been authorized for detection and/or diagnosis of SARS-CoV-2 by FDA under an Emergency Use Authorization (EUA). This EUA will remain in effect (meaning this test can be used) for the duration of the COVID-19 declaration under Section 564(b)(1) of the Act, 21 U.S.C. section 360bbb-3(b)(1), unless the authorization is terminated or revoked.  Performed at [facility], [address] [place], [place] [address]   MRSA PCR Screening     Status: None    Collection Time: 03/02/21  8:15 AM   Specimen: Nasopharyngeal  Result Value Ref Range Status   MRSA by PCR NEGATIVE NEGATIVE Final    Comment:        The GeneXpert MRSA Assay (FDA approved for NASAL specimens only), is one component of a comprehensive MRSA colonization surveillance program. It is not intended to diagnose MRSA infection nor to guide or monitor treatment for MRSA infections. Performed at [facility], [address] [place], [place] [address]   Resp Panel by RT-PCR (Flu A&B, Covid) Nasopharyngeal Swab     Status: None   Collection Time: 03/05/21  9:43 PM   Specimen: Nasopharyngeal Swab; Nasopharyngeal(NP) swabs in vial transport medium  Result Value Ref Range Status   SARS Coronavirus 2 by RT PCR NEGATIVE NEGATIVE Final  Comment: (NOTE) SARS-CoV-2 target nucleic acids are NOT DETECTED.  The SARS-CoV-2 RNA is generally detectable in upper respiratory specimens during the acute phase of infection. The lowest concentration of SARS-CoV-2 viral copies this assay can detect is 138 copies/mL. A negative result does not preclude SARS-Cov-2 infection and should not be used as the sole basis for treatment or other patient management decisions. A negative result may occur with  improper specimen collection/handling, submission of specimen other than nasopharyngeal swab, presence of viral mutation(s) within the areas targeted by this assay, and inadequate number of viral copies(<138 copies/mL). A negative result must be combined with clinical observations, patient history, and epidemiological information. The expected result is Negative.  Fact Sheet for Patients:  EntrepreneurPulse.com.au  Fact Sheet for Healthcare Providers:  IncredibleEmployment.be  This test is no t yet approved or cleared by the [place] FDA and  has been authorized for detection and/or diagnosis of SARS-CoV-2 by FDA under an Emergency Use  Authorization (EUA). This EUA will remain  in effect (meaning this test can be used) for the duration of the COVID-19 declaration under Section 564(b)(1) of the Act, 21 U.S.C.section 360bbb-3(b)(1), unless the authorization is terminated  or revoked sooner.       Influenza A by PCR NEGATIVE NEGATIVE Final   Influenza B by PCR NEGATIVE NEGATIVE Final    Comment: (NOTE) The Xpert Xpress SARS-CoV-2/FLU/RSV plus assay is intended as an aid in the diagnosis of influenza from Nasopharyngeal swab specimens and should not be used as a sole basis for treatment. Nasal washings and aspirates are unacceptable for Xpert Xpress SARS-CoV-2/FLU/RSV testing.  Fact Sheet for Patients: EntrepreneurPulse.com.au  Fact Sheet for Healthcare Providers: IncredibleEmployment.be  This test is not yet approved or cleared by the [place] FDA and has been authorized for detection and/or diagnosis of SARS-CoV-2 by FDA under an Emergency Use Authorization (EUA). This EUA will remain in effect (meaning this test can be used) for the duration of the COVID-19 declaration under Section 564(b)(1) of the Act, 21 U.S.C. section 360bbb-3(b)(1), unless the authorization is terminated or revoked.  Performed at [facility], [address] [place], [place] [address]   Resp Panel by RT-PCR (Flu A&B, Covid) Nasopharyngeal Swab     Status: None   Collection Time: 03/07/21  9:24 PM   Specimen: Nasopharyngeal Swab; Nasopharyngeal(NP) swabs in vial transport medium  Result Value Ref Range Status   SARS Coronavirus 2 by RT PCR NEGATIVE NEGATIVE Final    Comment: (NOTE) SARS-CoV-2 target nucleic acids are NOT DETECTED.  The SARS-CoV-2 RNA is generally detectable in upper respiratory specimens during the acute phase of infection. The lowest concentration of SARS-CoV-2 viral copies this assay can detect is 138 copies/mL. A negative result does not preclude  SARS-Cov-2 infection and should not be used as the sole basis for treatment or other patient management decisions. A negative result may occur with  improper specimen collection/handling, submission of specimen other than nasopharyngeal swab, presence of viral mutation(s) within the areas targeted by this assay, and inadequate number of viral copies(<138 copies/mL). A negative result must be combined with clinical observations, patient history, and epidemiological information. The expected result is Negative.  Fact Sheet for Patients:  EntrepreneurPulse.com.au  Fact Sheet for Healthcare Providers:  IncredibleEmployment.be  This test is no t yet approved or cleared by the [place] FDA and  has been authorized for detection and/or diagnosis of SARS-CoV-2 by FDA under an Emergency Use Authorization (EUA). This EUA will remain  in effect (  meaning this test can be used) for the duration of the COVID-19 declaration under Section 564(b)(1) of the Act, 21 U.S.C.section 360bbb-3(b)(1), unless the authorization is terminated  or revoked sooner.       Influenza A by PCR NEGATIVE NEGATIVE Final   Influenza B by PCR NEGATIVE NEGATIVE Final    Comment: (NOTE) The Xpert Xpress SARS-CoV-2/FLU/RSV plus assay is intended as an aid in the diagnosis of influenza from Nasopharyngeal swab specimens and should not be used as a sole basis for treatment. Nasal washings and aspirates are unacceptable for Xpert Xpress SARS-CoV-2/FLU/RSV testing.  Fact Sheet for Patients: EntrepreneurPulse.com.au  Fact Sheet for Healthcare Providers: IncredibleEmployment.be  This test is not yet approved or cleared by the [place] FDA and has been authorized for detection and/or diagnosis of SARS-CoV-2 by FDA under an Emergency Use Authorization (EUA). This EUA will remain in effect (meaning this test can be used) for the duration of  the COVID-19 declaration under Section 564(b)(1) of the Act, 21 U.S.C. section 360bbb-3(b)(1), unless the authorization is terminated or revoked.  Performed at [facility], [address] [place], [place] [address]   Blood culture (routine x 2)     Status: None (Preliminary result)   Collection Time: 03/07/21  9:24 PM   Specimen: BLOOD  Result Value Ref Range Status   Specimen Description BLOOD BLOOD RIGHT HAND  Final   Special Requests   Final    BOTTLES DRAWN AEROBIC AND ANAEROBIC Blood Culture adequate volume   Culture   Final    NO GROWTH < 12 HOURS Performed at [facility], [address] [place], [place] [address]    Report Status PENDING  Incomplete  Blood culture (routine x 2)     Status: None (Preliminary result)   Collection Time: 03/07/21  9:24 PM   Specimen: BLOOD  Result Value Ref Range Status   Specimen Description BLOOD RIGHT ANTECUBITAL  Final   Special Requests   Final    BOTTLES DRAWN AEROBIC AND ANAEROBIC Blood Culture adequate volume   Culture   Final    NO GROWTH < 12 HOURS Performed at [facility], [address], [place], [place] [address]    Report Status PENDING  Incomplete       SIGNED:   Athena Masse, MD  [facility] 03/08/2021, 7:45 PM Pager   If 7PM-7AM, please contact night-coverage [email or web address] Password [email or web address]

## 2021-03-08 NOTE — ED Notes (Signed)
Pt wishing to leave AMA pt signed paperwork and assisted out with family. Admit MD and Charge RN aware

## 2021-03-08 NOTE — ED Notes (Addendum)
Pt called out stating trouble breathing. Pt 's O2 at 79%. Pt's 02 not in pt's nose. Pt's O2 repositioned and pt instructed to take deep breaths. Pt's O2 improved 89%. Pt continued to take deep breaths.  Pt informed that this RN would go ahead and get breathing treatment.

## 2021-03-08 NOTE — Telephone Encounter (Signed)
After phone conversation with pt's sister - pt called to request prescription for breathing treatment. Pt states that she already has the nebulizer machine at home and just needs the prescription for the medication. Pt stated that she urgently needs it and cannot wait. Advised pt if her need is that emergent due to her symptoms then it would be best for her to seek immediate evaluation. Pt declined and stated wants to stay at home. Informed pt that have sent messages to provider and NP to get prescription but could take longer than expected since they are in clinic currently. Pt stated that she "could die" before they respond back. At this time, I informed pt if that is how she feels then she needs to go to the ED. She declined again.   Josh advised to send in duoneb q6hrs prn. Rx sent into [facility]. Pt made aware, said thanks, and hung up the phone immediately.

## 2021-03-08 NOTE — ED Notes (Signed)
Pt stating she is not staying and wishes to leave. Admit MD informed and pt updated and still wishes to leave.

## 2021-03-08 NOTE — Addendum Note (Signed)
Addended by: Telford Nab on: 03/08/2021 11:26 AM   Modules accepted: Orders

## 2021-03-08 NOTE — ED Notes (Signed)
Pt finished breathing treatment and feels better, O2 at 98% Nellie 2L

## 2021-03-09 ENCOUNTER — Other Ambulatory Visit: Payer: Self-pay | Admitting: Oncology

## 2021-03-09 ENCOUNTER — Telehealth: Payer: Self-pay | Admitting: *Deleted

## 2021-03-09 MED ORDER — MAGIC MOUTHWASH: 5.0000 mL | Freq: Three times a day (TID) | ORAL | 1 refills | Status: AC | PRN

## 2021-03-09 MED ORDER — LEVOFLOXACIN 750 MG PO TABS
750.0000 mg | ORAL_TABLET | Freq: Every day | ORAL | 0 refills | Status: DC
Start: 2021-03-09 — End: 2021-03-15

## 2021-03-09 NOTE — Telephone Encounter (Signed)
Please see phone note from [facility], RN on 03/09/21.

## 2021-03-09 NOTE — Telephone Encounter (Signed)
Sister called reporting that patient is having trouble swallowing, a sore throat stating the "it feels like glass" when swallowing and is request something be done for patient.

## 2021-03-09 NOTE — Telephone Encounter (Signed)
Received call from pt's sister requesting medication to help with mouth pain and soreness. Also asking if pt needs to continue taking any antibiotics since she still has a productive cough. Per Dr. Tasia Catchings will send in rx for magic mouthwash and levaquin.   Magic mouthwash called into [facility] and Dr. Tasia Catchings sent in levaquin by escribed.   Message left with pt's sister to make aware of new prescriptions sent into pharmacy.

## 2021-03-09 NOTE — Patient Instructions (Signed)
Pemetrexed injection What is this medicine? PEMETREXED (PEM e TREX ed) is a chemotherapy drug used to treat lung cancers like non-small cell lung cancer and mesothelioma. It may also be used to treat other cancers. This medicine may be used for other purposes; ask your health care provider or pharmacist if you have questions. COMMON BRAND NAME(S): Alimta What should I tell my health care provider before I take this medicine? They need to know if you have any of these conditions:  infection (especially a virus infection such as chickenpox, cold sores, or herpes)  kidney disease  low blood counts, like low white cell, platelet, or red cell counts  lung or breathing disease, like asthma  radiation therapy  an unusual or allergic reaction to pemetrexed, other medicines, foods, dyes, or preservative  pregnant or trying to get pregnant  breast-feeding How should I use this medicine? This drug is given as an infusion into a vein. It is administered in a hospital or clinic by a specially trained health care professional. Talk to your pediatrician regarding the use of this medicine in children. Special care may be needed. Overdosage: If you think you have taken too much of this medicine contact a poison control center or emergency room at once. NOTE: This medicine is only for you. Do not share this medicine with others. What if I miss a dose? It is important not to miss your dose. Call your doctor or health care professional if you are unable to keep an appointment. What may interact with this medicine? This medicine may interact with the following medications:  Ibuprofen This list may not describe all possible interactions. Give your health care provider a list of all the medicines, herbs, non-prescription drugs, or dietary supplements you use. Also tell them if you smoke, drink alcohol, or use illegal drugs. Some items may interact with your medicine. What should I watch for while using  this medicine? Visit your doctor for checks on your progress. This drug may make you feel generally unwell. This is not uncommon, as chemotherapy can affect healthy cells as well as cancer cells. Report any side effects. Continue your course of treatment even though you feel ill unless your doctor tells you to stop. In some cases, you may be given additional medicines to help with side effects. Follow all directions for their use. Call your doctor or health care professional for advice if you get a fever, chills or sore throat, or other symptoms of a cold or flu. Do not treat yourself. This drug decreases your body's ability to fight infections. Try to avoid being around people who are sick. This medicine may increase your risk to bruise or bleed. Call your doctor or health care professional if you notice any unusual bleeding. Be careful brushing and flossing your teeth or using a toothpick because you may get an infection or bleed more easily. If you have any dental work done, tell your dentist you are receiving this medicine. Avoid taking products that contain aspirin, acetaminophen, ibuprofen, naproxen, or ketoprofen unless instructed by your doctor. These medicines may hide a fever. Call your doctor or health care professional if you get diarrhea or mouth sores. Do not treat yourself. To protect your kidneys, drink water or other fluids as directed while you are taking this medicine. Do not become pregnant while taking this medicine or for 6 months after stopping it. Women should inform their doctor if they wish to become pregnant or think they might be pregnant. Men should  not father a child while taking this medicine and for 3 months after stopping it. This may interfere with the ability to father a child. You should talk to your doctor or health care professional if you are concerned about your fertility. There is a potential for serious side effects to an unborn child. Talk to your health care  professional or pharmacist for more information. Do not breast-feed an infant while taking this medicine or for 1 week after stopping it. What side effects may I notice from receiving this medicine? Side effects that you should report to your doctor or health care professional as soon as possible:  allergic reactions like skin rash, itching or hives, swelling of the face, lips, or tongue  breathing problems  redness, blistering, peeling or loosening of the skin, including inside the mouth  signs and symptoms of bleeding such as bloody or black, tarry stools; red or dark-brown urine; spitting up blood or brown material that looks like coffee grounds; red spots on the skin; unusual bruising or bleeding from the eye, gums, or nose  signs and symptoms of infection like fever or chills; cough; sore throat; pain or trouble passing urine  signs and symptoms of kidney injury like trouble passing urine or change in the amount of urine  signs and symptoms of liver injury like dark yellow or brown urine; general ill feeling or flu-like symptoms; light-colored stools; loss of appetite; nausea; right upper belly pain; unusually weak or tired; yellowing of the eyes or skin Side effects that usually do not require medical attention (report to your doctor or health care professional if they continue or are bothersome):  constipation  mouth sores  nausea, vomiting  unusually weak or tired This list may not describe all possible side effects. Call your doctor for medical advice about side effects. You may report side effects to FDA at 1-800-FDA-1088. Where should I keep my medicine? This drug is given in a hospital or clinic and will not be stored at home. NOTE: This sheet is a summary. It may not cover all possible information. If you have questions about this medicine, talk to your doctor, pharmacist, or health care provider.  2021 Elsevier/Gold Standard (2018-01-09 16:11:33) Carboplatin  injection What is this medicine? CARBOPLATIN (KAR boe pla tin) is a chemotherapy drug. It targets fast dividing cells, like cancer cells, and causes these cells to die. This medicine is used to treat ovarian cancer and many other cancers. This medicine may be used for other purposes; ask your health care provider or pharmacist if you have questions. COMMON BRAND NAME(S): Paraplatin What should I tell my health care provider before I take this medicine? They need to know if you have any of these conditions:  blood disorders  hearing problems  kidney disease  recent or ongoing radiation therapy  an unusual or allergic reaction to carboplatin, cisplatin, other chemotherapy, other medicines, foods, dyes, or preservatives  pregnant or trying to get pregnant  breast-feeding How should I use this medicine? This drug is usually given as an infusion into a vein. It is administered in a hospital or clinic by a specially trained health care professional. Talk to your pediatrician regarding the use of this medicine in children. Special care may be needed. Overdosage: If you think you have taken too much of this medicine contact a poison control center or emergency room at once. NOTE: This medicine is only for you. Do not share this medicine with others. What if I miss a dose?  It is important not to miss a dose. Call your doctor or health care professional if you are unable to keep an appointment. What may interact with this medicine?  medicines for seizures  medicines to increase blood counts like filgrastim, pegfilgrastim, sargramostim  some antibiotics like amikacin, gentamicin, neomycin, streptomycin, tobramycin  vaccines Talk to your doctor or health care professional before taking any of these medicines:  acetaminophen  aspirin  ibuprofen  ketoprofen  naproxen This list may not describe all possible interactions. Give your health care provider a list of all the medicines,  herbs, non-prescription drugs, or dietary supplements you use. Also tell them if you smoke, drink alcohol, or use illegal drugs. Some items may interact with your medicine. What should I watch for while using this medicine? Your condition will be monitored carefully while you are receiving this medicine. You will need important blood work done while you are taking this medicine. This drug may make you feel generally unwell. This is not uncommon, as chemotherapy can affect healthy cells as well as cancer cells. Report any side effects. Continue your course of treatment even though you feel ill unless your doctor tells you to stop. In some cases, you may be given additional medicines to help with side effects. Follow all directions for their use. Call your doctor or health care professional for advice if you get a fever, chills or sore throat, or other symptoms of a cold or flu. Do not treat yourself. This drug decreases your body's ability to fight infections. Try to avoid being around people who are sick. This medicine may increase your risk to bruise or bleed. Call your doctor or health care professional if you notice any unusual bleeding. Be careful brushing and flossing your teeth or using a toothpick because you may get an infection or bleed more easily. If you have any dental work done, tell your dentist you are receiving this medicine. Avoid taking products that contain aspirin, acetaminophen, ibuprofen, naproxen, or ketoprofen unless instructed by your doctor. These medicines may hide a fever. Do not become pregnant while taking this medicine. Women should inform their doctor if they wish to become pregnant or think they might be pregnant. There is a potential for serious side effects to an unborn child. Talk to your health care professional or pharmacist for more information. Do not breast-feed an infant while taking this medicine. What side effects may I notice from receiving this medicine? Side  effects that you should report to your doctor or health care professional as soon as possible:  allergic reactions like skin rash, itching or hives, swelling of the face, lips, or tongue  signs of infection - fever or chills, cough, sore throat, pain or difficulty passing urine  signs of decreased platelets or bleeding - bruising, pinpoint red spots on the skin, black, tarry stools, nosebleeds  signs of decreased red blood cells - unusually weak or tired, fainting spells, lightheadedness  breathing problems  changes in hearing  changes in vision  chest pain  high blood pressure  low blood counts - This drug may decrease the number of white blood cells, red blood cells and platelets. You may be at increased risk for infections and bleeding.  nausea and vomiting  pain, swelling, redness or irritation at the injection site  pain, tingling, numbness in the hands or feet  problems with balance, talking, walking  trouble passing urine or change in the amount of urine Side effects that usually do not require medical attention (  report to your doctor or health care professional if they continue or are bothersome):  hair loss  loss of appetite  metallic taste in the mouth or changes in taste This list may not describe all possible side effects. Call your doctor for medical advice about side effects. You may report side effects to FDA at 1-800-FDA-1088. Where should I keep my medicine? This drug is given in a hospital or clinic and will not be stored at home. NOTE: This sheet is a summary. It may not cover all possible information. If you have questions about this medicine, talk to your doctor, pharmacist, or health care provider.  2021 Elsevier/Gold Standard (2008-02-25 14:38:05)

## 2021-03-10 ENCOUNTER — Telehealth: Payer: Self-pay

## 2021-03-10 ENCOUNTER — Inpatient Hospital Stay: Payer: Medicaid Other

## 2021-03-10 NOTE — Telephone Encounter (Addendum)
Patient did not come to New Patient Education class but sister told Hayley she would come.   No one showed up and I attempted to call sister [name] to try and do a Control and instrumentation engineer but no answer.   Left message and asked [name] to call me so I can do chemo education.   Waiting for call back.   Sister [name] did call back and we did a New Patient Education class Virtually.  Sister came to pick up binder, goody bag and handmade quilt.

## 2021-03-12 LAB — CULTURE, BLOOD (ROUTINE X 2)
Culture: NO GROWTH
Culture: NO GROWTH
Special Requests: ADEQUATE
Special Requests: ADEQUATE

## 2021-03-14 ENCOUNTER — Ambulatory Visit: Payer: Medicaid Other

## 2021-03-14 ENCOUNTER — Telehealth (INDEPENDENT_AMBULATORY_CARE_PROVIDER_SITE_OTHER): Payer: Self-pay

## 2021-03-14 ENCOUNTER — Inpatient Hospital Stay (HOSPITAL_BASED_OUTPATIENT_CLINIC_OR_DEPARTMENT_OTHER): Payer: Medicaid Other | Admitting: Oncology

## 2021-03-14 ENCOUNTER — Encounter: Payer: Self-pay | Admitting: Oncology

## 2021-03-14 ENCOUNTER — Telehealth: Payer: Self-pay

## 2021-03-14 ENCOUNTER — Encounter: Payer: Self-pay | Admitting: *Deleted

## 2021-03-14 ENCOUNTER — Other Ambulatory Visit: Payer: Self-pay

## 2021-03-14 ENCOUNTER — Inpatient Hospital Stay: Payer: Medicaid Other

## 2021-03-14 VITALS — BP 112/74 | HR 102 | Temp 98.4°F | Resp 20 | Wt 111.7 lb

## 2021-03-14 DIAGNOSIS — E876 Hypokalemia: Secondary | ICD-10-CM

## 2021-03-14 DIAGNOSIS — Z7189 Other specified counseling: Secondary | ICD-10-CM

## 2021-03-14 DIAGNOSIS — C3490 Malignant neoplasm of unspecified part of unspecified bronchus or lung: Secondary | ICD-10-CM

## 2021-03-14 DIAGNOSIS — G893 Neoplasm related pain (acute) (chronic): Secondary | ICD-10-CM | POA: Diagnosis not present

## 2021-03-14 DIAGNOSIS — M899 Disorder of bone, unspecified: Secondary | ICD-10-CM

## 2021-03-14 DIAGNOSIS — C3480 Malignant neoplasm of overlapping sites of unspecified bronchus and lung: Secondary | ICD-10-CM

## 2021-03-14 DIAGNOSIS — C7931 Secondary malignant neoplasm of brain: Secondary | ICD-10-CM | POA: Diagnosis not present

## 2021-03-14 LAB — CBC WITH DIFFERENTIAL/PLATELET
Abs Immature Granulocytes: 0.29 10*3/uL — ABNORMAL HIGH (ref 0.00–0.07)
Basophils Absolute: 0 10*3/uL (ref 0.0–0.1)
Basophils Relative: 0 %
Eosinophils Absolute: 0.2 10*3/uL (ref 0.0–0.5)
Eosinophils Relative: 1 %
HCT: 30.3 % — ABNORMAL LOW (ref 36.0–46.0)
Hemoglobin: 9.7 g/dL — ABNORMAL LOW (ref 12.0–15.0)
Immature Granulocytes: 1 %
Lymphocytes Relative: 7 %
Lymphs Abs: 1.9 10*3/uL (ref 0.7–4.0)
MCH: 28 pg (ref 26.0–34.0)
MCHC: 32 g/dL (ref 30.0–36.0)
MCV: 87.6 fL (ref 80.0–100.0)
Monocytes Absolute: 0.9 10*3/uL (ref 0.1–1.0)
Monocytes Relative: 3 %
Neutro Abs: 24.3 10*3/uL — ABNORMAL HIGH (ref 1.7–7.7)
Neutrophils Relative %: 88 %
Platelets: 122 10*3/uL — ABNORMAL LOW (ref 150–400)
RBC: 3.46 MIL/uL — ABNORMAL LOW (ref 3.87–5.11)
RDW: 15 % (ref 11.5–15.5)
WBC: 27.6 10*3/uL — ABNORMAL HIGH (ref 4.0–10.5)
nRBC: 0 % (ref 0.0–0.2)

## 2021-03-14 LAB — COMPREHENSIVE METABOLIC PANEL
ALT: 30 U/L (ref 0–44)
AST: 30 U/L (ref 15–41)
Albumin: 3.3 g/dL — ABNORMAL LOW (ref 3.5–5.0)
Alkaline Phosphatase: 331 U/L — ABNORMAL HIGH (ref 38–126)
Anion gap: 13 (ref 5–15)
BUN: 30 mg/dL — ABNORMAL HIGH (ref 6–20)
CO2: 26 mmol/L (ref 22–32)
Calcium: 8.4 mg/dL — ABNORMAL LOW (ref 8.9–10.3)
Chloride: 100 mmol/L (ref 98–111)
Creatinine, Ser: 1.02 mg/dL — ABNORMAL HIGH (ref 0.44–1.00)
GFR, Estimated: >60 mL/min (ref >60)
Glucose, Bld: 127 mg/dL — ABNORMAL HIGH (ref 70–99)
Potassium: 2.6 mmol/L — CL (ref 3.5–5.1)
Sodium: 139 mmol/L (ref 135–145)
Total Bilirubin: 0.5 mg/dL (ref 0.3–1.2)
Total Protein: 7 g/dL (ref 6.5–8.1)

## 2021-03-14 LAB — MAGNESIUM: Magnesium: 1.7 mg/dL (ref 1.7–2.4)

## 2021-03-14 MED ORDER — SODIUM CHLORIDE 0.9 % IV SOLN
500.0000 mg/m2 | Freq: Once | INTRAVENOUS | Status: AC
  Administered 2021-03-14: 800 mg via INTRAVENOUS
  Filled 2021-03-14: qty 20

## 2021-03-14 MED ORDER — POTASSIUM CHLORIDE CRYS ER 20 MEQ PO TBCR: 40.0000 meq | EXTENDED_RELEASE_TABLET | Freq: Two times a day (BID) | ORAL | 0 refills | Status: DC

## 2021-03-14 MED ORDER — SODIUM CHLORIDE 0.9 % IV SOLN
292.0000 mg | Freq: Once | INTRAVENOUS | Status: AC
  Administered 2021-03-14: 290 mg via INTRAVENOUS
  Filled 2021-03-14: qty 29

## 2021-03-14 MED ORDER — SODIUM CHLORIDE 0.9 % IV SOLN
Freq: Once | INTRAVENOUS | Status: AC
  Filled 2021-03-14: qty 250

## 2021-03-14 MED ORDER — SODIUM CHLORIDE 0.9 % IV SOLN
150.0000 mg | Freq: Once | INTRAVENOUS | Status: AC
  Administered 2021-03-14: 150 mg via INTRAVENOUS
  Filled 2021-03-14: qty 150

## 2021-03-14 MED ORDER — HEPARIN SOD (PORK) LOCK FLUSH 100 UNIT/ML IV SOLN
500.0000 [IU] | Freq: Once | INTRAVENOUS | Status: DC | PRN
  Filled 2021-03-14: qty 5

## 2021-03-14 MED ORDER — PALONOSETRON HCL INJECTION 0.25 MG/5ML
0.2500 mg | Freq: Once | INTRAVENOUS | Status: AC
  Administered 2021-03-14: 0.25 mg via INTRAVENOUS
  Filled 2021-03-14: qty 5

## 2021-03-14 MED ORDER — SODIUM CHLORIDE 0.9 % IV SOLN
10.0000 mg | Freq: Once | INTRAVENOUS | Status: AC
  Administered 2021-03-14: 10 mg via INTRAVENOUS
  Filled 2021-03-14: qty 10

## 2021-03-14 MED ORDER — POTASSIUM CHLORIDE CRYS ER 20 MEQ PO TBCR: 40.0000 meq | EXTENDED_RELEASE_TABLET | Freq: Every day | ORAL | 0 refills | Status: DC

## 2021-03-14 MED ORDER — POTASSIUM CHLORIDE IN NACL 40-0.9 MEQ/L-% IV SOLN
Freq: Once | INTRAVENOUS | Status: AC
  Filled 2021-03-14: qty 1000

## 2021-03-14 NOTE — Telephone Encounter (Signed)
Potassium was sent to pharmacy as 21mEq BID. Pt is only suppose to take 40 mEq daily. [place], sister, who has already picked up medication and updated her on new instructions. She repeated new instructions back to me and voiced understanding.

## 2021-03-14 NOTE — Progress Notes (Signed)
HR 102 ok to proceed per MD °

## 2021-03-14 NOTE — Progress Notes (Signed)
Pt has some concerns due to a spot on the back of her neck c/o pain will come and go. Spot was painful to the touch, but as of right now it is not hurting. Sores in pts mouth, difficulty swallowing believes it is due to nebulizer. Edema in her feet, comes and goes. Will also need dental clearance for ZOMETA.

## 2021-03-14 NOTE — Telephone Encounter (Signed)
Spoke with the patient's sister trying to find the right day to schedule her for a port placement. They will call back to schedule.

## 2021-03-14 NOTE — Progress Notes (Signed)
Hematology/Oncology Follow Up Note [facility]  Telephone:([phone]) [phone] [phone]  Patient Care Team: Remi Haggard, FNP as PCP - General (Family Medicine) Remi Haggard, FNP (Family Medicine) Christene Lye, MD (General Surgery) Telford Nab, RN as Oncology Nurse Navigator   Name of the patient: Brenda Middleton  [id number]  1961/07/28   REASON FOR VISIT Metastatic lung adenoacarcinoma  PERTINENT ONCOLOGY HISTORY Katira Dumais Brenda Middleton is a 60 y.o.afemale who has above oncology history reviewed by me today presented for follow up visit for management of metastatic lung cancer. 02/15/2021, CT head showed aggressive 5.5 cm mass lesion involving the left occipital calvarium with bony destruction and expansion and extension inferior to involve the left temporal bone and skull base.  Mass-effect on the left cerebellum and possible involvement of the subjacent dural venous sinuses. 02/15/2021, MRI brain confirmed a destructive mass lesion occipital bone bilaterally left greater than right, associated soft tissue mass in the left occipital bone extending to the posterior fossa with mass-effect and mild edema of the left cerebellum.  Mass extends into the left temporal bone.  Subtle enhancing lesion right frontal lobe 10 mm and 4 mm.  Compatible with metastatic disease. 02/15/2021 CT chest abdomen pelvis showed 2.7 cm right middle lobe pulmonary nodule, with extensive lesions lymphadenopathy including axillary node, subpectoral node, left supraclavicular node, right paratracheal node, right precarinal node, right hilar node, scattered pulmonary nodules, multiple liver lesions, pathological fracture of L2 Patient was evaluated by Dr. Lacinda Axon will recommend no surgical intervention.  Recommend biopsy of body lesions.  And radiation 02/18/2021 CT thoracic and lumbar spine today showed a sclerotic and lytic metastatic lesion, pathological L2 fracture with less than  50% loss of height, probable pathological fracture at T9 without height loss. 02/21/2021 Left supraclavicular lymph node biopsy showed metastatic adenocarcinoma, compatible with lung primary.     INTERVAL HISTORY Brenda Middleton is a 60 y.o. female who has above history reviewed by me today presents for follow up visit for management of metastatic lung adenocarcinoma Problems and complaints are listed below: Patient was on Dexamethasone, and pain was controlled with fentanyl patch and PRN pertcocet.  She was accompanied by sister [place].   During the interval she called to report persistent productive cough, and was started on Empiric Levaquin.  Mouth pain, I recommend her to start magic mouth wash.  Today no nausea, vomiting, diarrhea.  persistent cough and chronic sob.   Review of Systems  Constitutional: Negative for appetite change, chills, fatigue and fever.  HENT:   Positive for voice change. Negative for hearing loss.   Eyes: Negative for eye problems.  Respiratory: Positive for cough and shortness of breath. Negative for chest tightness.   Cardiovascular: Negative for chest pain.  Gastrointestinal: Negative for abdominal distention, abdominal pain and blood in stool.  Endocrine: Negative for hot flashes.  Genitourinary: Negative for difficulty urinating and frequency.   Musculoskeletal: Negative for arthralgias.  Skin: Negative for itching and rash.  Neurological: Negative for extremity weakness.  Hematological: Negative for adenopathy.  Psychiatric/Behavioral: Negative for confusion.      Allergies  Allergen Reactions  . Penicillin G Hives    Other reaction(s): HIVES Other reaction(s): HIVES  . Penicillin V Itching  . Penicillins      Past Medical History:  Diagnosis Date  . Anxiety   . Benign neoplasm of cervix uteri   . Chronic hepatitis C without mention of hepatic coma   . Chronic low back  pain 08/26/2014  . Constipation   . Depressive disorder   .  Displacement of cervical intervertebral disc   . Hypertensive disorder   . Iron deficiency anemia due to chronic blood loss 09/12/2019  . Palpitations   . Prolapsed cervical intervertebral disc      Past Surgical History:  Procedure Laterality Date  . CONIZATION CERVIX    . DILATION AND CURETTAGE OF UTERUS    . HAND SURGERY  2008,2015   Carpel Tunnel  . TONSILLECTOMY    . VULVECTOMY      Social History   Socioeconomic History  . Marital status: Single    Spouse name: Not on file  . Number of children: 0  . Years of education: college1  . Highest education level: Not on file  Occupational History    Employer: UNEMPLOYED  Tobacco Use  . Smoking status: Former Smoker    Packs/day: 0.70    Years: 35.00    Pack years: 24.50    Types: Cigarettes  . Smokeless tobacco: Never Used  Vaping Use  . Vaping Use: Never used  Substance and Sexual Activity  . Alcohol use: Yes    Comment: occasional wine  . Drug use: No  . Sexual activity: Not Currently  Other Topics Concern  . Not on file  Social History Narrative  . Not on file   Social Determinants of Health   Financial Resource Strain: Not on file  Food Insecurity: Not on file  Transportation Needs: Not on file  Physical Activity: Not on file  Stress: Not on file  Social Connections: Not on file  Intimate Partner Violence: Not on file    Family History  Problem Relation Age of Onset  . Breast cancer Mother 37  . Lung cancer Father   . Cancer Brother   . Cancer Other   . Stroke Other      Current Outpatient Medications:  .  albuterol (VENTOLIN HFA) 108 (90 Base) MCG/ACT inhaler, Inhale 2 puffs into the lungs every 4 (four) hours as needed for wheezing or shortness of breath., Disp: 6.7 g, Rfl: 1 .  budesonide-formoterol (SYMBICORT) 80-4.5 MCG/ACT inhaler, Inhale 2 puffs into the lungs 2 (two) times daily., Disp: , Rfl:  .  busPIRone (BUSPAR) 10 MG tablet, Take 1 tablet (10 mg total) by mouth 2 (two) times  daily., Disp: 60 tablet, Rfl: 2 .  dexamethasone (DECADRON) 4 MG tablet, Take 1 tablet (4 mg total) by mouth 2 (two) times daily. (Patient not taking: Reported on 03/07/2021), Disp: 60 tablet, Rfl: 1 .  fentaNYL (DURAGESIC) 50 MCG/HR, Place 1 patch onto the skin every 3 (three) days., Disp: 10 patch, Rfl: 0 .  folic acid (FOLVITE) 1 MG tablet, Take 1 tablet (1 mg total) by mouth daily. Start 5-7 days before Alimta chemotherapy. Continue until 21 days after Alimta completed. (Patient not taking: Reported on 03/07/2021), Disp: 100 tablet, Rfl: 3 .  furosemide (LASIX) 20 MG tablet, Take 20 mg by mouth 2 (two) times daily., Disp: , Rfl:  .  gabapentin (NEURONTIN) 300 MG capsule, Take 300 mg by mouth daily., Disp: , Rfl:  .  guaiFENesin (MUCINEX) 600 MG 12 hr tablet, Take 2 tablets (1,200 mg total) by mouth 2 (two) times daily., Disp: 30 tablet, Rfl: 0 .  hydrOXYzine (ATARAX/VISTARIL) 25 MG tablet, hydroxyzine HCl 25 mg tablet, Disp: , Rfl:  .  ipratropium-albuterol (DUONEB) 0.5-2.5 (3) MG/3ML SOLN, Take 3 mLs by nebulization every 6 (six) hours as needed., Disp: 360  mL, Rfl: 3 .  levETIRAcetam (KEPPRA) 500 MG tablet, Take 1 tablet (500 mg total) by mouth 2 (two) times daily., Disp: 60 tablet, Rfl: 2 .  levofloxacin (LEVAQUIN) 750 MG tablet, Take 1 tablet (750 mg total) by mouth daily., Disp: 5 tablet, Rfl: 0 .  lidocaine-prilocaine (EMLA) cream, Apply to affected area once, Disp: 30 g, Rfl: 3 .  magic mouthwash SOLN, Take 5 mLs by mouth 3 (three) times daily as needed for mouth pain., Disp: 300 mL, Rfl: 1 .  metoprolol tartrate (LOPRESSOR) 25 MG tablet, Take 0.5 tablets (12.5 mg total) by mouth 2 (two) times daily., Disp: 30 tablet, Rfl: 3 .  oxyCODONE-acetaminophen (PERCOCET/ROXICET) 5-325 MG tablet, Take 1-2 tablets by mouth every 4 (four) hours as needed for severe pain., Disp: 60 tablet, Rfl: 0 .  pantoprazole (PROTONIX) 40 MG tablet, Take 40 mg by mouth daily., Disp: , Rfl:  .  prochlorperazine  (COMPAZINE) 10 MG tablet, Take 1 tablet (10 mg total) by mouth every 6 (six) hours as needed (Nausea or vomiting). (Patient not taking: Reported on 03/07/2021), Disp: 30 tablet, Rfl: 1 .  promethazine (PHENERGAN) 12.5 MG tablet, Take 1 tablet (12.5 mg total) by mouth every 6 (six) hours as needed for nausea or vomiting., Disp: 60 tablet, Rfl: 2 .  QUEtiapine (SEROQUEL) 400 MG tablet, Take 400 mg by mouth at bedtime., Disp: , Rfl:   Physical exam: ECOG 2 Vitals:   03/14/21 0839  Weight: 111 lb 11.2 oz (50.7 kg)   Physical Exam Constitutional:      General: She is not in acute distress.    Comments: She sits in the wheelchair.   HENT:     Head: Normocephalic and atraumatic.     Mouth/Throat:     Comments: Horseness Eyes:     General: No scleral icterus. Cardiovascular:     Rate and Rhythm: Normal rate and regular rhythm.     Heart sounds: Normal heart sounds.  Pulmonary:     Effort: Pulmonary effort is normal. No respiratory distress.     Breath sounds: No wheezing.     Comments: Crackles bibasilar Abdominal:     General: Bowel sounds are normal. There is no distension.     Palpations: Abdomen is soft.  Musculoskeletal:        General: No deformity. Normal range of motion.     Cervical back: Normal range of motion and neck supple.  Skin:    General: Skin is warm and dry.     Findings: No erythema or rash.  Neurological:     Mental Status: She is alert and oriented to person, place, and time. Mental status is at baseline.     Sensory: No sensory deficit.  Psychiatric:     Comments: anxious     CMP Latest Ref Rng & Units 03/08/2021  Glucose 70 - 99 mg/dL 181(H)  BUN 6 - 20 mg/dL 30(H)  Creatinine 0.44 - 1.00 mg/dL 0.71  Sodium 135 - 145 mmol/L 140  Potassium 3.5 - 5.1 mmol/L 4.4  Chloride 98 - 111 mmol/L 106  CO2 22 - 32 mmol/L 25  Calcium 8.9 - 10.3 mg/dL 8.0(L)  Total Protein 6.5 - 8.1 g/dL -  Total Bilirubin 0.3 - 1.2 mg/dL -  Alkaline Phos 38 - 126 U/L -  AST 15 -  41 U/L -  ALT 0 - 44 U/L -   CBC Latest Ref Rng & Units 03/14/2021  WBC 4.0 - 10.5 K/uL 27.6(H)  Hemoglobin 12.0 -  15.0 g/dL 9.7(L)  Hematocrit 36.0 - 46.0 % 30.3(L)  Platelets 150 - 400 K/uL 122(L)    RADIOGRAPHIC STUDIES: I have personally reviewed the radiological images as listed and agreed with the findings in the report. DG Chest 2 View  Result Date: 03/05/2021 CLINICAL DATA:  Shortness of breath EXAM: CHEST - 2 VIEW COMPARISON:  02/27/2021 FINDINGS: Heart is normal size. Right upper lobe mass again noted as seen on CT. Patchy bilateral airspace disease again noted, similar to prior study. No effusions or acute bony abnormality. IMPRESSION: Patchy bilateral airspace disease again noted, not significantly changed. Right upper lobe mass again seen. Electronically Signed   By: Rolm Baptise M.D.   On: 03/05/2021 21:42   DG Chest 2 View  Result Date: 02/15/2021 CLINICAL DATA:  Weight loss and cough EXAM: CHEST - 2 VIEW COMPARISON:  None. FINDINGS: The heart size and mediastinal contours are within normal limits. Rounded patchy nodular opacity seen within the right upper lobe and right mid lung. The left lung is clear. No acute osseous abnormality. IMPRESSION: Rounded patchy airspace opacities in the right mid lung which may be due to infectious etiology, however cannot exclude metastatic disease. Would recommend CT chest with contrast for further evaluation. Electronically Signed   By: Prudencio Pair M.D.   On: 02/15/2021 12:16   CT Head Wo Contrast  Result Date: 02/15/2021 CLINICAL DATA:  Headache with left-sided face/neck/head pain. On antibiotics for reported laryngitis with bilateral ear pain. EXAM: CT HEAD WITHOUT CONTRAST CT MAXILLOFACIAL WITHOUT CONTRAST TECHNIQUE: Multidetector CT imaging of the head and maxillofacial structures were performed using the standard protocol without intravenous contrast. Multiplanar CT image reconstructions of the maxillofacial structures were also generated.  COMPARISON:  None. FINDINGS: CT HEAD FINDINGS Brain: The calvarial/skull base mass lesion is described below. Spur this process exerts mass effect on the left cerebellum with suspected narrowing of the fourth ventricle. No evidence of hydrocephalus. No evidence of acute large vascular territory infarct. No acute hemorrhage. Mild to moderate scattered white matter hypodensities, which are nonspecific but most likely relate to chronic microvascular ischemic disease. No midline shift. Vascular: No hyperdense vessel identified. Calcific atherosclerosis. Skull/skull base: There is aggressive soft tissue mass lesion involving the left occipital calvarium measuring up to approximately 5.5 x 3.1 by 3.3 cm (transverse by AP by craniocaudal) with bony destruction and expansion and extension inferiorly to involve the left temporal bone and skull base. There is mass effect on the inferior left cerebellum and possible involvement of the subjacent dural venous sinuses. Small left mastoid effusion. CT MAXILLOFACIAL FINDINGS Osseous: No fracture or mandibular dislocation. No destructive process in the face. Orbits: Negative. No traumatic or inflammatory finding. Sinuses: Small right maxillary sinus retention cyst. Otherwise, sinuses are clear. Soft tissues: Negative. 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 Resulting mass effect on the left cerebellum and possible involvement of the subjacent dural venous sinuses. 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 Osteomyelitis is a differential consideration. Recommend MRI brain with and without contrast to further evaluate. Findings and recommendations discussed with Ashok Cordia, PA via telephone at 11:20 a.m. Electronically Signed   By: Margaretha Sheffield MD   On: 02/15/2021 11:29   CT CHEST W  CONTRAST  Result Date: 03/06/2021 CLINICAL DATA:  Respiratory failure, shortness of breath EXAM: CT CHEST WITH CONTRAST TECHNIQUE: Multidetector CT  imaging of the chest was performed during intravenous contrast administration. CONTRAST:  37mL OMNIPAQUE IOHEXOL 300 MG/ML  SOLN COMPARISON:  02/27/2021.  Chest x-ray 03/05/2021 FINDINGS: Cardiovascular: Heart is normal size. Coronary artery and aortic calcifications. No aneurysm. Mediastinum/Nodes: Bulky mediastinal and bilateral hilar adenopathy again noted, unchanged. Trachea is patent. Thyroid unremarkable. Lungs/Pleura: Ground-glass airspace opacities in the lower lobes have worsened since prior study, particularly in the left lower lobe. Ground-glass opacities in the upper lobes somewhat improved since prior study. Numerous bilateral pulmonary nodules are stable. Right upper lobe mass again noted and stable. No effusions. Upper Abdomen: Imaging into the upper abdomen demonstrates no acute findings. Musculoskeletal: Chest wall soft tissues are unremarkable. Multiple osseous metastases again noted, unchanged. IMPRESSION: Redemonstrated is extensive metastatic disease in the chest with numerous pulmonary nodules, dominant right upper lobe mass, and bulky mediastinal/bilateral hilar adenopathy. Ground-glass opacities have increased in the lower lobes, left greater than right, likely infectious/inflammatory. Somewhat improvement in ground-glass opacities in the upper lobe since prior study. Stable osseous metastatic disease. Coronary artery disease. Aortic Atherosclerosis (ICD10-I70.0). Electronically Signed   By: Rolm Baptise M.D.   On: 03/06/2021 00:11   CT Angio Chest PE W and/or Wo Contrast  Result Date: 02/27/2021 CLINICAL DATA:  PE suspected, widely metastatic lung cancer EXAM: CT ANGIOGRAPHY CHEST WITH CONTRAST TECHNIQUE: Multidetector CT imaging of the chest was performed using the standard protocol during bolus administration of intravenous contrast.  Multiplanar CT image reconstructions and MIPs were obtained to evaluate the vascular anatomy. CONTRAST:  78mL OMNIPAQUE IOHEXOL 350 MG/ML SOLN COMPARISON:  02/15/2021 FINDINGS: Cardiovascular: Satisfactory opacification of the pulmonary arteries to the segmental level. No evidence of pulmonary embolism. Cardiomegaly. Left and right coronary artery calcifications and stents. No pericardial effusion. Mediastinum/Nodes: Redemonstrated, very extensive bulky bilateral hilar, mediastinal, supraclavicular, and left axillary lymphadenopathy. Thyroid gland, trachea, and esophagus demonstrate no significant findings. Lungs/Pleura: Diffuse bilateral bronchial wall thickening. Spiculated right upper lobe mass as seen on prior examination (series 7, image 42). Numerous redemonstrated small pulmonary nodules throughout the lungs. There is new, extensive ground-glass airspace opacity throughout the lungs, somewhat geographic in the upper lobes (series 7, image 33), although somewhat more nodular appearing in the lower lungs (series 7, image 58). No pleural effusion or pneumothorax. Upper Abdomen: No acute abnormality. Multiple low-attenuation lesions of liver, better assessed by prior dedicated of the abdomen. Musculoskeletal: No chest wall abnormality. Multiple osseous metastatic lesions, most significantly a lytic lesion of the T9 vertebral body (series 9, image 58). Review of the MIP images confirms the above findings. IMPRESSION: 1. Negative examination for pulmonary embolism. 2. There is new, extensive ground-glass airspace opacity throughout the lungs, somewhat geographic in the upper lobes, although somewhat more nodular appearing in the lower lungs. Findings are nonspecific and infectious or inflammatory, differential considerations primarily include drug toxicity and atypical/viral infection. 3. Diffuse bilateral bronchial wall thickening, consistent with nonspecific infectious or inflammatory bronchitis. 4.  Redemonstrated findings of advanced metastatic lung malignancy, better assessed by recent prior staging examination. 5. Coronary artery disease. Electronically Signed   By: Eddie Candle M.D.   On: 02/27/2021 10:49   CT Thoracic Spine Wo Contrast  Result Date: 02/18/2021 CLINICAL DATA:  Metastases on CT abdomen EXAM: CT THORACIC AND LUMBAR SPINE WITHOUT CONTRAST TECHNIQUE: Multidetector CT imaging of the thoracic and lumbar spine was performed without contrast. Multiplanar CT image reconstructions were also generated. COMPARISON:  None. FINDINGS: CT THORACIC SPINE FINDINGS Alignment: Preserved. Vertebrae: There is a small partially sclerotic lesion at the  right aspect of the T5 vertebral body. A lytic lesion is present at the anterior aspect T9 likely with pathologic fracture but no substantial height loss. Ill-defined lucent lesions are present elsewhere. Paraspinal and other soft tissues: Better evaluated on prior dedicated imaging. Disc levels: Multilevel degenerative changes. No high-grade degenerative canal narrowing. CT LUMBAR SPINE FINDINGS Segmentation: 5 lumbar type vertebrae. Alignment: Dextrocurvature.  Grade 1 anterolisthesis at L4-L5 Vertebrae: Lucency and sclerosis at L2 with pathologic compression fracture resulting in less than 50% loss of height at the superior endplate. Sclerotic lesion of the right sacrum. Paraspinal and other soft tissues: Better evaluated on prior dedicated imaging. Disc levels: Multilevel degenerative changes. Canal stenosis is greatest at L3-L4 and L4-L5. Foraminal narrowing is greatest on the right at L5-S1. IMPRESSION: Sclerotic and lytic metastatic lesions as already identified on the CT chest, abdomen, and pelvis. Pathologic L2 fracture with less than 50% loss of height. Probable pathologic fracture at T9 without height loss. No apparent significant epidural disease by CT. Electronically Signed   By: Macy Mis M.D.   On: 02/18/2021 10:53   CT Lumbar Spine Wo  Contrast  Result Date: 02/18/2021 CLINICAL DATA:  Metastases on CT abdomen EXAM: CT THORACIC AND LUMBAR SPINE WITHOUT CONTRAST TECHNIQUE: Multidetector CT imaging of the thoracic and lumbar spine was performed without contrast. Multiplanar CT image reconstructions were also generated. COMPARISON:  None. FINDINGS: CT THORACIC SPINE FINDINGS Alignment: Preserved. Vertebrae: There is a small partially sclerotic lesion at the right aspect of the T5 vertebral body. A lytic lesion is present at the anterior aspect T9 likely with pathologic fracture but no substantial height loss. Ill-defined lucent lesions are present elsewhere. Paraspinal and other soft tissues: Better evaluated on prior dedicated imaging. Disc levels: Multilevel degenerative changes. No high-grade degenerative canal narrowing. CT LUMBAR SPINE FINDINGS Segmentation: 5 lumbar type vertebrae. Alignment: Dextrocurvature.  Grade 1 anterolisthesis at L4-L5 Vertebrae: Lucency and sclerosis at L2 with pathologic compression fracture resulting in less than 50% loss of height at the superior endplate. Sclerotic lesion of the right sacrum. Paraspinal and other soft tissues: Better evaluated on prior dedicated imaging. Disc levels: Multilevel degenerative changes. Canal stenosis is greatest at L3-L4 and L4-L5. Foraminal narrowing is greatest on the right at L5-S1. IMPRESSION: Sclerotic and lytic metastatic lesions as already identified on the CT chest, abdomen, and pelvis. Pathologic L2 fracture with less than 50% loss of height. Probable pathologic fracture at T9 without height loss. No apparent significant epidural disease by CT. Electronically Signed   By: Macy Mis M.D.   On: 02/18/2021 10:53   MR Brain W and Wo Contrast  Result Date: 02/15/2021 CLINICAL DATA:  Abnormal head CT.  Skull base mass. EXAM: MRI HEAD WITHOUT AND WITH CONTRAST TECHNIQUE: Multiplanar, multiecho pulse sequences of the brain and surrounding structures were obtained without and  with intravenous contrast. CONTRAST:  80mL GADAVIST GADOBUTROL 1 MMOL/ML IV SOLN COMPARISON:  CT head 02/15/2021 FINDINGS: Brain: Large destructive mass in the posterior skull base bilaterally left greater than right. This involves the occipital bone with extension into the left temporal bone. The occipital bone is significantly expanded due to tumor on the left with mass-effect on the left cerebellum. Soft tissue mass associated with left occipital lesion measures approximately 5.2 x 3.0 cm. Mild edema in the left cerebellum. There is infiltrating tumor in the left temporal bone which is more sizable than would be predicted from the CT. The soft tissue mass associated with the left occipital bone shows susceptibility which  may be due to mineralization and/or bone fragments from bone destruction. Ventricle size normal. No midline shift. Moderate white matter changes likely due to chronic microvascular ischemia. No acute infarct. Chronic microhemorrhage in the left parietal lobe. Subtle enhancing lesions in the brain in the right frontal lobe measuring approximately 10 mm, and 4 mm best seen on coronal and sagittal postcontrast imaging. This is suspicious for metastatic disease. Vascular: Normal arterial flow voids. Normal venous enhancement. No evidence of venous sinus thrombosis. Skull and upper cervical spine: Infiltrating destructive mass in the occipital bone bilaterally left greater than right with extension into the left temporal bone. Additional lesions in the frontal bone bilaterally and in the left parietal bone likely metastatic disease. Sinuses/Orbits: Mild mucosal edema paranasal sinuses. Negative orbit Other: None IMPRESSION: Destructive mass lesion in the occipital bone bilaterally left greater than right. There is associated soft tissue mass in the left occipital bone extending into the posterior fossa with mass-effect and mild edema of the left cerebellum. This mass extends into the left temporal  bone. Additional bone lesions are present in the frontal bones and left parietal bone. Subtle enhancing lesions right frontal lobe measuring 10 mm and 4 mm. Findings compatible with metastatic disease. Electronically Signed   By: Franchot Gallo M.D.   On: 02/15/2021 13:47   NM Bone Scan Whole Body  Result Date: 02/27/2021 CLINICAL DATA:  Metastatic disease evaluation, lung mass, abnormal CT EXAM: NUCLEAR MEDICINE WHOLE BODY BONE SCAN TECHNIQUE: Whole body anterior and posterior images were obtained approximately 3 hours after intravenous injection of radiopharmaceutical. RADIOPHARMACEUTICALS:  19.79 mCi Technetium-70m MDP IV COMPARISON:  None Correlation: CT chest abdomen pelvis 02/15/2021, CT thoracic and lumbar spine 02/18/2021, CT head 02/15/2021 FINDINGS: Multiple sites of abnormal tracer uptake are identified consistent with widespread osseous metastatic disease. These include calvaria greatest at occipital bone growth more on LEFT, thoracic and lumbar spine, RIGHT ribs, and pelvis. Dextroconvex thoracolumbar scoliosis. No definite abnormal tracer uptake within the humeri or femora. Expected urinary tract and soft tissue distribution of tracer. IMPRESSION: Scattered osseous metastases as above. Electronically Signed   By: Lavonia Dana M.D.   On: 02/27/2021 18:17   US Renal  Result Date: 02/18/2021 CLINICAL DATA:  Back pain. Right renal pelvis fullness on right upper quadrant ultrasound performed 1 day prior EXAM: RENAL / URINARY TRACT ULTRASOUND COMPLETE COMPARISON:  02/17/2021 abdominal sonogram. FINDINGS: Right Kidney: Renal measurements: 10.3 x 4.2 x 4.2 cm = volume: 95 mL. Normal parenchymal echogenicity and thickness. Mild right hydronephrosis. Possible 5 mm right ureteropelvic junction stone. Nonobstructing 3 mm lower right renal stone. No renal masses. Left Kidney: Renal measurements: 10.0 x 4.8 x 4.2 cm = volume: 107 mL. Normal parenchymal echogenicity and thickness. No left hydronephrosis. No  renal masses. Probable nonobstructing 7 mm upper left renal stone. Bladder: Appears normal for degree of bladder distention. Bilateral ureteral jets seen in the bladder. Other: None. IMPRESSION: 1. Mild right hydronephrosis. Possible 5 mm right UPJ stone. Nonobstructing 3 mm lower right renal stone. Suggest unenhanced CT abdomen/pelvis for further evaluation. 2. Probable nonobstructing upper left renal stone. Electronically Signed   By: Ilona Sorrel M.D.   On: 02/18/2021 12:02   CT CHEST ABDOMEN PELVIS W CONTRAST  Result Date: 02/15/2021 CLINICAL DATA:  Altered mental status. 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 EXAM: CT CHEST, ABDOMEN, AND PELVIS WITH CONTRAST TECHNIQUE: Multidetector CT imaging of the chest, abdomen and pelvis was performed following the standard protocol during bolus administration  of intravenous contrast. CONTRAST:  75 mL OMNIPAQUE IOHEXOL 300 MG/ML  SOLN COMPARISON:  None. FINDINGS: CT CHEST FINDINGS Cardiovascular: No significant vascular findings. Normal heart size. No pericardial effusion. Mediastinum/Nodes: The patient has extensive lymphadenopathy. A 2 cm left axillary node is seen on image 11 of series 2; a second left axillary node on image 14 measures 1.4 cm. A 1.1 cm subpectoral node is seen on the left on image 15 of series 2. A 1.4 cm left supraclavicular node is seen on image 9. Right paratracheal node on image 15 measures 1.9 cm. Right precarinal nodal mass on image 25 measures 2.6 cm and there is a right hilar nodal mass measuring 4.2 x 3.1 cm on image 30. The esophagus and thyroid are negative. Lungs/Pleura: Small right pleural effusion. A spiculated nodule in the right middle lobe measures 2.6 cm transverse by 2.7 cm AP on image 77, series 4. Scattered pulmonary nodules include a 0.5 cm nodule in the superior segment of the left lower lobe on image 80, 0.4 cm left upper lobe nodule on image 76 and 2 punctate nodules on image 83. Additional smaller  nodules are seen scattered through both lungs. The lungs are emphysematous. No pleural effusion. Musculoskeletal: A 0.9 cm lytic lesion is seen in T5, lytic lesions are seen in T7 and T9, larger in T9. There is also a small lytic lesion in T10. CT ABDOMEN PELVIS FINDINGS Hepatobiliary: Metastatic lesions are seen in the left hepatic lobe measuring 1.7 cm on image 57, series 2 and near the dome of the liver on image 50 of series 2 where a 1.1 cm lesion is identified. A second lesion near the dome of the liver in the right hepatic lobe on image 50 measures 0.7 cm. There is mild intra and extrahepatic biliary ductal dilatation likely related to prior cholecystectomy. Pancreas: Unremarkable. No pancreatic ductal dilatation or surrounding inflammatory changes. Spleen: Normal in size without focal abnormality. Adrenals/Urinary Tract: Adrenal glands are unremarkable. Kidneys are normal, without renal calculi, focal lesion, or hydronephrosis. Bladder is unremarkable. Stomach/Bowel: Stomach is within normal limits. Appendix appears normal. No evidence of bowel wall thickening, distention, or inflammatory changes. Moderately large colonic stool burden throughout noted. Vascular/Lymphatic: Aortic atherosclerosis. No aneurysm. No pathologic lymphadenopathy by CT size criteria. Reproductive: Status post hysterectomy. No adnexal masses. Other: None. Musculoskeletal: Mottled appearance of the L2 vertebral body is consistent with metastatic disease. There is a mild pathologic superior endplate compression fracture of L2 with vertebral body height loss centrally of approximately 30%. IMPRESSION: Findings consistent with extensive metastatic carcinoma likely emanating from a 2.7 cm right middle lobe pulmonary nodule. Metastases include extensive lymphadenopathy in the chest,, pulmonary nodule bone lesions and liver lesions. Mild pathologic fracture of L2 with vertebral body height loss centrally of up to approximately 30% is again  noted. Aortic Atherosclerosis (ICD10-I70.0) and Emphysema (ICD10-J43.9). Electronically Signed   By: Inge Rise M.D.   On: 02/15/2021 14:25   DG Chest Portable 1 View  Result Date: 03/07/2021 CLINICAL DATA:  Shortness of breath EXAM: PORTABLE CHEST 1 VIEW COMPARISON:  03/05/2021 FINDINGS: Patchy bilateral airspace disease throughout the lungs, slightly worsened since prior study. Nodular areas within the lungs, stable since prior study. Heart is normal size. No effusions or acute bony abnormality. IMPRESSION: Worsening patchy bilateral airspace disease, likely worsening pneumonia. Scattered nodules unchanged. Electronically Signed   By: Rolm Baptise M.D.   On: 03/07/2021 21:41   DG Chest Port 1 View  Result Date: 02/27/2021 CLINICAL DATA:  Worsening shortness of breath since this morning. Recent diagnosis of lung cancer. EXAM: PORTABLE CHEST 1 VIEW COMPARISON:  Radiograph earlier today.  CT earlier today. FINDINGS: Stable heart size and mediastinal contours. Right hilar prominence and thickening of the lower paratracheal stripe related to known adenopathy. Unchanged right mid lung pulmonary nodule. Mild patchy bilateral suprahilar opacities corresponding to ground-glass opacity on CT earlier today. No significant change in the interim. No pneumothorax or large pleural effusion. Retained excreted IV contrast in the right renal collecting system with right hydronephrosis, partially included in the upper abdomen. IMPRESSION: 1. Stable radiographic appearance of the chest from earlier today. Similar patchy suprahilar opacities corresponding to ground-glass opacity on CT. 2. Right mid lung pulmonary nodule and thoracic adenopathy. 3. Partially included in the upper abdomen is retained excreted contrast in the right renal collecting system with right hydronephrosis. Right hydronephrosis was seen on renal ultrasound 02/18/2021. Electronically Signed   By: Keith Rake M.D.   On: 02/27/2021 15:04   DG  Chest Portable 1 View  Result Date: 02/27/2021 CLINICAL DATA:  60 year old female with shortness of breath and cough. Metastatic lung cancer. EXAM: PORTABLE CHEST 1 VIEW COMPARISON:  02/15/2021 and prior studies FINDINGS: New hazy opacities within the central/upper lungs noted. RIGHT middle lobe mass and small scattered pulmonary nodules bilaterally are again identified. No pleural effusion or pneumothorax. No acute bony abnormalities are identified. IMPRESSION: New hazy opacities within the central/upper lungs which may represent edema or infection. Unchanged RIGHT middle lobe mass and bilateral pulmonary nodules compatible with known malignancy/metastases. Electronically Signed   By: Margarette Canada M.D.   On: 02/27/2021 08:49   [place] CORE BIOPSY (LYMPH NODES)  Result Date: 02/21/2021 INDICATION: Multiple enlarged lymph nodes, right middle lobe lung mass, liver lesions and bone lesions. EXAM: ULTRASOUND GUIDED CORE BIOPSY OF LEFT SUPRACLAVICULAR LYMPH NODE MEDICATIONS: None. ANESTHESIA/SEDATION: Versed 2.0 mg IV Moderate Sedation Time:  12 minutes. The patient was continuously monitored during the procedure by the interventional radiology nurse under my direct supervision. PROCEDURE: The procedure, risks, benefits, and alternatives were explained to the patient. Questions regarding the procedure were encouraged and answered. The patient understands and consents to the procedure. A time-out was performed prior to initiating the procedure. The left neck was prepped with chlorhexidine in a sterile fashion, and a sterile drape was applied covering the operative field. A sterile gown and sterile gloves were used for the procedure. Local anesthesia was provided with 1% Lidocaine. After localizing an enlarged left supraclavicular lymph node by ultrasound, multiple 18 gauge core biopsy samples were obtained. Core biopsy samples were submitted in formalin as well as on saline soaked Telfa gauze. Additional ultrasound was  performed. COMPLICATIONS: None immediate. FINDINGS: An enlarged left supraclavicular lymph node measures approximately 3.0 x 1.8 x 2.9 cm. Solid core biopsy samples were obtained. IMPRESSION: Ultrasound-guided core biopsy performed of an enlarged left supraclavicular lymph node measuring 3 cm in greatest dimensions. Electronically Signed   By: Aletta Edouard M.D.   On: 02/21/2021 15:31   CT Maxillofacial Wo Contrast  Result Date: 02/15/2021 CLINICAL DATA:  Headache with left-sided face/neck/head pain. On antibiotics for reported laryngitis with bilateral ear pain. EXAM: CT HEAD WITHOUT CONTRAST CT MAXILLOFACIAL WITHOUT CONTRAST TECHNIQUE: Multidetector CT imaging of the head and maxillofacial structures were performed using the standard protocol without intravenous contrast. Multiplanar CT image reconstructions of the maxillofacial structures were also generated. COMPARISON:  None. FINDINGS: CT HEAD FINDINGS Brain: The calvarial/skull base mass lesion is described below. Spur this  process exerts mass effect on the left cerebellum with suspected narrowing of the fourth ventricle. No evidence of hydrocephalus. No evidence of acute large vascular territory infarct. No acute hemorrhage. Mild to moderate scattered white matter hypodensities, which are nonspecific but most likely relate to chronic microvascular ischemic disease. No midline shift. Vascular: No hyperdense vessel identified. Calcific atherosclerosis. Skull/skull base: There is aggressive soft tissue mass lesion involving the left occipital calvarium measuring up to approximately 5.5 x 3.1 by 3.3 cm (transverse by AP by craniocaudal) with bony destruction and expansion and extension inferiorly to involve the left temporal bone and skull base. There is mass effect on the inferior left cerebellum and possible involvement of the subjacent dural venous sinuses. Small left mastoid effusion. CT MAXILLOFACIAL FINDINGS Osseous: No fracture or mandibular  dislocation. No destructive process in the face. Orbits: Negative. No traumatic or inflammatory finding. Sinuses: Small right maxillary sinus retention cyst. Otherwise, sinuses are clear. Soft tissues: Negative. 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 Resulting mass effect on the left cerebellum and possible involvement of the subjacent dural venous sinuses. 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 Osteomyelitis is a differential consideration. Recommend MRI brain with and without contrast to further evaluate. Findings and recommendations discussed with Ashok Cordia, PA via telephone at 11:20 a.m. Electronically Signed   By: Margaretha Sheffield MD   On: 02/15/2021 11:29   US Abdomen Limited RUQ (LIVER/GB)  Result Date: 02/17/2021 CLINICAL DATA:  Nausea and vomiting for 1 day. EXAM: ULTRASOUND ABDOMEN LIMITED RIGHT UPPER QUADRANT COMPARISON:  CT chest, abdomen and pelvis 02/15/2021. FINDINGS: Gallbladder: Removed. Common bile duct: Diameter: 0.8 cm. Liver: Three hypoechoic liver lesions measuring up to 1.8 cm are identified are identified and correlate with metastatic deposit seen on prior CT. Mild intrahepatic biliary ductal dilatation seen on CT is also noted. Portal vein is patent on color Doppler imaging with normal direction of blood flow towards the liver. Other: There is fullness of the right renal pelvis which is new since the patient's CT scan yesterday. IMPRESSION: Three hypoechoic liver lesions measuring up to 1.8 cm correlate with metastatic deposit seen on CT. Status post cholecystectomy. Mild intra and extrahepatic biliary ductal dilatation is likely related to cholecystectomy. No obstructing lesion is identified. Fullness of the right renal pelvis is new since yesterday's examination. This could be  incidental. Correlation with dedicated renal ultrasound could be used for further evaluation if indicated. Electronically Signed   By: Inge Rise M.D.   On: 02/17/2021 12:53     Assessment and plan  1. Goals of care, counseling/discussion   2. Brain metastases ([place])   3. Neoplasm related pain   4. Primary malignant neoplasm of lung metastatic to other site, unspecified laterality ([place])   5. Bone lesion   6. Hypokalemia   Cancer Staging Malignant neoplasm of lung ([place]) Staging form: Lung, AJCC 8th Edition - Clinical: Stage IVB (cT4, cN3, cM1c) - Signed by Earlie Server, MD on 03/03/2021  # Metastatic lung adenocarcinoma with bone, brain metastasis. Labs are reviewed and discussed with patient. I discussed again with both her and sister about the rational and potential side effects. She voices understanding and is willing to proceed with cycle 1 carboplatin and Alimta.  Carboplatin was dose reduced to AUC 4 given her performance status. Will titrate up if she tolerates.  I  will continue her on empiric antibiotics for possible obstructive pneumonia.   S/p Vitamin 12 injection x 1 continue Folic acid 1mg  daily.   Keep appointment for medi port placement.   # Hypokalemia, critical K+ level of 2.6.  IV KCL 26meq x 1.  Recommend her to start potassium chloride 40mg eq daily. Check Mag level.   # Brain and bone metastasis.  Continue Dexamethasone 4mg  BID. Keppa 500mg  BID.  She has established care with Radonc for brain radiation,  # Neoplasm related pain, continue current regimen.   Supportive care measures are necessary for patient well-being and will be provided as necessary. We spent sufficient time to discuss many aspect of care, questions were answered to patient's satisfaction.  Follow up in 1 week for Lab MD IVF.  Earlie Server, MD, PhD Hematology Oncology [facility] at [facility] Pager- [phone] 03/14/2021

## 2021-03-14 NOTE — Progress Notes (Signed)
  Oncology Nurse Navigator Documentation  Navigator Location: [facility]-Med Onc (03/14/21 1100)   )Navigator Encounter Type: Follow-up Appt;Treatment (03/14/21 1100)                   Treatment Initiated Date: 03/14/21 (03/14/21 1100) Patient Visit Type: MedOnc;RadOnc (03/14/21 1100) Treatment Phase: First Chemo Tx;CT SIM (03/14/21 1100) Barriers/Navigation Needs: No Barriers At This Time;No Needs (03/14/21 1100)   Interventions: None Required (03/14/21 1100)         met with patient prior to starting chemotherapy today. All questions answered during visit. Reassurance provided. Instructed pt to pick up oral potassium supplement sent into pharmacy to start taking. Informed pt that will follow up with her this afternoon after chemo as well. Nothing further needed at this time. Instructed pt to call with any further questions or needs. Pt verbalized understanding.             Time Spent with Patient: 60 (03/14/21 1100)

## 2021-03-15 ENCOUNTER — Emergency Department: Payer: Medicaid Other

## 2021-03-15 ENCOUNTER — Other Ambulatory Visit: Payer: Self-pay

## 2021-03-15 ENCOUNTER — Ambulatory Visit: Payer: Medicaid Other

## 2021-03-15 ENCOUNTER — Telehealth: Payer: Self-pay | Admitting: *Deleted

## 2021-03-15 ENCOUNTER — Inpatient Hospital Stay
Admission: EM | Admit: 2021-03-15 | Discharge: 2021-03-17 | DRG: 871 | Disposition: A | Discharge status: Home / Self Care | Payer: Medicaid Other | Attending: Internal Medicine | Admitting: Internal Medicine

## 2021-03-15 DIAGNOSIS — G928 Other toxic encephalopathy: Secondary | ICD-10-CM | POA: Diagnosis present

## 2021-03-15 DIAGNOSIS — R590 Localized enlarged lymph nodes: Secondary | ICD-10-CM | POA: Diagnosis present

## 2021-03-15 DIAGNOSIS — Z880 Allergy status to penicillin: Secondary | ICD-10-CM

## 2021-03-15 DIAGNOSIS — B182 Chronic viral hepatitis C: Secondary | ICD-10-CM | POA: Diagnosis present

## 2021-03-15 DIAGNOSIS — T402X1A Poisoning by other opioids, accidental (unintentional), initial encounter: Secondary | ICD-10-CM | POA: Diagnosis present

## 2021-03-15 DIAGNOSIS — D500 Iron deficiency anemia secondary to blood loss (chronic): Secondary | ICD-10-CM | POA: Diagnosis present

## 2021-03-15 DIAGNOSIS — G893 Neoplasm related pain (acute) (chronic): Secondary | ICD-10-CM | POA: Diagnosis present

## 2021-03-15 DIAGNOSIS — J189 Pneumonia, unspecified organism: Secondary | ICD-10-CM | POA: Diagnosis present

## 2021-03-15 DIAGNOSIS — Z7189 Other specified counseling: Secondary | ICD-10-CM | POA: Diagnosis not present

## 2021-03-15 DIAGNOSIS — C342 Malignant neoplasm of middle lobe, bronchus or lung: Secondary | ICD-10-CM | POA: Diagnosis not present

## 2021-03-15 DIAGNOSIS — I2609 Other pulmonary embolism with acute cor pulmonale: Secondary | ICD-10-CM | POA: Diagnosis not present

## 2021-03-15 DIAGNOSIS — E876 Hypokalemia: Secondary | ICD-10-CM | POA: Diagnosis present

## 2021-03-15 DIAGNOSIS — M5020 Other cervical disc displacement, unspecified cervical region: Secondary | ICD-10-CM | POA: Diagnosis present

## 2021-03-15 DIAGNOSIS — C3490 Malignant neoplasm of unspecified part of unspecified bronchus or lung: Secondary | ICD-10-CM | POA: Diagnosis present

## 2021-03-15 DIAGNOSIS — K219 Gastro-esophageal reflux disease without esophagitis: Secondary | ICD-10-CM | POA: Diagnosis present

## 2021-03-15 DIAGNOSIS — Z515 Encounter for palliative care: Secondary | ICD-10-CM | POA: Diagnosis not present

## 2021-03-15 DIAGNOSIS — N280 Ischemia and infarction of kidney: Secondary | ICD-10-CM | POA: Diagnosis not present

## 2021-03-15 DIAGNOSIS — I10 Essential (primary) hypertension: Secondary | ICD-10-CM | POA: Diagnosis present

## 2021-03-15 DIAGNOSIS — F419 Anxiety disorder, unspecified: Secondary | ICD-10-CM | POA: Diagnosis present

## 2021-03-15 DIAGNOSIS — Z66 Do not resuscitate: Secondary | ICD-10-CM | POA: Diagnosis not present

## 2021-03-15 DIAGNOSIS — D696 Thrombocytopenia, unspecified: Secondary | ICD-10-CM | POA: Diagnosis present

## 2021-03-15 DIAGNOSIS — Z7951 Long term (current) use of inhaled steroids: Secondary | ICD-10-CM

## 2021-03-15 DIAGNOSIS — T40601A Poisoning by unspecified narcotics, accidental (unintentional), initial encounter: Secondary | ICD-10-CM | POA: Diagnosis not present

## 2021-03-15 DIAGNOSIS — D6481 Anemia due to antineoplastic chemotherapy: Secondary | ICD-10-CM | POA: Diagnosis not present

## 2021-03-15 DIAGNOSIS — J9601 Acute respiratory failure with hypoxia: Secondary | ICD-10-CM | POA: Diagnosis present

## 2021-03-15 DIAGNOSIS — A419 Sepsis, unspecified organism: Secondary | ICD-10-CM | POA: Diagnosis present

## 2021-03-15 DIAGNOSIS — F17210 Nicotine dependence, cigarettes, uncomplicated: Secondary | ICD-10-CM | POA: Diagnosis present

## 2021-03-15 DIAGNOSIS — Z20822 Contact with and (suspected) exposure to covid-19: Secondary | ICD-10-CM | POA: Diagnosis present

## 2021-03-15 DIAGNOSIS — I2699 Other pulmonary embolism without acute cor pulmonale: Secondary | ICD-10-CM | POA: Diagnosis not present

## 2021-03-15 DIAGNOSIS — Z79899 Other long term (current) drug therapy: Secondary | ICD-10-CM

## 2021-03-15 DIAGNOSIS — F32A Depression, unspecified: Secondary | ICD-10-CM | POA: Diagnosis present

## 2021-03-15 DIAGNOSIS — R6520 Severe sepsis without septic shock: Secondary | ICD-10-CM | POA: Diagnosis not present

## 2021-03-15 DIAGNOSIS — C7931 Secondary malignant neoplasm of brain: Secondary | ICD-10-CM | POA: Diagnosis present

## 2021-03-15 DIAGNOSIS — C3491 Malignant neoplasm of unspecified part of right bronchus or lung: Secondary | ICD-10-CM | POA: Diagnosis not present

## 2021-03-15 DIAGNOSIS — Z801 Family history of malignant neoplasm of trachea, bronchus and lung: Secondary | ICD-10-CM

## 2021-03-15 DIAGNOSIS — K769 Liver disease, unspecified: Secondary | ICD-10-CM | POA: Diagnosis present

## 2021-03-15 DIAGNOSIS — C7951 Secondary malignant neoplasm of bone: Secondary | ICD-10-CM | POA: Diagnosis present

## 2021-03-15 DIAGNOSIS — K851 Biliary acute pancreatitis without necrosis or infection: Secondary | ICD-10-CM | POA: Diagnosis not present

## 2021-03-15 DIAGNOSIS — T451X5A Adverse effect of antineoplastic and immunosuppressive drugs, initial encounter: Secondary | ICD-10-CM | POA: Diagnosis present

## 2021-03-15 DIAGNOSIS — J440 Chronic obstructive pulmonary disease with acute lower respiratory infection: Secondary | ICD-10-CM | POA: Diagnosis present

## 2021-03-15 DIAGNOSIS — F329 Major depressive disorder, single episode, unspecified: Secondary | ICD-10-CM | POA: Diagnosis present

## 2021-03-15 LAB — MRSA PCR SCREENING: MRSA by PCR: NEGATIVE

## 2021-03-15 LAB — CBC WITH DIFFERENTIAL/PLATELET
Abs Immature Granulocytes: 0.11 10*3/uL — ABNORMAL HIGH (ref 0.00–0.07)
Basophils Absolute: 0 10*3/uL (ref 0.0–0.1)
Basophils Relative: 0 %
Eosinophils Absolute: 0 10*3/uL (ref 0.0–0.5)
Eosinophils Relative: 0 %
HCT: 25.9 % — ABNORMAL LOW (ref 36.0–46.0)
Hemoglobin: 8.5 g/dL — ABNORMAL LOW (ref 12.0–15.0)
Immature Granulocytes: 1 %
Lymphocytes Relative: 3 %
Lymphs Abs: 0.6 10*3/uL — ABNORMAL LOW (ref 0.7–4.0)
MCH: 27.8 pg (ref 26.0–34.0)
MCHC: 32.8 g/dL (ref 30.0–36.0)
MCV: 84.6 fL (ref 80.0–100.0)
Monocytes Absolute: 0.5 10*3/uL (ref 0.1–1.0)
Monocytes Relative: 3 %
Neutro Abs: 16.9 10*3/uL — ABNORMAL HIGH (ref 1.7–7.7)
Neutrophils Relative %: 93 %
Platelets: 97 10*3/uL — ABNORMAL LOW (ref 150–400)
RBC: 3.06 MIL/uL — ABNORMAL LOW (ref 3.87–5.11)
RDW: 15.1 % (ref 11.5–15.5)
WBC: 18.1 10*3/uL — ABNORMAL HIGH (ref 4.0–10.5)
nRBC: 0 % (ref 0.0–0.2)

## 2021-03-15 LAB — URINE DRUG SCREEN, QUALITATIVE (ARMC ONLY)
Amphetamines, Ur Screen: NOT DETECTED
Barbiturates, Ur Screen: NOT DETECTED
Benzodiazepine, Ur Scrn: NOT DETECTED
Cannabinoid 50 Ng, Ur ~~LOC~~: NOT DETECTED
Cocaine Metabolite,Ur ~~LOC~~: NOT DETECTED
MDMA (Ecstasy)Ur Screen: NOT DETECTED
Methadone Scn, Ur: NOT DETECTED
Opiate, Ur Screen: POSITIVE — AB
Phencyclidine (PCP) Ur S: NOT DETECTED
Tricyclic, Ur Screen: POSITIVE — AB

## 2021-03-15 LAB — BASIC METABOLIC PANEL
Anion gap: 11 (ref 5–15)
BUN: 30 mg/dL — ABNORMAL HIGH (ref 6–20)
CO2: 23 mmol/L (ref 22–32)
Calcium: 8.2 mg/dL — ABNORMAL LOW (ref 8.9–10.3)
Chloride: 106 mmol/L (ref 98–111)
Creatinine, Ser: 0.86 mg/dL (ref 0.44–1.00)
GFR, Estimated: >60 mL/min (ref >60)
Glucose, Bld: 137 mg/dL — ABNORMAL HIGH (ref 70–99)
Potassium: 3.6 mmol/L (ref 3.5–5.1)
Sodium: 140 mmol/L (ref 135–145)

## 2021-03-15 LAB — HEPATIC FUNCTION PANEL
ALT: 27 U/L (ref 0–44)
AST: 27 U/L (ref 15–41)
Albumin: 2.6 g/dL — ABNORMAL LOW (ref 3.5–5.0)
Alkaline Phosphatase: 278 U/L — ABNORMAL HIGH (ref 38–126)
Bilirubin, Direct: 0.1 mg/dL (ref 0.0–0.2)
Indirect Bilirubin: 0.3 mg/dL (ref 0.3–0.9)
Total Bilirubin: 0.4 mg/dL (ref 0.3–1.2)
Total Protein: 6.1 g/dL — ABNORMAL LOW (ref 6.5–8.1)

## 2021-03-15 LAB — RESP PANEL BY RT-PCR (FLU A&B, COVID) ARPGX2
Influenza A by PCR: NEGATIVE
Influenza B by PCR: NEGATIVE
SARS Coronavirus 2 by RT PCR: NEGATIVE

## 2021-03-15 LAB — LACTIC ACID, PLASMA
Lactic Acid, Venous: 2 mmol/L (ref 0.5–1.9)
Lactic Acid, Venous: 2.1 mmol/L (ref 0.5–1.9)
Lactic Acid, Venous: 1.5 mmol/L (ref 0.5–1.9)
Lactic Acid, Venous: 0.9 mmol/L (ref 0.5–1.9)

## 2021-03-15 LAB — URINALYSIS, COMPLETE (UACMP) WITH MICROSCOPIC
Bacteria, UA: NONE SEEN
Glucose, UA: NEGATIVE mg/dL
Ketones, ur: NEGATIVE mg/dL
Leukocytes,Ua: NEGATIVE
Nitrite: NEGATIVE
Protein, ur: NEGATIVE mg/dL
Specific Gravity, Urine: 1.02 (ref 1.005–1.030)
pH: 5 (ref 5.0–8.0)

## 2021-03-15 LAB — ETHANOL: Alcohol, Ethyl (B): <10 mg/dL (ref <10)

## 2021-03-15 LAB — BRAIN NATRIURETIC PEPTIDE: B Natriuretic Peptide: 202.3 pg/mL — ABNORMAL HIGH (ref 0.0–100.0)

## 2021-03-15 LAB — LIPASE, BLOOD: Lipase: 50 U/L (ref 11–51)

## 2021-03-15 LAB — TROPONIN I (HIGH SENSITIVITY)
Troponin I (High Sensitivity): 65 ng/L — ABNORMAL HIGH (ref <18)
Troponin I (High Sensitivity): 65 ng/L — ABNORMAL HIGH (ref <18)

## 2021-03-15 LAB — AMMONIA: Ammonia: 23 umol/L (ref 9–35)

## 2021-03-15 MED ORDER — MOMETASONE FURO-FORMOTEROL FUM 100-5 MCG/ACT IN AERO
2.0000 | INHALATION_SPRAY | Freq: Two times a day (BID) | RESPIRATORY_TRACT | Status: DC
  Administered 2021-03-16 (×2): 2 via RESPIRATORY_TRACT
  Filled 2021-03-15 (×2): qty 8.8

## 2021-03-15 MED ORDER — LACTATED RINGERS IV SOLN: INTRAVENOUS | Status: DC

## 2021-03-15 MED ORDER — ONDANSETRON HCL 4 MG/2ML IJ SOLN
4.0000 mg | Freq: Four times a day (QID) | INTRAMUSCULAR | Status: DC | PRN
  Administered 2021-03-17: 4 mg via INTRAVENOUS
  Filled 2021-03-15: qty 2

## 2021-03-15 MED ORDER — ACETAMINOPHEN 650 MG RE SUPP: 650.0000 mg | Freq: Four times a day (QID) | RECTAL | Status: DC | PRN

## 2021-03-15 MED ORDER — VANCOMYCIN HCL IN DEXTROSE 1-5 GM/200ML-% IV SOLN
1000.0000 mg | INTRAVENOUS | Status: DC
  Administered 2021-03-16: 1000 mg via INTRAVENOUS
  Filled 2021-03-15 (×2): qty 200

## 2021-03-15 MED ORDER — SODIUM CHLORIDE 0.9 % IV BOLUS
1000.0000 mL | Freq: Once | INTRAVENOUS | Status: AC
  Administered 2021-03-15: 1000 mL via INTRAVENOUS

## 2021-03-15 MED ORDER — SODIUM CHLORIDE 0.9 % IV SOLN: 2.0000 g | Freq: Once | INTRAVENOUS | Status: DC

## 2021-03-15 MED ORDER — VANCOMYCIN HCL IN DEXTROSE 1-5 GM/200ML-% IV SOLN: 1000.0000 mg | Freq: Once | INTRAVENOUS | Status: DC

## 2021-03-15 MED ORDER — IPRATROPIUM-ALBUTEROL 0.5-2.5 (3) MG/3ML IN SOLN
3.0000 mL | Freq: Four times a day (QID) | RESPIRATORY_TRACT | Status: DC | PRN
  Administered 2021-03-15: 3 mL via RESPIRATORY_TRACT
  Filled 2021-03-15: qty 3

## 2021-03-15 MED ORDER — ONDANSETRON HCL 4 MG PO TABS
4.0000 mg | ORAL_TABLET | Freq: Four times a day (QID) | ORAL | Status: DC | PRN
Start: 2021-03-15 — End: 2021-03-17

## 2021-03-15 MED ORDER — ACETAMINOPHEN 325 MG PO TABS: 650.0000 mg | ORAL_TABLET | Freq: Four times a day (QID) | ORAL | Status: DC | PRN

## 2021-03-15 MED ORDER — SODIUM CHLORIDE 0.9 % IV SOLN
2.0000 g | Freq: Once | INTRAVENOUS | Status: AC
  Administered 2021-03-15: 2 g via INTRAVENOUS
  Filled 2021-03-15: qty 2

## 2021-03-15 MED ORDER — PANTOPRAZOLE SODIUM 40 MG IV SOLR
40.0000 mg | INTRAVENOUS | Status: DC
  Administered 2021-03-15: 40 mg via INTRAVENOUS
  Filled 2021-03-15: qty 40

## 2021-03-15 MED ORDER — ORAL CARE MOUTH RINSE
15.0000 mL | Freq: Two times a day (BID) | OROMUCOSAL | Status: DC
  Administered 2021-03-15 – 2021-03-17 (×2): 15 mL via OROMUCOSAL

## 2021-03-15 MED ORDER — NALOXONE HCL 2 MG/2ML IJ SOSY
PREFILLED_SYRINGE | INTRAMUSCULAR | Status: AC
  Administered 2021-03-15: 1 mg via INTRAVENOUS
  Filled 2021-03-15: qty 2

## 2021-03-15 MED ORDER — GUAIFENESIN ER 600 MG PO TB12
1200.0000 mg | ORAL_TABLET | Freq: Two times a day (BID) | ORAL | Status: DC
  Administered 2021-03-16 – 2021-03-17 (×3): 1200 mg via ORAL
  Filled 2021-03-15 (×4): qty 2

## 2021-03-15 MED ORDER — MAGIC MOUTHWASH
5.0000 mL | Freq: Three times a day (TID) | ORAL | Status: DC | PRN
  Filled 2021-03-15: qty 10

## 2021-03-15 MED ORDER — SODIUM CHLORIDE 0.9 % IV SOLN
2.0000 g | Freq: Two times a day (BID) | INTRAVENOUS | Status: DC
  Administered 2021-03-16: 2 g via INTRAVENOUS
  Filled 2021-03-15 (×3): qty 2

## 2021-03-15 MED ORDER — CHLORHEXIDINE GLUCONATE CLOTH 2 % EX PADS
6.0000 | MEDICATED_PAD | Freq: Every day | CUTANEOUS | Status: DC
  Administered 2021-03-15: 6 via TOPICAL

## 2021-03-15 MED ORDER — POTASSIUM CHLORIDE CRYS ER 20 MEQ PO TBCR
40.0000 meq | EXTENDED_RELEASE_TABLET | Freq: Every day | ORAL | Status: DC
  Administered 2021-03-16 – 2021-03-17 (×2): 40 meq via ORAL
  Filled 2021-03-15 (×3): qty 2

## 2021-03-15 MED ORDER — DEXAMETHASONE 4 MG PO TABS
4.0000 mg | ORAL_TABLET | Freq: Two times a day (BID) | ORAL | Status: DC
  Administered 2021-03-16 – 2021-03-17 (×3): 4 mg via ORAL
  Filled 2021-03-15 (×7): qty 1

## 2021-03-15 MED ORDER — NICOTINE 14 MG/24HR TD PT24
14.0000 mg | MEDICATED_PATCH | Freq: Every day | TRANSDERMAL | Status: DC
  Administered 2021-03-16 – 2021-03-17 (×2): 14 mg via TRANSDERMAL
  Filled 2021-03-15 (×2): qty 1

## 2021-03-15 MED ORDER — NALOXONE HCL 2 MG/2ML IJ SOSY: 1.0000 mg | PREFILLED_SYRINGE | Freq: Once | INTRAMUSCULAR | Status: AC

## 2021-03-15 MED ORDER — MAGNESIUM SULFATE 2 GM/50ML IV SOLN
2.0000 g | Freq: Once | INTRAVENOUS | Status: AC
  Administered 2021-03-15: 2 g via INTRAVENOUS
  Filled 2021-03-15: qty 50

## 2021-03-15 MED ORDER — VANCOMYCIN HCL IN DEXTROSE 1-5 GM/200ML-% IV SOLN
1000.0000 mg | Freq: Once | INTRAVENOUS | Status: AC
  Administered 2021-03-15: 1000 mg via INTRAVENOUS
  Filled 2021-03-15: qty 200

## 2021-03-15 MED ORDER — NALOXONE HCL 4 MG/10ML IJ SOLN
1.0000 mg/h | INTRAVENOUS | Status: DC
  Administered 2021-03-15: 1 mg/h via INTRAVENOUS
  Filled 2021-03-15: qty 10

## 2021-03-15 NOTE — Consult Note (Signed)
PHARMACY -  BRIEF ANTIBIOTIC NOTE   Pharmacy has received consult(s) for Vancomycin & Aztreonam>>Cefepime from an ED provider.  The patient's profile has been reviewed for ht/wt/allergies/indication/available labs.    One time order(s) placed for:  - Vancomycin 1g x1 (~20mg /kg)  - Aztreonam substituted with Cefepime 2g x1 (Re PCN allergy: pt has h/o tolerating Keflex and multiple cefepime doses in the past.)  Further antibiotics/pharmacy consults should be ordered by admitting physician if indicated.                       Thank you, Lorna Dibble 03/15/2021  1:11 PM

## 2021-03-15 NOTE — Progress Notes (Addendum)
Patient took breathing treatment off of her face after approx 5 min.  Patient finished treatment after RN came in and spoke with her

## 2021-03-15 NOTE — ED Provider Notes (Signed)
[facility] Emergency Department Provider Note ____________________________________________   Event Date/Time   First MD Initiated Contact with Patient 03/15/21 1056     (approximate)  I have reviewed the triage vital signs and the nursing notes.   HISTORY  Chief Complaint Shortness of Breath and Altered Mental Status  Level 5 caveat: History of present illness limited due to altered mental status  HPI Brenda Middleton is a 60 y.o. female with PMH as noted below including a recent diagnosis of metastatic lung cancer status post her first chemotherapy treatment yesterday who presents with altered mental status and respiratory distress, acute onset this morning.  The patient was found by her sister.  Per EMS, the O2 saturation was in the 80s on room air.  The patient was minimally responsive.  She is unable to give any history.  Past Medical History:  Diagnosis Date  . Anxiety   . Benign neoplasm of cervix uteri   . Chronic hepatitis C without mention of hepatic coma   . Chronic low back pain 08/26/2014  . Constipation   . Depressive disorder   . Displacement of cervical intervertebral disc   . Hypertensive disorder   . Iron deficiency anemia due to chronic blood loss 09/12/2019  . Palpitations   . Prolapsed cervical intervertebral disc     Patient Active Problem List   Diagnosis Date Noted  . Acute respiratory failure with hypoxia ([place]) 03/07/2021  . Bilateral pneumonia, recurrent 02/27/2021  . HTN (hypertension) 02/27/2021  . Depression with anxiety 02/27/2021  . Normocytic anemia 02/27/2021  . Sepsis ([place]) 02/27/2021  . Protein-calorie malnutrition, moderate ([place]) 02/27/2021  . Malignant neoplasm of lung ([place]) 02/27/2021  . Elevated troponin 02/27/2021  . Acute metabolic encephalopathy 26/33/3545  . Brain metastases ([place]) 02/27/2021  . Shortness of breath   . Metastatic malignant neoplasm ([place])   . Intractable low back pain 02/18/2021  .  Anxiety   . Depressive disorder   . Pathologic fracture of lumbar vertebra, initial encounter   . 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 COPD exacerbation ([place])   . GERD (gastroesophageal reflux disease)   . Palliative care encounter   . Pathologic compression fracture of lumbar vertebra ([place])   . Goals of care, counseling/discussion   . Neoplasm related pain   . Iron deficiency anemia due to chronic blood loss 09/12/2019  . Family history of cancer 09/12/2019  . Blood in the stool 09/12/2019  . Elevated alkaline phosphatase level 09/12/2019  . Chronic low back pain 08/26/2014    Past Surgical History:  Procedure Laterality Date  . CONIZATION CERVIX    . DILATION AND CURETTAGE OF UTERUS    . HAND SURGERY  2008,2015   Carpel Tunnel  . TONSILLECTOMY    . VULVECTOMY      Prior to Admission medications   Medication Sig Start Date End Date Taking? Authorizing Provider  albuterol (VENTOLIN HFA) 108 (90 Base) MCG/ACT inhaler Inhale 2 puffs into the lungs every 4 (four) hours as needed for wheezing or shortness of breath. 02/21/21   Danford, Suann Larry, MD  budesonide-formoterol (SYMBICORT) 80-4.5 MCG/ACT inhaler Inhale 2 puffs into the lungs 2 (two) times daily.    [provider]  busPIRone (BUSPAR) 10 MG tablet Take 1 tablet (10 mg total) by mouth 2 (two) times daily. 03/07/21   Earlie Server, MD  dexamethasone (DECADRON) 4 MG tablet Take 1 tablet (4 mg total) by mouth 2 (two) times daily. 03/02/21  Oswald Hillock, MD  fentaNYL (DURAGESIC) 50 MCG/HR Place 1 patch onto the skin every 3 (three) days. 03/07/21   Earlie Server, MD  folic acid (FOLVITE) 1 MG tablet Take 1 tablet (1 mg total) by mouth daily. Start 5-7 days before Alimta chemotherapy. Continue until 21 days after Alimta completed. Patient not taking: No sig reported 03/04/21   Earlie Server, MD  furosemide (LASIX) 20 MG tablet Take 20 mg by mouth 2 (two) times daily. Patient not taking: Reported on 03/14/2021 03/05/21   [provider]  gabapentin (NEURONTIN) 300 MG capsule Take 300 mg by mouth daily. 02/04/21   [provider]  guaiFENesin (MUCINEX) 600 MG 12 hr tablet Take 2 tablets (1,200 mg total) by mouth 2 (two) times daily. 03/02/21   Oswald Hillock, MD  hydrOXYzine (ATARAX/VISTARIL) 25 MG tablet hydroxyzine HCl 25 mg tablet    [provider]  ipratropium-albuterol (DUONEB) 0.5-2.5 (3) MG/3ML SOLN Take 3 mLs by nebulization every 6 (six) hours as needed. 03/08/21   Borders, Kirt Boys, NP  levETIRAcetam (KEPPRA) 500 MG tablet Take 1 tablet (500 mg total) by mouth 2 (two) times daily. Patient not taking: Reported on 03/14/2021 03/02/21   Oswald Hillock, MD  levofloxacin (LEVAQUIN) 750 MG tablet Take 1 tablet (750 mg total) by mouth daily. Patient not taking: Reported on 03/14/2021 03/09/21   Earlie Server, MD  lidocaine-prilocaine (EMLA) cream Apply to affected area once 03/04/21   Earlie Server, MD  magic mouthwash SOLN Take 5 mLs by mouth 3 (three) times daily as needed for mouth pain. 03/09/21   Earlie Server, MD  metoprolol tartrate (LOPRESSOR) 25 MG tablet Take 0.5 tablets (12.5 mg total) by mouth 2 (two) times daily. 03/02/21 06/30/21  Oswald Hillock, MD  oxyCODONE-acetaminophen (PERCOCET/ROXICET) 5-325 MG tablet Take 1-2 tablets by mouth every 4 (four) hours as needed for severe pain. 03/07/21   Earlie Server, MD  pantoprazole (PROTONIX) 40 MG tablet Take 40 mg by mouth daily. 02/08/21   [provider]  potassium chloride SA (KLOR-CON) 20 MEQ tablet Take 2 tablets (40 mEq total) by mouth daily. 03/14/21   Earlie Server, MD  prochlorperazine (COMPAZINE) 10 MG tablet Take 1 tablet (10 mg total) by mouth every 6 (six) hours as needed (Nausea or vomiting). Patient not taking: No sig reported 03/04/21   Earlie Server, MD  promethazine (PHENERGAN) 12.5 MG tablet Take 1 tablet (12.5 mg total) by mouth every 6 (six) hours as needed for nausea or vomiting. 03/07/21   Earlie Server, MD  QUEtiapine (SEROQUEL) 400 MG tablet Take 400 mg by mouth at  bedtime.    [provider]  amitriptyline (ELAVIL) 25 MG tablet Take 2 tablets (50 mg total) by mouth at bedtime. Patient not taking: No sig reported 08/26/14 01/01/21  Kathrynn Ducking, MD  clonazePAM (KLONOPIN) 0.5 MG tablet Take 0.5 mg by mouth 2 (two) times daily.  01/01/21  [provider]  temazepam (RESTORIL) 30 MG capsule Take 30 mg by mouth at bedtime.  01/01/21  [provider]    Allergies Penicillin g, Penicillin v, and Penicillins  Family History  Problem Relation Age of Onset  . Breast cancer Mother 50  . Lung cancer Father   . Cancer Brother   . Cancer Other   . Stroke Other     Social History Social History   Tobacco Use  . Smoking status: Former Smoker    Packs/day: 0.70    Years: 35.00  Pack years: 24.50    Types: Cigarettes  . Smokeless tobacco: Never Used  Vaping Use  . Vaping Use: Never used  Substance Use Topics  . Alcohol use: Yes    Comment: occasional wine  . Drug use: No    Review of Systems Level 5 caveat: Unable to obtain review of systems due to altered mental status    ____________________________________________   PHYSICAL EXAM:  VITAL SIGNS: ED Triage Vitals  Enc Vitals Group     BP 03/15/21 1119 (!) 116/97     Pulse Rate 03/15/21 1119 (!) 122     Resp 03/15/21 1119 (!) 9     Temp --      Temp src --      SpO2 03/15/21 1118 92 %     Weight 03/15/21 1119 111 lb 12.4 oz (50.7 kg)     Height 03/15/21 1119 5\' 6"  (1.676 m)     Head Circumference --      Peak Flow --      Pain Score 03/15/21 1119 0     Pain Loc --      Pain Edu? --      Excl. in [place]? --     Constitutional: Somnolent, minimally arousable to painful stimuli.  Agonal respirations. Eyes: Conjunctivae are normal.  Head: Atraumatic. Nose: No congestion/rhinnorhea. Mouth/Throat: Mucous membranes are dry. Neck: Normal range of motion.  Cardiovascular: Normal rate, regular rhythm. Grossly normal heart sounds.  Good peripheral  circulation. Respiratory: Agonal respirations.  Diffuse rales bilaterally. Gastrointestinal: Soft and nontender. No distention.  Genitourinary: No flank tenderness. Musculoskeletal: No lower extremity edema.  Extremities warm and well perfused.  Neurologic: Motor intact in all extremities.  Minimally responsive to painful stimuli. Skin:  Skin is warm and dry. No rash noted. Psychiatric: Unable to assess.  ____________________________________________   LABS (all labs ordered are listed, but only abnormal results are displayed)  Labs Reviewed  BASIC METABOLIC PANEL - Abnormal; Notable for the following components:      Result Value   Glucose, Bld 137 (*)    BUN 30 (*)    Calcium 8.2 (*)    All other components within normal limits  HEPATIC FUNCTION PANEL - Abnormal; Notable for the following components:   Total Protein 6.1 (*)    Albumin 2.6 (*)    Alkaline Phosphatase 278 (*)    All other components within normal limits  BRAIN NATRIURETIC PEPTIDE - Abnormal; Notable for the following components:   B Natriuretic Peptide 202.3 (*)    All other components within normal limits  LACTIC ACID, PLASMA - Abnormal; Notable for the following components:   Lactic Acid, Venous 2.0 (*)    All other components within normal limits  CBC WITH DIFFERENTIAL/PLATELET - Abnormal; Notable for the following components:   WBC 18.1 (*)    RBC 3.06 (*)    Hemoglobin 8.5 (*)    HCT 25.9 (*)    Platelets 97 (*)    Neutro Abs 16.9 (*)    Lymphs Abs 0.6 (*)    Abs Immature Granulocytes 0.11 (*)    All other components within normal limits  URINALYSIS, COMPLETE (UACMP) WITH MICROSCOPIC - Abnormal; Notable for the following components:   Color, Urine YELLOW (*)    APPearance HAZY (*)    Hgb urine dipstick MODERATE (*)    Bilirubin Urine MODERATE (*)    All other components within normal limits  LACTIC ACID, PLASMA - Abnormal; Notable for the following components:  Lactic Acid, Venous 2.1 (*)    All  other components within normal limits  TROPONIN I (HIGH SENSITIVITY) - Abnormal; Notable for the following components:   Troponin I (High Sensitivity) 65 (*)    All other components within normal limits  CULTURE, BLOOD (ROUTINE X 2)  CULTURE, BLOOD (ROUTINE X 2)  RESP PANEL BY RT-PCR (FLU A&B, COVID) ARPGX2  ETHANOL  LIPASE, BLOOD  AMMONIA  URINE DRUG SCREEN, QUALITATIVE ([facility] ONLY)  BLOOD GAS, ARTERIAL  TROPONIN I (HIGH SENSITIVITY)   ____________________________________________  EKG  ED ECG REPORT I, Arta Silence, the attending physician, personally viewed and interpreted this ECG.  Date: 03/15/2021 EKG Time: 1136 Rate: 97 Rhythm: normal sinus rhythm QRS Axis: normal Intervals: normal ST/T Wave abnormalities: normal Narrative Interpretation: no evidence of acute ischemia  ____________________________________________  RADIOLOGY  Chest x-ray interpreted by me shows bilateral diffuse airspace opacity improved from x-ray of 4/4  ____________________________________________   PROCEDURES  Procedure(s) performed: No  Procedures  Critical Care performed: Yes  CRITICAL CARE Performed by: Arta Silence   Total critical care time: 45 minutes  Critical care time was exclusive of separately billable procedures and treating other patients.  Critical care was necessary to treat or prevent imminent or life-threatening deterioration.  Critical care was time spent personally by me on the following activities: development of treatment plan with patient and/or surrogate as well as nursing, discussions with consultants, evaluation of patient's response to treatment, examination of patient, obtaining history from patient or surrogate, ordering and performing treatments and interventions, ordering and review of laboratory studies, ordering and review of radiographic studies, pulse oximetry and re-evaluation of patient's  condition. ____________________________________________   INITIAL IMPRESSION / ASSESSMENT AND PLAN / ED COURSE  Pertinent labs & imaging results that were available during my care of the patient were reviewed by me and considered in my medical decision making (see chart for details).  60 year old female with PMH as noted above including a recent diagnosis of metastatic lung cancer presents with altered mental status and shortness of breath after receiving her first chemotherapy treatment yesterday.  I reviewed the past medical records in [place].  The patient was seen at the cancer center yesterday.  She has bone and brain metastases.  She also has hypokalemia.  She is on oxycodone and fentanyl for pain.  On initial exam, the patient was somnolent, weak appearing, minimally responsive to painful stimuli, with agonal respirations.  O2 saturation was in the mid 90s on 2 L by nasal cannula.  Exam revealed diffuse rales bilaterally.  There is no peripheral edema.  Based on the initial exam is concerned that the patient would need intubation for airway protection and due to respiratory failure.  The notes from the patient's last admission earlier this month indicate that she was full code at that time.  I was then able to speak to the sister who confirmed that the patient had never signed DNR paperwork although based on her discussions with the patient about the patient's wishes, the patient expressed that she likely would not want to be put on a ventilator or have CPR done.  On further history I was able to ascertain that the patient was on fentanyl and oxycodone.  The fentanyl patch was removed.  We gave a dose of Narcan and the patient immediately became much more alert, was able to speak, and her respiratory status returned to normal.  Overall I suspect the primary etiology of the altered mental status and respiratory distress today  is accidental opiate overdose.  Differential includes worsening lung  mass, effusion, pneumonia, COVID-19 or other infectious cause, or less likely primary CNS or metabolic cause.  We will continue to give Narcan as needed and obtain chest x-ray and lab work-up to evaluate for other etiologies.  ----------------------------------------- 1:17 PM on 03/15/2021 -----------------------------------------  The patient now appears slightly more somnolent again although is maintaining her airway and adequate spontaneous respirations with a O2 saturation in the mid 90s.  I ordered a Narcan infusion which we are waiting for from the pharmacy.  The patient has an elevated lactate and WBC count concerning for acute infection/sepsis.  Most likely etiology would be respiratory.  Chest x-ray shows bilateral opacities which are somewhat improved from prior but could still reflect pneumonia.  I have ordered antibiotics for HCAP.  I consulted Dr. Francine Graven from the hospitalist service for admission.   ____________________________________________   FINAL CLINICAL IMPRESSION(S) / ED DIAGNOSES  Final diagnoses:  Opiate overdose, accidental or unintentional, initial encounter ([place])  Acute respiratory failure with hypoxia ([place])      NEW MEDICATIONS STARTED DURING THIS VISIT:  New Prescriptions   No medications on file     Note:  This document was prepared using Dragon voice recognition software and may include unintentional dictation errors.    Arta Silence, MD 03/15/21 1318

## 2021-03-15 NOTE — Progress Notes (Signed)
Patient admitted to ICU. Patient states she is having a hard time breathing, no complaints of pain. Respitory attempted to get ABG- stuck twice, patient refusing to get stuck again by another Respitory Therapist. Breathing treatment given. [facility]. Continue to assess.

## 2021-03-15 NOTE — Telephone Encounter (Signed)
Received call back from pt's sister, [name], a few minutes later to inform us that she called 911 so pt can be transported to the ED. Reassurance provided and informed that will inform Dr. Tasia Catchings. Pt's sister verbalized understanding.

## 2021-03-15 NOTE — Telephone Encounter (Signed)
Received message from radiation dept that pt is cancelling her appt today for simulation due to not feeling well. Phone call made to pt's sister to ask if pt wants to be seen by provider today to address her symptoms after receiving chemo yesterday. Pt's sister stated that she believes pt needs evaluation at the ED because pt is having labored breathing that sounds like she is gurgling. Pt is also experiences episodes of confusion.   Advised pt's sister that pt will need evaluation today by a provider to address her symptoms either at the cancer center or the ED. Informed that upon evaluation at the [facility], pt may need to be transported to the ED depending on her condition as well. Pt's sister is very concerned and stated that she will discuss with the patient and will call me back to let me know what they decide.   Dr. Tasia Catchings has been made aware.

## 2021-03-15 NOTE — Progress Notes (Signed)
Patient intermittently confused. Refused bi-pap multiple times. Hospitalist spoke with patient and patient agreed to wear BiPAP for a "few hours". Patient has no complaints of pain. Remains NPO. Narcan drip discontinued. Continue to assess.

## 2021-03-15 NOTE — ED Triage Notes (Signed)
Pt arrived via [facility] from home with c/o SOB. Per EMS, pt dx with lung cancer a month ago and had her first infusion treatment yesterday.   Per EMS, pt sister stated that pt was doing well yesterday and now today was feeling SOB, with O2 sat in the 80s on room air and was altered. EMS placed pt on 2L [place], O2 sat in the 90s at this time.   Pt altered at this time, unable to answer this RN's questions and has irregular breathing pattern noted. MD Siadecki notified and brought to bedside by this RN.   RT called at this time.

## 2021-03-15 NOTE — H&P (Addendum)
History and Physical    Brenda Middleton XLK:[id number] DOB: [birth year] DOA: 03/15/2021  PCP: Remi Haggard, FNP   Patient coming from: Home  I have personally briefly reviewed patient's old medical records in [place]  Chief Complaint: Change in mental status Most of the history was obtained from patient's sister at the bedside  HPI: Brenda Middleton is a 60 y.o. female with medical history significant for anxiety, hypertension, depression, nicotine dependence who presents to the emergency room via EMS for evaluation of change in mental status. Patient received her first dose of chemotherapy for metastatic adenocarcinoma of the lung on 03/15/21.  Her sister found her unresponsive in the morning of her admission and called EMS. Patient initially declined transport to the emergency room but when her condition continued to deteriorate her sister called EMS again and she was transported to the ER. Per EMS her room air pulse oximetry was in the 80s and upon arrival to the ER she was placed on 2 L of oxygen via nasal cannula.  She received a dose of Narcan in the ER and her fentanyl patch was removed with transient improvement in her mental status.  During my evaluation she is lethargic but arouses to verbal stimuli. I am unable to do a review of systems on her due to her mental status. Labs show sodium 140, potassium 3.6, chloride 106, bicarb 23, glucose 137, BUN 30, creatinine 0.86, alkaline phosphatase 278, albumin 2.6, lipase 50, AST 27, ALT 27, total protein 6.1, ammonia 23, BNP 202, troponin 65, lactic acid 2.0 >> 2.1, white count 18.1, hemoglobin 8.5, hematocrit 25.9, MCV 84.6, RDW 15.1, platelet count 97 Respiratory viral panel is negative Urine analysis is sterile Chest x-ray reviewed by me shows airspace opacities bilateral Twelve-lead EKG reviewed by me shows sinus with T wave abnormalities in the lateral leads    ED Course: Patient is a 60 year old female recently  diagnosed with metastatic adenocarcinoma of the lung who presents via EMS for evaluation of mental status changes and respiratory distress.  She was hypoxic in the field with a pulse oximetry of 80% and required oxygen supplementation at 2 L to maintain pulse oximetry greater than 92%. She received a dose of Narcan and her fentanyl patch was removed with transient improvement in her mental status.  She is currently on a Narcan drip. Chest x-ray shows bilateral opacities, patient has leukocytosis with a left shift and elevated lactic acid level.  She received broad-spectrum antibiotics and IV fluids in the ER and will be admitted to the hospital for further evaluation.    Review of Systems: As per HPI otherwise all other systems reviewed and negative.    Past Medical History:  Diagnosis Date  . Anxiety   . Benign neoplasm of cervix uteri   . Chronic hepatitis C without mention of hepatic coma   . Chronic low back pain 08/26/2014  . Constipation   . Depressive disorder   . Displacement of cervical intervertebral disc   . Hypertensive disorder   . Iron deficiency anemia due to chronic blood loss 09/12/2019  . Palpitations   . Prolapsed cervical intervertebral disc     Past Surgical History:  Procedure Laterality Date  . CONIZATION CERVIX    . DILATION AND CURETTAGE OF UTERUS    . HAND SURGERY  2008,2015   Carpel Tunnel  . TONSILLECTOMY    . VULVECTOMY       reports that she has quit smoking. Her smoking use included  cigarettes. She has a 24.50 pack-year smoking history. She has never used smokeless tobacco. She reports current alcohol use. She reports that she does not use drugs.  Allergies  Allergen Reactions  . Penicillin G Hives    Other reaction(s): HIVES Other reaction(s): HIVES  . Penicillin V Itching  . Penicillins     Family History  Problem Relation Age of Onset  . Breast cancer Mother 51  . Lung cancer Father   . Cancer Brother   . Cancer Other   . Stroke Other        Prior to Admission medications   Medication Sig Start Date End Date Taking? Authorizing Provider  albuterol (VENTOLIN HFA) 108 (90 Base) MCG/ACT inhaler Inhale 2 puffs into the lungs every 4 (four) hours as needed for wheezing or shortness of breath. 02/21/21   Danford, Suann Larry, MD  budesonide-formoterol (SYMBICORT) 80-4.5 MCG/ACT inhaler Inhale 2 puffs into the lungs 2 (two) times daily.    [provider]  busPIRone (BUSPAR) 10 MG tablet Take 1 tablet (10 mg total) by mouth 2 (two) times daily. 03/07/21   Earlie Server, MD  dexamethasone (DECADRON) 4 MG tablet Take 1 tablet (4 mg total) by mouth 2 (two) times daily. 03/02/21   Oswald Hillock, MD  fentaNYL (DURAGESIC) 50 MCG/HR Place 1 patch onto the skin every 3 (three) days. 03/07/21   Earlie Server, MD  folic acid (FOLVITE) 1 MG tablet Take 1 tablet (1 mg total) by mouth daily. Start 5-7 days before Alimta chemotherapy. Continue until 21 days after Alimta completed. Patient not taking: No sig reported 03/04/21   Earlie Server, MD  furosemide (LASIX) 20 MG tablet Take 20 mg by mouth 2 (two) times daily. Patient not taking: Reported on 03/14/2021 03/05/21   [provider]  gabapentin (NEURONTIN) 300 MG capsule Take 300 mg by mouth daily. 02/04/21   [provider]  guaiFENesin (MUCINEX) 600 MG 12 hr tablet Take 2 tablets (1,200 mg total) by mouth 2 (two) times daily. 03/02/21   Oswald Hillock, MD  hydrOXYzine (ATARAX/VISTARIL) 25 MG tablet hydroxyzine HCl 25 mg tablet    [provider]  ipratropium-albuterol (DUONEB) 0.5-2.5 (3) MG/3ML SOLN Take 3 mLs by nebulization every 6 (six) hours as needed. 03/08/21   Borders, Kirt Boys, NP  levETIRAcetam (KEPPRA) 500 MG tablet Take 1 tablet (500 mg total) by mouth 2 (two) times daily. Patient not taking: Reported on 03/14/2021 03/02/21   Oswald Hillock, MD  levofloxacin (LEVAQUIN) 750 MG tablet Take 1 tablet (750 mg total) by mouth daily. Patient not taking: Reported on 03/14/2021 03/09/21    Earlie Server, MD  lidocaine-prilocaine (EMLA) cream Apply to affected area once 03/04/21   Earlie Server, MD  magic mouthwash SOLN Take 5 mLs by mouth 3 (three) times daily as needed for mouth pain. 03/09/21   Earlie Server, MD  metoprolol tartrate (LOPRESSOR) 25 MG tablet Take 0.5 tablets (12.5 mg total) by mouth 2 (two) times daily. 03/02/21 06/30/21  Oswald Hillock, MD  oxyCODONE-acetaminophen (PERCOCET/ROXICET) 5-325 MG tablet Take 1-2 tablets by mouth every 4 (four) hours as needed for severe pain. 03/07/21   Earlie Server, MD  pantoprazole (PROTONIX) 40 MG tablet Take 40 mg by mouth daily. 02/08/21   [provider]  potassium chloride SA (KLOR-CON) 20 MEQ tablet Take 2 tablets (40 mEq total) by mouth daily. 03/14/21   Earlie Server, MD  prochlorperazine (COMPAZINE) 10 MG tablet Take 1 tablet (10 mg total) by mouth  every 6 (six) hours as needed (Nausea or vomiting). Patient not taking: No sig reported 03/04/21   Earlie Server, MD  promethazine (PHENERGAN) 12.5 MG tablet Take 1 tablet (12.5 mg total) by mouth every 6 (six) hours as needed for nausea or vomiting. 03/07/21   Earlie Server, MD  QUEtiapine (SEROQUEL) 400 MG tablet Take 400 mg by mouth at bedtime.    [provider]  amitriptyline (ELAVIL) 25 MG tablet Take 2 tablets (50 mg total) by mouth at bedtime. Patient not taking: No sig reported 08/26/14 01/01/21  Kathrynn Ducking, MD  clonazePAM (KLONOPIN) 0.5 MG tablet Take 0.5 mg by mouth 2 (two) times daily.  01/01/21  [provider]  temazepam (RESTORIL) 30 MG capsule Take 30 mg by mouth at bedtime.  01/01/21  [provider]    Physical Exam: Vitals:   03/15/21 1200 03/15/21 1230 03/15/21 1300 03/15/21 1330  BP: 108/60 92/60 119/82 132/85  Pulse: 95 94 95 99  Resp: 10 10 11 12   SpO2: 93% 94% 92% 90%  Weight:      Height:         Vitals:   03/15/21 1200 03/15/21 1230 03/15/21 1300 03/15/21 1330  BP: 108/60 92/60 119/82 132/85  Pulse: 95 94 95 99  Resp: 10 10 11 12   SpO2: 93% 94% 92%  90%  Weight:      Height:          Constitutional:  Lethargic but arouses to loud verbal stimuli HEENT:      Head: Normocephalic and atraumatic.         Eyes: PERLA, EOMI, Conjunctivae are normal. Sclera is non-icteric.       Mouth/Throat: Mucous membranes are dry      Neck: Supple with no signs of meningismus. Cardiovascular:  Tachycardic. No murmurs, gallops, or rubs. 2+ symmetrical distal pulses are present . No JVD. No LE edema Respiratory:  Tachypneic.diffuse rhonchi . No wheezes Gastrointestinal: Soft, non tender, and non distended with positive bowel sounds.  Genitourinary: No CVA tenderness. Musculoskeletal: Nontender with normal range of motion in all extremities. No cyanosis, or erythema of extremities. Neurologic:  Face is symmetric. Moving all extremities. No gross focal neurologic deficits  Skin: Skin is warm, dry.  No rash or ulcers Psychiatric: Unable to assess  Labs on Admission: I have personally reviewed following labs and imaging studies  CBC: Recent Labs  Lab 03/14/21 0817 03/15/21 1109  WBC 27.6* 18.1*  NEUTROABS 24.3* 16.9*  HGB 9.7* 8.5*  HCT 30.3* 25.9*  MCV 87.6 84.6  PLT 122* 97*   Basic Metabolic Panel: Recent Labs  Lab 03/14/21 0817 03/15/21 1109  NA 139 140  K 2.6* 3.6  CL 100 106  CO2 26 23  GLUCOSE 127* 137*  BUN 30* 30*  CREATININE 1.02* 0.86  CALCIUM 8.4* 8.2*  MG 1.7  --    GFR: Estimated Creatinine Clearance: 55.7 mL/min (by C-G formula based on SCr of 0.86 mg/dL). Liver Function Tests: Recent Labs  Lab 03/14/21 0817 03/15/21 1109  AST 30 27  ALT 30 27  ALKPHOS 331* 278*  BILITOT 0.5 0.4  PROT 7.0 6.1*  ALBUMIN 3.3* 2.6*   Recent Labs  Lab 03/15/21 1109  LIPASE 50   Recent Labs  Lab 03/15/21 1109  AMMONIA 23   Coagulation Profile: No results for input(s): INR, PROTIME in the last 168 hours. Cardiac Enzymes: No results for input(s): CKTOTAL, CKMB, CKMBINDEX, TROPONINI in the last 168 hours. BNP (last 3  results) No results for input(s): PROBNP in the last 8760 hours. HbA1C: No results for input(s): HGBA1C in the last 72 hours. CBG: No results for input(s): GLUCAP in the last 168 hours. Lipid Profile: No results for input(s): CHOL, HDL, LDLCALC, TRIG, CHOLHDL, LDLDIRECT in the last 72 hours. Thyroid Function Tests: No results for input(s): TSH, T4TOTAL, FREET4, T3FREE, THYROIDAB in the last 72 hours. Anemia Panel: No results for input(s): VITAMINB12, FOLATE, FERRITIN, TIBC, IRON, RETICCTPCT in the last 72 hours. Urine analysis:    Component Value Date/Time   COLORURINE YELLOW (A) 03/15/2021 1109   APPEARANCEUR HAZY (A) 03/15/2021 1109   APPEARANCEUR Clear 11/04/2013 1649   LABSPEC 1.020 03/15/2021 1109   LABSPEC 1.009 11/04/2013 1649   PHURINE 5.0 03/15/2021 1109   GLUCOSEU NEGATIVE 03/15/2021 1109   GLUCOSEU Negative 11/04/2013 1649   HGBUR MODERATE (A) 03/15/2021 1109   BILIRUBINUR MODERATE (A) 03/15/2021 1109   BILIRUBINUR Negative 11/04/2013 1649   KETONESUR NEGATIVE 03/15/2021 1109   PROTEINUR NEGATIVE 03/15/2021 1109   NITRITE NEGATIVE 03/15/2021 1109   LEUKOCYTESUR NEGATIVE 03/15/2021 1109   LEUKOCYTESUR Negative 11/04/2013 1649    Radiological Exams on Admission: DG Chest Portable 1 View  Result Date: 03/15/2021 CLINICAL DATA:  Shortness of breath EXAM: PORTABLE CHEST 1 VIEW COMPARISON:  03/07/2021 FINDINGS: Cardiac shadow is stable. Patchy airspace opacities are again identified slightly improved when compared with the prior exam. No new focal infiltrate is seen. No bony abnormality is noted. IMPRESSION: Slight improvement in previously seen airspace opacities bilaterally. Electronically Signed   By: Inez Catalina M.D.   On: 03/15/2021 11:38     Assessment/Plan Principal Problem:   Sepsis ([place]) Active Problems:   Iron deficiency anemia due to chronic blood loss   Depressive disorder   GERD (gastroesophageal reflux disease)   Postobstructive pneumonia    Malignant neoplasm of lung ([facility])   Acute respiratory failure with hypoxia (HCC)   Thrombocytopenia ([facility])   Hypokalemia   Hypomagnesemia     Sepsis from probable postobstructive pneumonia with acute respiratory failure As evidenced by tachycardia, marked leukocytosis with a left shift, pneumonia and elevated lactic acid level. Chest x-ray shows bibasilar opacities Patient was hypoxic in the field with room air pulse oximetry in the 80s and is currently on 2 L of oxygen with pulse oximetry greater than 92% Patient was recently treated with Levaquin antibiotic for pneumonia We will place patient on vancomycin and cefepime Follow-up results of blood cultures    Postobstructive pneumonia rule out aspiration Patient was recently diagnosed with metastatic adenocarcinoma of the lung She presents to the emergency room for evaluation of mental status changes and respiratory distress and was hypoxic in the field Place patient on aspiration precaution Place patient on vancomycin and cefepime adjusted to renal function.    Toxic metabolic encephalopathy Patient presents for evaluation of mental status changes and was lethargic upon arrival to the ED. She had a fentanyl patch which was removed and received a dose of Narcan with improvement in her mental status. Hold all opioids for now Patient is currently on a Narcan drip Strict aspiration precautions N.p.o. until patient is more awake and alert    Nicotine dependence Patient continues to smoke a couple of cigarettes per her sister Place patient on nicotine transdermal patch 14 mg daily    Depression and anxiety Hold anxiolytics and antidepressants for now until patient's mental status improved    Thrombocytopenia/anemia Most likely related to recent chemotherapy Will monitor cell counts closely  Hypokalemia/hypomagnesemia Supplement potassium and magnesium    Metastatic adenocarcinoma of the lung Status post recent  chemotherapy. Patient noted to have metastatic disease involving the left occipital calvarium with bony destruction and expansion and extension to involve the left temporal bone and skull base.  Mass-effect on the left cerebellum and possible involvement of the subjacent dural venous sinuses. Extensive lymphadenopathy including axillary nodes, subpectoral nodes, left supraclavicular node, right paratracheal node, right precarinal node, right hilar node, scattered pulmonary nodules multiple liver lesions as well as pathologic fracture of L2. We will request oncology consult    DVT prophylaxis: SCD Code Status: full code Family Communication: Greater than 50% of time was spent discussing patient's condition and plan of care with her sister and healthcare power of attorney (Sandy Pantaleon) who was at the bedside.  All questions and concerns have been addressed.  CODE STATUS was discussed and she wants her sister to remain a full code for now. Disposition Plan: Back to previous home environment Consults called: Oncology Status: At the time of admission, it appears that the appropriate admission status for this patient is inpatient.   This is judged to be reasonable and necessary in order to provide the required intensity of service to ensure the patient's safety given the presenting symptoms, physical exam findings, and initial radiographic and laboratory data in the context of their comorbid conditions.  Patient requires inpatient status due to high intensity of service, high risk for further deterioration and high frequency of surveillance required.    Collier Bullock MD [facility]     03/15/2021, 1:58 PM

## 2021-03-15 NOTE — Consult Note (Signed)
Pharmacy Antibiotic Note  Brenda Middleton is a 60 y.o. female admitted on 03/15/2021 with pneumonia.  Pharmacy has been consulted for Vancomycin & Cefepime dosing.  Plan: -- Vancomycin 1g x1; then vanc 1g q24h       Predicted AUC 541; Cmax 39; Cmin 12.2        (based on Scr 0.86; TBW<IBW; Vd 0.72) -- Cefepime 2g x1 in ED; then 2g q12h based on current renal fxn.  Follow up cultures, MRSA PCR, and trend renal fxn.  Height: 5\' 6"  (167.6 cm) Weight: 50.7 kg (111 lb 12.4 oz) IBW/kg (Calculated) : 59.3  No data recorded.  Recent Labs  Lab 03/14/21 0817 03/15/21 1109 03/15/21 1210  WBC 27.6* 18.1*  --   CREATININE 1.02* 0.86  --   LATICACIDVEN  --  2.0* 2.1*    Estimated Creatinine Clearance: 55.7 mL/min (by C-G formula based on SCr of 0.86 mg/dL).    Allergies  Allergen Reactions  . Penicillin G Hives    Other reaction(s): HIVES Other reaction(s): HIVES  . Penicillin V Itching  . Penicillins     Antimicrobials this admission: Vancomycin (4/12 >>  Cefepime (4/12 >>   Dose adjustments this admission: Will CTM; CFP interval q12h vs q8h   Microbiology results: 4/12 BCx: sent 4/12 MRSA PCR: ordered (after x1 ED dose)  Thank you for allowing pharmacy to be a part of this patient's care.  [name] 03/15/2021 2:27 PM

## 2021-03-16 ENCOUNTER — Other Ambulatory Visit: Payer: Self-pay | Admitting: Oncology

## 2021-03-16 ENCOUNTER — Encounter: Payer: Self-pay | Admitting: Oncology

## 2021-03-16 DIAGNOSIS — R6520 Severe sepsis without septic shock: Secondary | ICD-10-CM | POA: Diagnosis not present

## 2021-03-16 DIAGNOSIS — J9601 Acute respiratory failure with hypoxia: Secondary | ICD-10-CM | POA: Diagnosis not present

## 2021-03-16 DIAGNOSIS — A419 Sepsis, unspecified organism: Secondary | ICD-10-CM | POA: Diagnosis not present

## 2021-03-16 DIAGNOSIS — G928 Other toxic encephalopathy: Secondary | ICD-10-CM

## 2021-03-16 DIAGNOSIS — E876 Hypokalemia: Secondary | ICD-10-CM | POA: Diagnosis not present

## 2021-03-16 DIAGNOSIS — C342 Malignant neoplasm of middle lobe, bronchus or lung: Secondary | ICD-10-CM

## 2021-03-16 DIAGNOSIS — J189 Pneumonia, unspecified organism: Secondary | ICD-10-CM

## 2021-03-16 DIAGNOSIS — Z515 Encounter for palliative care: Secondary | ICD-10-CM

## 2021-03-16 DIAGNOSIS — T40601A Poisoning by unspecified narcotics, accidental (unintentional), initial encounter: Secondary | ICD-10-CM

## 2021-03-16 DIAGNOSIS — D696 Thrombocytopenia, unspecified: Secondary | ICD-10-CM

## 2021-03-16 LAB — BLOOD CULTURE ID PANEL (REFLEXED) - BCID2

## 2021-03-16 LAB — PROCALCITONIN: Procalcitonin: 0.13 ng/mL

## 2021-03-16 LAB — BASIC METABOLIC PANEL
Anion gap: 7 (ref 5–15)
BUN: 26 mg/dL — ABNORMAL HIGH (ref 6–20)
CO2: 24 mmol/L (ref 22–32)
Calcium: 7.7 mg/dL — ABNORMAL LOW (ref 8.9–10.3)
Chloride: 109 mmol/L (ref 98–111)
Creatinine, Ser: 0.72 mg/dL (ref 0.44–1.00)
GFR, Estimated: >60 mL/min (ref >60)
Glucose, Bld: 143 mg/dL — ABNORMAL HIGH (ref 70–99)
Potassium: 4 mmol/L (ref 3.5–5.1)
Sodium: 140 mmol/L (ref 135–145)

## 2021-03-16 LAB — CBC
HCT: 23.6 % — ABNORMAL LOW (ref 36.0–46.0)
Hemoglobin: 7.6 g/dL — ABNORMAL LOW (ref 12.0–15.0)
MCH: 27.1 pg (ref 26.0–34.0)
MCHC: 32.2 g/dL (ref 30.0–36.0)
MCV: 84.3 fL (ref 80.0–100.0)
Platelets: 99 10*3/uL — ABNORMAL LOW (ref 150–400)
RBC: 2.8 MIL/uL — ABNORMAL LOW (ref 3.87–5.11)
RDW: 14.9 % (ref 11.5–15.5)
WBC: 16.5 10*3/uL — ABNORMAL HIGH (ref 4.0–10.5)
nRBC: 0 % (ref 0.0–0.2)

## 2021-03-16 LAB — PROTIME-INR
INR: 1.2 (ref 0.8–1.2)
Prothrombin Time: 14.7 seconds (ref 11.4–15.2)

## 2021-03-16 LAB — CORTISOL-AM, BLOOD: Cortisol - AM: 1.4 ug/dL — ABNORMAL LOW (ref 6.7–22.6)

## 2021-03-16 MED ORDER — QUETIAPINE FUMARATE 25 MG PO TABS
200.0000 mg | ORAL_TABLET | Freq: Every day | ORAL | Status: DC
  Administered 2021-03-16: 200 mg via ORAL
  Filled 2021-03-16: qty 8

## 2021-03-16 MED ORDER — PANTOPRAZOLE SODIUM 40 MG PO TBEC
40.0000 mg | DELAYED_RELEASE_TABLET | Freq: Two times a day (BID) | ORAL | Status: DC
  Administered 2021-03-16 – 2021-03-17 (×2): 40 mg via ORAL
  Filled 2021-03-16 (×2): qty 1

## 2021-03-16 MED ORDER — BOOST / RESOURCE BREEZE PO LIQD CUSTOM
1.0000 | Freq: Three times a day (TID) | ORAL | Status: DC
  Administered 2021-03-17 (×2): 1 via ORAL

## 2021-03-16 MED ORDER — SERTRALINE HCL 50 MG PO TABS
50.0000 mg | ORAL_TABLET | Freq: Every day | ORAL | Status: DC
  Administered 2021-03-16 – 2021-03-17 (×2): 50 mg via ORAL
  Filled 2021-03-16 (×2): qty 1

## 2021-03-16 MED ORDER — CLONAZEPAM 0.5 MG PO TABS
0.5000 mg | ORAL_TABLET | Freq: Two times a day (BID) | ORAL | Status: DC | PRN
  Administered 2021-03-16 – 2021-03-17 (×2): 0.5 mg via ORAL
  Filled 2021-03-16 (×2): qty 1

## 2021-03-16 MED ORDER — OXYCODONE-ACETAMINOPHEN 5-325 MG PO TABS
1.0000 | ORAL_TABLET | Freq: Four times a day (QID) | ORAL | Status: DC | PRN
  Administered 2021-03-16: 2 via ORAL
  Administered 2021-03-17: 1 via ORAL
  Administered 2021-03-17: 2 via ORAL
  Filled 2021-03-16 (×3): qty 2

## 2021-03-16 MED ORDER — SODIUM CHLORIDE 0.9 % IV SOLN
2.0000 g | Freq: Three times a day (TID) | INTRAVENOUS | Status: DC
  Administered 2021-03-16 – 2021-03-17 (×4): 2 g via INTRAVENOUS
  Filled 2021-03-16 (×7): qty 2

## 2021-03-16 MED ORDER — ALPRAZOLAM 0.5 MG PO TABS: 0.5000 mg | ORAL_TABLET | Freq: Once | ORAL | Status: DC

## 2021-03-16 MED ORDER — ADULT MULTIVITAMIN W/MINERALS CH
1.0000 | ORAL_TABLET | Freq: Every day | ORAL | Status: DC
  Administered 2021-03-17: 1 via ORAL
  Filled 2021-03-16: qty 1

## 2021-03-16 MED ORDER — LACTATED RINGERS IV SOLN: INTRAVENOUS | Status: DC

## 2021-03-16 NOTE — Progress Notes (Signed)
PROGRESS NOTE    September Mormile Gorsline  TGG:[id number] DOB: Jan 16, 1961 DOA: 03/15/2021 PCP: Remi Haggard, FNP  Brief Narrative: Chronically ill 60 year old female with widely metastatic non-small cell lung cancer, COPD, tobacco abuse, recently hospitalized for HCAP versus lymphangitic tumor spread treated with empiric antibiotics and discharged, subsequently started on Levaquin at the cancer center for possible postobstructive pneumonia, treated with cycle 1 day 1 of reduced dose carboplatin/Alimta on 4/11. -History of substance abuse, and metastasis related chronic pain, on fentanyl patch and as needed Percocet.  Was brought to the ED by her sister 4/12 with mental status change, in the emergency room she was noted to be hypoxic requiring oxygen supplementation along with metabolic encephalopathy started on Narcan and fentanyl discontinued. -Chest x-ray noted bilateral airspace opacities however improved from prior   Assessment & Plan:   Toxic encephalopathy Polypharmacy -Patient is on extensive regimen of sedating medicines including fentanyl patch, BuSpar, Neurontin (per sister taking 1200 mg twice daily) instead of 300 daily, hydroxyzine, Percocet, Compazine, Phenergan, Seroquel, Klonopin -Mental status is improved, fentanyl patch discontinued yesterday, will restart Percocet as needed along with Klonopin -Remains on dexamethasone for brain metastasis -Low clinical suspicion for seizures however will check EEG -Caution with meds, will need family to manage her home medications  Postobstructive pneumonia versus aspiration -Clinically do not suspect pneumonia recurrence -Continue empiric cefepime today, discontinue vancomycin, follow-up cultures  Widely metastatic adenocarcinoma of lung -Just received first dose of first cycle on 4/11 -Prognosis is very poor, patient has extremely poor insight, oncology following -Palliative medicine consulted, discussed poor prognosis is patient's sister  who also is anxious about how to take care of this patient  Hypokalemia Hypomagnesemia -Replace  COPD Tobacco abuse -stable, nebs PRN  Anemia Thrombocytopenia -Secondary to cancer and chemotherapy, trend  DVT prophylaxis: SCDs Code Status: Full code Family Communication: Called and updated Sister Disposition Plan:  Status is: Inpatient  Remains inpatient appropriate because:Inpatient level of care appropriate due to severity of illness   Dispo: The patient is from: Home              Anticipated d/c is to: Home              Patient currently is not medically stable to d/c.   Difficult to place patient No        Consultants:   Oncology  Palliative medicine   Procedures:   Antimicrobials:    Subjective: -Adamant to go home, will not answer any questions, angry and upset about being in the hospital  Objective: Vitals:   03/16/21 0930 03/16/21 1000 03/16/21 1100 03/16/21 1200  BP:  (!) 148/88 (!) 156/84 (!) 157/90  Pulse: (!) 127 (!) 117 (!) 115 (!) 125  Resp: (!) 22 13 13 16   Temp:      TempSrc:      SpO2: (!) 84% 94% 94% 94%  Weight:      Height:        Intake/Output Summary (Last 24 hours) at 03/16/2021 1422 Last data filed at 03/16/2021 1000 Gross per 24 hour  Intake 3521.05 ml  Output 1525 ml  Net 1996.05 ml   Filed Weights   03/15/21 1119  Weight: 50.7 kg    Examination:  General exam: Chronically ill female laying in bed, awake alert oriented to self and place, partly to time only, cognitive deficits noted, CVS: S1-S2, regular rhythm, tachycardic Lungs: Scattered rhonchi at bases Abdomen: Soft, nontender, bowel sounds present Extremities: No edema Neuro: Moves all extremities,  no localizing signs  Psychiatry: Angry and irritable    Data Reviewed:   CBC: Recent Labs  Lab 03/14/21 0817 03/15/21 1109 03/16/21 0314  WBC 27.6* 18.1* 16.5*  NEUTROABS 24.3* 16.9*  --   HGB 9.7* 8.5* 7.6*  HCT 30.3* 25.9* 23.6*  MCV 87.6 84.6  84.3  PLT 122* 97* 99*   Basic Metabolic Panel: Recent Labs  Lab 03/14/21 0817 03/15/21 1109 03/16/21 0314  NA 139 140 140  K 2.6* 3.6 4.0  CL 100 106 109  CO2 26 23 24   GLUCOSE 127* 137* 143*  BUN 30* 30* 26*  CREATININE 1.02* 0.86 0.72  CALCIUM 8.4* 8.2* 7.7*  MG 1.7  --   --    GFR: Estimated Creatinine Clearance: 59.9 mL/min (by C-G formula based on SCr of 0.72 mg/dL). Liver Function Tests: Recent Labs  Lab 03/14/21 0817 03/15/21 1109  AST 30 27  ALT 30 27  ALKPHOS 331* 278*  BILITOT 0.5 0.4  PROT 7.0 6.1*  ALBUMIN 3.3* 2.6*   Recent Labs  Lab 03/15/21 1109  LIPASE 50   Recent Labs  Lab 03/15/21 1109  AMMONIA 23   Coagulation Profile: Recent Labs  Lab 03/16/21 0314  INR 1.2   Cardiac Enzymes: No results for input(s): CKTOTAL, CKMB, CKMBINDEX, TROPONINI in the last 168 hours. BNP (last 3 results) No results for input(s): PROBNP in the last 8760 hours. HbA1C: No results for input(s): HGBA1C in the last 72 hours. CBG: No results for input(s): GLUCAP in the last 168 hours. Lipid Profile: No results for input(s): CHOL, HDL, LDLCALC, TRIG, CHOLHDL, LDLDIRECT in the last 72 hours. Thyroid Function Tests: No results for input(s): TSH, T4TOTAL, FREET4, T3FREE, THYROIDAB in the last 72 hours. Anemia Panel: No results for input(s): VITAMINB12, FOLATE, FERRITIN, TIBC, IRON, RETICCTPCT in the last 72 hours. Urine analysis:    Component Value Date/Time   COLORURINE YELLOW (A) 03/15/2021 1109   APPEARANCEUR HAZY (A) 03/15/2021 1109   APPEARANCEUR Clear 11/04/2013 1649   LABSPEC 1.020 03/15/2021 1109   LABSPEC 1.009 11/04/2013 1649   PHURINE 5.0 03/15/2021 1109   GLUCOSEU NEGATIVE 03/15/2021 1109   GLUCOSEU Negative 11/04/2013 1649   HGBUR MODERATE (A) 03/15/2021 1109   BILIRUBINUR MODERATE (A) 03/15/2021 1109   BILIRUBINUR Negative 11/04/2013 1649   KETONESUR NEGATIVE 03/15/2021 1109   PROTEINUR NEGATIVE 03/15/2021 1109   NITRITE NEGATIVE 03/15/2021  1109   LEUKOCYTESUR NEGATIVE 03/15/2021 1109   LEUKOCYTESUR Negative 11/04/2013 1649   Sepsis Labs: @LABRCNTIP (procalcitonin:4,lacticidven:4)  ) Recent Results (from the past 240 hour(s))  Resp Panel by RT-PCR (Flu A&B, Covid) Nasopharyngeal Swab     Status: None   Collection Time: 03/07/21  9:24 PM   Specimen: Nasopharyngeal Swab; Nasopharyngeal(NP) swabs in vial transport medium  Result Value Ref Range Status   SARS Coronavirus 2 by RT PCR NEGATIVE NEGATIVE Final    Comment: (NOTE) SARS-CoV-2 target nucleic acids are NOT DETECTED.  The SARS-CoV-2 RNA is generally detectable in upper respiratory specimens during the acute phase of infection. The lowest concentration of SARS-CoV-2 viral copies this assay can detect is 138 copies/mL. A negative result does not preclude SARS-Cov-2 infection and should not be used as the sole basis for treatment or other patient management decisions. A negative result may occur with  improper specimen collection/handling, submission of specimen other than nasopharyngeal swab, presence of viral mutation(s) within the areas targeted by this assay, and inadequate number of viral copies(<138 copies/mL). A negative result must be combined with clinical observations,  patient history, and epidemiological information. The expected result is Negative.  Fact Sheet for Patients:  EntrepreneurPulse.com.au  Fact Sheet for Healthcare Providers:  IncredibleEmployment.be  This test is no t yet approved or cleared by the [place] FDA and  has been authorized for detection and/or diagnosis of SARS-CoV-2 by FDA under an Emergency Use Authorization (EUA). This EUA will remain  in effect (meaning this test can be used) for the duration of the COVID-19 declaration under Section 564(b)(1) of the Act, 21 U.S.C.section 360bbb-3(b)(1), unless the authorization is terminated  or revoked sooner.       Influenza A by PCR NEGATIVE  NEGATIVE Final   Influenza B by PCR NEGATIVE NEGATIVE Final    Comment: (NOTE) The Xpert Xpress SARS-CoV-2/FLU/RSV plus assay is intended as an aid in the diagnosis of influenza from Nasopharyngeal swab specimens and should not be used as a sole basis for treatment. Nasal washings and aspirates are unacceptable for Xpert Xpress SARS-CoV-2/FLU/RSV testing.  Fact Sheet for Patients: EntrepreneurPulse.com.au  Fact Sheet for Healthcare Providers: IncredibleEmployment.be  This test is not yet approved or cleared by the [place] FDA and has been authorized for detection and/or diagnosis of SARS-CoV-2 by FDA under an Emergency Use Authorization (EUA). This EUA will remain in effect (meaning this test can be used) for the duration of the COVID-19 declaration under Section 564(b)(1) of the Act, 21 U.S.C. section 360bbb-3(b)(1), unless the authorization is terminated or revoked.  Performed at [facility], [address] [place], [place] [address]   Blood culture (routine x 2)     Status: None   Collection Time: 03/07/21  9:24 PM   Specimen: BLOOD  Result Value Ref Range Status   Specimen Description BLOOD BLOOD RIGHT HAND  Final   Special Requests   Final    BOTTLES DRAWN AEROBIC AND ANAEROBIC Blood Culture adequate volume   Culture   Final    NO GROWTH 5 DAYS Performed at [facility], [address] [place], [place] [address]    Report Status 03/12/2021 FINAL  Final  Blood culture (routine x 2)     Status: None   Collection Time: 03/07/21  9:24 PM   Specimen: BLOOD  Result Value Ref Range Status   Specimen Description BLOOD RIGHT ANTECUBITAL  Final   Special Requests   Final    BOTTLES DRAWN AEROBIC AND ANAEROBIC Blood Culture adequate volume   Culture   Final    NO GROWTH 5 DAYS Performed at [facility], [address] [place], [place] [address]    Report Status 03/12/2021 FINAL  Final  Culture,  blood (routine x 2)     Status: None (Preliminary result)   Collection Time: 03/15/21 11:09 AM   Specimen: BLOOD  Result Value Ref Range Status   Specimen Description BLOOD RIGHT HAND  Final   Special Requests   Final    BOTTLES DRAWN AEROBIC AND ANAEROBIC Blood Culture results may not be optimal due to an inadequate volume of blood received in culture bottles   Culture  Setup Time   Final    Organism ID to follow GRAM POSITIVE COCCI AEROBIC BOTTLE ONLY CRITICAL RESULT CALLED TO, READ BACK BY AND VERIFIED WITH: ABBY ELLINGTON AT 1103 03/16/21 SDR Performed at [facility], [address] [place], [place] [address]    Culture GRAM POSITIVE COCCI  Final   Report Status PENDING  Incomplete  Resp Panel by RT-PCR (Flu A&B, Covid) Nasopharyngeal Swab  Status: None   Collection Time: 03/15/21 11:09 AM   Specimen: Nasopharyngeal Swab; Nasopharyngeal(NP) swabs in vial transport medium  Result Value Ref Range Status   SARS Coronavirus 2 by RT PCR NEGATIVE NEGATIVE Final    Comment: (NOTE) SARS-CoV-2 target nucleic acids are NOT DETECTED.  The SARS-CoV-2 RNA is generally detectable in upper respiratory specimens during the acute phase of infection. The lowest concentration of SARS-CoV-2 viral copies this assay can detect is 138 copies/mL. A negative result does not preclude SARS-Cov-2 infection and should not be used as the sole basis for treatment or other patient management decisions. A negative result may occur with  improper specimen collection/handling, submission of specimen other than nasopharyngeal swab, presence of viral mutation(s) within the areas targeted by this assay, and inadequate number of viral copies(<138 copies/mL). A negative result must be combined with clinical observations, patient history, and epidemiological information. The expected result is Negative.  Fact Sheet for Patients:  EntrepreneurPulse.com.au  Fact Sheet for Healthcare  Providers:  IncredibleEmployment.be  This test is no t yet approved or cleared by the [place] FDA and  has been authorized for detection and/or diagnosis of SARS-CoV-2 by FDA under an Emergency Use Authorization (EUA). This EUA will remain  in effect (meaning this test can be used) for the duration of the COVID-19 declaration under Section 564(b)(1) of the Act, 21 U.S.C.section 360bbb-3(b)(1), unless the authorization is terminated  or revoked sooner.       Influenza A by PCR NEGATIVE NEGATIVE Final   Influenza B by PCR NEGATIVE NEGATIVE Final    Comment: (NOTE) The Xpert Xpress SARS-CoV-2/FLU/RSV plus assay is intended as an aid in the diagnosis of influenza from Nasopharyngeal swab specimens and should not be used as a sole basis for treatment. Nasal washings and aspirates are unacceptable for Xpert Xpress SARS-CoV-2/FLU/RSV testing.  Fact Sheet for Patients: EntrepreneurPulse.com.au  Fact Sheet for Healthcare Providers: IncredibleEmployment.be  This test is not yet approved or cleared by the [place] FDA and has been authorized for detection and/or diagnosis of SARS-CoV-2 by FDA under an Emergency Use Authorization (EUA). This EUA will remain in effect (meaning this test can be used) for the duration of the COVID-19 declaration under Section 564(b)(1) of the Act, 21 U.S.C. section 360bbb-3(b)(1), unless the authorization is terminated or revoked.  Performed at [facility], [address] [place], [place] [address]   Blood Culture ID Panel (Reflexed)     Status: Abnormal   Collection Time: 03/15/21 11:09 AM  Result Value Ref Range Status   Enterococcus faecalis NOT DETECTED NOT DETECTED Final   Enterococcus Faecium NOT DETECTED NOT DETECTED Final   Listeria monocytogenes NOT DETECTED NOT DETECTED Final   Staphylococcus species DETECTED (A) NOT DETECTED Final    Comment: CRITICAL RESULT CALLED  TO, READ BACK BY AND VERIFIED WITH:  ABBY ELLINGTON AT 1103 03/16/21 SDR    Staphylococcus aureus (BCID) NOT DETECTED NOT DETECTED Final   Staphylococcus epidermidis DETECTED (A) NOT DETECTED Final    Comment: Methicillin (oxacillin) resistant coagulase negative staphylococcus. Possible blood culture contaminant (unless isolated from more than one blood culture draw or clinical case suggests pathogenicity). No antibiotic treatment is indicated for blood  culture contaminants. CRITICAL RESULT CALLED TO, READ BACK BY AND VERIFIED WITH:  ABBY ELLINGTON AT 2703 03/16/21 SDR    Staphylococcus lugdunensis NOT DETECTED NOT DETECTED Final   Streptococcus species NOT DETECTED NOT DETECTED Final   Streptococcus agalactiae NOT DETECTED NOT DETECTED Final   Streptococcus pneumoniae  NOT DETECTED NOT DETECTED Final   Streptococcus pyogenes NOT DETECTED NOT DETECTED Final   A.calcoaceticus-baumannii NOT DETECTED NOT DETECTED Final   Bacteroides fragilis NOT DETECTED NOT DETECTED Final   Enterobacterales NOT DETECTED NOT DETECTED Final   Enterobacter cloacae complex NOT DETECTED NOT DETECTED Final   Escherichia coli NOT DETECTED NOT DETECTED Final   Klebsiella aerogenes NOT DETECTED NOT DETECTED Final   Klebsiella oxytoca NOT DETECTED NOT DETECTED Final   Klebsiella pneumoniae NOT DETECTED NOT DETECTED Final   Proteus species NOT DETECTED NOT DETECTED Final   Salmonella species NOT DETECTED NOT DETECTED Final   Serratia marcescens NOT DETECTED NOT DETECTED Final   Haemophilus influenzae NOT DETECTED NOT DETECTED Final   Neisseria meningitidis NOT DETECTED NOT DETECTED Final   Pseudomonas aeruginosa NOT DETECTED NOT DETECTED Final   Stenotrophomonas maltophilia NOT DETECTED NOT DETECTED Final   Candida albicans NOT DETECTED NOT DETECTED Final   Candida auris NOT DETECTED NOT DETECTED Final   Candida glabrata NOT DETECTED NOT DETECTED Final   Candida krusei NOT DETECTED NOT DETECTED Final   Candida  parapsilosis NOT DETECTED NOT DETECTED Final   Candida tropicalis NOT DETECTED NOT DETECTED Final   Cryptococcus neoformans/gattii NOT DETECTED NOT DETECTED Final   Methicillin resistance mecA/C DETECTED (A) NOT DETECTED Final    Comment: CRITICAL RESULT CALLED TO, READ BACK BY AND VERIFIED WITH:  ABBY ELLINGTON AT 1103 03/16/21 SDR Performed at [facility], [address] [place], [place] [address]   Culture, blood (routine x 2)     Status: None (Preliminary result)   Collection Time: 03/15/21 12:10 PM   Specimen: BLOOD  Result Value Ref Range Status   Specimen Description BLOOD RIGHT [facility]  Final   Special Requests   Final    BOTTLES DRAWN AEROBIC AND ANAEROBIC Blood Culture adequate volume   Culture   Final    NO GROWTH < 24 HOURS Performed at [facility], [address] [place], [place] [address]    Report Status PENDING  Incomplete  MRSA PCR Screening     Status: None   Collection Time: 03/15/21  2:30 PM   Specimen: Nasal Mucosa; Nasopharyngeal  Result Value Ref Range Status   MRSA by PCR NEGATIVE NEGATIVE Final    Comment:        The GeneXpert MRSA Assay (FDA approved for NASAL specimens only), is one component of a comprehensive MRSA colonization surveillance program. It is not intended to diagnose MRSA infection nor to guide or monitor treatment for MRSA infections. Performed at [facility], [address], [place], [place] [address]          Radiology Studies: DG Chest Portable 1 View  Result Date: 03/15/2021 CLINICAL DATA:  Shortness of breath EXAM: PORTABLE CHEST 1 VIEW COMPARISON:  03/07/2021 FINDINGS: Cardiac shadow is stable. Patchy airspace opacities are again identified slightly improved when compared with the prior exam. No new focal infiltrate is seen. No bony abnormality is noted. IMPRESSION: Slight improvement in previously seen airspace opacities bilaterally. Electronically Signed   By: Inez Catalina M.D.   On:  03/15/2021 11:38        Scheduled Meds: . Chlorhexidine Gluconate Cloth  6 each Topical Daily  . dexamethasone  4 mg Oral BID  . feeding supplement  1 Container Oral TID BM  . guaiFENesin  1,200 mg Oral BID  . mouth rinse  15 mL Mouth Rinse BID  . mometasone-formoterol  2 puff Inhalation BID  . multivitamin  with minerals  1 tablet Oral Daily  . nicotine  14 mg Transdermal Daily  . pantoprazole (PROTONIX) IV  40 mg Intravenous Q24H  . potassium chloride SA  40 mEq Oral Daily   Continuous Infusions: . ceFEPime (MAXIPIME) IV    . lactated ringers 75 mL/hr at 03/16/21 1045     LOS: 1 day    Time spent:59min  Domenic Polite, MD [facility] 03/16/2021, 2:22 PM

## 2021-03-16 NOTE — Progress Notes (Signed)
Hematology/Oncology Progress Note [facility] [phone] [phone]  Patient Care Team: Remi Haggard, FNP as PCP - General (Family Medicine) Remi Haggard, FNP (Family Medicine) Christene Lye, MD (General Surgery) Telford Nab, RN as Oncology Nurse Navigator   Name of the patient: Brenda Middleton  [id number]  04/27/1961  Date of visit: 03/16/21   INTERVAL HISTORY-  Sister is at the bedside.  Mental status has improved compared to yesterday.  She reports not remembering what happened yesterday.  She wants to sign out AMA today and her sister has tried to convince her to stay. She denies any pain currently.  She reports feeling frustrated   Review of systems- Review of Systems  Constitutional: Positive for appetite change and fatigue.  HENT:         Hoarseness  Respiratory: Positive for cough and shortness of breath.   Cardiovascular: Negative for chest pain and leg swelling.  Gastrointestinal: Negative for abdominal pain.  Genitourinary: Negative for dysuria.   Musculoskeletal: Negative for back pain.  Skin: Negative for itching.  Neurological: Negative for headaches.  Hematological: Negative for adenopathy.    Allergies  Allergen Reactions  . Penicillin G Hives    Other reaction(s): HIVES Other reaction(s): HIVES  . Penicillin V Itching  . Penicillins     Patient Active Problem List   Diagnosis Date Noted  . Opiate overdose ([place])   . Thrombocytopenia ([place]) 03/15/2021  . Hypokalemia 03/15/2021  . Hypomagnesemia 03/15/2021  . Acute respiratory failure with hypoxia ([place]) 03/07/2021  . Postobstructive pneumonia 02/27/2021  . HTN (hypertension) 02/27/2021  . Depression with anxiety 02/27/2021  . Normocytic anemia 02/27/2021  . Sepsis ([place]) 02/27/2021  . Protein-calorie malnutrition, moderate ([place]) 02/27/2021  . Malignant neoplasm of lung ([place]) 02/27/2021  . Elevated troponin 02/27/2021  . Toxic  metabolic encephalopathy 41/28/7867  . Brain metastases ([place]) 02/27/2021  . Shortness of breath   . Metastatic malignant neoplasm ([place])   . Intractable low back pain 02/18/2021  . Anxiety   . Depressive disorder   . Pathologic fracture of lumbar vertebra, initial encounter   . 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 COPD exacerbation ([place])   . GERD (gastroesophageal reflux disease)   . Palliative care encounter   . Pathologic compression fracture of lumbar vertebra ([place])   . Goals of care, counseling/discussion   . Neoplasm related pain   . Iron deficiency anemia due to chronic blood loss 09/12/2019  . Family history of cancer 09/12/2019  . Blood in the stool 09/12/2019  . Elevated alkaline phosphatase level 09/12/2019  . Chronic low back pain 08/26/2014     Past Medical History:  Diagnosis Date  . Anxiety   . Benign neoplasm of cervix uteri   . Chronic hepatitis C without mention of hepatic coma   . Chronic low back pain 08/26/2014  . Constipation   . Depressive disorder   . Displacement of cervical intervertebral disc   . Hypertensive disorder   . Iron deficiency anemia due to chronic blood loss 09/12/2019  . Palpitations   . Prolapsed cervical intervertebral disc      Past Surgical History:  Procedure Laterality Date  . CONIZATION CERVIX    . DILATION AND CURETTAGE OF UTERUS    . HAND SURGERY  2008,2015   Carpel Tunnel  . TONSILLECTOMY    . VULVECTOMY      Social History   Socioeconomic History  . Marital status: Single  Spouse name: Not on file  . Number of children: 0  . Years of education: college1  . Highest education level: Not on file  Occupational History    Employer: UNEMPLOYED  Tobacco Use  . Smoking status: Former Smoker    Packs/day: 0.70    Years: 35.00    Pack years: 24.50    Types: Cigarettes  . Smokeless tobacco: Never Used  Vaping Use  . Vaping Use: Never used  Substance and Sexual Activity  . Alcohol use: Yes    Comment:  occasional wine  . Drug use: No  . Sexual activity: Not Currently  Other Topics Concern  . Not on file  Social History Narrative  . Not on file   Social Determinants of Health   Financial Resource Strain: Not on file  Food Insecurity: Not on file  Transportation Needs: Not on file  Physical Activity: Not on file  Stress: Not on file  Social Connections: Not on file  Intimate Partner Violence: Not on file     Family History  Problem Relation Age of Onset  . Breast cancer Mother 57  . Lung cancer Father   . Cancer Brother   . Cancer Other   . Stroke Other      Current Facility-Administered Medications:  .  acetaminophen (TYLENOL) tablet 650 mg, 650 mg, Oral, Q6H PRN **OR** acetaminophen (TYLENOL) suppository 650 mg, 650 mg, Rectal, Q6H PRN, Agbata, Tochukwu, MD .  ceFEPIme (MAXIPIME) 2 g in sodium chloride 0.9 % 100 mL IVPB, 2 g, Intravenous, Q12H, Beers, Shanon Brow, RPH, Stopped at 03/16/21 0246 .  Chlorhexidine Gluconate Cloth 2 % PADS 6 each, 6 each, Topical, Daily, Agbata, Tochukwu, MD, 6 each at 03/15/21 1550 .  dexamethasone (DECADRON) tablet 4 mg, 4 mg, Oral, BID, Agbata, Tochukwu, MD, 4 mg at 03/16/21 1156 .  feeding supplement (BOOST / RESOURCE BREEZE) liquid 1 Container, 1 Container, Oral, TID BM, Domenic Polite, MD .  guaiFENesin (MUCINEX) 12 hr tablet 1,200 mg, 1,200 mg, Oral, BID, Agbata, Tochukwu, MD, 1,200 mg at 03/16/21 1045 .  ipratropium-albuterol (DUONEB) 0.5-2.5 (3) MG/3ML nebulizer solution 3 mL, 3 mL, Nebulization, Q6H PRN, Agbata, Tochukwu, MD, 3 mL at 03/15/21 1606 .  lactated ringers infusion, , Intravenous, Continuous, Domenic Polite, MD, Last Rate: 75 mL/hr at 03/16/21 1045, New Bag at 03/16/21 1045 .  magic mouthwash, 5 mL, Oral, TID PRN, Agbata, Tochukwu, MD .  MEDLINE mouth rinse, 15 mL, Mouth Rinse, BID, Agbata, Tochukwu, MD, 15 mL at 03/15/21 2215 .  mometasone-formoterol (DULERA) 100-5 MCG/ACT inhaler 2 puff, 2 puff, Inhalation, BID, Agbata,  Tochukwu, MD, 2 puff at 03/16/21 1157 .  multivitamin with minerals tablet 1 tablet, 1 tablet, Oral, Daily, Domenic Polite, MD .  nicotine (NICODERM CQ - dosed in mg/24 hours) patch 14 mg, 14 mg, Transdermal, Daily, Agbata, Tochukwu, MD, 14 mg at 03/16/21 1045 .  ondansetron (ZOFRAN) tablet 4 mg, 4 mg, Oral, Q6H PRN **OR** ondansetron (ZOFRAN) injection 4 mg, 4 mg, Intravenous, Q6H PRN, Agbata, Tochukwu, MD .  oxyCODONE-acetaminophen (PERCOCET/ROXICET) 5-325 MG per tablet 1-2 tablet, 1-2 tablet, Oral, Q6H PRN, Domenic Polite, MD .  pantoprazole (PROTONIX) injection 40 mg, 40 mg, Intravenous, Q24H, Agbata, Tochukwu, MD, 40 mg at 03/15/21 2100 .  potassium chloride SA (KLOR-CON) CR tablet 40 mEq, 40 mEq, Oral, Daily, Agbata, Tochukwu, MD, 40 mEq at 03/16/21 1045   Physical exam:  Vitals:   03/16/21 0930 03/16/21 1000 03/16/21 1100 03/16/21 1200  BP:  (!) 148/88 Marland Kitchen)  156/84 (!) 157/90  Pulse: (!) 127 (!) 117 (!) 115 (!) 125  Resp: (!) 22 13 13 16   Temp:      TempSrc:      SpO2: (!) 84% 94% 94% 94%  Weight:      Height:       Physical Exam Constitutional:      General: She is not in acute distress.    Appearance: She is not diaphoretic.  HENT:     Head: Normocephalic and atraumatic.     Nose: Nose normal.     Mouth/Throat:     Pharynx: No oropharyngeal exudate.  Eyes:     General: No scleral icterus.    Pupils: Pupils are equal, round, and reactive to light.  Cardiovascular:     Rate and Rhythm: Normal rate and regular rhythm.     Heart sounds: No murmur heard.   Pulmonary:     Comments: On nasal cannula oxygen, bilateral wheezing and rhonchi Chest:     Chest wall: No tenderness.  Abdominal:     General: There is no distension.     Palpations: Abdomen is soft.     Tenderness: There is no abdominal tenderness.  Musculoskeletal:        General: Normal range of motion.     Cervical back: Normal range of motion and neck supple.  Skin:    General: Skin is warm and dry.      Findings: No erythema.  Neurological:     Mental Status: She is alert and oriented to person, place, and time.     Cranial Nerves: No cranial nerve deficit.     Motor: No abnormal muscle tone.     Coordination: Coordination normal.  Psychiatric:        Mood and Affect: Affect normal.        CMP Latest Ref Rng & Units 03/16/2021  Glucose 70 - 99 mg/dL 143(H)  BUN 6 - 20 mg/dL 26(H)  Creatinine 0.44 - 1.00 mg/dL 0.72  Sodium 135 - 145 mmol/L 140  Potassium 3.5 - 5.1 mmol/L 4.0  Chloride 98 - 111 mmol/L 109  CO2 22 - 32 mmol/L 24  Calcium 8.9 - 10.3 mg/dL 7.7(L)  Total Protein 6.5 - 8.1 g/dL -  Total Bilirubin 0.3 - 1.2 mg/dL -  Alkaline Phos 38 - 126 U/L -  AST 15 - 41 U/L -  ALT 0 - 44 U/L -   CBC Latest Ref Rng & Units 03/16/2021  WBC 4.0 - 10.5 K/uL 16.5(H)  Hemoglobin 12.0 - 15.0 g/dL 7.6(L)  Hematocrit 36.0 - 46.0 % 23.6(L)  Platelets 150 - 400 K/uL 99(L)    RADIOGRAPHIC STUDIES: I have personally reviewed the radiological images as listed and agreed with the findings in the report. DG Chest 2 View  Result Date: 03/05/2021 CLINICAL DATA:  Shortness of breath EXAM: CHEST - 2 VIEW COMPARISON:  02/27/2021 FINDINGS: Heart is normal size. Right upper lobe mass again noted as seen on CT. Patchy bilateral airspace disease again noted, similar to prior study. No effusions or acute bony abnormality. IMPRESSION: Patchy bilateral airspace disease again noted, not significantly changed. Right upper lobe mass again seen. Electronically Signed   By: Rolm Baptise M.D.   On: 03/05/2021 21:42   DG Chest 2 View  Result Date: 02/15/2021 CLINICAL DATA:  Weight loss and cough EXAM: CHEST - 2 VIEW COMPARISON:  None. FINDINGS: The heart size and mediastinal contours are within normal limits. Rounded patchy nodular opacity seen  within the right upper lobe and right mid lung. The left lung is clear. No acute osseous abnormality. IMPRESSION: Rounded patchy airspace opacities in the right mid lung  which may be due to infectious etiology, however cannot exclude metastatic disease. Would recommend CT chest with contrast for further evaluation. Electronically Signed   By: Prudencio Pair M.D.   On: 02/15/2021 12:16   CT Head Wo Contrast  Result Date: 02/15/2021 CLINICAL DATA:  Headache with left-sided face/neck/head pain. On antibiotics for reported laryngitis with bilateral ear pain. EXAM: CT HEAD WITHOUT CONTRAST CT MAXILLOFACIAL WITHOUT CONTRAST TECHNIQUE: Multidetector CT imaging of the head and maxillofacial structures were performed using the standard protocol without intravenous contrast. Multiplanar CT image reconstructions of the maxillofacial structures were also generated. COMPARISON:  None. FINDINGS: CT HEAD FINDINGS Brain: The calvarial/skull base mass lesion is described below. Spur this process exerts mass effect on the left cerebellum with suspected narrowing of the fourth ventricle. No evidence of hydrocephalus. No evidence of acute large vascular territory infarct. No acute hemorrhage. Mild to moderate scattered white matter hypodensities, which are nonspecific but most likely relate to chronic microvascular ischemic disease. No midline shift. Vascular: No hyperdense vessel identified. Calcific atherosclerosis. Skull/skull base: There is aggressive soft tissue mass lesion involving the left occipital calvarium measuring up to approximately 5.5 x 3.1 by 3.3 cm (transverse by AP by craniocaudal) with bony destruction and expansion and extension inferiorly to involve the left temporal bone and skull base. There is mass effect on the inferior left cerebellum and possible involvement of the subjacent dural venous sinuses. Small left mastoid effusion. CT MAXILLOFACIAL FINDINGS Osseous: No fracture or mandibular dislocation. No destructive process in the face. Orbits: Negative. No traumatic or inflammatory finding. Sinuses: Small right maxillary sinus retention cyst. Otherwise, sinuses are clear.  Soft tissues: Negative. 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 Resulting mass effect on the left cerebellum and possible involvement of the subjacent dural venous sinuses. 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 Osteomyelitis is a differential consideration. Recommend MRI brain with and without contrast to further evaluate. Findings and recommendations discussed with Ashok Cordia, PA via telephone at 11:20 a.m. Electronically Signed   By: Margaretha Sheffield MD   On: 02/15/2021 11:29   CT CHEST W CONTRAST  Result Date: 03/06/2021 CLINICAL DATA:  Respiratory failure, shortness of breath EXAM: CT CHEST WITH CONTRAST TECHNIQUE: Multidetector CT imaging of the chest was performed during intravenous contrast administration. CONTRAST:  52mL OMNIPAQUE IOHEXOL 300 MG/ML  SOLN COMPARISON:  02/27/2021.  Chest x-ray 03/05/2021 FINDINGS: Cardiovascular: Heart is normal size. Coronary artery and aortic calcifications. No aneurysm. Mediastinum/Nodes: Bulky mediastinal and bilateral hilar adenopathy again noted, unchanged. Trachea is patent. Thyroid unremarkable. Lungs/Pleura: Ground-glass airspace opacities in the lower lobes have worsened since prior study, particularly in the left lower lobe. Ground-glass opacities in the upper lobes somewhat improved since prior study. Numerous bilateral pulmonary nodules are stable. Right upper lobe mass again noted and stable. No effusions. Upper Abdomen: Imaging into the upper abdomen demonstrates no acute findings. Musculoskeletal: Chest wall soft tissues are unremarkable. Multiple osseous metastases again noted, unchanged. IMPRESSION: Redemonstrated is extensive metastatic disease in the chest with numerous pulmonary nodules, dominant right upper lobe mass, and bulky mediastinal/bilateral  hilar adenopathy. Ground-glass opacities have increased in the lower lobes, left greater than right, likely infectious/inflammatory. Somewhat  improvement in ground-glass opacities in the upper lobe since prior study. Stable osseous metastatic disease. Coronary artery disease. Aortic Atherosclerosis (ICD10-I70.0). Electronically Signed   By: Rolm Baptise M.D.   On: 03/06/2021 00:11   CT Angio Chest PE W and/or Wo Contrast  Result Date: 02/27/2021 CLINICAL DATA:  PE suspected, widely metastatic lung cancer EXAM: CT ANGIOGRAPHY CHEST WITH CONTRAST TECHNIQUE: Multidetector CT imaging of the chest was performed using the standard protocol during bolus administration of intravenous contrast. Multiplanar CT image reconstructions and MIPs were obtained to evaluate the vascular anatomy. CONTRAST:  19mL OMNIPAQUE IOHEXOL 350 MG/ML SOLN COMPARISON:  02/15/2021 FINDINGS: Cardiovascular: Satisfactory opacification of the pulmonary arteries to the segmental level. No evidence of pulmonary embolism. Cardiomegaly. Left and right coronary artery calcifications and stents. No pericardial effusion. Mediastinum/Nodes: Redemonstrated, very extensive bulky bilateral hilar, mediastinal, supraclavicular, and left axillary lymphadenopathy. Thyroid gland, trachea, and esophagus demonstrate no significant findings. Lungs/Pleura: Diffuse bilateral bronchial wall thickening. Spiculated right upper lobe mass as seen on prior examination (series 7, image 42). Numerous redemonstrated small pulmonary nodules throughout the lungs. There is new, extensive ground-glass airspace opacity throughout the lungs, somewhat geographic in the upper lobes (series 7, image 33), although somewhat more nodular appearing in the lower lungs (series 7, image 58). No pleural effusion or pneumothorax. Upper Abdomen: No acute abnormality. Multiple low-attenuation lesions of liver, better assessed by prior dedicated of the abdomen. Musculoskeletal: No chest wall  abnormality. Multiple osseous metastatic lesions, most significantly a lytic lesion of the T9 vertebral body (series 9, image 58). Review of the MIP images confirms the above findings. IMPRESSION: 1. Negative examination for pulmonary embolism. 2. There is new, extensive ground-glass airspace opacity throughout the lungs, somewhat geographic in the upper lobes, although somewhat more nodular appearing in the lower lungs. Findings are nonspecific and infectious or inflammatory, differential considerations primarily include drug toxicity and atypical/viral infection. 3. Diffuse bilateral bronchial wall thickening, consistent with nonspecific infectious or inflammatory bronchitis. 4. Redemonstrated findings of advanced metastatic lung malignancy, better assessed by recent prior staging examination. 5. Coronary artery disease. Electronically Signed   By: Eddie Candle M.D.   On: 02/27/2021 10:49   CT Thoracic Spine Wo Contrast  Result Date: 02/18/2021 CLINICAL DATA:  Metastases on CT abdomen EXAM: CT THORACIC AND LUMBAR SPINE WITHOUT CONTRAST TECHNIQUE: Multidetector CT imaging of the thoracic and lumbar spine was performed without contrast. Multiplanar CT image reconstructions were also generated. COMPARISON:  None. FINDINGS: CT THORACIC SPINE FINDINGS Alignment: Preserved. Vertebrae: There is a small partially sclerotic lesion at the right aspect of the T5 vertebral body. A lytic lesion is present at the anterior aspect T9 likely with pathologic fracture but no substantial height loss. Ill-defined lucent lesions are present elsewhere. Paraspinal and other soft tissues: Better evaluated on prior dedicated imaging. Disc levels: Multilevel degenerative changes. No high-grade degenerative canal narrowing. CT LUMBAR SPINE FINDINGS Segmentation: 5 lumbar type vertebrae. Alignment: Dextrocurvature.  Grade 1 anterolisthesis at L4-L5 Vertebrae: Lucency and sclerosis at L2 with pathologic compression fracture resulting in  less than 50% loss of height at the superior endplate. Sclerotic lesion of the right sacrum. Paraspinal and other soft tissues: Better evaluated on prior dedicated imaging. Disc levels: Multilevel degenerative changes. Canal stenosis is greatest at L3-L4 and L4-L5. Foraminal narrowing is greatest on the right at L5-S1. IMPRESSION: Sclerotic and lytic metastatic lesions as already identified on the CT chest, abdomen, and pelvis. Pathologic L2 fracture with less than 50% loss of height. Probable pathologic fracture at T9  without height loss. No apparent significant epidural disease by CT. Electronically Signed   By: Macy Mis M.D.   On: 02/18/2021 10:53   CT Lumbar Spine Wo Contrast  Result Date: 02/18/2021 CLINICAL DATA:  Metastases on CT abdomen EXAM: CT THORACIC AND LUMBAR SPINE WITHOUT CONTRAST TECHNIQUE: Multidetector CT imaging of the thoracic and lumbar spine was performed without contrast. Multiplanar CT image reconstructions were also generated. COMPARISON:  None. FINDINGS: CT THORACIC SPINE FINDINGS Alignment: Preserved. Vertebrae: There is a small partially sclerotic lesion at the right aspect of the T5 vertebral body. A lytic lesion is present at the anterior aspect T9 likely with pathologic fracture but no substantial height loss. Ill-defined lucent lesions are present elsewhere. Paraspinal and other soft tissues: Better evaluated on prior dedicated imaging. Disc levels: Multilevel degenerative changes. No high-grade degenerative canal narrowing. CT LUMBAR SPINE FINDINGS Segmentation: 5 lumbar type vertebrae. Alignment: Dextrocurvature.  Grade 1 anterolisthesis at L4-L5 Vertebrae: Lucency and sclerosis at L2 with pathologic compression fracture resulting in less than 50% loss of height at the superior endplate. Sclerotic lesion of the right sacrum. Paraspinal and other soft tissues: Better evaluated on prior dedicated imaging. Disc levels: Multilevel degenerative changes. Canal stenosis is  greatest at L3-L4 and L4-L5. Foraminal narrowing is greatest on the right at L5-S1. IMPRESSION: Sclerotic and lytic metastatic lesions as already identified on the CT chest, abdomen, and pelvis. Pathologic L2 fracture with less than 50% loss of height. Probable pathologic fracture at T9 without height loss. No apparent significant epidural disease by CT. Electronically Signed   By: Macy Mis M.D.   On: 02/18/2021 10:53   MR Brain W and Wo Contrast  Result Date: 02/15/2021 CLINICAL DATA:  Abnormal head CT.  Skull base mass. EXAM: MRI HEAD WITHOUT AND WITH CONTRAST TECHNIQUE: Multiplanar, multiecho pulse sequences of the brain and surrounding structures were obtained without and with intravenous contrast. CONTRAST:  68mL GADAVIST GADOBUTROL 1 MMOL/ML IV SOLN COMPARISON:  CT head 02/15/2021 FINDINGS: Brain: Large destructive mass in the posterior skull base bilaterally left greater than right. This involves the occipital bone with extension into the left temporal bone. The occipital bone is significantly expanded due to tumor on the left with mass-effect on the left cerebellum. Soft tissue mass associated with left occipital lesion measures approximately 5.2 x 3.0 cm. Mild edema in the left cerebellum. There is infiltrating tumor in the left temporal bone which is more sizable than would be predicted from the CT. The soft tissue mass associated with the left occipital bone shows susceptibility which may be due to mineralization and/or bone fragments from bone destruction. Ventricle size normal. No midline shift. Moderate white matter changes likely due to chronic microvascular ischemia. No acute infarct. Chronic microhemorrhage in the left parietal lobe. Subtle enhancing lesions in the brain in the right frontal lobe measuring approximately 10 mm, and 4 mm best seen on coronal and sagittal postcontrast imaging. This is suspicious for metastatic disease. Vascular: Normal arterial flow voids. Normal venous  enhancement. No evidence of venous sinus thrombosis. Skull and upper cervical spine: Infiltrating destructive mass in the occipital bone bilaterally left greater than right with extension into the left temporal bone. Additional lesions in the frontal bone bilaterally and in the left parietal bone likely metastatic disease. Sinuses/Orbits: Mild mucosal edema paranasal sinuses. Negative orbit Other: None IMPRESSION: Destructive mass lesion in the occipital bone bilaterally left greater than right. There is associated soft tissue mass in the left occipital bone extending into the posterior fossa  with mass-effect and mild edema of the left cerebellum. This mass extends into the left temporal bone. Additional bone lesions are present in the frontal bones and left parietal bone. Subtle enhancing lesions right frontal lobe measuring 10 mm and 4 mm. Findings compatible with metastatic disease. Electronically Signed   By: Franchot Gallo M.D.   On: 02/15/2021 13:47   NM Bone Scan Whole Body  Result Date: 02/27/2021 CLINICAL DATA:  Metastatic disease evaluation, lung mass, abnormal CT EXAM: NUCLEAR MEDICINE WHOLE BODY BONE SCAN TECHNIQUE: Whole body anterior and posterior images were obtained approximately 3 hours after intravenous injection of radiopharmaceutical. RADIOPHARMACEUTICALS:  19.79 mCi Technetium-25m MDP IV COMPARISON:  None Correlation: CT chest abdomen pelvis 02/15/2021, CT thoracic and lumbar spine 02/18/2021, CT head 02/15/2021 FINDINGS: Multiple sites of abnormal tracer uptake are identified consistent with widespread osseous metastatic disease. These include calvaria greatest at occipital bone growth more on LEFT, thoracic and lumbar spine, RIGHT ribs, and pelvis. Dextroconvex thoracolumbar scoliosis. No definite abnormal tracer uptake within the humeri or femora. Expected urinary tract and soft tissue distribution of tracer. IMPRESSION: Scattered osseous metastases as above. Electronically Signed   By:  Lavonia Dana M.D.   On: 02/27/2021 18:17   US Renal  Result Date: 02/18/2021 CLINICAL DATA:  Back pain. Right renal pelvis fullness on right upper quadrant ultrasound performed 1 day prior EXAM: RENAL / URINARY TRACT ULTRASOUND COMPLETE COMPARISON:  02/17/2021 abdominal sonogram. FINDINGS: Right Kidney: Renal measurements: 10.3 x 4.2 x 4.2 cm = volume: 95 mL. Normal parenchymal echogenicity and thickness. Mild right hydronephrosis. Possible 5 mm right ureteropelvic junction stone. Nonobstructing 3 mm lower right renal stone. No renal masses. Left Kidney: Renal measurements: 10.0 x 4.8 x 4.2 cm = volume: 107 mL. Normal parenchymal echogenicity and thickness. No left hydronephrosis. No renal masses. Probable nonobstructing 7 mm upper left renal stone. Bladder: Appears normal for degree of bladder distention. Bilateral ureteral jets seen in the bladder. Other: None. IMPRESSION: 1. Mild right hydronephrosis. Possible 5 mm right UPJ stone. Nonobstructing 3 mm lower right renal stone. Suggest unenhanced CT abdomen/pelvis for further evaluation. 2. Probable nonobstructing upper left renal stone. Electronically Signed   By: Ilona Sorrel M.D.   On: 02/18/2021 12:02   CT CHEST ABDOMEN PELVIS W CONTRAST  Result Date: 02/15/2021 CLINICAL DATA:  Altered mental status. 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 EXAM: CT CHEST, ABDOMEN, AND PELVIS WITH CONTRAST TECHNIQUE: Multidetector CT imaging of the chest, abdomen and pelvis was performed following the standard protocol during bolus administration of intravenous contrast. CONTRAST:  75 mL OMNIPAQUE IOHEXOL 300 MG/ML  SOLN COMPARISON:  None. FINDINGS: CT CHEST FINDINGS Cardiovascular: No significant vascular findings. Normal heart size. No pericardial effusion. Mediastinum/Nodes: The patient has extensive lymphadenopathy. A 2 cm left axillary node is seen on image 11 of series 2; a second left axillary node on image 14 measures 1.4 cm. A 1.1 cm  subpectoral node is seen on the left on image 15 of series 2. A 1.4 cm left supraclavicular node is seen on image 9. Right paratracheal node on image 15 measures 1.9 cm. Right precarinal nodal mass on image 25 measures 2.6 cm and there is a right hilar nodal mass measuring 4.2 x 3.1 cm on image 30. The esophagus and thyroid are negative. Lungs/Pleura: Small right pleural effusion. A spiculated nodule in the right middle lobe measures 2.6 cm transverse by 2.7 cm AP on image 77, series 4. Scattered pulmonary nodules include a 0.5  cm nodule in the superior segment of the left lower lobe on image 80, 0.4 cm left upper lobe nodule on image 76 and 2 punctate nodules on image 83. Additional smaller nodules are seen scattered through both lungs. The lungs are emphysematous. No pleural effusion. Musculoskeletal: A 0.9 cm lytic lesion is seen in T5, lytic lesions are seen in T7 and T9, larger in T9. There is also a small lytic lesion in T10. CT ABDOMEN PELVIS FINDINGS Hepatobiliary: Metastatic lesions are seen in the left hepatic lobe measuring 1.7 cm on image 57, series 2 and near the dome of the liver on image 50 of series 2 where a 1.1 cm lesion is identified. A second lesion near the dome of the liver in the right hepatic lobe on image 50 measures 0.7 cm. There is mild intra and extrahepatic biliary ductal dilatation likely related to prior cholecystectomy. Pancreas: Unremarkable. No pancreatic ductal dilatation or surrounding inflammatory changes. Spleen: Normal in size without focal abnormality. Adrenals/Urinary Tract: Adrenal glands are unremarkable. Kidneys are normal, without renal calculi, focal lesion, or hydronephrosis. Bladder is unremarkable. Stomach/Bowel: Stomach is within normal limits. Appendix appears normal. No evidence of bowel wall thickening, distention, or inflammatory changes. Moderately large colonic stool burden throughout noted. Vascular/Lymphatic: Aortic atherosclerosis. No aneurysm. No pathologic  lymphadenopathy by CT size criteria. Reproductive: Status post hysterectomy. No adnexal masses. Other: None. Musculoskeletal: Mottled appearance of the L2 vertebral body is consistent with metastatic disease. There is a mild pathologic superior endplate compression fracture of L2 with vertebral body height loss centrally of approximately 30%. IMPRESSION: Findings consistent with extensive metastatic carcinoma likely emanating from a 2.7 cm right middle lobe pulmonary nodule. Metastases include extensive lymphadenopathy in the chest,, pulmonary nodule bone lesions and liver lesions. Mild pathologic fracture of L2 with vertebral body height loss centrally of up to approximately 30% is again noted. Aortic Atherosclerosis (ICD10-I70.0) and Emphysema (ICD10-J43.9). Electronically Signed   By: Inge Rise M.D.   On: 02/15/2021 14:25   DG Chest Portable 1 View  Result Date: 03/15/2021 CLINICAL DATA:  Shortness of breath EXAM: PORTABLE CHEST 1 VIEW COMPARISON:  03/07/2021 FINDINGS: Cardiac shadow is stable. Patchy airspace opacities are again identified slightly improved when compared with the prior exam. No new focal infiltrate is seen. No bony abnormality is noted. IMPRESSION: Slight improvement in previously seen airspace opacities bilaterally. Electronically Signed   By: Inez Catalina M.D.   On: 03/15/2021 11:38   DG Chest Portable 1 View  Result Date: 03/07/2021 CLINICAL DATA:  Shortness of breath EXAM: PORTABLE CHEST 1 VIEW COMPARISON:  03/05/2021 FINDINGS: Patchy bilateral airspace disease throughout the lungs, slightly worsened since prior study. Nodular areas within the lungs, stable since prior study. Heart is normal size. No effusions or acute bony abnormality. IMPRESSION: Worsening patchy bilateral airspace disease, likely worsening pneumonia. Scattered nodules unchanged. Electronically Signed   By: Rolm Baptise M.D.   On: 03/07/2021 21:41   DG Chest Port 1 View  Result Date: 02/27/2021 CLINICAL  DATA:  Worsening shortness of breath since this morning. Recent diagnosis of lung cancer. EXAM: PORTABLE CHEST 1 VIEW COMPARISON:  Radiograph earlier today.  CT earlier today. FINDINGS: Stable heart size and mediastinal contours. Right hilar prominence and thickening of the lower paratracheal stripe related to known adenopathy. Unchanged right mid lung pulmonary nodule. Mild patchy bilateral suprahilar opacities corresponding to ground-glass opacity on CT earlier today. No significant change in the interim. No pneumothorax or large pleural effusion. Retained excreted IV contrast in the  right renal collecting system with right hydronephrosis, partially included in the upper abdomen. IMPRESSION: 1. Stable radiographic appearance of the chest from earlier today. Similar patchy suprahilar opacities corresponding to ground-glass opacity on CT. 2. Right mid lung pulmonary nodule and thoracic adenopathy. 3. Partially included in the upper abdomen is retained excreted contrast in the right renal collecting system with right hydronephrosis. Right hydronephrosis was seen on renal ultrasound 02/18/2021. Electronically Signed   By: Keith Rake M.D.   On: 02/27/2021 15:04   DG Chest Portable 1 View  Result Date: 02/27/2021 CLINICAL DATA:  60 year old female with shortness of breath and cough. Metastatic lung cancer. EXAM: PORTABLE CHEST 1 VIEW COMPARISON:  02/15/2021 and prior studies FINDINGS: New hazy opacities within the central/upper lungs noted. RIGHT middle lobe mass and small scattered pulmonary nodules bilaterally are again identified. No pleural effusion or pneumothorax. No acute bony abnormalities are identified. IMPRESSION: New hazy opacities within the central/upper lungs which may represent edema or infection. Unchanged RIGHT middle lobe mass and bilateral pulmonary nodules compatible with known malignancy/metastases. Electronically Signed   By: Margarette Canada M.D.   On: 02/27/2021 08:49   [place] CORE BIOPSY  (LYMPH NODES)  Result Date: 02/21/2021 INDICATION: Multiple enlarged lymph nodes, right middle lobe lung mass, liver lesions and bone lesions. EXAM: ULTRASOUND GUIDED CORE BIOPSY OF LEFT SUPRACLAVICULAR LYMPH NODE MEDICATIONS: None. ANESTHESIA/SEDATION: Versed 2.0 mg IV Moderate Sedation Time:  12 minutes. The patient was continuously monitored during the procedure by the interventional radiology nurse under my direct supervision. PROCEDURE: The procedure, risks, benefits, and alternatives were explained to the patient. Questions regarding the procedure were encouraged and answered. The patient understands and consents to the procedure. A time-out was performed prior to initiating the procedure. The left neck was prepped with chlorhexidine in a sterile fashion, and a sterile drape was applied covering the operative field. A sterile gown and sterile gloves were used for the procedure. Local anesthesia was provided with 1% Lidocaine. After localizing an enlarged left supraclavicular lymph node by ultrasound, multiple 18 gauge core biopsy samples were obtained. Core biopsy samples were submitted in formalin as well as on saline soaked Telfa gauze. Additional ultrasound was performed. COMPLICATIONS: None immediate. FINDINGS: An enlarged left supraclavicular lymph node measures approximately 3.0 x 1.8 x 2.9 cm. Solid core biopsy samples were obtained. IMPRESSION: Ultrasound-guided core biopsy performed of an enlarged left supraclavicular lymph node measuring 3 cm in greatest dimensions. Electronically Signed   By: Aletta Edouard M.D.   On: 02/21/2021 15:31   CT Maxillofacial Wo Contrast  Result Date: 02/15/2021 CLINICAL DATA:  Headache with left-sided face/neck/head pain. On antibiotics for reported laryngitis with bilateral ear pain. EXAM: CT HEAD WITHOUT CONTRAST CT MAXILLOFACIAL WITHOUT CONTRAST TECHNIQUE: Multidetector CT imaging of the head and maxillofacial structures were performed using the standard  protocol without intravenous contrast. Multiplanar CT image reconstructions of the maxillofacial structures were also generated. COMPARISON:  None. FINDINGS: CT HEAD FINDINGS Brain: The calvarial/skull base mass lesion is described below. Spur this process exerts mass effect on the left cerebellum with suspected narrowing of the fourth ventricle. No evidence of hydrocephalus. No evidence of acute large vascular territory infarct. No acute hemorrhage. Mild to moderate scattered white matter hypodensities, which are nonspecific but most likely relate to chronic microvascular ischemic disease. No midline shift. Vascular: No hyperdense vessel identified. Calcific atherosclerosis. Skull/skull base: There is aggressive soft tissue mass lesion involving the left occipital calvarium measuring up to approximately 5.5 x 3.1 by 3.3 cm (transverse  by AP by craniocaudal) with bony destruction and expansion and extension inferiorly to involve the left temporal bone and skull base. There is mass effect on the inferior left cerebellum and possible involvement of the subjacent dural venous sinuses. Small left mastoid effusion. CT MAXILLOFACIAL FINDINGS Osseous: No fracture or mandibular dislocation. No destructive process in the face. Orbits: Negative. No traumatic or inflammatory finding. Sinuses: Small right maxillary sinus retention cyst. Otherwise, sinuses are clear. Soft tissues: Negative. 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 Resulting mass effect on the left cerebellum and possible involvement of the subjacent dural venous sinuses. 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 Osteomyelitis is a differential consideration. Recommend MRI brain with and without contrast to further evaluate. Findings and recommendations discussed with  Ashok Cordia, PA via telephone at 11:20 a.m. Electronically Signed   By: Margaretha Sheffield MD   On: 02/15/2021 11:29   US Abdomen Limited RUQ (LIVER/GB)  Result Date: 02/17/2021 CLINICAL DATA:  Nausea and vomiting for 1 day. EXAM: ULTRASOUND ABDOMEN LIMITED RIGHT UPPER QUADRANT COMPARISON:  CT chest, abdomen and pelvis 02/15/2021. FINDINGS: Gallbladder: Removed. Common bile duct: Diameter: 0.8 cm. Liver: Three hypoechoic liver lesions measuring up to 1.8 cm are identified are identified and correlate with metastatic deposit seen on prior CT. Mild intrahepatic biliary ductal dilatation seen on CT is also noted. Portal vein is patent on color Doppler imaging with normal direction of blood flow towards the liver. Other: There is fullness of the right renal pelvis which is new since the patient's CT scan yesterday. IMPRESSION: Three hypoechoic liver lesions measuring up to 1.8 cm correlate with metastatic deposit seen on CT. Status post cholecystectomy. Mild intra and extrahepatic biliary ductal dilatation is likely related to cholecystectomy. No obstructing lesion is identified. Fullness of the right renal pelvis is new since yesterday's examination. This could be incidental. Correlation with dedicated renal ultrasound could be used for further evaluation if indicated. Electronically Signed   By: Inge Rise M.D.   On: 02/17/2021 12:53    Assessment and plan-   Sepsis/acute respiratory failure/Possible obstructive pneumonia or aspiration pneumonia Patient has been on steroids due to brain metastasis, steroid may have contributed to the marked leukocytosis. I agree with empiric antibiotics- Vanomycine discontinued today on Cefepime  DuoNeb nebulizer, Dulera inhaler   Altered mental status, probably due to toxic encephalopathy due to sepsis versus opiate overdose Mental status has improved. Brain metastasis, continue Dexamethasone 4mg  BID  Stage IV lung adenocarcinoma, extensive metastasis S/p  cycle 1 dose reduced carboplatin/Alimta, 2 days ago. Poor prognosis given the extensive metastasis, multiple medical problems, poor insight,  recurrent hospitalization,   Consult palliative care service for goals of care discussion and CODE STATUS discussion.  It is challenging to manage her neoplasm related pain given her history of substance abuse and the likelihood of mental status change secondary to opiate overdose. I had a lengthy discussion with her today and I recommend patient to consider DNR/DNI.  Patient prefers full code at this point.  #Elevated BNP, check echocardiogram.   Thank you for allowing me to participate in the care of this patient.   Earlie Server, MD, PhD Hematology Oncology [facility] at [facility] Pager- [phone] 03/16/2021

## 2021-03-16 NOTE — Consult Note (Addendum)
[facility]  [phone] [phone]   Name: Brenda Middleton Date: 03/16/2021 MRN: [id number]  DOB: 06/20/1961  Patient Care Team: Remi Haggard, FNP as PCP - General (Family Medicine) Remi Haggard, FNP (Family Medicine) Christene Lye, MD (General Surgery) Telford Nab, RN as Oncology Nurse Navigator    REASON FOR CONSULTATION: Brenda Middleton is a 59 y.o. female with multiple medical problems including anxiety, nicotine dependence, hypertension, depression, and chronic low back pain.  Patient was recently diagnosed with stage IV non-small cell lung cancer.  MRI of the brain revealed destructive mass in the occipital bone bilaterally extending into the posterior fossa with mass-effect to the left cerebellum.  CT of the chest abdomen and pelvis revealed a large mass in the right middle lobe with widespread metastases involving lymphadenopathy in the chest, multiple pulmonary nodules, bone lesions, and liver lesions.  She was found to have a pathologic fracture of L2.  Patient was recently hospitalized 02/18/2021 -02/21/2021 with intractable low back pain.  Pain improved with dose escalation of opioids.  She was readmitted 02/27/2021 -03/08/2021 with HCAP.  Patient is now readmitted 03/15/2021 with AMS presumably due to overuse of pain medications. Palliative care was consulted help address goals and manage ongoing symptoms.  SOCIAL HISTORY:     reports that she has quit smoking. Her smoking use included cigarettes. She has a 24.50 pack-year smoking history. She has never used smokeless tobacco. She reports current alcohol use. She reports that she does not use drugs.  Patient is unmarried.  She has no children.  She lives at home with her sister.  She has another sister in [place].  Patient is disabled.  She has a history of substance use, primarily crack cocaine but reports sobriety over the past 6 years.  ADVANCE  DIRECTIVES:  None on file  CODE STATUS: DNR  PAST MEDICAL HISTORY: Past Medical History:  Diagnosis Date  . Anxiety   . Benign neoplasm of cervix uteri   . Chronic hepatitis C without mention of hepatic coma   . Chronic low back pain 08/26/2014  . Constipation   . Depressive disorder   . Displacement of cervical intervertebral disc   . Hypertensive disorder   . Iron deficiency anemia due to chronic blood loss 09/12/2019  . Palpitations   . Prolapsed cervical intervertebral disc     PAST SURGICAL HISTORY:  Past Surgical History:  Procedure Laterality Date  . CONIZATION CERVIX    . DILATION AND CURETTAGE OF UTERUS    . HAND SURGERY  2008,2015   Carpel Tunnel  . TONSILLECTOMY    . VULVECTOMY      HEMATOLOGY/ONCOLOGY HISTORY:  Oncology History  Malignant neoplasm of lung ([facility])  02/27/2021 Initial Diagnosis   Primary malignant neoplasm of lung metastatic to other site [facility])   03/03/2021 Cancer Staging   Staging form: Lung, AJCC 8th Edition - Clinical: Stage IVB (cT4, cN3, cM1c) - Signed by Earlie Server, MD on 03/03/2021   03/14/2021 -  Chemotherapy    Patient is on Treatment Plan: LUNG NSCLC PEMETREXED (ALIMTA) / CARBOPLATIN Q21D X 1 CYCLES        ALLERGIES:  is allergic to penicillin g, penicillin v, and penicillins.  MEDICATIONS:  Current Facility-Administered Medications  Medication Dose Route Frequency Provider Last Rate Last Admin  . acetaminophen (TYLENOL) tablet 650 mg  650 mg Oral Q6H PRN Agbata, Tochukwu, MD       Or  .  acetaminophen (TYLENOL) suppository 650 mg  650 mg Rectal Q6H PRN Agbata, Tochukwu, MD      . ceFEPIme (MAXIPIME) 2 g in sodium chloride 0.9 % 100 mL IVPB  2 g Intravenous Q8H Dallie Piles, RPH 200 mL/hr at 03/16/21 1700 2 g at 03/16/21 1700  . Chlorhexidine Gluconate Cloth 2 % PADS 6 each  6 each Topical Daily Agbata, Tochukwu, MD   6 each at 03/15/21 1550  . clonazePAM (KLONOPIN) tablet 0.5 mg  0.5 mg Oral BID PRN Domenic Polite, MD   0.5 mg  at 03/16/21 1441  . dexamethasone (DECADRON) tablet 4 mg  4 mg Oral BID Agbata, Tochukwu, MD   4 mg at 03/16/21 1156  . feeding supplement (BOOST / RESOURCE BREEZE) liquid 1 Container  1 Container Oral TID BM Domenic Polite, MD      . guaiFENesin [facility]) 12 hr tablet 1,200 mg  1,200 mg Oral BID Agbata, Tochukwu, MD   1,200 mg at 03/16/21 1045  . ipratropium-albuterol (DUONEB) 0.5-2.5 (3) MG/3ML nebulizer solution 3 mL  3 mL Nebulization Q6H PRN Agbata, Tochukwu, MD   3 mL at 03/15/21 1606  . magic mouthwash  5 mL Oral TID PRN Agbata, Tochukwu, MD      . MEDLINE mouth rinse  15 mL Mouth Rinse BID Agbata, Tochukwu, MD   15 mL at 03/15/21 2215  . mometasone-formoterol (DULERA) 100-5 MCG/ACT inhaler 2 puff  2 puff Inhalation BID Agbata, Tochukwu, MD   2 puff at 03/16/21 1157  . multivitamin with minerals tablet 1 tablet  1 tablet Oral Daily Domenic Polite, MD      . nicotine (NICODERM CQ - dosed in mg/24 hours) patch 14 mg  14 mg Transdermal Daily Agbata, Tochukwu, MD   14 mg at 03/16/21 1045  . ondansetron (ZOFRAN) tablet 4 mg  4 mg Oral Q6H PRN Agbata, Tochukwu, MD       Or  . ondansetron (ZOFRAN) injection 4 mg  4 mg Intravenous Q6H PRN Agbata, Tochukwu, MD      . oxyCODONE-acetaminophen (PERCOCET/ROXICET) 5-325 MG per tablet 1-2 tablet  1-2 tablet Oral Q6H PRN Domenic Polite, MD      . pantoprazole (PROTONIX) injection 40 mg  40 mg Intravenous Q24H Agbata, Tochukwu, MD   40 mg at 03/15/21 2100  . potassium chloride SA (KLOR-CON) CR tablet 40 mEq  40 mEq Oral Daily Agbata, Tochukwu, MD   40 mEq at 03/16/21 1045    VITAL SIGNS: BP (!) 160/98   Pulse (!) 115   Temp 99 F (37.2 C) (Oral)   Resp 15   Ht _0  (1.676 m)   Wt 111 lb 12.4 oz (50.7 kg)   SpO2 95%   BMI 18.04 kg/m  Filed Weights   03/15/21 1119  Weight: 111 lb 12.4 oz (50.7 kg)    Estimated body mass index is 18.04 kg/m as calculated from the following:   Height as of this encounter: _1  (1.676 m).   Weight as of  this encounter: 111 lb 12.4 oz (50.7 kg).  LABS: CBC:    Component Value Date/Time   WBC 16.5 (H) 03/16/2021 0314   HGB 7.6 (L) 03/16/2021 0314   HGB 13.6 11/04/2013 1650   HCT 23.6 (L) 03/16/2021 0314   HCT 40.2 11/04/2013 1650   PLT 99 (L) 03/16/2021 0314   PLT 136 (L) 11/04/2013 1650   MCV 84.3 03/16/2021 0314   MCV 90 11/04/2013 1650   NEUTROABS 16.9 (H) 03/15/2021 1109  NEUTROABS 3.5 10/12/2014 1019   LYMPHSABS 0.6 (L) 03/15/2021 1109   LYMPHSABS 3.2 (H) 10/12/2014 1019   MONOABS 0.5 03/15/2021 1109   EOSABS 0.0 03/15/2021 1109   EOSABS 0.4 10/12/2014 1019   BASOSABS 0.0 03/15/2021 1109   BASOSABS 0.0 10/12/2014 1019   Comprehensive Metabolic Panel:    Component Value Date/Time   NA 140 03/16/2021 0314   NA 142 11/05/2013 0408   K 4.0 03/16/2021 0314   K 3.5 11/05/2013 0408   CL 109 03/16/2021 0314   CL 113 (H) 11/05/2013 0408   CO2 24 03/16/2021 0314   CO2 22 11/05/2013 0408   BUN 26 (H) 03/16/2021 0314   BUN 10 11/05/2013 0408   CREATININE 0.72 03/16/2021 0314   CREATININE 0.61 11/05/2013 0408   GLUCOSE 143 (H) 03/16/2021 0314   GLUCOSE 84 11/05/2013 0408   CALCIUM 7.7 (L) 03/16/2021 0314   CALCIUM 8.3 (L) 11/05/2013 0408   AST 27 03/15/2021 1109   AST 34 11/04/2013 1650   ALT 27 03/15/2021 1109   ALT 43 11/04/2013 1650   ALKPHOS 278 (H) 03/15/2021 1109   ALKPHOS 132 (H) 11/04/2013 1650   BILITOT 0.4 03/15/2021 1109   BILITOT 0.2 11/04/2013 1650   PROT 6.1 (L) 03/15/2021 1109   PROT 6.6 10/12/2014 1019   PROT 7.3 11/04/2013 1650   ALBUMIN 2.6 (L) 03/15/2021 1109   ALBUMIN 4.1 10/12/2014 1019   ALBUMIN 3.4 11/04/2013 1650    RADIOGRAPHIC STUDIES: DG Chest 2 View  Result Date: 03/05/2021 CLINICAL DATA:  Shortness of breath EXAM: CHEST - 2 VIEW COMPARISON:  02/27/2021 FINDINGS: Heart is normal size. Right upper lobe mass again noted as seen on CT. Patchy bilateral airspace disease again noted, similar to prior study. No effusions or acute bony  abnormality. IMPRESSION: Patchy bilateral airspace disease again noted, not significantly changed. Right upper lobe mass again seen. Electronically Signed   By: Rolm Baptise M.D.   On: 03/05/2021 21:42   DG Chest 2 View  Result Date: 02/15/2021 CLINICAL DATA:  Weight loss and cough EXAM: CHEST - 2 VIEW COMPARISON:  None. FINDINGS: The heart size and mediastinal contours are within normal limits. Rounded patchy nodular opacity seen within the right upper lobe and right mid lung. The left lung is clear. No acute osseous abnormality. IMPRESSION: Rounded patchy airspace opacities in the right mid lung which may be due to infectious etiology, however cannot exclude metastatic disease. Would recommend CT chest with contrast for further evaluation. Electronically Signed   By: Prudencio Pair M.D.   On: 02/15/2021 12:16   CT Head Wo Contrast  Result Date: 02/15/2021 CLINICAL DATA:  Headache with left-sided face/neck/head pain. On antibiotics for reported laryngitis with bilateral ear pain. EXAM: CT HEAD WITHOUT CONTRAST CT MAXILLOFACIAL WITHOUT CONTRAST TECHNIQUE: Multidetector CT imaging of the head and maxillofacial structures were performed using the standard protocol without intravenous contrast. Multiplanar CT image reconstructions of the maxillofacial structures were also generated. COMPARISON:  None. FINDINGS: CT HEAD FINDINGS Brain: The calvarial/skull base mass lesion is described below. Spur this process exerts mass effect on the left cerebellum with suspected narrowing of the fourth ventricle. No evidence of hydrocephalus. No evidence of acute large vascular territory infarct. No acute hemorrhage. Mild to moderate scattered white matter hypodensities, which are nonspecific but most likely relate to chronic microvascular ischemic disease. No midline shift. Vascular: No hyperdense vessel identified. Calcific atherosclerosis. Skull/skull base: There is aggressive soft tissue mass lesion involving the left  occipital calvarium measuring  up to approximately 5.5 x 3.1 by 3.3 cm (transverse by AP by craniocaudal) with bony destruction and expansion and extension inferiorly to involve the left temporal bone and skull base. There is mass effect on the inferior left cerebellum and possible involvement of the subjacent dural venous sinuses. Small left mastoid effusion. CT MAXILLOFACIAL FINDINGS Osseous: No fracture or mandibular dislocation. No destructive process in the face. Orbits: Negative. No traumatic or inflammatory finding. Sinuses: Small right maxillary sinus retention cyst. Otherwise, sinuses are clear. Soft tissues: Negative. 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 Resulting mass effect on the left cerebellum and possible involvement of the subjacent dural venous sinuses. 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 Osteomyelitis is a differential consideration. Recommend MRI brain with and without contrast to further evaluate. Findings and recommendations discussed with Ashok Cordia, PA via telephone at 11:20 a.m. Electronically Signed   By: Margaretha Sheffield MD   On: 02/15/2021 11:29   CT CHEST W CONTRAST  Result Date: 03/06/2021 CLINICAL DATA:  Respiratory failure, shortness of breath EXAM: CT CHEST WITH CONTRAST TECHNIQUE: Multidetector CT imaging of the chest was performed during intravenous contrast administration. CONTRAST:  60m OMNIPAQUE IOHEXOL 300 MG/ML  SOLN COMPARISON:  02/27/2021.  Chest x-ray 03/05/2021 FINDINGS: Cardiovascular: Heart is normal size. Coronary artery and aortic calcifications. No aneurysm. Mediastinum/Nodes: Bulky mediastinal and bilateral hilar adenopathy again noted, unchanged. Trachea is patent. Thyroid unremarkable. Lungs/Pleura: Ground-glass airspace opacities in the lower lobes have  worsened since prior study, particularly in the left lower lobe. Ground-glass opacities in the upper lobes somewhat improved since prior study. Numerous bilateral pulmonary nodules are stable. Right upper lobe mass again noted and stable. No effusions. Upper Abdomen: Imaging into the upper abdomen demonstrates no acute findings. Musculoskeletal: Chest wall soft tissues are unremarkable. Multiple osseous metastases again noted, unchanged. IMPRESSION: Redemonstrated is extensive metastatic disease in the chest with numerous pulmonary nodules, dominant right upper lobe mass, and bulky mediastinal/bilateral hilar adenopathy. Ground-glass opacities have increased in the lower lobes, left greater than right, likely infectious/inflammatory. Somewhat improvement in ground-glass opacities in the upper lobe since prior study. Stable osseous metastatic disease. Coronary artery disease. Aortic Atherosclerosis (ICD10-I70.0). Electronically Signed   By: KRolm BaptiseM.D.   On: 03/06/2021 00:11   CT Angio Chest PE W and/or Wo Contrast  Result Date: 02/27/2021 CLINICAL DATA:  PE suspected, widely metastatic lung cancer EXAM: CT ANGIOGRAPHY CHEST WITH CONTRAST TECHNIQUE: Multidetector CT imaging of the chest was performed using the standard protocol during bolus administration of intravenous contrast. Multiplanar CT image reconstructions and MIPs were obtained to evaluate the vascular anatomy. CONTRAST:  752mOMNIPAQUE IOHEXOL 350 MG/ML SOLN COMPARISON:  02/15/2021 FINDINGS: Cardiovascular: Satisfactory opacification of the pulmonary arteries to the segmental level. No evidence of pulmonary embolism. Cardiomegaly. Left and right coronary artery calcifications and stents. No pericardial effusion. Mediastinum/Nodes: Redemonstrated, very extensive bulky bilateral hilar, mediastinal, supraclavicular, and left axillary lymphadenopathy. Thyroid gland, trachea, and esophagus demonstrate no significant findings. Lungs/Pleura: Diffuse  bilateral bronchial wall thickening. Spiculated right upper lobe mass as seen on prior examination (series 7, image 42). Numerous redemonstrated small pulmonary nodules throughout the lungs. There is new, extensive ground-glass airspace opacity throughout the lungs, somewhat geographic in the upper lobes (series 7, image 33), although somewhat more nodular appearing in the lower lungs (series 7, image 58). No pleural effusion  or pneumothorax. Upper Abdomen: No acute abnormality. Multiple low-attenuation lesions of liver, better assessed by prior dedicated of the abdomen. Musculoskeletal: No chest wall abnormality. Multiple osseous metastatic lesions, most significantly a lytic lesion of the T9 vertebral body (series 9, image 58). Review of the MIP images confirms the above findings. IMPRESSION: 1. Negative examination for pulmonary embolism. 2. There is new, extensive ground-glass airspace opacity throughout the lungs, somewhat geographic in the upper lobes, although somewhat more nodular appearing in the lower lungs. Findings are nonspecific and infectious or inflammatory, differential considerations primarily include drug toxicity and atypical/viral infection. 3. Diffuse bilateral bronchial wall thickening, consistent with nonspecific infectious or inflammatory bronchitis. 4. Redemonstrated findings of advanced metastatic lung malignancy, better assessed by recent prior staging examination. 5. Coronary artery disease. Electronically Signed   By: Eddie Candle M.D.   On: 02/27/2021 10:49   CT Thoracic Spine Wo Contrast  Result Date: 02/18/2021 CLINICAL DATA:  Metastases on CT abdomen EXAM: CT THORACIC AND LUMBAR SPINE WITHOUT CONTRAST TECHNIQUE: Multidetector CT imaging of the thoracic and lumbar spine was performed without contrast. Multiplanar CT image reconstructions were also generated. COMPARISON:  None. FINDINGS: CT THORACIC SPINE FINDINGS Alignment: Preserved. Vertebrae: There is a small partially  sclerotic lesion at the right aspect of the T5 vertebral body. A lytic lesion is present at the anterior aspect T9 likely with pathologic fracture but no substantial height loss. Ill-defined lucent lesions are present elsewhere. Paraspinal and other soft tissues: Better evaluated on prior dedicated imaging. Disc levels: Multilevel degenerative changes. No high-grade degenerative canal narrowing. CT LUMBAR SPINE FINDINGS Segmentation: 5 lumbar type vertebrae. Alignment: Dextrocurvature.  Grade 1 anterolisthesis at L4-L5 Vertebrae: Lucency and sclerosis at L2 with pathologic compression fracture resulting in less than 50% loss of height at the superior endplate. Sclerotic lesion of the right sacrum. Paraspinal and other soft tissues: Better evaluated on prior dedicated imaging. Disc levels: Multilevel degenerative changes. Canal stenosis is greatest at L3-L4 and L4-L5. Foraminal narrowing is greatest on the right at L5-S1. IMPRESSION: Sclerotic and lytic metastatic lesions as already identified on the CT chest, abdomen, and pelvis. Pathologic L2 fracture with less than 50% loss of height. Probable pathologic fracture at T9 without height loss. No apparent significant epidural disease by CT. Electronically Signed   By: Macy Mis M.D.   On: 02/18/2021 10:53   CT Lumbar Spine Wo Contrast  Result Date: 02/18/2021 CLINICAL DATA:  Metastases on CT abdomen EXAM: CT THORACIC AND LUMBAR SPINE WITHOUT CONTRAST TECHNIQUE: Multidetector CT imaging of the thoracic and lumbar spine was performed without contrast. Multiplanar CT image reconstructions were also generated. COMPARISON:  None. FINDINGS: CT THORACIC SPINE FINDINGS Alignment: Preserved. Vertebrae: There is a small partially sclerotic lesion at the right aspect of the T5 vertebral body. A lytic lesion is present at the anterior aspect T9 likely with pathologic fracture but no substantial height loss. Ill-defined lucent lesions are present elsewhere. Paraspinal and  other soft tissues: Better evaluated on prior dedicated imaging. Disc levels: Multilevel degenerative changes. No high-grade degenerative canal narrowing. CT LUMBAR SPINE FINDINGS Segmentation: 5 lumbar type vertebrae. Alignment: Dextrocurvature.  Grade 1 anterolisthesis at L4-L5 Vertebrae: Lucency and sclerosis at L2 with pathologic compression fracture resulting in less than 50% loss of height at the superior endplate. Sclerotic lesion of the right sacrum. Paraspinal and other soft tissues: Better evaluated on prior dedicated imaging. Disc levels: Multilevel degenerative changes. Canal stenosis is greatest at L3-L4 and L4-L5. Foraminal narrowing is greatest on the right at L5-S1.  IMPRESSION: Sclerotic and lytic metastatic lesions as already identified on the CT chest, abdomen, and pelvis. Pathologic L2 fracture with less than 50% loss of height. Probable pathologic fracture at T9 without height loss. No apparent significant epidural disease by CT. Electronically Signed   By: Macy Mis M.D.   On: 02/18/2021 10:53   MR Brain W and Wo Contrast  Result Date: 02/15/2021 CLINICAL DATA:  Abnormal head CT.  Skull base mass. EXAM: MRI HEAD WITHOUT AND WITH CONTRAST TECHNIQUE: Multiplanar, multiecho pulse sequences of the brain and surrounding structures were obtained without and with intravenous contrast. CONTRAST:  47mL GADAVIST GADOBUTROL 1 MMOL/ML IV SOLN COMPARISON:  CT head 02/15/2021 FINDINGS: Brain: Large destructive mass in the posterior skull base bilaterally left greater than right. This involves the occipital bone with extension into the left temporal bone. The occipital bone is significantly expanded due to tumor on the left with mass-effect on the left cerebellum. Soft tissue mass associated with left occipital lesion measures approximately 5.2 x 3.0 cm. Mild edema in the left cerebellum. There is infiltrating tumor in the left temporal bone which is more sizable than would be predicted from the CT.  The soft tissue mass associated with the left occipital bone shows susceptibility which may be due to mineralization and/or bone fragments from bone destruction. Ventricle size normal. No midline shift. Moderate white matter changes likely due to chronic microvascular ischemia. No acute infarct. Chronic microhemorrhage in the left parietal lobe. Subtle enhancing lesions in the brain in the right frontal lobe measuring approximately 10 mm, and 4 mm best seen on coronal and sagittal postcontrast imaging. This is suspicious for metastatic disease. Vascular: Normal arterial flow voids. Normal venous enhancement. No evidence of venous sinus thrombosis. Skull and upper cervical spine: Infiltrating destructive mass in the occipital bone bilaterally left greater than right with extension into the left temporal bone. Additional lesions in the frontal bone bilaterally and in the left parietal bone likely metastatic disease. Sinuses/Orbits: Mild mucosal edema paranasal sinuses. Negative orbit Other: None IMPRESSION: Destructive mass lesion in the occipital bone bilaterally left greater than right. There is associated soft tissue mass in the left occipital bone extending into the posterior fossa with mass-effect and mild edema of the left cerebellum. This mass extends into the left temporal bone. Additional bone lesions are present in the frontal bones and left parietal bone. Subtle enhancing lesions right frontal lobe measuring 10 mm and 4 mm. Findings compatible with metastatic disease. Electronically Signed   By: Franchot Gallo M.D.   On: 02/15/2021 13:47   NM Bone Scan Whole Body  Result Date: 02/27/2021 CLINICAL DATA:  Metastatic disease evaluation, lung mass, abnormal CT EXAM: NUCLEAR MEDICINE WHOLE BODY BONE SCAN TECHNIQUE: Whole body anterior and posterior images were obtained approximately 3 hours after intravenous injection of radiopharmaceutical. RADIOPHARMACEUTICALS:  19.79 mCi Technetium-109m MDP IV COMPARISON:   None Correlation: CT chest abdomen pelvis 02/15/2021, CT thoracic and lumbar spine 02/18/2021, CT head 02/15/2021 FINDINGS: Multiple sites of abnormal tracer uptake are identified consistent with widespread osseous metastatic disease. These include calvaria greatest at occipital bone growth more on LEFT, thoracic and lumbar spine, RIGHT ribs, and pelvis. Dextroconvex thoracolumbar scoliosis. No definite abnormal tracer uptake within the humeri or femora. Expected urinary tract and soft tissue distribution of tracer. IMPRESSION: Scattered osseous metastases as above. Electronically Signed   By: Lavonia Dana M.D.   On: 02/27/2021 18:17   US Renal  Result Date: 02/18/2021 CLINICAL DATA:  Back pain. Right renal  pelvis fullness on right upper quadrant ultrasound performed 1 day prior EXAM: RENAL / URINARY TRACT ULTRASOUND COMPLETE COMPARISON:  02/17/2021 abdominal sonogram. FINDINGS: Right Kidney: Renal measurements: 10.3 x 4.2 x 4.2 cm = volume: 95 mL. Normal parenchymal echogenicity and thickness. Mild right hydronephrosis. Possible 5 mm right ureteropelvic junction stone. Nonobstructing 3 mm lower right renal stone. No renal masses. Left Kidney: Renal measurements: 10.0 x 4.8 x 4.2 cm = volume: 107 mL. Normal parenchymal echogenicity and thickness. No left hydronephrosis. No renal masses. Probable nonobstructing 7 mm upper left renal stone. Bladder: Appears normal for degree of bladder distention. Bilateral ureteral jets seen in the bladder. Other: None. IMPRESSION: 1. Mild right hydronephrosis. Possible 5 mm right UPJ stone. Nonobstructing 3 mm lower right renal stone. Suggest unenhanced CT abdomen/pelvis for further evaluation. 2. Probable nonobstructing upper left renal stone. Electronically Signed   By: Ilona Sorrel M.D.   On: 02/18/2021 12:02   CT CHEST ABDOMEN PELVIS W CONTRAST  Result Date: 02/15/2021 CLINICAL DATA:  Altered mental status. 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 EXAM:  CT CHEST, ABDOMEN, AND PELVIS WITH CONTRAST TECHNIQUE: Multidetector CT imaging of the chest, abdomen and pelvis was performed following the standard protocol during bolus administration of intravenous contrast. CONTRAST:  75 mL OMNIPAQUE IOHEXOL 300 MG/ML  SOLN COMPARISON:  None. FINDINGS: CT CHEST FINDINGS Cardiovascular: No significant vascular findings. Normal heart size. No pericardial effusion. Mediastinum/Nodes: The patient has extensive lymphadenopathy. A 2 cm left axillary node is seen on image 11 of series 2; a second left axillary node on image 14 measures 1.4 cm. A 1.1 cm subpectoral node is seen on the left on image 15 of series 2. A 1.4 cm left supraclavicular node is seen on image 9. Right paratracheal node on image 15 measures 1.9 cm. Right precarinal nodal mass on image 25 measures 2.6 cm and there is a right hilar nodal mass measuring 4.2 x 3.1 cm on image 30. The esophagus and thyroid are negative. Lungs/Pleura: Small right pleural effusion. A spiculated nodule in the right middle lobe measures 2.6 cm transverse by 2.7 cm AP on image 77, series 4. Scattered pulmonary nodules include a 0.5 cm nodule in the superior segment of the left lower lobe on image 80, 0.4 cm left upper lobe nodule on image 76 and 2 punctate nodules on image 83. Additional smaller nodules are seen scattered through both lungs. The lungs are emphysematous. No pleural effusion. Musculoskeletal: A 0.9 cm lytic lesion is seen in T5, lytic lesions are seen in T7 and T9, larger in T9. There is also a small lytic lesion in T10. CT ABDOMEN PELVIS FINDINGS Hepatobiliary: Metastatic lesions are seen in the left hepatic lobe measuring 1.7 cm on image 57, series 2 and near the dome of the liver on image 50 of series 2 where a 1.1 cm lesion is identified. A second lesion near the dome of the liver in the right hepatic lobe on image 50 measures 0.7 cm. There is mild intra and extrahepatic biliary ductal dilatation likely related to prior  cholecystectomy. Pancreas: Unremarkable. No pancreatic ductal dilatation or surrounding inflammatory changes. Spleen: Normal in size without focal abnormality. Adrenals/Urinary Tract: Adrenal glands are unremarkable. Kidneys are normal, without renal calculi, focal lesion, or hydronephrosis. Bladder is unremarkable. Stomach/Bowel: Stomach is within normal limits. Appendix appears normal. No evidence of bowel wall thickening, distention, or inflammatory changes. Moderately large colonic stool burden throughout noted. Vascular/Lymphatic: Aortic atherosclerosis. No aneurysm. No pathologic lymphadenopathy  by CT size criteria. Reproductive: Status post hysterectomy. No adnexal masses. Other: None. Musculoskeletal: Mottled appearance of the L2 vertebral body is consistent with metastatic disease. There is a mild pathologic superior endplate compression fracture of L2 with vertebral body height loss centrally of approximately 30%. IMPRESSION: Findings consistent with extensive metastatic carcinoma likely emanating from a 2.7 cm right middle lobe pulmonary nodule. Metastases include extensive lymphadenopathy in the chest,, pulmonary nodule bone lesions and liver lesions. Mild pathologic fracture of L2 with vertebral body height loss centrally of up to approximately 30% is again noted. Aortic Atherosclerosis (ICD10-I70.0) and Emphysema (ICD10-J43.9). Electronically Signed   By: Inge Rise M.D.   On: 02/15/2021 14:25   DG Chest Portable 1 View  Result Date: 03/15/2021 CLINICAL DATA:  Shortness of breath EXAM: PORTABLE CHEST 1 VIEW COMPARISON:  03/07/2021 FINDINGS: Cardiac shadow is stable. Patchy airspace opacities are again identified slightly improved when compared with the prior exam. No new focal infiltrate is seen. No bony abnormality is noted. IMPRESSION: Slight improvement in previously seen airspace opacities bilaterally. Electronically Signed   By: Inez Catalina M.D.   On: 03/15/2021 11:38   DG Chest  Portable 1 View  Result Date: 03/07/2021 CLINICAL DATA:  Shortness of breath EXAM: PORTABLE CHEST 1 VIEW COMPARISON:  03/05/2021 FINDINGS: Patchy bilateral airspace disease throughout the lungs, slightly worsened since prior study. Nodular areas within the lungs, stable since prior study. Heart is normal size. No effusions or acute bony abnormality. IMPRESSION: Worsening patchy bilateral airspace disease, likely worsening pneumonia. Scattered nodules unchanged. Electronically Signed   By: Rolm Baptise M.D.   On: 03/07/2021 21:41   DG Chest Port 1 View  Result Date: 02/27/2021 CLINICAL DATA:  Worsening shortness of breath since this morning. Recent diagnosis of lung cancer. EXAM: PORTABLE CHEST 1 VIEW COMPARISON:  Radiograph earlier today.  CT earlier today. FINDINGS: Stable heart size and mediastinal contours. Right hilar prominence and thickening of the lower paratracheal stripe related to known adenopathy. Unchanged right mid lung pulmonary nodule. Mild patchy bilateral suprahilar opacities corresponding to ground-glass opacity on CT earlier today. No significant change in the interim. No pneumothorax or large pleural effusion. Retained excreted IV contrast in the right renal collecting system with right hydronephrosis, partially included in the upper abdomen. IMPRESSION: 1. Stable radiographic appearance of the chest from earlier today. Similar patchy suprahilar opacities corresponding to ground-glass opacity on CT. 2. Right mid lung pulmonary nodule and thoracic adenopathy. 3. Partially included in the upper abdomen is retained excreted contrast in the right renal collecting system with right hydronephrosis. Right hydronephrosis was seen on renal ultrasound 02/18/2021. Electronically Signed   By: Keith Rake M.D.   On: 02/27/2021 15:04   DG Chest Portable 1 View  Result Date: 02/27/2021 CLINICAL DATA:  60 year old female with shortness of breath and cough. Metastatic lung cancer. EXAM: PORTABLE  CHEST 1 VIEW COMPARISON:  02/15/2021 and prior studies FINDINGS: New hazy opacities within the central/upper lungs noted. RIGHT middle lobe mass and small scattered pulmonary nodules bilaterally are again identified. No pleural effusion or pneumothorax. No acute bony abnormalities are identified. IMPRESSION: New hazy opacities within the central/upper lungs which may represent edema or infection. Unchanged RIGHT middle lobe mass and bilateral pulmonary nodules compatible with known malignancy/metastases. Electronically Signed   By: Margarette Canada M.D.   On: 02/27/2021 08:49   [place] CORE BIOPSY (LYMPH NODES)  Result Date: 02/21/2021 INDICATION: Multiple enlarged lymph nodes, right middle lobe lung mass, liver lesions and bone lesions. EXAM:  ULTRASOUND GUIDED CORE BIOPSY OF LEFT SUPRACLAVICULAR LYMPH NODE MEDICATIONS: None. ANESTHESIA/SEDATION: Versed 2.0 mg IV Moderate Sedation Time:  12 minutes. The patient was continuously monitored during the procedure by the interventional radiology nurse under my direct supervision. PROCEDURE: The procedure, risks, benefits, and alternatives were explained to the patient. Questions regarding the procedure were encouraged and answered. The patient understands and consents to the procedure. A time-out was performed prior to initiating the procedure. The left neck was prepped with chlorhexidine in a sterile fashion, and a sterile drape was applied covering the operative field. A sterile gown and sterile gloves were used for the procedure. Local anesthesia was provided with 1% Lidocaine. After localizing an enlarged left supraclavicular lymph node by ultrasound, multiple 18 gauge core biopsy samples were obtained. Core biopsy samples were submitted in formalin as well as on saline soaked Telfa gauze. Additional ultrasound was performed. COMPLICATIONS: None immediate. FINDINGS: An enlarged left supraclavicular lymph node measures approximately 3.0 x 1.8 x 2.9 cm. Solid core biopsy  samples were obtained. IMPRESSION: Ultrasound-guided core biopsy performed of an enlarged left supraclavicular lymph node measuring 3 cm in greatest dimensions. Electronically Signed   By: Aletta Edouard M.D.   On: 02/21/2021 15:31   CT Maxillofacial Wo Contrast  Result Date: 02/15/2021 CLINICAL DATA:  Headache with left-sided face/neck/head pain. On antibiotics for reported laryngitis with bilateral ear pain. EXAM: CT HEAD WITHOUT CONTRAST CT MAXILLOFACIAL WITHOUT CONTRAST TECHNIQUE: Multidetector CT imaging of the head and maxillofacial structures were performed using the standard protocol without intravenous contrast. Multiplanar CT image reconstructions of the maxillofacial structures were also generated. COMPARISON:  None. FINDINGS: CT HEAD FINDINGS Brain: The calvarial/skull base mass lesion is described below. Spur this process exerts mass effect on the left cerebellum with suspected narrowing of the fourth ventricle. No evidence of hydrocephalus. No evidence of acute large vascular territory infarct. No acute hemorrhage. Mild to moderate scattered white matter hypodensities, which are nonspecific but most likely relate to chronic microvascular ischemic disease. No midline shift. Vascular: No hyperdense vessel identified. Calcific atherosclerosis. Skull/skull base: There is aggressive soft tissue mass lesion involving the left occipital calvarium measuring up to approximately 5.5 x 3.1 by 3.3 cm (transverse by AP by craniocaudal) with bony destruction and expansion and extension inferiorly to involve the left temporal bone and skull base. There is mass effect on the inferior left cerebellum and possible involvement of the subjacent dural venous sinuses. Small left mastoid effusion. CT MAXILLOFACIAL FINDINGS Osseous: No fracture or mandibular dislocation. No destructive process in the face. Orbits: Negative. No traumatic or inflammatory finding. Sinuses: Small right maxillary sinus retention cyst.  Otherwise, sinuses are clear. Soft tissues: Negative. 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 Resulting mass effect on the left cerebellum and possible involvement of the subjacent dural venous sinuses. 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 Osteomyelitis is a differential consideration. Recommend MRI brain with and without contrast to further evaluate. Findings and recommendations discussed with Ashok Cordia, PA via telephone at 11:20 a.m. Electronically Signed   By: Margaretha Sheffield MD   On: 02/15/2021 11:29   US Abdomen Limited RUQ (LIVER/GB)  Result Date: 02/17/2021 CLINICAL DATA:  Nausea and vomiting for 1 day. EXAM: ULTRASOUND ABDOMEN LIMITED RIGHT UPPER QUADRANT COMPARISON:  CT chest, abdomen and pelvis 02/15/2021. FINDINGS: Gallbladder: Removed. Common bile duct: Diameter: 0.8 cm.  Liver: Three hypoechoic liver lesions measuring up to 1.8 cm are identified are identified and correlate with metastatic deposit seen on prior CT. Mild intrahepatic biliary ductal dilatation seen on CT is also noted. Portal vein is patent on color Doppler imaging with normal direction of blood flow towards the liver. Other: There is fullness of the right renal pelvis which is new since the patient's CT scan yesterday. IMPRESSION: Three hypoechoic liver lesions measuring up to 1.8 cm correlate with metastatic deposit seen on CT. Status post cholecystectomy. Mild intra and extrahepatic biliary ductal dilatation is likely related to cholecystectomy. No obstructing lesion is identified. Fullness of the right renal pelvis is new since yesterday's examination. This could be incidental. Correlation with dedicated renal ultrasound could be used for further evaluation if indicated. Electronically Signed   By: Inge Rise M.D.   On:  02/17/2021 12:53    PERFORMANCE STATUS (ECOG) : 1 - Symptomatic but completely ambulatory  Review of Systems Unless otherwise noted, a complete review of systems is negative.  Physical Exam General: NAD Pulmonary: Unlabored Extremities: no edema, no joint deformities Skin: no rashes Neurological: Weakness but otherwise nonfocal  IMPRESSION: Patient seen in the ICU.  Altered mental status initially corrected with Narcan.  Patient briefly required Narcan drip but this has now been discontinued.  Patient admits to taking more Percocet than what was ordered.  However, she would not or could not elucidate on the frequency and dosage of the Percocet.  She says that she was using the transdermal fentanyl as directed.  When asked, patient admits that at times she was taking the Percocet for emotional benefit instead of physical pain.  She denies SI or taking medications with intent for self-harm.  Patient has a lengthy history of addiction.  She has previously been followed by multiple pain clinics with past history of aberrant behaviors resulting in discharge (See notes from [facility]).  She has legitimate cause for pain given extensive tumor burden including widespread skeletal lesions.  Patient's comfort will likely require some form of opioid pain medication despite clear risk of prescribing.  I had a conversation today with both patient and sister regarding concerns of opioid misuse.  Patient's sister has agreed to assume responsibility for storage and administration of controlled medications.  Patient verbalized agreement with this plan.  Fentanyl has been discontinued.  She is currently on Percocet but notably has not required any dosing today despite naloxone administration yesterday.  It would be unusual for transdermal fentanyl to cause sudden altered mental status after 3 weeks of continuous and stable dosing.  Fentanyl still may be a reasonable option for providing long-acting  control.  Monitoring can include visual inspections of patches in the clinic.  If resumed, I would likely reduce the dose to 25 mcg every 72 hours. I would avoid Percocet at this point as this seems to be patient's preferred medication.   Methadone could also be an option as this would lessen euphoria and opioid craving, while also providing long acting pain control.   Low dose clonidine could also potentially have a role in managing her symptoms. Clonidine can help with reducing bone pain and lessening withdrawal/craving symptoms.   Patient is currently on dexamethasone, which should help control bony, inflammatory pain.  Today, patient is most concerned with depression and anxiety.    She was quite tearful during this conversation and is understandably anxious and fearful given her prognosis.  Patient has been started on clonazepam by the hospitalist team.  Patient  says that this has resulted in a good response with her feeling calmer and less anxious.  She asked if this could be prescribed in the outpatient setting.  I would have some concerns with the risk of having patient concurrently on an opioid and benzodiazepine given the cause of her this hospitalization.  My preference would be to have close clinic follow-up and assess how things go with sister taking a more active role in medication administration.  Patient was agreeable with starting an antidepressant, which hopefully will also help with symptoms of anxiety.  Patient will also likely benefit from grief counseling which we can discuss in the outpatient setting.  I will also ask that she be followed at time of discharge by community palliative care to assist with coordinating and monitoring care in the home.  I had a lengthy conversation with her and addressed her questions about her cancer, treatment plan, and prognosis.  She verbalized understanding that treatment is with palliative intent.  She knows that her cancer will ultimately be  terminal.  Her goals remain clearly aligned with ongoing treatment.  Unfortunately, patient's frailty and recurrent hospitalizations pose a risk to achieving this goal.  We discussed CODE STATUS.  Patient verbalized clearly that she would not want to be resuscitated nor have her life prolonged artificially on machines.  Both she and her sister verbalized agreement with DNR/DNI.  PLAN: -Continue current scope of treatment -Consider restarting transdermal fentanyl at 25 mcg every 72 hours vs starting low dose methadone -Sister to manage opioid medications after discharge -Clonidine could be considered for pain/opioid cravings -Continue dexamethasone daily -Start sertraline 50 mg daily -DNR/DNI -Referral for community-based palliative care -Consider referral for [facility] counseling at [facility] -Add quantitative UDS (Drugs of Abuse) to previous sample if possible -Please discharge patient home with Rx for naloxone kit -Will plan follow up in the clinic after discharge from the hospital  Case and plan discussed with Dr. Tasia Catchings and Dr. Hilma Favors  Time Total: 75 minutes  Visit consisted of counseling and education dealing with the complex and emotionally intense issues of symptom management and palliative care in the setting of serious and potentially life-threatening illness.Greater than 50%  of this time was spent counseling and coordinating care related to the above assessment and plan.  Signed by: Altha Harm, PhD, NP-C

## 2021-03-16 NOTE — Progress Notes (Signed)
Initial Nutrition Assessment  DOCUMENTATION CODES:   Underweight  INTERVENTION:   -Boost Breeze po TID, each supplement provides 250 kcal and 9 grams of protein -Magic cup TID with meals, each supplement provides 290 kcal and 9 grams of protein -MVI with minerals daily  NUTRITION DIAGNOSIS:   Increased nutrient needs related to cancer and cancer related treatments as evidenced by estimated needs.  GOAL:   Patient will meet greater than or equal to 90% of their needs  MONITOR:   PO intake,Supplement acceptance,Labs,Weight trends,Skin,I & O's  REASON FOR ASSESSMENT:   Malnutrition Screening Tool    ASSESSMENT:   Brenda Middleton is a 60 y.o. female with medical history significant for anxiety, hypertension, depression, nicotine dependence who presents to the emergency room via EMS for evaluation of change in mental status.  Patient received her first dose of chemotherapy for metastatic adenocarcinoma of the lung on 03/15/21.   Her sister found her unresponsive in the morning of her admission and called EMS.  Pt admitted with sepsis secondary to probable post-obstructive pneumonia with acute respiratory failure.   Reviewed I/O's: +627 ml x 24 hours  UOP: 350 ml x 24 hours  Pt familiar to this RD from multiple prior admissions. Per chart review, pt has been very combative and refusing care. Pt currently in with RN receiving nursing care at time of visit. RD unable to obtain further nutrition-related history or complete nutrition-focused physical exam at this time.   Pt with poor oral intake. Noted meal completion 0%. Pt has refused Ensure Enlive supplements in the past.   Reviewed wt hx; pt has experienced a 6.8% wt loss over the past 6 months, which significant for time frame.   RD highly suspects malnutrition, however, unable to identify at this time. Pt would greatly benefit from addition of oral nutrition supplements.   Palliative care consult pending.   Medications  reviewed and include decadron and lactated ringers infusion @ 75 ml/hr.   Labs reviewed.   Diet Order:   Diet Order            Diet regular Room service appropriate? Yes; Fluid consistency: Thin  Diet effective now                 EDUCATION NEEDS:   No education needs have been identified at this time  Skin:  Skin Assessment: Skin Integrity Issues: Skin Integrity Issues:: Stage II Stage II: coccyx  Last BM:  Unknown  Height:   Ht Readings from Last 1 Encounters:  03/15/21 5\' 6"  (1.676 m)    Weight:   Wt Readings from Last 1 Encounters:  03/15/21 50.7 kg    Ideal Body Weight:  59.1 kg  BMI:  Body mass index is 18.04 kg/m.  Estimated Nutritional Needs:   Kcal:  1800-2000  Protein:  100-115 grams  Fluid:  > 1.8 L    Loistine Chance, RD, LDN, [place] Registered Dietitian II Certified Diabetes Care and Education Specialist Please refer to [facility] for RD and/or RD on-call/weekend/after hours pager

## 2021-03-16 NOTE — Consult Note (Signed)
Pharmacy Antibiotic Note  Brenda Middleton is a 60 y.o. female with PMH of anxiety, HTN, MDD, nicotine dependence, metastatic adenocarcinoma of the lung (started chemo 03/15/21) admitted on 03/15/2021 with pneumonia.  Pharmacy has been consulted for cefepime dosing. Since admission her renal function has improved to what appears to be her baseline level.  Plan: adjust cefepime to 2 grams IV every 8 hours  Height: 5\' 6"  (167.6 cm) Weight: 50.7 kg (111 lb 12.4 oz) IBW/kg (Calculated) : 59.3  Temp (24hrs), Avg:98.2 F (36.8 C), Min:98.1 F (36.7 C), Max:98.4 F (36.9 C)  Recent Labs  Lab 03/14/21 0817 03/15/21 1109 03/15/21 1210 03/15/21 1554 03/15/21 1951 03/16/21 0314  WBC 27.6* 18.1*  --   --   --  16.5*  CREATININE 1.02* 0.86  --   --   --  0.72  LATICACIDVEN  --  2.0* 2.1* 1.5 0.9  --     Estimated Creatinine Clearance: 59.9 mL/min (by C-G formula based on SCr of 0.72 mg/dL).    Allergies  Allergen Reactions  . Penicillin G Hives    Other reaction(s): HIVES Other reaction(s): HIVES  . Penicillin V Itching  . Penicillins     Antimicrobials this admission: vancomycin 4/12 >> 4/13 cefepime 4/12 >>   Microbiology results: 4/12 BCx: 1/4 S epidermidis  4/12 MRSA PCR: negative 4/12 SARS CoV-2: negative 4/12 influenza A/B: negative  Thank you for allowing pharmacy to be a part of this patient's care.  Dallie Piles 03/16/2021 11:09 AM

## 2021-03-16 NOTE — Consult Note (Signed)
Hematology/Oncology Consult note [facility] [phone] [phone]  Patient Care Team: Remi Haggard, FNP as PCP - General (Family Medicine) Remi Haggard, FNP (Family Medicine) Christene Lye, MD (General Surgery) Telford Nab, RN as Oncology Nurse Navigator   Name of the patient: Brenda Middleton  [id number]  [birth year]   Date of visit: 03/16/21 REASON FOR COSULTATION:  Stage IV lung cancer History of presenting illness-  60 y.o. female with PMH listed at below who was sent to emergency room via EMS for evaluation of change of mental status. Patient is well-known to oncology service due to stage IV lung cancer with extensive metastasis. She was recently hospitalized for possible HCAP versus lymphangitic cancer spread of the lung.  Procalcitonin at that time was low.  Patient was treated with empiric antibiotics and discharge.  I also started the patient on Levaquin since her discharge for coverage of possible obstructive pneumonia. Patient received cycle 1 day 1 dose reduce carboplatin/Alimta yesterday. Patient has a history of substance abuse.  She has neoplasm related pain and her pain regimen has been fentanyl patch plus as needed short acting narcotics.  She is not on oxygen supplementation at baseline Patient was not able to provide detailed history due to the mental status change.  Medical records were reviewed. Per EMS, patient was found to be hypoxia in the 80s on room air and upon arrival to ER she was placed on 2 L of oxygen via nasal cannula.  Patient received a dose of Narcan in ER and fentanyl patch was removed with transient improvement in her mental status.  Patient remains lethargic during her ER evaluation but arouses to verbal stimuli. Patient was found to have marked leukocytosis, -acute on chronic.  She has chronic leukocytosis due to being on dexamethasone 4 mg twice daily for brain edema secondary to  metastasis.  Lactic acid was relatively elevated Urinalysis nonremarkable.  X-ray showed bilateral airspace opacities patient was admitted to ICU for pneumonia/sepsis/acute respiratory failure and mental status change. Oncology was consulted due to her history of stage IV lung cancer. Patient was evaluated bedside in ICU.  She is lethargic and not able to provide detailed history.  No family members at bedside  Review of Systems  Unable to perform ROS: Mental status change    Allergies  Allergen Reactions  . Penicillin G Hives    Other reaction(s): HIVES Other reaction(s): HIVES  . Penicillin V Itching  . Penicillins     Patient Active Problem List   Diagnosis Date Noted  . Thrombocytopenia ([place]) 03/15/2021  . Hypokalemia 03/15/2021  . Hypomagnesemia 03/15/2021  . Acute respiratory failure with hypoxia ([place]) 03/07/2021  . Postobstructive pneumonia 02/27/2021  . HTN (hypertension) 02/27/2021  . Depression with anxiety 02/27/2021  . Normocytic anemia 02/27/2021  . Sepsis ([place]) 02/27/2021  . Protein-calorie malnutrition, moderate ([place]) 02/27/2021  . Malignant neoplasm of lung ([place]) 02/27/2021  . Elevated troponin 02/27/2021  . Toxic metabolic encephalopathy 25/85/2778  . Brain metastases ([place]) 02/27/2021  . Shortness of breath   . Metastatic malignant neoplasm ([place])   . Intractable low back pain 02/18/2021  . Anxiety   . Depressive disorder   . Pathologic fracture of lumbar vertebra, initial encounter   . 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 COPD exacerbation ([place])   . GERD (gastroesophageal reflux disease)   . Palliative care encounter   . Pathologic compression fracture of lumbar vertebra ([place])   . Goals of care,  counseling/discussion   . Neoplasm related pain   . Iron deficiency anemia due to chronic blood loss 09/12/2019  . Family history of cancer 09/12/2019  . Blood in the stool 09/12/2019  . Elevated alkaline phosphatase level 09/12/2019  . Chronic low back  pain 08/26/2014     Past Medical History:  Diagnosis Date  . Anxiety   . Benign neoplasm of cervix uteri   . Chronic hepatitis C without mention of hepatic coma   . Chronic low back pain 08/26/2014  . Constipation   . Depressive disorder   . Displacement of cervical intervertebral disc   . Hypertensive disorder   . Iron deficiency anemia due to chronic blood loss 09/12/2019  . Palpitations   . Prolapsed cervical intervertebral disc      Past Surgical History:  Procedure Laterality Date  . CONIZATION CERVIX    . DILATION AND CURETTAGE OF UTERUS    . HAND SURGERY  2008,2015   Carpel Tunnel  . TONSILLECTOMY    . VULVECTOMY      Social History   Socioeconomic History  . Marital status: Single    Spouse name: Not on file  . Number of children: 0  . Years of education: college1  . Highest education level: Not on file  Occupational History    Employer: UNEMPLOYED  Tobacco Use  . Smoking status: Former Smoker    Packs/day: 0.70    Years: 35.00    Pack years: 24.50    Types: Cigarettes  . Smokeless tobacco: Never Used  Vaping Use  . Vaping Use: Never used  Substance and Sexual Activity  . Alcohol use: Yes    Comment: occasional wine  . Drug use: No  . Sexual activity: Not Currently  Other Topics Concern  . Not on file  Social History Narrative  . Not on file   Social Determinants of Health   Financial Resource Strain: Not on file  Food Insecurity: Not on file  Transportation Needs: Not on file  Physical Activity: Not on file  Stress: Not on file  Social Connections: Not on file  Intimate Partner Violence: Not on file     Family History  Problem Relation Age of Onset  . Breast cancer Mother 19  . Lung cancer Father   . Cancer Brother   . Cancer Other   . Stroke Other      Current Facility-Administered Medications:  .  acetaminophen (TYLENOL) tablet 650 mg, 650 mg, Oral, Q6H PRN **OR** acetaminophen (TYLENOL) suppository 650 mg, 650 mg, Rectal, Q6H  PRN, Agbata, Tochukwu, MD .  ceFEPIme (MAXIPIME) 2 g in sodium chloride 0.9 % 100 mL IVPB, 2 g, Intravenous, Q12H, Beers, Shanon Brow, RPH, Last Rate: 200 mL/hr at 03/16/21 0214, 2 g at 03/16/21 0214 .  Chlorhexidine Gluconate Cloth 2 % PADS 6 each, 6 each, Topical, Daily, Agbata, Tochukwu, MD, 6 each at 03/15/21 1550 .  dexamethasone (DECADRON) tablet 4 mg, 4 mg, Oral, BID, Agbata, Tochukwu, MD .  guaiFENesin (MUCINEX) 12 hr tablet 1,200 mg, 1,200 mg, Oral, BID, Agbata, Tochukwu, MD .  ipratropium-albuterol (DUONEB) 0.5-2.5 (3) MG/3ML nebulizer solution 3 mL, 3 mL, Nebulization, Q6H PRN, Agbata, Tochukwu, MD, 3 mL at 03/15/21 1606 .  lactated ringers infusion, , Intravenous, Continuous, Agbata, Tochukwu, MD, Last Rate: 125 mL/hr at 03/15/21 1750, Infusion Verify at 03/15/21 1750 .  magic mouthwash, 5 mL, Oral, TID PRN, Agbata, Tochukwu, MD .  MEDLINE mouth rinse, 15 mL, Mouth Rinse, BID, Agbata, Tochukwu, MD,  15 mL at 03/15/21 2215 .  mometasone-formoterol (DULERA) 100-5 MCG/ACT inhaler 2 puff, 2 puff, Inhalation, BID, Agbata, Tochukwu, MD .  nicotine (NICODERM CQ - dosed in mg/24 hours) patch 14 mg, 14 mg, Transdermal, Daily, Agbata, Tochukwu, MD .  ondansetron (ZOFRAN) tablet 4 mg, 4 mg, Oral, Q6H PRN **OR** ondansetron (ZOFRAN) injection 4 mg, 4 mg, Intravenous, Q6H PRN, Agbata, Tochukwu, MD .  pantoprazole (PROTONIX) injection 40 mg, 40 mg, Intravenous, Q24H, Agbata, Tochukwu, MD, 40 mg at 03/15/21 2100 .  potassium chloride SA (KLOR-CON) CR tablet 40 mEq, 40 mEq, Oral, Daily, Agbata, Tochukwu, MD .  vancomycin (VANCOCIN) IVPB 1000 mg/200 mL premix, 1,000 mg, Intravenous, Q24H, Beers, Shanon Brow, RPH, Last Rate: 200 mL/hr at 03/16/21 0255, 1,000 mg at 03/16/21 0255   Physical exam:  Vitals:   03/16/21 0300 03/16/21 0400 03/16/21 0500 03/16/21 0600  BP: (!) 160/90 (!) 142/63 (!) 148/93 (!) 144/92  Pulse: (!) 106 (!) 106 (!) 107 (!) 103  Resp: 11 11 10 14   Temp:  98.4 F (36.9 C)    TempSrc:   Axillary    SpO2: 93% 91% (!) 89% 92%  Weight:      Height:       Physical Exam Constitutional:      Appearance: She is not diaphoretic.  HENT:     Head: Normocephalic and atraumatic.     Nose: Nose normal.     Mouth/Throat:     Pharynx: No oropharyngeal exudate.     Comments: Hoarse voice Eyes:     General: No scleral icterus.    Pupils: Pupils are equal, round, and reactive to light.  Cardiovascular:     Rate and Rhythm: Normal rate and regular rhythm.     Heart sounds: No murmur heard.   Pulmonary:     Effort: Pulmonary effort is normal.     Comments: Rhonchi bilaterally On nasal cannula oxygen Chest:     Chest wall: No tenderness.  Abdominal:     General: There is no distension.     Palpations: Abdomen is soft.     Tenderness: There is no abdominal tenderness.  Musculoskeletal:        General: Normal range of motion.     Cervical back: Normal range of motion and neck supple.  Skin:    General: Skin is warm and dry.     Findings: No erythema.  Neurological:     Cranial Nerves: No cranial nerve deficit.     Motor: No abnormal muscle tone.     Coordination: Coordination normal.     Comments: Lethargic  Psychiatric:        Mood and Affect: Affect normal.         CMP Latest Ref Rng & Units 03/16/2021  Glucose 70 - 99 mg/dL 143(H)  BUN 6 - 20 mg/dL 26(H)  Creatinine 0.44 - 1.00 mg/dL 0.72  Sodium 135 - 145 mmol/L 140  Potassium 3.5 - 5.1 mmol/L 4.0  Chloride 98 - 111 mmol/L 109  CO2 22 - 32 mmol/L 24  Calcium 8.9 - 10.3 mg/dL 7.7(L)  Total Protein 6.5 - 8.1 g/dL -  Total Bilirubin 0.3 - 1.2 mg/dL -  Alkaline Phos 38 - 126 U/L -  AST 15 - 41 U/L -  ALT 0 - 44 U/L -   CBC Latest Ref Rng & Units 03/16/2021  WBC 4.0 - 10.5 K/uL 16.5(H)  Hemoglobin 12.0 - 15.0 g/dL 7.6(L)  Hematocrit 36.0 - 46.0 % 23.6(L)  Platelets  150 - 400 K/uL 99(L)    RADIOGRAPHIC STUDIES: I have personally reviewed the radiological images as listed and agreed with the findings  in the report. DG Chest 2 View  Result Date: 03/05/2021 CLINICAL DATA:  Shortness of breath EXAM: CHEST - 2 VIEW COMPARISON:  02/27/2021 FINDINGS: Heart is normal size. Right upper lobe mass again noted as seen on CT. Patchy bilateral airspace disease again noted, similar to prior study. No effusions or acute bony abnormality. IMPRESSION: Patchy bilateral airspace disease again noted, not significantly changed. Right upper lobe mass again seen. Electronically Signed   By: Rolm Baptise M.D.   On: 03/05/2021 21:42   DG Chest 2 View  Result Date: 02/15/2021 CLINICAL DATA:  Weight loss and cough EXAM: CHEST - 2 VIEW COMPARISON:  None. FINDINGS: The heart size and mediastinal contours are within normal limits. Rounded patchy nodular opacity seen within the right upper lobe and right mid lung. The left lung is clear. No acute osseous abnormality. IMPRESSION: Rounded patchy airspace opacities in the right mid lung which may be due to infectious etiology, however cannot exclude metastatic disease. Would recommend CT chest with contrast for further evaluation. Electronically Signed   By: Prudencio Pair M.D.   On: 02/15/2021 12:16   CT Head Wo Contrast  Result Date: 02/15/2021 CLINICAL DATA:  Headache with left-sided face/neck/head pain. On antibiotics for reported laryngitis with bilateral ear pain. EXAM: CT HEAD WITHOUT CONTRAST CT MAXILLOFACIAL WITHOUT CONTRAST TECHNIQUE: Multidetector CT imaging of the head and maxillofacial structures were performed using the standard protocol without intravenous contrast. Multiplanar CT image reconstructions of the maxillofacial structures were also generated. COMPARISON:  None. FINDINGS: CT HEAD FINDINGS Brain: The calvarial/skull base mass lesion is described below. Spur this process exerts mass effect on the left cerebellum with suspected narrowing of the fourth ventricle. No evidence of hydrocephalus. No evidence of acute large vascular territory infarct. No acute  hemorrhage. Mild to moderate scattered white matter hypodensities, which are nonspecific but most likely relate to chronic microvascular ischemic disease. No midline shift. Vascular: No hyperdense vessel identified. Calcific atherosclerosis. Skull/skull base: There is aggressive soft tissue mass lesion involving the left occipital calvarium measuring up to approximately 5.5 x 3.1 by 3.3 cm (transverse by AP by craniocaudal) with bony destruction and expansion and extension inferiorly to involve the left temporal bone and skull base. There is mass effect on the inferior left cerebellum and possible involvement of the subjacent dural venous sinuses. Small left mastoid effusion. CT MAXILLOFACIAL FINDINGS Osseous: No fracture or mandibular dislocation. No destructive process in the face. Orbits: Negative. No traumatic or inflammatory finding. Sinuses: Small right maxillary sinus retention cyst. Otherwise, sinuses are clear. Soft tissues: Negative. 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 Resulting mass effect on the left cerebellum and possible involvement of the subjacent dural venous sinuses. 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 Osteomyelitis is a differential consideration. Recommend MRI brain with and without contrast to further evaluate. Findings and recommendations discussed with Ashok Cordia, PA via telephone at 11:20 a.m. Electronically Signed   By: Margaretha Sheffield MD   On: 02/15/2021 11:29   CT CHEST W CONTRAST  Result Date: 03/06/2021 CLINICAL DATA:  Respiratory failure, shortness of breath EXAM: CT CHEST WITH CONTRAST TECHNIQUE: Multidetector CT imaging of the chest was performed during intravenous contrast administration. CONTRAST:  71mL OMNIPAQUE IOHEXOL 300 MG/ML  SOLN COMPARISON:  02/27/2021.  Chest  x-ray 03/05/2021 FINDINGS: Cardiovascular: Heart is normal size. Coronary artery and aortic calcifications. No aneurysm. Mediastinum/Nodes: Bulky mediastinal and bilateral hilar adenopathy again noted, unchanged. Trachea is patent. Thyroid unremarkable. Lungs/Pleura: Ground-glass airspace opacities in the lower lobes have worsened since prior study, particularly in the left lower lobe. Ground-glass opacities in the upper lobes somewhat improved since prior study. Numerous bilateral pulmonary nodules are stable. Right upper lobe mass again noted and stable. No effusions. Upper Abdomen: Imaging into the upper abdomen demonstrates no acute findings. Musculoskeletal: Chest wall soft tissues are unremarkable. Multiple osseous metastases again noted, unchanged. IMPRESSION: Redemonstrated is extensive metastatic disease in the chest with numerous pulmonary nodules, dominant right upper lobe mass, and bulky mediastinal/bilateral hilar adenopathy. Ground-glass opacities have increased in the lower lobes, left greater than right, likely infectious/inflammatory. Somewhat improvement in ground-glass opacities in the upper lobe since prior study. Stable osseous metastatic disease. Coronary artery disease. Aortic Atherosclerosis (ICD10-I70.0). Electronically Signed   By: Rolm Baptise M.D.   On: 03/06/2021 00:11   CT Angio Chest PE W and/or Wo Contrast  Result Date: 02/27/2021 CLINICAL DATA:  PE suspected, widely metastatic lung cancer EXAM: CT ANGIOGRAPHY CHEST WITH CONTRAST TECHNIQUE: Multidetector CT imaging of the chest was performed using the standard protocol during bolus administration of intravenous contrast. Multiplanar CT image reconstructions and MIPs were obtained to evaluate the vascular anatomy. CONTRAST:  29mL OMNIPAQUE IOHEXOL 350 MG/ML SOLN COMPARISON:  02/15/2021 FINDINGS: Cardiovascular: Satisfactory opacification of the pulmonary arteries to the segmental level. No evidence of pulmonary embolism.  Cardiomegaly. Left and right coronary artery calcifications and stents. No pericardial effusion. Mediastinum/Nodes: Redemonstrated, very extensive bulky bilateral hilar, mediastinal, supraclavicular, and left axillary lymphadenopathy. Thyroid gland, trachea, and esophagus demonstrate no significant findings. Lungs/Pleura: Diffuse bilateral bronchial wall thickening. Spiculated right upper lobe mass as seen on prior examination (series 7, image 42). Numerous redemonstrated small pulmonary nodules throughout the lungs. There is new, extensive ground-glass airspace opacity throughout the lungs, somewhat geographic in the upper lobes (series 7, image 33), although somewhat more nodular appearing in the lower lungs (series 7, image 58). No pleural effusion or pneumothorax. Upper Abdomen: No acute abnormality. Multiple low-attenuation lesions of liver, better assessed by prior dedicated of the abdomen. Musculoskeletal: No chest wall abnormality. Multiple osseous metastatic lesions, most significantly a lytic lesion of the T9 vertebral body (series 9, image 58). Review of the MIP images confirms the above findings. IMPRESSION: 1. Negative examination for pulmonary embolism. 2. There is new, extensive ground-glass airspace opacity throughout the lungs, somewhat geographic in the upper lobes, although somewhat more nodular appearing in the lower lungs. Findings are nonspecific and infectious or inflammatory, differential considerations primarily include drug toxicity and atypical/viral infection. 3. Diffuse bilateral bronchial wall thickening, consistent with nonspecific infectious or inflammatory bronchitis. 4. Redemonstrated findings of advanced metastatic lung malignancy, better assessed by recent prior staging examination. 5. Coronary artery disease. Electronically Signed   By: Eddie Candle M.D.   On: 02/27/2021 10:49   CT Thoracic Spine Wo Contrast  Result Date: 02/18/2021 CLINICAL DATA:  Metastases on CT abdomen  EXAM: CT THORACIC AND LUMBAR SPINE WITHOUT CONTRAST TECHNIQUE: Multidetector CT imaging of the thoracic and lumbar spine was performed without contrast. Multiplanar CT image reconstructions were also generated. COMPARISON:  None. FINDINGS: CT THORACIC SPINE FINDINGS Alignment: Preserved. Vertebrae: There is a small partially sclerotic lesion at the right aspect of the T5 vertebral body. A lytic lesion is  present at the anterior aspect T9 likely with pathologic fracture but no substantial height loss. Ill-defined lucent lesions are present elsewhere. Paraspinal and other soft tissues: Better evaluated on prior dedicated imaging. Disc levels: Multilevel degenerative changes. No high-grade degenerative canal narrowing. CT LUMBAR SPINE FINDINGS Segmentation: 5 lumbar type vertebrae. Alignment: Dextrocurvature.  Grade 1 anterolisthesis at L4-L5 Vertebrae: Lucency and sclerosis at L2 with pathologic compression fracture resulting in less than 50% loss of height at the superior endplate. Sclerotic lesion of the right sacrum. Paraspinal and other soft tissues: Better evaluated on prior dedicated imaging. Disc levels: Multilevel degenerative changes. Canal stenosis is greatest at L3-L4 and L4-L5. Foraminal narrowing is greatest on the right at L5-S1. IMPRESSION: Sclerotic and lytic metastatic lesions as already identified on the CT chest, abdomen, and pelvis. Pathologic L2 fracture with less than 50% loss of height. Probable pathologic fracture at T9 without height loss. No apparent significant epidural disease by CT. Electronically Signed   By: Macy Mis M.D.   On: 02/18/2021 10:53   CT Lumbar Spine Wo Contrast  Result Date: 02/18/2021 CLINICAL DATA:  Metastases on CT abdomen EXAM: CT THORACIC AND LUMBAR SPINE WITHOUT CONTRAST TECHNIQUE: Multidetector CT imaging of the thoracic and lumbar spine was performed without contrast. Multiplanar CT image reconstructions were also generated. COMPARISON:  None. FINDINGS: CT  THORACIC SPINE FINDINGS Alignment: Preserved. Vertebrae: There is a small partially sclerotic lesion at the right aspect of the T5 vertebral body. A lytic lesion is present at the anterior aspect T9 likely with pathologic fracture but no substantial height loss. Ill-defined lucent lesions are present elsewhere. Paraspinal and other soft tissues: Better evaluated on prior dedicated imaging. Disc levels: Multilevel degenerative changes. No high-grade degenerative canal narrowing. CT LUMBAR SPINE FINDINGS Segmentation: 5 lumbar type vertebrae. Alignment: Dextrocurvature.  Grade 1 anterolisthesis at L4-L5 Vertebrae: Lucency and sclerosis at L2 with pathologic compression fracture resulting in less than 50% loss of height at the superior endplate. Sclerotic lesion of the right sacrum. Paraspinal and other soft tissues: Better evaluated on prior dedicated imaging. Disc levels: Multilevel degenerative changes. Canal stenosis is greatest at L3-L4 and L4-L5. Foraminal narrowing is greatest on the right at L5-S1. IMPRESSION: Sclerotic and lytic metastatic lesions as already identified on the CT chest, abdomen, and pelvis. Pathologic L2 fracture with less than 50% loss of height. Probable pathologic fracture at T9 without height loss. No apparent significant epidural disease by CT. Electronically Signed   By: Macy Mis M.D.   On: 02/18/2021 10:53   MR Brain W and Wo Contrast  Result Date: 02/15/2021 CLINICAL DATA:  Abnormal head CT.  Skull base mass. EXAM: MRI HEAD WITHOUT AND WITH CONTRAST TECHNIQUE: Multiplanar, multiecho pulse sequences of the brain and surrounding structures were obtained without and with intravenous contrast. CONTRAST:  58mL GADAVIST GADOBUTROL 1 MMOL/ML IV SOLN COMPARISON:  CT head 02/15/2021 FINDINGS: Brain: Large destructive mass in the posterior skull base bilaterally left greater than right. This involves the occipital bone with extension into the left temporal bone. The occipital bone is  significantly expanded due to tumor on the left with mass-effect on the left cerebellum. Soft tissue mass associated with left occipital lesion measures approximately 5.2 x 3.0 cm. Mild edema in the left cerebellum. There is infiltrating tumor in the left temporal bone which is more sizable than would be predicted from the CT. The soft tissue mass associated with the left occipital bone shows susceptibility which may be due to mineralization and/or bone fragments from bone destruction.  Ventricle size normal. No midline shift. Moderate white matter changes likely due to chronic microvascular ischemia. No acute infarct. Chronic microhemorrhage in the left parietal lobe. Subtle enhancing lesions in the brain in the right frontal lobe measuring approximately 10 mm, and 4 mm best seen on coronal and sagittal postcontrast imaging. This is suspicious for metastatic disease. Vascular: Normal arterial flow voids. Normal venous enhancement. No evidence of venous sinus thrombosis. Skull and upper cervical spine: Infiltrating destructive mass in the occipital bone bilaterally left greater than right with extension into the left temporal bone. Additional lesions in the frontal bone bilaterally and in the left parietal bone likely metastatic disease. Sinuses/Orbits: Mild mucosal edema paranasal sinuses. Negative orbit Other: None IMPRESSION: Destructive mass lesion in the occipital bone bilaterally left greater than right. There is associated soft tissue mass in the left occipital bone extending into the posterior fossa with mass-effect and mild edema of the left cerebellum. This mass extends into the left temporal bone. Additional bone lesions are present in the frontal bones and left parietal bone. Subtle enhancing lesions right frontal lobe measuring 10 mm and 4 mm. Findings compatible with metastatic disease. Electronically Signed   By: Franchot Gallo M.D.   On: 02/15/2021 13:47   NM Bone Scan Whole Body  Result Date:  02/27/2021 CLINICAL DATA:  Metastatic disease evaluation, lung mass, abnormal CT EXAM: NUCLEAR MEDICINE WHOLE BODY BONE SCAN TECHNIQUE: Whole body anterior and posterior images were obtained approximately 3 hours after intravenous injection of radiopharmaceutical. RADIOPHARMACEUTICALS:  19.79 mCi Technetium-59m MDP IV COMPARISON:  None Correlation: CT chest abdomen pelvis 02/15/2021, CT thoracic and lumbar spine 02/18/2021, CT head 02/15/2021 FINDINGS: Multiple sites of abnormal tracer uptake are identified consistent with widespread osseous metastatic disease. These include calvaria greatest at occipital bone growth more on LEFT, thoracic and lumbar spine, RIGHT ribs, and pelvis. Dextroconvex thoracolumbar scoliosis. No definite abnormal tracer uptake within the humeri or femora. Expected urinary tract and soft tissue distribution of tracer. IMPRESSION: Scattered osseous metastases as above. Electronically Signed   By: Lavonia Dana M.D.   On: 02/27/2021 18:17   US Renal  Result Date: 02/18/2021 CLINICAL DATA:  Back pain. Right renal pelvis fullness on right upper quadrant ultrasound performed 1 day prior EXAM: RENAL / URINARY TRACT ULTRASOUND COMPLETE COMPARISON:  02/17/2021 abdominal sonogram. FINDINGS: Right Kidney: Renal measurements: 10.3 x 4.2 x 4.2 cm = volume: 95 mL. Normal parenchymal echogenicity and thickness. Mild right hydronephrosis. Possible 5 mm right ureteropelvic junction stone. Nonobstructing 3 mm lower right renal stone. No renal masses. Left Kidney: Renal measurements: 10.0 x 4.8 x 4.2 cm = volume: 107 mL. Normal parenchymal echogenicity and thickness. No left hydronephrosis. No renal masses. Probable nonobstructing 7 mm upper left renal stone. Bladder: Appears normal for degree of bladder distention. Bilateral ureteral jets seen in the bladder. Other: None. IMPRESSION: 1. Mild right hydronephrosis. Possible 5 mm right UPJ stone. Nonobstructing 3 mm lower right renal stone. Suggest unenhanced  CT abdomen/pelvis for further evaluation. 2. Probable nonobstructing upper left renal stone. Electronically Signed   By: Ilona Sorrel M.D.   On: 02/18/2021 12:02   CT CHEST ABDOMEN PELVIS W CONTRAST  Result Date: 02/15/2021 CLINICAL DATA:  Altered mental status. 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 EXAM: CT CHEST, ABDOMEN, AND PELVIS WITH CONTRAST TECHNIQUE: Multidetector CT imaging of the chest, abdomen and pelvis was performed following the standard protocol during bolus administration of intravenous contrast. CONTRAST:  75 mL OMNIPAQUE IOHEXOL 300 MG/ML  SOLN COMPARISON:  None. FINDINGS: CT CHEST FINDINGS Cardiovascular: No significant vascular findings. Normal heart size. No pericardial effusion. Mediastinum/Nodes: The patient has extensive lymphadenopathy. A 2 cm left axillary node is seen on image 11 of series 2; a second left axillary node on image 14 measures 1.4 cm. A 1.1 cm subpectoral node is seen on the left on image 15 of series 2. A 1.4 cm left supraclavicular node is seen on image 9. Right paratracheal node on image 15 measures 1.9 cm. Right precarinal nodal mass on image 25 measures 2.6 cm and there is a right hilar nodal mass measuring 4.2 x 3.1 cm on image 30. The esophagus and thyroid are negative. Lungs/Pleura: Small right pleural effusion. A spiculated nodule in the right middle lobe measures 2.6 cm transverse by 2.7 cm AP on image 77, series 4. Scattered pulmonary nodules include a 0.5 cm nodule in the superior segment of the left lower lobe on image 80, 0.4 cm left upper lobe nodule on image 76 and 2 punctate nodules on image 83. Additional smaller nodules are seen scattered through both lungs. The lungs are emphysematous. No pleural effusion. Musculoskeletal: A 0.9 cm lytic lesion is seen in T5, lytic lesions are seen in T7 and T9, larger in T9. There is also a small lytic lesion in T10. CT ABDOMEN PELVIS FINDINGS Hepatobiliary: Metastatic lesions are seen in the  left hepatic lobe measuring 1.7 cm on image 57, series 2 and near the dome of the liver on image 50 of series 2 where a 1.1 cm lesion is identified. A second lesion near the dome of the liver in the right hepatic lobe on image 50 measures 0.7 cm. There is mild intra and extrahepatic biliary ductal dilatation likely related to prior cholecystectomy. Pancreas: Unremarkable. No pancreatic ductal dilatation or surrounding inflammatory changes. Spleen: Normal in size without focal abnormality. Adrenals/Urinary Tract: Adrenal glands are unremarkable. Kidneys are normal, without renal calculi, focal lesion, or hydronephrosis. Bladder is unremarkable. Stomach/Bowel: Stomach is within normal limits. Appendix appears normal. No evidence of bowel wall thickening, distention, or inflammatory changes. Moderately large colonic stool burden throughout noted. Vascular/Lymphatic: Aortic atherosclerosis. No aneurysm. No pathologic lymphadenopathy by CT size criteria. Reproductive: Status post hysterectomy. No adnexal masses. Other: None. Musculoskeletal: Mottled appearance of the L2 vertebral body is consistent with metastatic disease. There is a mild pathologic superior endplate compression fracture of L2 with vertebral body height loss centrally of approximately 30%. IMPRESSION: Findings consistent with extensive metastatic carcinoma likely emanating from a 2.7 cm right middle lobe pulmonary nodule. Metastases include extensive lymphadenopathy in the chest,, pulmonary nodule bone lesions and liver lesions. Mild pathologic fracture of L2 with vertebral body height loss centrally of up to approximately 30% is again noted. Aortic Atherosclerosis (ICD10-I70.0) and Emphysema (ICD10-J43.9). Electronically Signed   By: Inge Rise M.D.   On: 02/15/2021 14:25   DG Chest Portable 1 View  Result Date: 03/15/2021 CLINICAL DATA:  Shortness of breath EXAM: PORTABLE CHEST 1 VIEW COMPARISON:  03/07/2021 FINDINGS: Cardiac shadow is  stable. Patchy airspace opacities are again identified slightly improved when compared with the prior exam. No new focal infiltrate is seen. No bony abnormality is noted. IMPRESSION: Slight improvement in previously seen airspace opacities bilaterally. Electronically Signed   By: Inez Catalina M.D.   On: 03/15/2021 11:38   DG Chest Portable 1 View  Result Date: 03/07/2021 CLINICAL DATA:  Shortness of breath EXAM: PORTABLE CHEST 1 VIEW COMPARISON:  03/05/2021 FINDINGS: Patchy bilateral airspace disease throughout  the lungs, slightly worsened since prior study. Nodular areas within the lungs, stable since prior study. Heart is normal size. No effusions or acute bony abnormality. IMPRESSION: Worsening patchy bilateral airspace disease, likely worsening pneumonia. Scattered nodules unchanged. Electronically Signed   By: Rolm Baptise M.D.   On: 03/07/2021 21:41   DG Chest Port 1 View  Result Date: 02/27/2021 CLINICAL DATA:  Worsening shortness of breath since this morning. Recent diagnosis of lung cancer. EXAM: PORTABLE CHEST 1 VIEW COMPARISON:  Radiograph earlier today.  CT earlier today. FINDINGS: Stable heart size and mediastinal contours. Right hilar prominence and thickening of the lower paratracheal stripe related to known adenopathy. Unchanged right mid lung pulmonary nodule. Mild patchy bilateral suprahilar opacities corresponding to ground-glass opacity on CT earlier today. No significant change in the interim. No pneumothorax or large pleural effusion. Retained excreted IV contrast in the right renal collecting system with right hydronephrosis, partially included in the upper abdomen. IMPRESSION: 1. Stable radiographic appearance of the chest from earlier today. Similar patchy suprahilar opacities corresponding to ground-glass opacity on CT. 2. Right mid lung pulmonary nodule and thoracic adenopathy. 3. Partially included in the upper abdomen is retained excreted contrast in the right renal collecting  system with right hydronephrosis. Right hydronephrosis was seen on renal ultrasound 02/18/2021. Electronically Signed   By: Keith Rake M.D.   On: 02/27/2021 15:04   DG Chest Portable 1 View  Result Date: 02/27/2021 CLINICAL DATA:  60 year old female with shortness of breath and cough. Metastatic lung cancer. EXAM: PORTABLE CHEST 1 VIEW COMPARISON:  02/15/2021 and prior studies FINDINGS: New hazy opacities within the central/upper lungs noted. RIGHT middle lobe mass and small scattered pulmonary nodules bilaterally are again identified. No pleural effusion or pneumothorax. No acute bony abnormalities are identified. IMPRESSION: New hazy opacities within the central/upper lungs which may represent edema or infection. Unchanged RIGHT middle lobe mass and bilateral pulmonary nodules compatible with known malignancy/metastases. Electronically Signed   By: Margarette Canada M.D.   On: 02/27/2021 08:49   [place] CORE BIOPSY (LYMPH NODES)  Result Date: 02/21/2021 INDICATION: Multiple enlarged lymph nodes, right middle lobe lung mass, liver lesions and bone lesions. EXAM: ULTRASOUND GUIDED CORE BIOPSY OF LEFT SUPRACLAVICULAR LYMPH NODE MEDICATIONS: None. ANESTHESIA/SEDATION: Versed 2.0 mg IV Moderate Sedation Time:  12 minutes. The patient was continuously monitored during the procedure by the interventional radiology nurse under my direct supervision. PROCEDURE: The procedure, risks, benefits, and alternatives were explained to the patient. Questions regarding the procedure were encouraged and answered. The patient understands and consents to the procedure. A time-out was performed prior to initiating the procedure. The left neck was prepped with chlorhexidine in a sterile fashion, and a sterile drape was applied covering the operative field. A sterile gown and sterile gloves were used for the procedure. Local anesthesia was provided with 1% Lidocaine. After localizing an enlarged left supraclavicular lymph node by  ultrasound, multiple 18 gauge core biopsy samples were obtained. Core biopsy samples were submitted in formalin as well as on saline soaked Telfa gauze. Additional ultrasound was performed. COMPLICATIONS: None immediate. FINDINGS: An enlarged left supraclavicular lymph node measures approximately 3.0 x 1.8 x 2.9 cm. Solid core biopsy samples were obtained. IMPRESSION: Ultrasound-guided core biopsy performed of an enlarged left supraclavicular lymph node measuring 3 cm in greatest dimensions. Electronically Signed   By: Aletta Edouard M.D.   On: 02/21/2021 15:31   CT Maxillofacial Wo Contrast  Result Date: 02/15/2021 CLINICAL DATA:  Headache with left-sided face/neck/head pain.  On antibiotics for reported laryngitis with bilateral ear pain. EXAM: CT HEAD WITHOUT CONTRAST CT MAXILLOFACIAL WITHOUT CONTRAST TECHNIQUE: Multidetector CT imaging of the head and maxillofacial structures were performed using the standard protocol without intravenous contrast. Multiplanar CT image reconstructions of the maxillofacial structures were also generated. COMPARISON:  None. FINDINGS: CT HEAD FINDINGS Brain: The calvarial/skull base mass lesion is described below. Spur this process exerts mass effect on the left cerebellum with suspected narrowing of the fourth ventricle. No evidence of hydrocephalus. No evidence of acute large vascular territory infarct. No acute hemorrhage. Mild to moderate scattered white matter hypodensities, which are nonspecific but most likely relate to chronic microvascular ischemic disease. No midline shift. Vascular: No hyperdense vessel identified. Calcific atherosclerosis. Skull/skull base: There is aggressive soft tissue mass lesion involving the left occipital calvarium measuring up to approximately 5.5 x 3.1 by 3.3 cm (transverse by AP by craniocaudal) with bony destruction and expansion and extension inferiorly to involve the left temporal bone and skull base. There is mass effect on the inferior  left cerebellum and possible involvement of the subjacent dural venous sinuses. Small left mastoid effusion. CT MAXILLOFACIAL FINDINGS Osseous: No fracture or mandibular dislocation. No destructive process in the face. Orbits: Negative. No traumatic or inflammatory finding. Sinuses: Small right maxillary sinus retention cyst. Otherwise, sinuses are clear. Soft tissues: Negative. 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 Resulting mass effect on the left cerebellum and possible involvement of the subjacent dural venous sinuses. 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 Osteomyelitis is a differential consideration. Recommend MRI brain with and without contrast to further evaluate. Findings and recommendations discussed with Ashok Cordia, PA via telephone at 11:20 a.m. Electronically Signed   By: Margaretha Sheffield MD   On: 02/15/2021 11:29   US Abdomen Limited RUQ (LIVER/GB)  Result Date: 02/17/2021 CLINICAL DATA:  Nausea and vomiting for 1 day. EXAM: ULTRASOUND ABDOMEN LIMITED RIGHT UPPER QUADRANT COMPARISON:  CT chest, abdomen and pelvis 02/15/2021. FINDINGS: Gallbladder: Removed. Common bile duct: Diameter: 0.8 cm. Liver: Three hypoechoic liver lesions measuring up to 1.8 cm are identified are identified and correlate with metastatic deposit seen on prior CT. Mild intrahepatic biliary ductal dilatation seen on CT is also noted. Portal vein is patent on color Doppler imaging with normal direction of blood flow towards the liver. Other: There is fullness of the right renal pelvis which is new since the patient's CT scan yesterday. IMPRESSION: Three hypoechoic liver lesions measuring up to 1.8 cm correlate with metastatic deposit seen on CT. Status post cholecystectomy. Mild intra and extrahepatic biliary ductal dilatation  is likely related to cholecystectomy. No obstructing lesion is identified. Fullness of the right renal pelvis is new since yesterday's examination. This could be incidental. Correlation with dedicated renal ultrasound could be used for further evaluation if indicated. Electronically Signed   By: Inge Rise M.D.   On: 02/17/2021 12:53    Assessment and plan-   Sepsis/acute respiratory failure/Possible obstructive pneumonia or aspiration pneumonia Patient has been on steroids due to brain metastasis, steroid may have contributed to the marked leukocytosis. I agree with empiric antibiotics-vancomycin and cefepime.  Follow blood culture  Altered mental status, probably due to toxic encephalopathy due to sepsis versus opiate overdose Patient has been on Narcan gtt.  Stage IV lung adenocarcinoma, extensive metastasis Day 2 of cycle 1 dose  reduced carboplatin/Alimta. Poor prognosis given the extensive metastasis, multiple hospitalization,   Consult palliative care service for goals of care discussion and CODE STATUS discussion.  It is challenging to manage her neoplasm related pain given her history of substance abuse and the likelihood of mental status change secondary to opiate overdose.  Anemia and thrombocytopenia, acute on chronic.  Due to recent chemotherapy.  Transfuse PRBC to keep hemoglobin above 7.   Thank you for allowing me to participate in the care of this patient.   Earlie Server, MD, PhD Hematology Oncology [facility] Pager- [phone] 03/16/2021

## 2021-03-16 NOTE — Progress Notes (Signed)
PHARMACY - PHYSICIAN COMMUNICATION CRITICAL VALUE ALERT - BLOOD CULTURE IDENTIFICATION (BCID)  Brenda Middleton is an 60 y.o. female who presented to [facility] on 03/15/2021 with a chief complaint of shortness of breath s/p first chemotherapy 4/11 for metastatic lung cancer.   Assessment: 4/12 blood cultures with GPC in 1 of 4 bottles.  BCID detected MRSE.  She is currently on antibiotic for pneumonia.  No intravascular devices  Name of physician (or Provider) Contacted: Dr. Broadus John  Current antibiotics: cefepime, vancomycin stopped  Changes to prescribed antibiotics recommended:  Patient is on recommended antibiotics - No changes needed, monitor off vancomycin for suspected contaminant  Results for orders placed or performed during the hospital encounter of 03/15/21  Blood Culture ID Panel (Reflexed) (Collected: 03/15/2021 11:09 AM)  Result Value Ref Range   Enterococcus faecalis NOT DETECTED NOT DETECTED   Enterococcus Faecium NOT DETECTED NOT DETECTED   Listeria monocytogenes NOT DETECTED NOT DETECTED   Staphylococcus species DETECTED (A) NOT DETECTED   Staphylococcus aureus (BCID) NOT DETECTED NOT DETECTED   Staphylococcus epidermidis DETECTED (A) NOT DETECTED   Staphylococcus lugdunensis NOT DETECTED NOT DETECTED   Streptococcus species NOT DETECTED NOT DETECTED   Streptococcus agalactiae NOT DETECTED NOT DETECTED   Streptococcus pneumoniae NOT DETECTED NOT DETECTED   Streptococcus pyogenes NOT DETECTED NOT DETECTED   A.calcoaceticus-baumannii NOT DETECTED NOT DETECTED   Bacteroides fragilis NOT DETECTED NOT DETECTED   Enterobacterales NOT DETECTED NOT DETECTED   Enterobacter cloacae complex NOT DETECTED NOT DETECTED   Escherichia coli NOT DETECTED NOT DETECTED   Klebsiella aerogenes NOT DETECTED NOT DETECTED   Klebsiella oxytoca NOT DETECTED NOT DETECTED   Klebsiella pneumoniae NOT DETECTED NOT DETECTED   Proteus species NOT DETECTED NOT DETECTED   Salmonella species NOT  DETECTED NOT DETECTED   Serratia marcescens NOT DETECTED NOT DETECTED   Haemophilus influenzae NOT DETECTED NOT DETECTED   Neisseria meningitidis NOT DETECTED NOT DETECTED   Pseudomonas aeruginosa NOT DETECTED NOT DETECTED   Stenotrophomonas maltophilia NOT DETECTED NOT DETECTED   Candida albicans NOT DETECTED NOT DETECTED   Candida auris NOT DETECTED NOT DETECTED   Candida glabrata NOT DETECTED NOT DETECTED   Candida krusei NOT DETECTED NOT DETECTED   Candida parapsilosis NOT DETECTED NOT DETECTED   Candida tropicalis NOT DETECTED NOT DETECTED   Cryptococcus neoformans/gattii NOT DETECTED NOT DETECTED   Methicillin resistance mecA/C DETECTED (A) NOT DETECTED    Doreene Eland, PharmD, BCPS.   Work Cell: [phone] 03/16/2021 11:31 AM

## 2021-03-16 NOTE — Progress Notes (Signed)
Patient agitated this morning and verbalizing frustration and readiness to go home. Patient cursing at staff, demanding to be discharged but refusing to discuss any concerns with staff. She has stated multiple times "it doesn't matter, I'm dying anyway". Unwilling to participate in care and refusing to allow staff to complete full assessments. Dr. Broadus John notified, and come to bedside to assess patient. Intermittently refusing to wear her nasal cannula, will start to desat in the 80s on RA.

## 2021-03-17 ENCOUNTER — Inpatient Hospital Stay
Admission: EM | Admit: 2021-03-17 | Discharge: 2021-03-22 | DRG: 180 | Disposition: A | Discharge status: Hospice | Payer: Medicaid Other | Attending: Internal Medicine | Admitting: Internal Medicine

## 2021-03-17 ENCOUNTER — Encounter: Payer: Self-pay | Admitting: Radiology

## 2021-03-17 ENCOUNTER — Other Ambulatory Visit: Payer: Self-pay

## 2021-03-17 ENCOUNTER — Inpatient Hospital Stay: Admit: 2021-03-17 | Payer: Medicaid Other

## 2021-03-17 ENCOUNTER — Emergency Department: Payer: Medicaid Other

## 2021-03-17 DIAGNOSIS — Z801 Family history of malignant neoplasm of trachea, bronchus and lung: Secondary | ICD-10-CM

## 2021-03-17 DIAGNOSIS — D6869 Other thrombophilia: Secondary | ICD-10-CM | POA: Diagnosis present

## 2021-03-17 DIAGNOSIS — I2699 Other pulmonary embolism without acute cor pulmonale: Secondary | ICD-10-CM | POA: Diagnosis present

## 2021-03-17 DIAGNOSIS — T380X5A Adverse effect of glucocorticoids and synthetic analogues, initial encounter: Secondary | ICD-10-CM | POA: Diagnosis not present

## 2021-03-17 DIAGNOSIS — Z7401 Bed confinement status: Secondary | ICD-10-CM

## 2021-03-17 DIAGNOSIS — C7931 Secondary malignant neoplasm of brain: Secondary | ICD-10-CM | POA: Diagnosis present

## 2021-03-17 DIAGNOSIS — G629 Polyneuropathy, unspecified: Secondary | ICD-10-CM | POA: Diagnosis present

## 2021-03-17 DIAGNOSIS — D72829 Elevated white blood cell count, unspecified: Secondary | ICD-10-CM

## 2021-03-17 DIAGNOSIS — R54 Age-related physical debility: Secondary | ICD-10-CM | POA: Diagnosis present

## 2021-03-17 DIAGNOSIS — J189 Pneumonia, unspecified organism: Secondary | ICD-10-CM | POA: Diagnosis present

## 2021-03-17 DIAGNOSIS — F419 Anxiety disorder, unspecified: Secondary | ICD-10-CM | POA: Diagnosis present

## 2021-03-17 DIAGNOSIS — Z20822 Contact with and (suspected) exposure to covid-19: Secondary | ICD-10-CM | POA: Diagnosis present

## 2021-03-17 DIAGNOSIS — J9601 Acute respiratory failure with hypoxia: Secondary | ICD-10-CM | POA: Diagnosis present

## 2021-03-17 DIAGNOSIS — F32A Depression, unspecified: Secondary | ICD-10-CM | POA: Diagnosis present

## 2021-03-17 DIAGNOSIS — G893 Neoplasm related pain (acute) (chronic): Secondary | ICD-10-CM | POA: Diagnosis present

## 2021-03-17 DIAGNOSIS — N280 Ischemia and infarction of kidney: Secondary | ICD-10-CM

## 2021-03-17 DIAGNOSIS — T451X5A Adverse effect of antineoplastic and immunosuppressive drugs, initial encounter: Secondary | ICD-10-CM | POA: Diagnosis present

## 2021-03-17 DIAGNOSIS — Z66 Do not resuscitate: Secondary | ICD-10-CM | POA: Diagnosis present

## 2021-03-17 DIAGNOSIS — Y929 Unspecified place or not applicable: Secondary | ICD-10-CM

## 2021-03-17 DIAGNOSIS — R6520 Severe sepsis without septic shock: Secondary | ICD-10-CM | POA: Diagnosis not present

## 2021-03-17 DIAGNOSIS — J440 Chronic obstructive pulmonary disease with acute lower respiratory infection: Secondary | ICD-10-CM | POA: Diagnosis present

## 2021-03-17 DIAGNOSIS — D6959 Other secondary thrombocytopenia: Secondary | ICD-10-CM | POA: Diagnosis present

## 2021-03-17 DIAGNOSIS — Z7951 Long term (current) use of inhaled steroids: Secondary | ICD-10-CM

## 2021-03-17 DIAGNOSIS — Z880 Allergy status to penicillin: Secondary | ICD-10-CM

## 2021-03-17 DIAGNOSIS — I10 Essential (primary) hypertension: Secondary | ICD-10-CM | POA: Diagnosis present

## 2021-03-17 DIAGNOSIS — Z87891 Personal history of nicotine dependence: Secondary | ICD-10-CM

## 2021-03-17 DIAGNOSIS — Z803 Family history of malignant neoplasm of breast: Secondary | ICD-10-CM

## 2021-03-17 DIAGNOSIS — B182 Chronic viral hepatitis C: Secondary | ICD-10-CM | POA: Diagnosis present

## 2021-03-17 DIAGNOSIS — D630 Anemia in neoplastic disease: Secondary | ICD-10-CM | POA: Diagnosis present

## 2021-03-17 DIAGNOSIS — L89152 Pressure ulcer of sacral region, stage 2: Secondary | ICD-10-CM | POA: Diagnosis present

## 2021-03-17 DIAGNOSIS — Y92239 Unspecified place in hospital as the place of occurrence of the external cause: Secondary | ICD-10-CM | POA: Diagnosis not present

## 2021-03-17 DIAGNOSIS — C342 Malignant neoplasm of middle lobe, bronchus or lung: Principal | ICD-10-CM | POA: Diagnosis present

## 2021-03-17 DIAGNOSIS — Z823 Family history of stroke: Secondary | ICD-10-CM

## 2021-03-17 DIAGNOSIS — C7951 Secondary malignant neoplasm of bone: Secondary | ICD-10-CM | POA: Diagnosis present

## 2021-03-17 DIAGNOSIS — R748 Abnormal levels of other serum enzymes: Secondary | ICD-10-CM | POA: Diagnosis present

## 2021-03-17 DIAGNOSIS — C3490 Malignant neoplasm of unspecified part of unspecified bronchus or lung: Principal | ICD-10-CM

## 2021-03-17 DIAGNOSIS — A419 Sepsis, unspecified organism: Secondary | ICD-10-CM | POA: Diagnosis not present

## 2021-03-17 DIAGNOSIS — C787 Secondary malignant neoplasm of liver and intrahepatic bile duct: Secondary | ICD-10-CM | POA: Diagnosis present

## 2021-03-17 DIAGNOSIS — D65 Disseminated intravascular coagulation [defibrination syndrome]: Secondary | ICD-10-CM | POA: Diagnosis present

## 2021-03-17 DIAGNOSIS — I2609 Other pulmonary embolism with acute cor pulmonale: Secondary | ICD-10-CM

## 2021-03-17 DIAGNOSIS — Z515 Encounter for palliative care: Secondary | ICD-10-CM

## 2021-03-17 LAB — APTT: aPTT: 29 seconds (ref 24–36)

## 2021-03-17 LAB — BASIC METABOLIC PANEL
Anion gap: 8 (ref 5–15)
BUN: 24 mg/dL — ABNORMAL HIGH (ref 6–20)
CO2: 23 mmol/L (ref 22–32)
Calcium: 7.8 mg/dL — ABNORMAL LOW (ref 8.9–10.3)
Chloride: 108 mmol/L (ref 98–111)
Creatinine, Ser: 0.61 mg/dL (ref 0.44–1.00)
GFR, Estimated: >60 mL/min (ref >60)
Glucose, Bld: 157 mg/dL — ABNORMAL HIGH (ref 70–99)
Potassium: 4.2 mmol/L (ref 3.5–5.1)
Sodium: 139 mmol/L (ref 135–145)

## 2021-03-17 LAB — CBC WITH DIFFERENTIAL/PLATELET
Abs Immature Granulocytes: 0.08 10*3/uL — ABNORMAL HIGH (ref 0.00–0.07)
Basophils Absolute: 0 10*3/uL (ref 0.0–0.1)
Basophils Relative: 0 %
Eosinophils Absolute: 0.4 10*3/uL (ref 0.0–0.5)
Eosinophils Relative: 3 %
HCT: 26.6 % — ABNORMAL LOW (ref 36.0–46.0)
Hemoglobin: 8.8 g/dL — ABNORMAL LOW (ref 12.0–15.0)
Immature Granulocytes: 1 %
Lymphocytes Relative: 6 %
Lymphs Abs: 1 10*3/uL (ref 0.7–4.0)
MCH: 27.7 pg (ref 26.0–34.0)
MCHC: 33.1 g/dL (ref 30.0–36.0)
MCV: 83.6 fL (ref 80.0–100.0)
Monocytes Absolute: 0 10*3/uL — ABNORMAL LOW (ref 0.1–1.0)
Monocytes Relative: 0 %
Neutro Abs: 14.6 10*3/uL — ABNORMAL HIGH (ref 1.7–7.7)
Neutrophils Relative %: 90 %
Platelets: 63 10*3/uL — ABNORMAL LOW (ref 150–400)
RBC: 3.18 MIL/uL — ABNORMAL LOW (ref 3.87–5.11)
RDW: 14.6 % (ref 11.5–15.5)
WBC: 16.2 10*3/uL — ABNORMAL HIGH (ref 4.0–10.5)
nRBC: 0 % (ref 0.0–0.2)

## 2021-03-17 LAB — CBC
HCT: 24.8 % — ABNORMAL LOW (ref 36.0–46.0)
Hemoglobin: 8.2 g/dL — ABNORMAL LOW (ref 12.0–15.0)
MCH: 27.6 pg (ref 26.0–34.0)
MCHC: 33.1 g/dL (ref 30.0–36.0)
MCV: 83.5 fL (ref 80.0–100.0)
Platelets: 75 10*3/uL — ABNORMAL LOW (ref 150–400)
RBC: 2.97 MIL/uL — ABNORMAL LOW (ref 3.87–5.11)
RDW: 14.9 % (ref 11.5–15.5)
WBC: 15.8 10*3/uL — ABNORMAL HIGH (ref 4.0–10.5)
nRBC: 0 % (ref 0.0–0.2)

## 2021-03-17 LAB — COMPREHENSIVE METABOLIC PANEL
ALT: 52 U/L — ABNORMAL HIGH (ref 0–44)
AST: 89 U/L — ABNORMAL HIGH (ref 15–41)
Albumin: 2.3 g/dL — ABNORMAL LOW (ref 3.5–5.0)
Alkaline Phosphatase: 295 U/L — ABNORMAL HIGH (ref 38–126)
Anion gap: 10 (ref 5–15)
BUN: 21 mg/dL — ABNORMAL HIGH (ref 6–20)
CO2: 22 mmol/L (ref 22–32)
Calcium: 7.9 mg/dL — ABNORMAL LOW (ref 8.9–10.3)
Chloride: 103 mmol/L (ref 98–111)
Creatinine, Ser: 0.7 mg/dL (ref 0.44–1.00)
GFR, Estimated: >60 mL/min (ref >60)
Glucose, Bld: 82 mg/dL (ref 70–99)
Potassium: 4 mmol/L (ref 3.5–5.1)
Sodium: 135 mmol/L (ref 135–145)
Total Bilirubin: 1 mg/dL (ref 0.3–1.2)
Total Protein: 6 g/dL — ABNORMAL LOW (ref 6.5–8.1)

## 2021-03-17 LAB — CULTURE, BLOOD (ROUTINE X 2)

## 2021-03-17 LAB — PROTIME-INR
INR: 1.2 (ref 0.8–1.2)
Prothrombin Time: 15.2 seconds (ref 11.4–15.2)

## 2021-03-17 LAB — TROPONIN I (HIGH SENSITIVITY)
Troponin I (High Sensitivity): 2813 ng/L (ref <18)
Troponin I (High Sensitivity): 3488 ng/L (ref <18)

## 2021-03-17 LAB — PROCALCITONIN: Procalcitonin: 0.44 ng/mL

## 2021-03-17 LAB — LIPASE, BLOOD: Lipase: 533 U/L — ABNORMAL HIGH (ref 11–51)

## 2021-03-17 MED ORDER — MORPHINE SULFATE (PF) 2 MG/ML IV SOLN
2.0000 mg | Freq: Once | INTRAVENOUS | Status: DC
  Filled 2021-03-17: qty 1

## 2021-03-17 MED ORDER — CLONAZEPAM 0.5 MG PO TABS: 0.5000 mg | ORAL_TABLET | Freq: Two times a day (BID) | ORAL | 0 refills | Status: DC | PRN

## 2021-03-17 MED ORDER — FENTANYL 25 MCG/HR TD PT72
1.0000 | MEDICATED_PATCH | TRANSDERMAL | Status: DC
  Administered 2021-03-17: 1 via TRANSDERMAL
  Filled 2021-03-17: qty 1

## 2021-03-17 MED ORDER — SENNOSIDES-DOCUSATE SODIUM 8.6-50 MG PO TABS
1.0000 | ORAL_TABLET | Freq: Two times a day (BID) | ORAL | Status: DC
  Filled 2021-03-17: qty 1

## 2021-03-17 MED ORDER — METOPROLOL TARTRATE 25 MG PO TABS
12.5000 mg | ORAL_TABLET | Freq: Two times a day (BID) | ORAL | Status: DC
  Administered 2021-03-17: 12.5 mg via ORAL
  Filled 2021-03-17: qty 1

## 2021-03-17 MED ORDER — SODIUM CHLORIDE 0.9 % IV SOLN
2.0000 g | Freq: Once | INTRAVENOUS | Status: AC
  Administered 2021-03-18: 2 g via INTRAVENOUS
  Filled 2021-03-17: qty 2

## 2021-03-17 MED ORDER — SODIUM CHLORIDE 0.9 % IV SOLN
500.0000 mg | Freq: Once | INTRAVENOUS | Status: AC
  Administered 2021-03-18: 500 mg via INTRAVENOUS
  Filled 2021-03-17: qty 500

## 2021-03-17 MED ORDER — HEPARIN BOLUS VIA INFUSION
3000.0000 [IU] | Freq: Once | INTRAVENOUS | Status: DC
  Filled 2021-03-17: qty 3000

## 2021-03-17 MED ORDER — IOHEXOL 350 MG/ML SOLN
75.0000 mL | Freq: Once | INTRAVENOUS | Status: AC | PRN
  Administered 2021-03-17: 75 mL via INTRAVENOUS

## 2021-03-17 MED ORDER — POLYETHYLENE GLYCOL 3350 17 G PO PACK
17.0000 g | PACK | Freq: Every day | ORAL | Status: DC
  Filled 2021-03-17: qty 1

## 2021-03-17 MED ORDER — LACTATED RINGERS IV BOLUS
1000.0000 mL | Freq: Once | INTRAVENOUS | Status: AC
  Administered 2021-03-17: 1000 mL via INTRAVENOUS

## 2021-03-17 MED ORDER — GABAPENTIN 600 MG PO TABS: 300.0000 mg | ORAL_TABLET | Freq: Every day | ORAL | Status: AC

## 2021-03-17 MED ORDER — QUETIAPINE FUMARATE 400 MG PO TABS: 200.0000 mg | ORAL_TABLET | Freq: Every day | ORAL | Status: AC

## 2021-03-17 MED ORDER — FUROSEMIDE 20 MG PO TABS: 20.0000 mg | ORAL_TABLET | Freq: Every day | ORAL | Status: DC

## 2021-03-17 MED ORDER — ONDANSETRON HCL 4 MG/2ML IJ SOLN
4.0000 mg | Freq: Once | INTRAMUSCULAR | Status: AC
  Administered 2021-03-17: 4 mg via INTRAVENOUS
  Filled 2021-03-17: qty 2

## 2021-03-17 MED ORDER — FENTANYL 25 MCG/HR TD PT72: 1.0000 | MEDICATED_PATCH | TRANSDERMAL | 0 refills | Status: AC

## 2021-03-17 MED ORDER — HEPARIN (PORCINE) 25000 UT/250ML-% IV SOLN: 600.0000 [IU]/h | INTRAVENOUS | Status: DC

## 2021-03-17 MED ORDER — POTASSIUM CHLORIDE CRYS ER 20 MEQ PO TBCR: 20.0000 meq | EXTENDED_RELEASE_TABLET | Freq: Every day | ORAL | 0 refills | Status: DC

## 2021-03-17 MED ORDER — FLEET ENEMA 7-19 GM/118ML RE ENEM
1.0000 | ENEMA | Freq: Once | RECTAL | Status: AC
  Administered 2021-03-17: 1 via RECTAL

## 2021-03-17 MED ORDER — SODIUM CHLORIDE 0.9 % IV SOLN
12.5000 mg | Freq: Four times a day (QID) | INTRAVENOUS | Status: DC | PRN
  Administered 2021-03-17: 12.5 mg via INTRAVENOUS
  Filled 2021-03-17: qty 0.5

## 2021-03-17 MED ORDER — HYDROMORPHONE HCL 1 MG/ML IJ SOLN
INTRAMUSCULAR | Status: AC
  Administered 2021-03-17: 1 mg
  Filled 2021-03-17: qty 1

## 2021-03-17 MED ORDER — OXYCODONE-ACETAMINOPHEN 5-325 MG PO TABS: 1.0000 | ORAL_TABLET | Freq: Four times a day (QID) | ORAL | 0 refills | Status: DC | PRN

## 2021-03-17 NOTE — Plan of Care (Signed)
  Problem: Education: Goal: Knowledge of General Education information will improve Description: Including pain rating scale, medication(s)/side effects and non-pharmacologic comfort measures 03/17/2021 1617 by Algis Greenhouse, RN Outcome: Completed/Met 03/17/2021 1613 by Algis Greenhouse, RN Outcome: Adequate for Discharge   Problem: Health Behavior/Discharge Planning: Goal: Ability to manage health-related needs will improve 03/17/2021 1617 by Algis Greenhouse, RN Outcome: Completed/Met 03/17/2021 1613 by Algis Greenhouse, RN Outcome: Adequate for Discharge   Problem: Clinical Measurements: Goal: Ability to maintain clinical measurements within normal limits will improve 03/17/2021 1617 by Algis Greenhouse, RN Outcome: Completed/Met 03/17/2021 1613 by Algis Greenhouse, RN Outcome: Adequate for Discharge Goal: Will remain free from infection 03/17/2021 1617 by Algis Greenhouse, RN Outcome: Completed/Met 03/17/2021 1613 by Algis Greenhouse, RN Outcome: Adequate for Discharge Goal: Diagnostic test results will improve 03/17/2021 1617 by Algis Greenhouse, RN Outcome: Completed/Met 03/17/2021 1613 by Algis Greenhouse, RN Outcome: Adequate for Discharge Goal: Respiratory complications will improve 03/17/2021 1617 by Algis Greenhouse, RN Outcome: Completed/Met 03/17/2021 1613 by Algis Greenhouse, RN Outcome: Adequate for Discharge Goal: Cardiovascular complication will be avoided 03/17/2021 1617 by Algis Greenhouse, RN Outcome: Completed/Met 03/17/2021 1613 by Algis Greenhouse, RN Outcome: Adequate for Discharge   Problem: Activity: Goal: Risk for activity intolerance will decrease 03/17/2021 1617 by Algis Greenhouse, RN Outcome: Completed/Met 03/17/2021 1613 by Algis Greenhouse, RN Outcome: Adequate for Discharge   Problem: Nutrition: Goal: Adequate nutrition will be maintained 03/17/2021 1617 by Algis Greenhouse, RN Outcome: Completed/Met 03/17/2021 1613 by Algis Greenhouse, RN Outcome: Adequate for Discharge    Problem: Coping: Goal: Level of anxiety will decrease 03/17/2021 1617 by Algis Greenhouse, RN Outcome: Completed/Met 03/17/2021 1613 by Algis Greenhouse, RN Outcome: Adequate for Discharge   Problem: Elimination: Goal: Will not experience complications related to bowel motility 03/17/2021 1617 by Algis Greenhouse, RN Outcome: Completed/Met 03/17/2021 1613 by Algis Greenhouse, RN Outcome: Adequate for Discharge Goal: Will not experience complications related to urinary retention 03/17/2021 1617 by Algis Greenhouse, RN Outcome: Completed/Met 03/17/2021 1613 by Algis Greenhouse, RN Outcome: Adequate for Discharge   Problem: Pain Managment: Goal: General experience of comfort will improve 03/17/2021 1617 by Algis Greenhouse, RN Outcome: Completed/Met 03/17/2021 1613 by Algis Greenhouse, RN Outcome: Adequate for Discharge   Problem: Safety: Goal: Ability to remain free from injury will improve 03/17/2021 1617 by Algis Greenhouse, RN Outcome: Completed/Met 03/17/2021 1613 by Algis Greenhouse, RN Outcome: Adequate for Discharge   Problem: Skin Integrity: Goal: Risk for impaired skin integrity will decrease 03/17/2021 1617 by Algis Greenhouse, RN Outcome: Completed/Met 03/17/2021 1613 by Algis Greenhouse, RN Outcome: Adequate for Discharge

## 2021-03-17 NOTE — Progress Notes (Signed)
Patient remains extremely anxious, repeatedly removing nasal cannula. Educated patient on the importance of leaving the nasal cannula in place. Notified Dr. Broadus John of patient's anxiety via secure chat.

## 2021-03-17 NOTE — Evaluation (Signed)
Physical Therapy Evaluation Patient Details Name: Brenda Middleton MRN: [id number] DOB: 1961/05/27 Today's Date: 03/17/2021   History of Present Illness  Patient is a 60 y.o. female with multiple medical problems including anxiety, nicotine dependence, hypertension, depression, and chronic low back pain.  Patient was recently diagnosed with stage IV non-small cell lung cancer.  MRI of the brain revealed destructive mass in the occipital bone bilaterally extending into the posterior fossa with mass-effect to the left cerebellum.  CT of the chest abdomen and pelvis revealed a large mass in the right middle lobe with widespread metastases involving lymphadenopathy in the chest, multiple pulmonary nodules, bone lesions, and liver lesions.  She was found to have a pathologic fracture of L2.  Recent hospitalization for intractable low back pain. Now readmitted with  AMS presumably due to overuse of pain medications    Clinical Impression  Patient is known to this therapist from previous hospital stay and at that time was ambulating in hallway without assistive device on room air. Patient reports she continues to be independent with mobility at ADLs prior to this admission and lives at home with here sister who is not at the bedside during evaluation.   Patient is adamant about going home and states several times that she wants to leave today. Patient required assistance for bed mobility with extra time required due to fatigue with minimal activity. Patient has difficulty just maintaining upright sitting and frequently flexes trunk forward fully in sitting position due to fatigue. Patient was unable to stand with assistance when attempted x 3 bouts due to generalized weakness and fatigue with activity. Heart rate up to 135 with exertional activity and Sp02 92% on 4 L02. Patient was able to perform lateral scooting with some weight bearing through BLE with minimal assistance, again with activity tolerance limited  by fatigue. Patient would likely benefit from SNF for rehab, however is very vocal and adamant about going home. Patient will likely need assistance for mobility for safety from sister/family and HHPT recommended at a minimum. PT will continue to follow while in the hospital in attempts to maximize independence in preparation for home discharge.     Follow Up Recommendations Supervision/Assistance - 24 hour (Patient is adamant about going home at discharge. Recommend HHPT and family assistance at a minimum. If family is unable to assist at home, patient will need higher level of care at [facility])    Equipment Recommendations  Rolling walker with 5" wheels;3in1 (PT)    Recommendations for Other Services       Precautions / Restrictions Precautions Precautions: Fall Restrictions Weight Bearing Restrictions: No      Mobility  Bed Mobility Overal bed mobility: Needs Assistance Bed Mobility: Supine to Sit;Sit to Supine     Supine to sit: Min assist Sit to supine: Min assist   General bed mobility comments: assistance required for trunk support. verbal cues for technique and increased time required to complete tasks due to fatigue.    Transfers Overall transfer level: Needs assistance   Transfers: Lateral/Scoot Transfers          Lateral/Scoot Transfers: Min assist General transfer comment: Patient attempt sit to stand transfer x 3 bouts and was unable to stand with assistance secondary to fatigue and generalized weakness. Despite rest breaks between bouts of standing efforts, patient was unable to stand. Heart rate up to 135bpm with exertional activity. Patient was able to scoot x 2 bouts  towards left side in sitting position with minimal assistance.  Ambulation/Gait             General Gait Details: unable to progress to ambulation as patient unable to stand at this time  Stairs            Wheelchair Mobility    Modified Rankin (Stroke Patients Only)        Balance Overall balance assessment: Needs assistance Sitting-balance support: Feet supported;Bilateral upper extremity supported Sitting balance-Leahy Scale: Fair Sitting balance - Comments: patient relying on UE support to maintain sitting position                                     Pertinent Vitals/Pain Pain Assessment: Faces Faces Pain Scale: Hurts a little bit Pain Location: vague complaints bout back pain Pain Descriptors / Indicators: Discomfort Pain Intervention(s): Limited activity within patient's tolerance    Home Living Family/patient expects to be discharged to:: Private residence Living Arrangements: Other (Comment);Other relatives (sister) Available Help at Discharge: Family;Available 24 hours/day Type of Home: Apartment Home Access: Level entry     Home Layout: One level Home Equipment: None      Prior Function Level of Independence: Independent               Hand Dominance        Extremity/Trunk Assessment   Upper Extremity Assessment Upper Extremity Assessment: Generalized weakness    Lower Extremity Assessment Lower Extremity Assessment: Generalized weakness       Communication   Communication: No difficulties  Cognition Arousal/Alertness: Lethargic Behavior During Therapy: WFL for tasks assessed/performed Overall Cognitive Status: Impaired/Different from baseline Area of Impairment: Orientation;Following commands;Safety/judgement                 Orientation Level: Disoriented to;Situation;Time     Following Commands: Follows one step commands with increased time Safety/Judgement: Decreased awareness of safety;Decreased awareness of deficits     General Comments: patient is able to follow single step commands with increased time and encouragement      General Comments      Exercises     Assessment/Plan    PT Assessment Patient needs continued PT services  PT Problem List Decreased  strength;Decreased balance;Decreased activity tolerance;Decreased mobility;Decreased cognition;Decreased knowledge of use of DME;Decreased safety awareness;Decreased knowledge of precautions;Cardiopulmonary status limiting activity       PT Treatment Interventions DME instruction;Gait training;Functional mobility training;Therapeutic activities;Therapeutic exercise;Balance training;Neuromuscular re-education;Patient/family education    PT Goals (Current goals can be found in the Care Plan section)  Acute Rehab PT Goals Patient Stated Goal: to go home today PT Goal Formulation: With patient Time For Goal Achievement: 03/31/21 Potential to Achieve Goals: Fair    Frequency Min 2X/week   Barriers to discharge        Co-evaluation               AM-PAC PT "6 Clicks" Mobility  Outcome Measure Help needed turning from your back to your side while in a flat bed without using bedrails?: A Little Help needed moving from lying on your back to sitting on the side of a flat bed without using bedrails?: A Little Help needed moving to and from a bed to a chair (including a wheelchair)?: Total Help needed standing up from a chair using your arms (e.g., wheelchair or bedside chair)?: Total Help needed to walk in hospital room?: Total Help needed climbing 3-5 steps with a railing? : Total 6 Click Score:  10    End of Session Equipment Utilized During Treatment: Oxygen Activity Tolerance: Patient limited by fatigue Patient left: in bed;with call bell/phone within reach;with bed alarm set Nurse Communication: Mobility status PT Visit Diagnosis: Muscle weakness (generalized) (M62.81);Unsteadiness on feet (R26.81)    Time: 1120-1150 PT Time Calculation (min) (ACUTE ONLY): 30 min   Charges:   PT Evaluation $PT Eval Moderate Complexity: 1 Mod PT Treatments $Therapeutic Activity: 8-22 mins        Minna Merritts, PT, MPT  Percell Locus 03/17/2021, 12:07 PM

## 2021-03-17 NOTE — ED Notes (Signed)
Patient transported to CT 

## 2021-03-17 NOTE — ED Triage Notes (Signed)
Pt BIBA for lower abd pain after being given enema today. Pt d/c from ICU today for stage 4 metastatic lung cancer. BP 158/88. Hr 118. EMS gave 4mg  zofran. 98% 4L [place]. Baseline o2 on arrival.

## 2021-03-17 NOTE — Progress Notes (Signed)
Patient has removed telemetry monitoring and put on personal clothing, stating she is leaving. Refusing to let staff replace telemetry leads.

## 2021-03-17 NOTE — Progress Notes (Signed)
[place] [facility] [employer] [employer] [facility]) Hospital Liaison RN note:  Received new referral for [facility] out patient palliative program to follow post discharge from Altha Harm, NP. [facility] Referral has patient information and Liaison will follow for disposition. Plan is to discharge home when medically ready. Teresita Maxey, TOC is aware.  Thank you for this referral.  Zandra Abts, RN [facility] Liaison [phone]

## 2021-03-17 NOTE — Progress Notes (Signed)
Turned off oxygen prior to ambulating patient. After 30 seconds, patient spO2 decreased to 86% on room air while lying in bed. Placed back on 4LNC. Patient spO2 returned to 96%, in no respiratory distress. Unable to ambulate at this time.

## 2021-03-17 NOTE — Progress Notes (Signed)
Patient's pain is unmanaged with current PRN orders and she is requesting to leave AMA. Message sent to Dr. Broadus John via secure chat.

## 2021-03-17 NOTE — Consult Note (Addendum)
[place] for Heparin Indication: pulmonary embolus  Allergies  Allergen Reactions  . Penicillin G Hives    Other reaction(s): HIVES Other reaction(s): HIVES  . Penicillin V Itching  . Penicillins     Patient Measurements: Height: 5\' 6"  (167.6 cm) Weight: 50.7 kg (111 lb 12.4 oz) IBW/kg (Calculated) : 59.3 Heparin Dosing Weight: 50.7 kg  Vital Signs: Temp: 98.4 F (36.9 C) (04/14 2048) Temp Source: Oral (04/14 2048) BP: 159/115 (04/14 2046) Pulse Rate: 130 (04/14 2048)  Labs: Recent Labs    03/15/21 1109 03/15/21 1554 03/16/21 0314 03/17/21 0321 03/17/21 2051  HGB 8.5*  --  7.6* 8.2* 8.8*  HCT 25.9*  --  23.6* 24.8* 26.6*  PLT 97*  --  99* 75* 63*  LABPROT  --   --  14.7  --   --   INR  --   --  1.2  --   --   CREATININE 0.86  --  0.72 0.61 0.70  TROPONINIHS 65* 65*  --   --  2,813*    Estimated Creatinine Clearance: 59.9 mL/min (by C-G formula based on SCr of 0.7 mg/dL).   Medical History: Past Medical History:  Diagnosis Date  . Anxiety   . Benign neoplasm of cervix uteri   . Chronic hepatitis C without mention of hepatic coma   . Chronic low back pain 08/26/2014  . Constipation   . Depressive disorder   . Displacement of cervical intervertebral disc   . Hypertensive disorder   . Iron deficiency anemia due to chronic blood loss 09/12/2019  . Palpitations   . Prolapsed cervical intervertebral disc     Medications:  (Not in a hospital admission)  Scheduled:   Infusions:  . azithromycin (ZITHROMAX) 500 MG IVPB (Vial-Mate [employer])    . ceFEPime (MAXIPIME) IV     PRN:  Anti-infectives (From admission, onward)   Start     Dose/Rate Route Frequency Ordered Stop   03/17/21 2215  ceFEPIme (MAXIPIME) 2 g in sodium chloride 0.9 % 100 mL IVPB        2 g 200 mL/hr over 30 Minutes Intravenous  Once 03/17/21 2212     03/17/21 2215  azithromycin (ZITHROMAX) 500 mg in sodium chloride 0.9 % 250 mL IVPB        500  mg 250 mL/hr over 60 Minutes Intravenous  Once 03/17/21 2212        Assessment: Pharmacy consulted to start heparin for ACS. No note of DOAC PTA. Baseline labs ordered. Plt are low (63), continue to monitor. Chest Ct: Acute pulmonary emboli within multiple bilateral lobar and segmental branches. No CT evidence of acute right heart strain  Goal of Therapy:  Heparin level 0.3-0.7 units/ml Monitor platelets by anticoagulation protocol: Yes   Plan:  Give 4000 units bolus x 1 Start heparin infusion at 800 units/hr Check anti-Xa level in 6 hours and daily while on heparin Continue to monitor H&H and platelets  Oswald Hillock, PharmD, BCPS 03/17/2021,10:22 PM

## 2021-03-17 NOTE — Plan of Care (Signed)
Patient alert and oriented x 4, in no acute distress, and states desire to be discharged. Discharge education given to patient. Patient able to verbalize new medication plan. Discharge packet placed in envelope and given to patient's sister. Oxygen, bedside commode and rolling walker functions demonstrated for patient and sister, all questions answered. Peripheral IV removed.   Problem: Education: Goal: Knowledge of General Education information will improve Description: Including pain rating scale, medication(s)/side effects and non-pharmacologic comfort measures Outcome: Adequate for Discharge   Problem: Health Behavior/Discharge Planning: Goal: Ability to manage health-related needs will improve Outcome: Adequate for Discharge   Problem: Clinical Measurements: Goal: Ability to maintain clinical measurements within normal limits will improve Outcome: Adequate for Discharge Goal: Will remain free from infection Outcome: Adequate for Discharge Goal: Diagnostic test results will improve Outcome: Adequate for Discharge Goal: Respiratory complications will improve Outcome: Adequate for Discharge Goal: Cardiovascular complication will be avoided Outcome: Adequate for Discharge   Problem: Activity: Goal: Risk for activity intolerance will decrease Outcome: Adequate for Discharge   Problem: Nutrition: Goal: Adequate nutrition will be maintained Outcome: Adequate for Discharge   Problem: Coping: Goal: Level of anxiety will decrease Outcome: Adequate for Discharge   Problem: Elimination: Goal: Will not experience complications related to bowel motility Outcome: Adequate for Discharge Goal: Will not experience complications related to urinary retention Outcome: Adequate for Discharge   Problem: Pain Managment: Goal: General experience of comfort will improve Outcome: Adequate for Discharge   Problem: Safety: Goal: Ability to remain free from injury will improve Outcome: Adequate  for Discharge   Problem: Skin Integrity: Goal: Risk for impaired skin integrity will decrease Outcome: Adequate for Discharge

## 2021-03-17 NOTE — Discharge Summary (Signed)
Physician Discharge Summary  Zamora Colton Mccu[place] QJJ:[id number] DOB: December 12, 1960 DOA: 03/15/2021  PCP: Remi Haggard, FNP  Admit date: 03/15/2021 Discharge date: 03/17/2021  Time spent: 35 minutes  Recommendations for Outpatient Follow-up:  1. Oncology Dr. Tasia Catchings in 1 to 2 weeks 2. Palliative care follow-up, very poor prognosis with high risk of quick rehospitalization, anticipate need for transition to hospice services in the near future 3. Home health PT OT, RN, aide, home O2   Discharge Diagnoses:  Acute encephalopathy Polypharmacy History of polysubstance abuse Widely metastatic adenocarcinoma of lung Brain metastasis   Iron deficiency anemia due to chronic blood loss   Depressive disorder   GERD (gastroesophageal reflux disease)   Postobstructive pneumonia   Malignant neoplasm of lung (HCC)   Toxic metabolic encephalopathy   Acute respiratory failure with hypoxia (HCC)   Thrombocytopenia (HCC)   Hypokalemia   Hypomagnesemia   Opiate overdose ([place])   Discharge Condition: Stable, guarded prognosis  Diet recommendation: Regular  Filed Weights   03/15/21 1119  Weight: 50.7 kg    History of present illness:   Chronically ill 60 year old female with widely metastatic non-small cell lung cancer, COPD, tobacco abuse, recently hospitalized for HCAP versus lymphangitic tumor spread treated with empiric antibiotics and discharged, subsequently started on Levaquin at the cancer center for possible postobstructive pneumonia, treated with cycle 1 day 1 of reduced dose carboplatin/Alimta on 4/11. -History of substance abuse, and metastasis related chronic pain, on fentanyl patch and as needed Percocet.  Was brought to the ED by her sister 4/12 with mental status change, in the emergency room she was noted to be hypoxic requiring oxygen supplementation along with metabolic encephalopathy started on Narcan and fentanyl discontinued. -Chest x-ray noted bilateral airspace opacities however  improved from prior  Hospital Course:  Chronically ill 60 year old female with widely metastatic non-small cell lung cancer, COPD, tobacco abuse, recently hospitalized for HCAP versus lymphangitic tumor spread treated with empiric antibiotics and discharged, subsequently started on Levaquin at the cancer center for possible postobstructive pneumonia, treated with cycle 1 day 1 of reduced dose carboplatin/Alimta on 4/11. -History of substance abuse, and metastasis related chronic pain, on fentanyl patch and as needed Percocet.  Was brought to the ED by her sister 4/12 with mental status change, in the emergency room she was noted to be hypoxic requiring oxygen supplementation along with metabolic encephalopathy started on Narcan and fentanyl discontinued. -Chest x-ray noted bilateral airspace opacities however improved from prior   Assessment & Plan:   Toxic encephalopathy Polypharmacy -Patient is on extensive regimen of sedating medicines including fentanyl patch, BuSpar, Neurontin (per sister was taking 1200 mg twice daily) instead of 300 daily, hydroxyzine, Percocet, Compazine, Phenergan, Seroquel etc -Mental status has improved, fentanyl patch discontinued on admission, restarted fentanyl patch at half dose 25 mcg daily, Percocet also resumed as she continues to have severe intensive pain throughout  -Remains on dexamethasone for brain metastasis -Caution with meds, discussed with sister who will now take charge of managing patient's medications -Clinically overall stable now but extremely weak and debilitated,, PT recommended supervision, patient is adamant about being discharged home as soon as possible -Set up with home health PT OT RN and aide -She has very poor insight, anticipate quick rehospitalization  Bibasilar infiltrates -Clinically do not suspect pneumonia recurrence, imaging shows improvement from prior x-rays -Was treated with IV vancomycin and cefepime on the day of  admission and subsequently discontinued -She has chronic leukocytosis from dexamethasone and reactive/malignancy which is overall improving  Widely metastatic adenocarcinoma of lung -Just received first dose of first cycle on 4/11 -Prognosis is very poor, patient has extremely poor insight, oncology following -Palliative medicine consulted, discussed poor prognosis is patient's sister who also is anxious about how to take care of this patient -Had palliative care meeting yesterday, now DNR, plan for palliative care follow-up after discharge, anticipate need for hospice in the near future, patient and her sister understand this, she is not agreeable to hospice right now -Home O2 set up at discharge  Hypokalemia Hypomagnesemia -Replaced  COPD Tobacco abuse -stable, nebs PRN  Anemia Thrombocytopenia -Secondary to cancer and chemotherapy   Discharge Exam: Vitals:   03/17/21 1344 03/17/21 1400  BP: (!) 159/98 (!) 178/89  Pulse: (!) 120 (!) 123  Resp:  20  Temp:    SpO2:  96%    General: Awake alert oriented to self and place, mild cognitive deficits Cardiovascular: S1-S2, regular rhythm, tachycardic Respiratory: Scattered basilar rhonchi  Discharge Instructions   Discharge Instructions    Ambulatory Referral to Palliative Care   Complete by: As directed    Diet general   Complete by: As directed    Discharge wound care:   Complete by: As directed    routine   Increase activity slowly   Complete by: As directed      Allergies as of 03/17/2021      Reactions   Penicillin G Hives   Other reaction(s): HIVES Other reaction(s): HIVES   Penicillin V Itching   Penicillins       Medication List    STOP taking these medications   fentaNYL 50 MCG/HR Commonly known as: DURAGESIC Replaced by: fentaNYL 25 MCG/HR   hydrOXYzine 25 MG tablet Commonly known as: ATARAX/VISTARIL   meloxicam 15 MG tablet Commonly known as: MOBIC   prochlorperazine 10 MG  tablet Commonly known as: COMPAZINE     TAKE these medications   albuterol 108 (90 Base) MCG/ACT inhaler Commonly known as: VENTOLIN HFA Inhale 2 puffs into the lungs every 4 (four) hours as needed for wheezing or shortness of breath.   budesonide-formoterol 80-4.5 MCG/ACT inhaler Commonly known as: SYMBICORT Inhale 2 puffs into the lungs 2 (two) times daily.   busPIRone 10 MG tablet Commonly known as: BUSPAR Take 1 tablet (10 mg total) by mouth 2 (two) times daily.   clonazePAM 0.5 MG tablet Commonly known as: KLONOPIN Take 1 tablet (0.5 mg total) by mouth 2 (two) times daily as needed (anxiety).   dexamethasone 4 MG tablet Commonly known as: DECADRON Take 1 tablet (4 mg total) by mouth 2 (two) times daily.   fentaNYL 25 MCG/HR Commonly known as: Brinckerhoff 1 patch onto the skin every 3 (three) days. Start taking on: March 20, 2021 Replaces: fentaNYL 50 MCG/HR   folic acid 1 MG tablet Commonly known as: FOLVITE Take 1 tablet (1 mg total) by mouth daily. Start 5-7 days before Alimta chemotherapy. Continue until 21 days after Alimta completed.   furosemide 20 MG tablet Commonly known as: LASIX Take 1 tablet (20 mg total) by mouth daily. What changed: when to take this   gabapentin 600 MG tablet Commonly known as: NEURONTIN Take 0.5 tablets (300 mg total) by mouth at bedtime. What changed:   how much to take  when to take this   guaiFENesin 600 MG 12 hr tablet Commonly known as: MUCINEX Take 2 tablets (1,200 mg total) by mouth 2 (two) times daily.   ipratropium-albuterol 0.5-2.5 (3) MG/3ML Soln Commonly known  as: DUONEB Take 3 mLs by nebulization every 6 (six) hours as needed.   lidocaine-prilocaine cream Commonly known as: EMLA Apply to affected area once What changed:   how much to take  how to take this  when to take this  reasons to take this   magic mouthwash Soln Take 5 mLs by mouth 3 (three) times daily as needed for mouth pain.    metoprolol tartrate 25 MG tablet Commonly known as: LOPRESSOR Take 0.5 tablets (12.5 mg total) by mouth 2 (two) times daily.   oxyCODONE-acetaminophen 5-325 MG tablet Commonly known as: PERCOCET/ROXICET Take 1-2 tablets by mouth every 6 (six) hours as needed for severe pain. What changed: when to take this   pantoprazole 40 MG tablet Commonly known as: PROTONIX Take 40 mg by mouth daily.   potassium chloride SA 20 MEQ tablet Commonly known as: KLOR-CON Take 1 tablet (20 mEq total) by mouth daily. What changed: how much to take   promethazine 12.5 MG tablet Commonly known as: PHENERGAN Take 1 tablet (12.5 mg total) by mouth every 6 (six) hours as needed for nausea or vomiting.   QUEtiapine 400 MG tablet Commonly known as: SEROQUEL Take 0.5 tablets (200 mg total) by mouth at bedtime. What changed: how much to take            Durable Medical Equipment  (From admission, onward)         Start     Ordered   03/17/21 1205  For home use only DME oxygen  Once       Question Answer Comment  Length of Need 6 Months   Mode or (Route) Nasal cannula   Liters per Minute 3   Frequency Continuous (stationary and portable oxygen unit needed)   Oxygen conserving device Yes   Oxygen delivery system Gas      03/17/21 1204           Discharge Care Instructions  (From admission, onward)         Start     Ordered   03/17/21 0000  Discharge wound care:       Comments: routine   03/17/21 1553         Allergies  Allergen Reactions  . Penicillin G Hives    Other reaction(s): HIVES Other reaction(s): HIVES  . Penicillin V Itching  . Penicillins     Follow-up Information    Remi Haggard, FNP. Schedule an appointment as soon as possible for a visit in 1 week(s).   Specialty: Family Medicine Contact information: [place] [id number] [phone]        Earlie Server, MD. Go in 1 week(s).   Specialty: Oncology Contact information: [place] [phone]                The results of significant diagnostics from this hospitalization (including imaging, microbiology, ancillary and laboratory) are listed below for reference.    Significant Diagnostic Studies: DG Chest 2 View  Result Date: 03/05/2021 CLINICAL DATA:  Shortness of breath EXAM: CHEST - 2 VIEW COMPARISON:  02/27/2021 FINDINGS: Heart is normal size. Right upper lobe mass again noted as seen on CT. Patchy bilateral airspace disease again noted, similar to prior study. No effusions or acute bony abnormality. IMPRESSION: Patchy bilateral airspace disease again noted, not significantly changed. Right upper lobe mass again seen. Electronically Signed   By: Rolm Baptise M.D.   On: 03/05/2021 21:42  CT CHEST W CONTRAST  Result Date: 03/06/2021 CLINICAL DATA:  Respiratory failure, shortness of breath EXAM: CT CHEST WITH CONTRAST TECHNIQUE: Multidetector CT imaging of the chest was performed during intravenous contrast administration. CONTRAST:  61mL OMNIPAQUE IOHEXOL 300 MG/ML  SOLN COMPARISON:  02/27/2021.  Chest x-ray 03/05/2021 FINDINGS: Cardiovascular: Heart is normal size. Coronary artery and aortic calcifications. No aneurysm. Mediastinum/Nodes: Bulky mediastinal and bilateral hilar adenopathy again noted, unchanged. Trachea is patent. Thyroid unremarkable. Lungs/Pleura: Ground-glass airspace opacities in the lower lobes have worsened since prior study, particularly in the left lower lobe. Ground-glass opacities in the upper lobes somewhat improved since prior study. Numerous bilateral pulmonary nodules are stable. Right upper lobe mass again noted and stable. No effusions. Upper Abdomen: Imaging into the upper abdomen demonstrates no acute findings. Musculoskeletal: Chest wall soft tissues are unremarkable. Multiple osseous metastases again noted, unchanged. IMPRESSION: Redemonstrated is extensive metastatic disease in the chest with numerous pulmonary  nodules, dominant right upper lobe mass, and bulky mediastinal/bilateral hilar adenopathy. Ground-glass opacities have increased in the lower lobes, left greater than right, likely infectious/inflammatory. Somewhat improvement in ground-glass opacities in the upper lobe since prior study. Stable osseous metastatic disease. Coronary artery disease. Aortic Atherosclerosis (ICD10-I70.0). Electronically Signed   By: Rolm Baptise M.D.   On: 03/06/2021 00:11   CT Angio Chest PE W and/or Wo Contrast  Result Date: 02/27/2021 CLINICAL DATA:  PE suspected, widely metastatic lung cancer EXAM: CT ANGIOGRAPHY CHEST WITH CONTRAST TECHNIQUE: Multidetector CT imaging of the chest was performed using the standard protocol during bolus administration of intravenous contrast. Multiplanar CT image reconstructions and MIPs were obtained to evaluate the vascular anatomy. CONTRAST:  76mL OMNIPAQUE IOHEXOL 350 MG/ML SOLN COMPARISON:  02/15/2021 FINDINGS: Cardiovascular: Satisfactory opacification of the pulmonary arteries to the segmental level. No evidence of pulmonary embolism. Cardiomegaly. Left and right coronary artery calcifications and stents. No pericardial effusion. Mediastinum/Nodes: Redemonstrated, very extensive bulky bilateral hilar, mediastinal, supraclavicular, and left axillary lymphadenopathy. Thyroid gland, trachea, and esophagus demonstrate no significant findings. Lungs/Pleura: Diffuse bilateral bronchial wall thickening. Spiculated right upper lobe mass as seen on prior examination (series 7, image 42). Numerous redemonstrated small pulmonary nodules throughout the lungs. There is new, extensive ground-glass airspace opacity throughout the lungs, somewhat geographic in the upper lobes (series 7, image 33), although somewhat more nodular appearing in the lower lungs (series 7, image 58). No pleural effusion or pneumothorax. Upper Abdomen: No acute abnormality. Multiple low-attenuation lesions of liver, better  assessed by prior dedicated of the abdomen. Musculoskeletal: No chest wall abnormality. Multiple osseous metastatic lesions, most significantly a lytic lesion of the T9 vertebral body (series 9, image 58). Review of the MIP images confirms the above findings. IMPRESSION: 1. Negative examination for pulmonary embolism. 2. There is new, extensive ground-glass airspace opacity throughout the lungs, somewhat geographic in the upper lobes, although somewhat more nodular appearing in the lower lungs. Findings are nonspecific and infectious or inflammatory, differential considerations primarily include drug toxicity and atypical/viral infection. 3. Diffuse bilateral bronchial wall thickening, consistent with nonspecific infectious or inflammatory bronchitis. 4. Redemonstrated findings of advanced metastatic lung malignancy, better assessed by recent prior staging examination. 5. Coronary artery disease. Electronically Signed   By: Eddie Candle M.D.   On: 02/27/2021 10:49   CT Thoracic Spine Wo Contrast  Result Date: 02/18/2021 CLINICAL DATA:  Metastases on CT abdomen EXAM: CT THORACIC AND LUMBAR SPINE WITHOUT CONTRAST TECHNIQUE: Multidetector CT imaging of the thoracic and lumbar spine was performed without contrast. Multiplanar CT image  reconstructions were also generated. COMPARISON:  None. FINDINGS: CT THORACIC SPINE FINDINGS Alignment: Preserved. Vertebrae: There is a small partially sclerotic lesion at the right aspect of the T5 vertebral body. A lytic lesion is present at the anterior aspect T9 likely with pathologic fracture but no substantial height loss. Ill-defined lucent lesions are present elsewhere. Paraspinal and other soft tissues: Better evaluated on prior dedicated imaging. Disc levels: Multilevel degenerative changes. No high-grade degenerative canal narrowing. CT LUMBAR SPINE FINDINGS Segmentation: 5 lumbar type vertebrae. Alignment: Dextrocurvature.  Grade 1 anterolisthesis at L4-L5 Vertebrae:  Lucency and sclerosis at L2 with pathologic compression fracture resulting in less than 50% loss of height at the superior endplate. Sclerotic lesion of the right sacrum. Paraspinal and other soft tissues: Better evaluated on prior dedicated imaging. Disc levels: Multilevel degenerative changes. Canal stenosis is greatest at L3-L4 and L4-L5. Foraminal narrowing is greatest on the right at L5-S1. IMPRESSION: Sclerotic and lytic metastatic lesions as already identified on the CT chest, abdomen, and pelvis. Pathologic L2 fracture with less than 50% loss of height. Probable pathologic fracture at T9 without height loss. No apparent significant epidural disease by CT. Electronically Signed   By: Macy Mis M.D.   On: 02/18/2021 10:53   CT Lumbar Spine Wo Contrast  Result Date: 02/18/2021 CLINICAL DATA:  Metastases on CT abdomen EXAM: CT THORACIC AND LUMBAR SPINE WITHOUT CONTRAST TECHNIQUE: Multidetector CT imaging of the thoracic and lumbar spine was performed without contrast. Multiplanar CT image reconstructions were also generated. COMPARISON:  None. FINDINGS: CT THORACIC SPINE FINDINGS Alignment: Preserved. Vertebrae: There is a small partially sclerotic lesion at the right aspect of the T5 vertebral body. A lytic lesion is present at the anterior aspect T9 likely with pathologic fracture but no substantial height loss. Ill-defined lucent lesions are present elsewhere. Paraspinal and other soft tissues: Better evaluated on prior dedicated imaging. Disc levels: Multilevel degenerative changes. No high-grade degenerative canal narrowing. CT LUMBAR SPINE FINDINGS Segmentation: 5 lumbar type vertebrae. Alignment: Dextrocurvature.  Grade 1 anterolisthesis at L4-L5 Vertebrae: Lucency and sclerosis at L2 with pathologic compression fracture resulting in less than 50% loss of height at the superior endplate. Sclerotic lesion of the right sacrum. Paraspinal and other soft tissues: Better evaluated on prior dedicated  imaging. Disc levels: Multilevel degenerative changes. Canal stenosis is greatest at L3-L4 and L4-L5. Foraminal narrowing is greatest on the right at L5-S1. IMPRESSION: Sclerotic and lytic metastatic lesions as already identified on the CT chest, abdomen, and pelvis. Pathologic L2 fracture with less than 50% loss of height. Probable pathologic fracture at T9 without height loss. No apparent significant epidural disease by CT. Electronically Signed   By: Macy Mis M.D.   On: 02/18/2021 10:53   NM Bone Scan Whole Body  Result Date: 02/27/2021 CLINICAL DATA:  Metastatic disease evaluation, lung mass, abnormal CT EXAM: NUCLEAR MEDICINE WHOLE BODY BONE SCAN TECHNIQUE: Whole body anterior and posterior images were obtained approximately 3 hours after intravenous injection of radiopharmaceutical. RADIOPHARMACEUTICALS:  19.79 mCi Technetium-8m MDP IV COMPARISON:  None Correlation: CT chest abdomen pelvis 02/15/2021, CT thoracic and lumbar spine 02/18/2021, CT head 02/15/2021 FINDINGS: Multiple sites of abnormal tracer uptake are identified consistent with widespread osseous metastatic disease. These include calvaria greatest at occipital bone growth more on LEFT, thoracic and lumbar spine, RIGHT ribs, and pelvis. Dextroconvex thoracolumbar scoliosis. No definite abnormal tracer uptake within the humeri or femora. Expected urinary tract and soft tissue distribution of tracer. IMPRESSION: Scattered osseous metastases as above. Electronically Signed  By: Lavonia Dana M.D.   On: 02/27/2021 18:17   US Renal  Result Date: 02/18/2021 CLINICAL DATA:  Back pain. Right renal pelvis fullness on right upper quadrant ultrasound performed 1 day prior EXAM: RENAL / URINARY TRACT ULTRASOUND COMPLETE COMPARISON:  02/17/2021 abdominal sonogram. FINDINGS: Right Kidney: Renal measurements: 10.3 x 4.2 x 4.2 cm = volume: 95 mL. Normal parenchymal echogenicity and thickness. Mild right hydronephrosis. Possible 5 mm right  ureteropelvic junction stone. Nonobstructing 3 mm lower right renal stone. No renal masses. Left Kidney: Renal measurements: 10.0 x 4.8 x 4.2 cm = volume: 107 mL. Normal parenchymal echogenicity and thickness. No left hydronephrosis. No renal masses. Probable nonobstructing 7 mm upper left renal stone. Bladder: Appears normal for degree of bladder distention. Bilateral ureteral jets seen in the bladder. Other: None. IMPRESSION: 1. Mild right hydronephrosis. Possible 5 mm right UPJ stone. Nonobstructing 3 mm lower right renal stone. Suggest unenhanced CT abdomen/pelvis for further evaluation. 2. Probable nonobstructing upper left renal stone. Electronically Signed   By: Ilona Sorrel M.D.   On: 02/18/2021 12:02   DG Chest Portable 1 View  Result Date: 03/15/2021 CLINICAL DATA:  Shortness of breath EXAM: PORTABLE CHEST 1 VIEW COMPARISON:  03/07/2021 FINDINGS: Cardiac shadow is stable. Patchy airspace opacities are again identified slightly improved when compared with the prior exam. No new focal infiltrate is seen. No bony abnormality is noted. IMPRESSION: Slight improvement in previously seen airspace opacities bilaterally. Electronically Signed   By: Inez Catalina M.D.   On: 03/15/2021 11:38   DG Chest Portable 1 View  Result Date: 03/07/2021 CLINICAL DATA:  Shortness of breath EXAM: PORTABLE CHEST 1 VIEW COMPARISON:  03/05/2021 FINDINGS: Patchy bilateral airspace disease throughout the lungs, slightly worsened since prior study. Nodular areas within the lungs, stable since prior study. Heart is normal size. No effusions or acute bony abnormality. IMPRESSION: Worsening patchy bilateral airspace disease, likely worsening pneumonia. Scattered nodules unchanged. Electronically Signed   By: Rolm Baptise M.D.   On: 03/07/2021 21:41   DG Chest Port 1 View  Result Date: 02/27/2021 CLINICAL DATA:  Worsening shortness of breath since this morning. Recent diagnosis of lung cancer. EXAM: PORTABLE CHEST 1 VIEW  COMPARISON:  Radiograph earlier today.  CT earlier today. FINDINGS: Stable heart size and mediastinal contours. Right hilar prominence and thickening of the lower paratracheal stripe related to known adenopathy. Unchanged right mid lung pulmonary nodule. Mild patchy bilateral suprahilar opacities corresponding to ground-glass opacity on CT earlier today. No significant change in the interim. No pneumothorax or large pleural effusion. Retained excreted IV contrast in the right renal collecting system with right hydronephrosis, partially included in the upper abdomen. IMPRESSION: 1. Stable radiographic appearance of the chest from earlier today. Similar patchy suprahilar opacities corresponding to ground-glass opacity on CT. 2. Right mid lung pulmonary nodule and thoracic adenopathy. 3. Partially included in the upper abdomen is retained excreted contrast in the right renal collecting system with right hydronephrosis. Right hydronephrosis was seen on renal ultrasound 02/18/2021. Electronically Signed   By: Keith Rake M.D.   On: 02/27/2021 15:04   DG Chest Portable 1 View  Result Date: 02/27/2021 CLINICAL DATA:  60 year old female with shortness of breath and cough. Metastatic lung cancer. EXAM: PORTABLE CHEST 1 VIEW COMPARISON:  02/15/2021 and prior studies FINDINGS: New hazy opacities within the central/upper lungs noted. RIGHT middle lobe mass and small scattered pulmonary nodules bilaterally are again identified. No pleural effusion or pneumothorax. No acute bony abnormalities are identified. IMPRESSION:  New hazy opacities within the central/upper lungs which may represent edema or infection. Unchanged RIGHT middle lobe mass and bilateral pulmonary nodules compatible with known malignancy/metastases. Electronically Signed   By: Margarette Canada M.D.   On: 02/27/2021 08:49   [place] CORE BIOPSY (LYMPH NODES)  Result Date: 02/21/2021 INDICATION: Multiple enlarged lymph nodes, right middle lobe lung mass, liver  lesions and bone lesions. EXAM: ULTRASOUND GUIDED CORE BIOPSY OF LEFT SUPRACLAVICULAR LYMPH NODE MEDICATIONS: None. ANESTHESIA/SEDATION: Versed 2.0 mg IV Moderate Sedation Time:  12 minutes. The patient was continuously monitored during the procedure by the interventional radiology nurse under my direct supervision. PROCEDURE: The procedure, risks, benefits, and alternatives were explained to the patient. Questions regarding the procedure were encouraged and answered. The patient understands and consents to the procedure. A time-out was performed prior to initiating the procedure. The left neck was prepped with chlorhexidine in a sterile fashion, and a sterile drape was applied covering the operative field. A sterile gown and sterile gloves were used for the procedure. Local anesthesia was provided with 1% Lidocaine. After localizing an enlarged left supraclavicular lymph node by ultrasound, multiple 18 gauge core biopsy samples were obtained. Core biopsy samples were submitted in formalin as well as on saline soaked Telfa gauze. Additional ultrasound was performed. COMPLICATIONS: None immediate. FINDINGS: An enlarged left supraclavicular lymph node measures approximately 3.0 x 1.8 x 2.9 cm. Solid core biopsy samples were obtained. IMPRESSION: Ultrasound-guided core biopsy performed of an enlarged left supraclavicular lymph node measuring 3 cm in greatest dimensions. Electronically Signed   By: Aletta Edouard M.D.   On: 02/21/2021 15:31   US Abdomen Limited RUQ (LIVER/GB)  Result Date: 02/17/2021 CLINICAL DATA:  Nausea and vomiting for 1 day. EXAM: ULTRASOUND ABDOMEN LIMITED RIGHT UPPER QUADRANT COMPARISON:  CT chest, abdomen and pelvis 02/15/2021. FINDINGS: Gallbladder: Removed. Common bile duct: Diameter: 0.8 cm. Liver: Three hypoechoic liver lesions measuring up to 1.8 cm are identified are identified and correlate with metastatic deposit seen on prior CT. Mild intrahepatic biliary ductal dilatation seen on  CT is also noted. Portal vein is patent on color Doppler imaging with normal direction of blood flow towards the liver. Other: There is fullness of the right renal pelvis which is new since the patient's CT scan yesterday. IMPRESSION: Three hypoechoic liver lesions measuring up to 1.8 cm correlate with metastatic deposit seen on CT. Status post cholecystectomy. Mild intra and extrahepatic biliary ductal dilatation is likely related to cholecystectomy. No obstructing lesion is identified. Fullness of the right renal pelvis is new since yesterday's examination. This could be incidental. Correlation with dedicated renal ultrasound could be used for further evaluation if indicated. Electronically Signed   By: Inge Rise M.D.   On: 02/17/2021 12:53    Microbiology: Recent Results (from the past 240 hour(s))  Resp Panel by RT-PCR (Flu A&B, Covid) Nasopharyngeal Swab     Status: None   Collection Time: 03/07/21  9:24 PM   Specimen: Nasopharyngeal Swab; Nasopharyngeal(NP) swabs in vial transport medium  Result Value Ref Range Status   SARS Coronavirus 2 by RT PCR NEGATIVE NEGATIVE Final    Comment: (NOTE) SARS-CoV-2 target nucleic acids are NOT DETECTED.  The SARS-CoV-2 RNA is generally detectable in upper respiratory specimens during the acute phase of infection. The lowest concentration of SARS-CoV-2 viral copies this assay can detect is 138 copies/mL. A negative result does not preclude SARS-Cov-2 infection and should not be used as the sole basis for treatment or other patient management decisions. A  negative result may occur with  improper specimen collection/handling, submission of specimen other than nasopharyngeal swab, presence of viral mutation(s) within the areas targeted by this assay, and inadequate number of viral copies(<138 copies/mL). A negative result must be combined with clinical observations, patient history, and epidemiological information. The expected result is  Negative.  Fact Sheet for Patients:  EntrepreneurPulse.com.au  Fact Sheet for Healthcare Providers:  IncredibleEmployment.be  This test is no t yet approved or cleared by the [place] FDA and  has been authorized for detection and/or diagnosis of SARS-CoV-2 by FDA under an Emergency Use Authorization (EUA). This EUA will remain  in effect (meaning this test can be used) for the duration of the COVID-19 declaration under Section 564(b)(1) of the Act, 21 U.S.C.section 360bbb-3(b)(1), unless the authorization is terminated  or revoked sooner.       Influenza A by PCR NEGATIVE NEGATIVE Final   Influenza B by PCR NEGATIVE NEGATIVE Final    Comment: (NOTE) The Xpert Xpress SARS-CoV-2/FLU/RSV plus assay is intended as an aid in the diagnosis of influenza from Nasopharyngeal swab specimens and should not be used as a sole basis for treatment. Nasal washings and aspirates are unacceptable for Xpert Xpress SARS-CoV-2/FLU/RSV testing.  Fact Sheet for Patients: EntrepreneurPulse.com.au  Fact Sheet for Healthcare Providers: IncredibleEmployment.be  This test is not yet approved or cleared by the [place] FDA and has been authorized for detection and/or diagnosis of SARS-CoV-2 by FDA under an Emergency Use Authorization (EUA). This EUA will remain in effect (meaning this test can be used) for the duration of the COVID-19 declaration under Section 564(b)(1) of the Act, 21 U.S.C. section 360bbb-3(b)(1), unless the authorization is terminated or revoked.  Performed at [facility], [address] [place], [place] [address]   Blood culture (routine x 2)     Status: None   Collection Time: 03/07/21  9:24 PM   Specimen: BLOOD  Result Value Ref Range Status   Specimen Description BLOOD BLOOD RIGHT HAND  Final   Special Requests   Final    BOTTLES DRAWN AEROBIC AND ANAEROBIC Blood Culture adequate  volume   Culture   Final    NO GROWTH 5 DAYS Performed at [facility], [address], [place], [place] [address]    Report Status 03/12/2021 FINAL  Final  Blood culture (routine x 2)     Status: None   Collection Time: 03/07/21  9:24 PM   Specimen: BLOOD  Result Value Ref Range Status   Specimen Description BLOOD RIGHT ANTECUBITAL  Final   Special Requests   Final    BOTTLES DRAWN AEROBIC AND ANAEROBIC Blood Culture adequate volume   Culture   Final    NO GROWTH 5 DAYS Performed at [facility], [address] [place], [place] [address]    Report Status 03/12/2021 FINAL  Final  Culture, blood (routine x 2)     Status: Abnormal   Collection Time: 03/15/21 11:09 AM   Specimen: BLOOD  Result Value Ref Range Status   Specimen Description   Final    BLOOD RIGHT HAND Performed at [facility], [address], [place], [place] [address]    Special Requests   Final    BOTTLES DRAWN AEROBIC AND ANAEROBIC Blood Culture results may not be optimal due to an inadequate volume of blood received in culture bottles Performed at [facility], [address] [place], [place] [address]    Culture  Setup Time  Final    Organism ID to follow GRAM POSITIVE COCCI AEROBIC BOTTLE ONLY CRITICAL RESULT CALLED TO, READ BACK BY AND VERIFIED WITH: ABBY ELLINGTON AT 1103 03/16/21 SDR Performed at [facility], [address] [place], [place] [address]    Culture (A)  Final    STAPHYLOCOCCUS EPIDERMIDIS THE SIGNIFICANCE OF ISOLATING THIS ORGANISM FROM A SINGLE SET OF BLOOD CULTURES WHEN MULTIPLE SETS ARE DRAWN IS UNCERTAIN. PLEASE NOTIFY THE MICROBIOLOGY DEPARTMENT WITHIN ONE WEEK IF SPECIATION AND SENSITIVITIES ARE REQUIRED. Performed at [facility], [place] [address] [place], [place] [address]    Report Status 03/17/2021 FINAL  Final  Resp Panel by RT-PCR (Flu A&B, Covid) Nasopharyngeal Swab     Status: None   Collection Time: 03/15/21 11:09  AM   Specimen: Nasopharyngeal Swab; Nasopharyngeal(NP) swabs in vial transport medium  Result Value Ref Range Status   SARS Coronavirus 2 by RT PCR NEGATIVE NEGATIVE Final    Comment: (NOTE) SARS-CoV-2 target nucleic acids are NOT DETECTED.  The SARS-CoV-2 RNA is generally detectable in upper respiratory specimens during the acute phase of infection. The lowest concentration of SARS-CoV-2 viral copies this assay can detect is 138 copies/mL. A negative result does not preclude SARS-Cov-2 infection and should not be used as the sole basis for treatment or other patient management decisions. A negative result may occur with  improper specimen collection/handling, submission of specimen other than nasopharyngeal swab, presence of viral mutation(s) within the areas targeted by this assay, and inadequate number of viral copies(<138 copies/mL). A negative result must be combined with clinical observations, patient history, and epidemiological information. The expected result is Negative.  Fact Sheet for Patients:  EntrepreneurPulse.com.au  Fact Sheet for Healthcare Providers:  IncredibleEmployment.be  This test is no t yet approved or cleared by the [place] FDA and  has been authorized for detection and/or diagnosis of SARS-CoV-2 by FDA under an Emergency Use Authorization (EUA). This EUA will remain  in effect (meaning this test can be used) for the duration of the COVID-19 declaration under Section 564(b)(1) of the Act, 21 U.S.C.section 360bbb-3(b)(1), unless the authorization is terminated  or revoked sooner.       Influenza A by PCR NEGATIVE NEGATIVE Final   Influenza B by PCR NEGATIVE NEGATIVE Final    Comment: (NOTE) The Xpert Xpress SARS-CoV-2/FLU/RSV plus assay is intended as an aid in the diagnosis of influenza from Nasopharyngeal swab specimens and should not be used as a sole basis for treatment. Nasal washings and aspirates are  unacceptable for Xpert Xpress SARS-CoV-2/FLU/RSV testing.  Fact Sheet for Patients: EntrepreneurPulse.com.au  Fact Sheet for Healthcare Providers: IncredibleEmployment.be  This test is not yet approved or cleared by the [place] FDA and has been authorized for detection and/or diagnosis of SARS-CoV-2 by FDA under an Emergency Use Authorization (EUA). This EUA will remain in effect (meaning this test can be used) for the duration of the COVID-19 declaration under Section 564(b)(1) of the Act, 21 U.S.C. section 360bbb-3(b)(1), unless the authorization is terminated or revoked.  Performed at [facility], [address] [place], [place] [address]   Blood Culture ID Panel (Reflexed)     Status: Abnormal   Collection Time: 03/15/21 11:09 AM  Result Value Ref Range Status   Enterococcus faecalis NOT DETECTED NOT DETECTED Final   Enterococcus Faecium NOT DETECTED NOT DETECTED Final   Listeria monocytogenes NOT DETECTED NOT DETECTED Final   Staphylococcus species DETECTED (A) NOT DETECTED Final    Comment:  CRITICAL RESULT CALLED TO, READ BACK BY AND VERIFIED WITH:  ABBY ELLINGTON AT 1103 03/16/21 SDR    Staphylococcus aureus (BCID) NOT DETECTED NOT DETECTED Final   Staphylococcus epidermidis DETECTED (A) NOT DETECTED Final    Comment: Methicillin (oxacillin) resistant coagulase negative staphylococcus. Possible blood culture contaminant (unless isolated from more than one blood culture draw or clinical case suggests pathogenicity). No antibiotic treatment is indicated for blood  culture contaminants. CRITICAL RESULT CALLED TO, READ BACK BY AND VERIFIED WITH:  ABBY ELLINGTON AT 8413 03/16/21 SDR    Staphylococcus lugdunensis NOT DETECTED NOT DETECTED Final   Streptococcus species NOT DETECTED NOT DETECTED Final   Streptococcus agalactiae NOT DETECTED NOT DETECTED Final   Streptococcus pneumoniae NOT DETECTED NOT DETECTED Final    Streptococcus pyogenes NOT DETECTED NOT DETECTED Final   A.calcoaceticus-baumannii NOT DETECTED NOT DETECTED Final   Bacteroides fragilis NOT DETECTED NOT DETECTED Final   Enterobacterales NOT DETECTED NOT DETECTED Final   Enterobacter cloacae complex NOT DETECTED NOT DETECTED Final   Escherichia coli NOT DETECTED NOT DETECTED Final   Klebsiella aerogenes NOT DETECTED NOT DETECTED Final   Klebsiella oxytoca NOT DETECTED NOT DETECTED Final   Klebsiella pneumoniae NOT DETECTED NOT DETECTED Final   Proteus species NOT DETECTED NOT DETECTED Final   Salmonella species NOT DETECTED NOT DETECTED Final   Serratia marcescens NOT DETECTED NOT DETECTED Final   Haemophilus influenzae NOT DETECTED NOT DETECTED Final   Neisseria meningitidis NOT DETECTED NOT DETECTED Final   Pseudomonas aeruginosa NOT DETECTED NOT DETECTED Final   Stenotrophomonas maltophilia NOT DETECTED NOT DETECTED Final   Candida albicans NOT DETECTED NOT DETECTED Final   Candida auris NOT DETECTED NOT DETECTED Final   Candida glabrata NOT DETECTED NOT DETECTED Final   Candida krusei NOT DETECTED NOT DETECTED Final   Candida parapsilosis NOT DETECTED NOT DETECTED Final   Candida tropicalis NOT DETECTED NOT DETECTED Final   Cryptococcus neoformans/gattii NOT DETECTED NOT DETECTED Final   Methicillin resistance mecA/C DETECTED (A) NOT DETECTED Final    Comment: CRITICAL RESULT CALLED TO, READ BACK BY AND VERIFIED WITH:  ABBY ELLINGTON AT 1103 03/16/21 SDR Performed at [facility], [address] [place], [place] [address]   Culture, blood (routine x 2)     Status: None (Preliminary result)   Collection Time: 03/15/21 12:10 PM   Specimen: BLOOD  Result Value Ref Range Status   Specimen Description BLOOD RIGHT [facility]  Final   Special Requests   Final    BOTTLES DRAWN AEROBIC AND ANAEROBIC Blood Culture adequate volume   Culture   Final    NO GROWTH 2 DAYS Performed at [facility], [address]  [place], [place] [address]    Report Status PENDING  Incomplete  MRSA PCR Screening     Status: None   Collection Time: 03/15/21  2:30 PM   Specimen: Nasal Mucosa; Nasopharyngeal  Result Value Ref Range Status   MRSA by PCR NEGATIVE NEGATIVE Final    Comment:        The GeneXpert MRSA Assay (FDA approved for NASAL specimens only), is one component of a comprehensive MRSA colonization surveillance program. It is not intended to diagnose MRSA infection nor to guide or monitor treatment for MRSA infections. Performed at [facility], [address] [place], [place] [address]      Labs: Basic Metabolic Panel: Recent Labs  Lab 03/14/21 0817 03/15/21 1109 03/16/21 0314 03/17/21 0321  NA 139 140 140 139  K  2.6* 3.6 4.0 4.2  CL 100 106 109 108  CO2 26 23 24 23   GLUCOSE 127* 137* 143* 157*  BUN 30* 30* 26* 24*  CREATININE 1.02* 0.86 0.72 0.61  CALCIUM 8.4* 8.2* 7.7* 7.8*  MG 1.7  --   --   --    Liver Function Tests: Recent Labs  Lab 03/14/21 0817 03/15/21 1109  AST 30 27  ALT 30 27  ALKPHOS 331* 278*  BILITOT 0.5 0.4  PROT 7.0 6.1*  ALBUMIN 3.3* 2.6*   Recent Labs  Lab 03/15/21 1109  LIPASE 50   Recent Labs  Lab 03/15/21 1109  AMMONIA 23   CBC: Recent Labs  Lab 03/14/21 0817 03/15/21 1109 03/16/21 0314 03/17/21 0321  WBC 27.6* 18.1* 16.5* 15.8*  NEUTROABS 24.3* 16.9*  --   --   HGB 9.7* 8.5* 7.6* 8.2*  HCT 30.3* 25.9* 23.6* 24.8*  MCV 87.6 84.6 84.3 83.5  PLT 122* 97* 99* 75*   Cardiac Enzymes: No results for input(s): CKTOTAL, CKMB, CKMBINDEX, TROPONINI in the last 168 hours. BNP: BNP (last 3 results) Recent Labs    03/05/21 2143 03/07/21 2123 03/15/21 1109  BNP 92.2 110.1* 202.3*    ProBNP (last 3 results) No results for input(s): PROBNP in the last 8760 hours.  CBG: No results for input(s): GLUCAP in the last 168 hours.  Signed:  Domenic Polite MD.  [facility] 03/17/2021, 4:09 PM

## 2021-03-17 NOTE — Progress Notes (Signed)
Patient took off nasal cannula due to being upset and wanting to leave the hospital. Patient oxygen decreased to 68%. Put patient back on nasal cannula at 4L and asked her to take a few deep breaths. Patients oxygen saturation increased to 94%. Patient continuously takes oxygen off to get attention from the staff stating that call bell does not work. I tried the call bell and the call bell does work. Once the patient saw the call bell worked she apologized and said that she is just tired of being in pain.

## 2021-03-17 NOTE — Progress Notes (Signed)
Patient reports burning at IV site.  IV phenergan infusion stopped. Right hand PIV burns when flushed as well. Patient has poor vasculature, consult to IV team placed for new IV access.

## 2021-03-17 NOTE — TOC Transition Note (Addendum)
Transition of Care [facility]) - Progression Note    Patient Details  Name: Brenda Middleton MRN: [id number] Date of Birth: 05-03-1961  Transition of Care [facility]) CM/SW [place], Britt Phone Number: [phone] 03/17/2021, 2:49 PM  Clinical Narrative:      CSW spoke with patient and her Win,Sandy (Sister) [phone]. CSW explained role of TOC in patient care.  Patient is adamant she would like to return home today and will not contemplate SNF placement.  CSW asked patient if she felt comfortable performing all ADLs on her own, patient affirmed shew as able to do so.    CSW asked patient's sister [name] if she felt comfortable meeting her sister's needs.  [name] stated she would try the best she could but was unsure if she would be able to care for her the way she needs.  Patient states she would be abel to manage on her own and wanted to return home.    CSW stated I would set up home health, DME and O2.  CSW stated that I could not guarantee a home health agency would take her insurance but I would contact all home health agencies in the area.  Patient verbalized understanding and stated she was impatient leave.  CWS contacted Zach with [employer] for DME/O2. Equipment delivered by [employer].  CSW contacted [facility] and [facility] for home health needs.  CSW contacted Malachy Mood at [facility] and she stated she is not taking Medicaid patient's at this time.      Expected Discharge Plan and Services                                                 Social Determinants of Health (SDOH) Interventions    Readmission Risk Interventions Readmission Risk Prevention Plan 02/28/2021  Transportation Screening Complete  Medication Review ([place]) Complete  PCP or Specialist appointment within 3-5 days of discharge Complete  HRI or Home Care Consult Patient refused  SW Recovery Care/Counseling Consult Complete  Palliative Care Screening Not  [place] Not Applicable  Some recent data might be hidden

## 2021-03-17 NOTE — Progress Notes (Signed)
Discharge instructions (including medications) discussed with patient and sister, and copy provided to patient.

## 2021-03-17 NOTE — Progress Notes (Signed)
Patient woke up from sleep and began punching the bed stating that she felt like she was going to throw up. Patient also stated that her stomach was cramping really bad. Gave patient zofran per order and also gave patient 5mg  of percocet per order. During this process the patient asked how long she has been in the hospital and if she was awake when her sister was here on 4/13. Patient stated that she does not remember much from yesterday but does remember the reason for her coming to the hospital. Patient stated "I want to die, why do I always have to be in so much pain, why won't the pain go away". Patient pulled off her nasal cannula twice within this interaction, both times I reiterated that it is important to keep her oxygen on due to her saturation dropping too low. Patient allowed me to place oxygen back on her face. I provided emotional support to the patient. Patient is currently attempting to go back to sleep after receiving the zofran and percocet.

## 2021-03-17 NOTE — ED Provider Notes (Signed)
[facility] Emergency Department Provider Note   ____________________________________________   Event Date/Time   First MD Initiated Contact with Patient 03/17/21 2042     (approximate)  I have reviewed the triage vital signs and the nursing notes.   HISTORY  Chief Complaint Abdominal Pain and Nausea    HPI Brenda Middleton is a 60 y.o. female with past medical history of metastatic lung cancer, hepatitis C, COPD, and polysubstance abuse who presents to the ED complaining of abdominal pain and vomiting.  History is limited as patient is crying out in severe pain.  She was discharged from the hospital earlier today following admission for altered mental status and concern for polypharmacy.  Shortly after returning home, patient reports developing severe lower abdominal pain with nausea and vomiting.  She has been unable to keep anything down since then.  EMS also reported that patient was hypoxic into the 80s on room air, subsequently placed on 4 L nasal cannula.  Patient with known metastatic disease to liver and brain, recently received chemotherapy 4 days ago.  Patient currently denies any chest pain or shortness of breath.        Past Medical History:  Diagnosis Date  . Anxiety   . Benign neoplasm of cervix uteri   . Chronic hepatitis C without mention of hepatic coma   . Chronic low back pain 08/26/2014  . Constipation   . Depressive disorder   . Displacement of cervical intervertebral disc   . Hypertensive disorder   . Iron deficiency anemia due to chronic blood loss 09/12/2019  . Palpitations   . Prolapsed cervical intervertebral disc     Patient Active Problem List   Diagnosis Date Noted  . Acute pulmonary embolism ([place]) 03/18/2021  . Opiate overdose ([place])   . Thrombocytopenia ([place]) 03/15/2021  . Hypokalemia 03/15/2021  . Hypomagnesemia 03/15/2021  . Acute respiratory failure with hypoxia ([place]) 03/07/2021  . Postobstructive pneumonia  02/27/2021  . HTN (hypertension) 02/27/2021  . Depression with anxiety 02/27/2021  . Normocytic anemia 02/27/2021  . Sepsis ([place]) 02/27/2021  . Protein-calorie malnutrition, moderate ([place]) 02/27/2021  . Malignant neoplasm of lung ([place]) 02/27/2021  . Elevated troponin 02/27/2021  . Toxic metabolic encephalopathy 63/12/6008  . Brain metastases ([place]) 02/27/2021  . Shortness of breath   . Metastatic malignant neoplasm ([place])   . Intractable low back pain 02/18/2021  . Anxiety   . Depressive disorder   . Pathologic fracture of lumbar vertebra, initial encounter   . 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 COPD exacerbation ([place])   . GERD (gastroesophageal reflux disease)   . Palliative care encounter   . Pathologic compression fracture of lumbar vertebra ([place])   . Goals of care, counseling/discussion   . Neoplasm related pain   . Iron deficiency anemia due to chronic blood loss 09/12/2019  . Family history of cancer 09/12/2019  . Blood in the stool 09/12/2019  . Elevated alkaline phosphatase level 09/12/2019  . Chronic low back pain 08/26/2014    Past Surgical History:  Procedure Laterality Date  . CONIZATION CERVIX    . DILATION AND CURETTAGE OF UTERUS    . HAND SURGERY  2008,2015   Carpel Tunnel  . TONSILLECTOMY    . VULVECTOMY      Prior to Admission medications   Medication Sig Start Date End Date Taking? Authorizing Provider  albuterol (VENTOLIN HFA) 108 (90 Base) MCG/ACT inhaler Inhale 2 puffs into the lungs every 4 (four)  hours as needed for wheezing or shortness of breath. 02/21/21   Danford, Suann Larry, MD  budesonide-formoterol (SYMBICORT) 80-4.5 MCG/ACT inhaler Inhale 2 puffs into the lungs 2 (two) times daily.    [provider]  busPIRone (BUSPAR) 10 MG tablet Take 1 tablet (10 mg total) by mouth 2 (two) times daily. 03/07/21   Earlie Server, MD  clonazePAM (KLONOPIN) 0.5 MG tablet Take 1 tablet (0.5 mg total) by mouth 2 (two) times daily as needed (anxiety).  03/17/21   Domenic Polite, MD  dexamethasone (DECADRON) 4 MG tablet Take 1 tablet (4 mg total) by mouth 2 (two) times daily. 03/02/21   Oswald Hillock, MD  fentaNYL (DURAGESIC) 25 MCG/HR Place 1 patch onto the skin every 3 (three) days. 03/20/21   Domenic Polite, MD  folic acid (FOLVITE) 1 MG tablet Take 1 tablet (1 mg total) by mouth daily. Start 5-7 days before Alimta chemotherapy. Continue until 21 days after Al[place]ta completed. 03/04/21   Earlie Server, MD  furosemide (LASIX) 20 MG tablet Take 1 tablet (20 mg total) by mouth daily. 03/17/21   Domenic Polite, MD  gabapentin (NEURONTIN) 600 MG tablet Take 0.5 tablets (300 mg total) by mouth at bedtime. 03/17/21   Domenic Polite, MD  guaiFENesin (MUCINEX) 600 MG 12 hr tablet Take 2 tablets (1,200 mg total) by mouth 2 (two) times daily. 03/02/21   Oswald Hillock, MD  ipratropium-albuterol (DUONEB) 0.5-2.5 (3) MG/3ML SOLN Take 3 mLs by nebulization every 6 (six) hours as needed. 03/08/21   Borders, Kirt Boys, NP  lidocaine-prilocaine (EMLA) cream Apply to affected area once Patient taking differently: Apply 1 application topically daily as needed. Apply to affected area once 03/04/21   Earlie Server, MD  magic mouthwash SOLN Take 5 mLs by mouth 3 (three) times daily as needed for mouth pain. 03/09/21   Earlie Server, MD  metoprolol tartrate (LOPRESSOR) 25 MG tablet Take 0.5 tablets (12.5 mg total) by mouth 2 (two) times daily. 03/02/21 06/30/21  Oswald Hillock, MD  oxyCODONE-acetaminophen (PERCOCET/ROXICET) 5-325 MG tablet Take 1-2 tablets by mouth every 6 (six) hours as needed for severe pain. 03/17/21   Domenic Polite, MD  pantoprazole (PROTONIX) 40 MG tablet Take 40 mg by mouth daily. 02/08/21   [provider]  potassium chloride SA (KLOR-CON) 20 MEQ tablet Take 1 tablet (20 mEq total) by mouth daily. 03/17/21   Domenic Polite, MD  promethazine (PHENERGAN) 12.5 MG tablet Take 1 tablet (12.5 mg total) by mouth every 6 (six) hours as needed for nausea or vomiting. 03/07/21    Earlie Server, MD  QUEtiapine (SEROQUEL) 400 MG tablet Take 0.5 tablets (200 mg total) by mouth at bedtime. 03/17/21   Domenic Polite, MD  amitriptyline (ELAVIL) 25 MG tablet Take 2 tablets (50 mg total) by mouth at bedtime. Patient not taking: No sig reported 08/26/14 01/01/21  Kathrynn Ducking, MD  temazepam (RESTORIL) 30 MG capsule Take 30 mg by mouth at bedtime.  01/01/21  [provider]    Allergies Penicillin g, Penicillin v, and Penicillins  Family History  Problem Relation Age of Onset  . Breast cancer Mother 70  . Lung cancer Father   . Cancer Brother   . Cancer Other   . Stroke Other     Social History Social History   Tobacco Use  . Smoking status: Former Smoker    Packs/day: 0.70    Years: 35.00    Pack years: 24.50    Types: Cigarettes  .  Smokeless tobacco: Never Used  Vaping Use  . Vaping Use: Never used  Substance Use Topics  . Alcohol use: Yes    Comment: occasional wine  . Drug use: No    Review of Systems  Constitutional: No fever/chills Eyes: No visual changes. ENT: No sore throat. Cardiovascular: Denies chest pain. Respiratory: Denies shortness of breath. Gastrointestinal: Positive for abdominal pain, nausea, and vomiting.  No diarrhea.  No constipation. Genitourinary: Negative for dysuria. Musculoskeletal: Negative for back pain. Skin: Negative for rash. Neurological: Negative for headaches, focal weakness or numbness.  ____________________________________________   PHYSICAL EXAM:  VITAL SIGNS: ED Triage Vitals  Enc Vitals Group     BP 03/17/21 2046 (!) 159/115     Pulse Rate 03/17/21 2046 (!) 134     Resp 03/17/21 2048 (!) 21     Temp 03/17/21 2048 98.4 F (36.9 C)     Temp Source 03/17/21 2048 Oral     SpO2 03/17/21 2048 96 %     Weight 03/17/21 2045 111 lb 12.4 oz (50.7 kg)     Height 03/17/21 2045 5\' 6"  (1.676 m)     Head Circumference --      Peak Flow --      Pain Score --      Pain Loc --      Pain Edu? --       Excl. in [place]? --     Constitutional: Alert and oriented. Eyes: Conjunctivae are normal. Head: Atraumatic. Nose: No congestion/rhinnorhea. Mouth/Throat: Mucous membranes are dry. Neck: Normal ROM Cardiovascular: Tachycardic, regular rhythm. Grossly normal heart sounds. Respiratory: Normal respiratory effort.  No retractions. Lungs with diffuse rales. Gastrointestinal: Soft and diffusely tender to palpation with no rebound or guarding. No distention. Genitourinary: deferred Musculoskeletal: No lower extremity tenderness nor edema. Neurologic:  Normal speech and language. No gross focal neurologic deficits are appreciated. Skin:  Skin is warm, dry and intact. No rash noted. Psychiatric: Mood and affect are normal. Speech and behavior are normal.  ____________________________________________   LABS (all labs ordered are listed, but only abnormal results are displayed)  Labs Reviewed  CBC WITH DIFFERENTIAL/PLATELET - Abnormal; Notable for the following components:      Result Value   WBC 16.2 (*)    RBC 3.18 (*)    Hemoglobin 8.8 (*)    HCT 26.6 (*)    Platelets 63 (*)    Neutro Abs 14.6 (*)    Monocytes Absolute 0.0 (*)    Abs Immature Granulocytes 0.08 (*)    All other components within normal limits  COMPREHENSIVE METABOLIC PANEL - Abnormal; Notable for the following components:   BUN 21 (*)    Calcium 7.9 (*)    Total Protein 6.0 (*)    Albumin 2.3 (*)    AST 89 (*)    ALT 52 (*)    Alkaline Phosphatase 295 (*)    All other components within normal limits  LIPASE, BLOOD - Abnormal; Notable for the following components:   Lipase 533 (*)    All other components within normal limits  TROPONIN I (HIGH SENSITIVITY) - Abnormal; Notable for the following components:   Troponin I (High Sensitivity) 2,813 (*)    All other components within normal limits  TROPONIN I (HIGH SENSITIVITY) - Abnormal; Notable for the following components:   Troponin I (High Sensitivity) 3,488 (*)     All other components within normal limits  SARS CORONAVIRUS 2 (TAT 6-24 HRS)  PROCALCITONIN  APTT  PROTIME-INR  URINALYSIS, COMPLETE (UACMP) WITH MICROSCOPIC  CBC  HEPARIN LEVEL (UNFRACTIONATED)  APTT   ____________________________________________  EKG  ED ECG REPORT I, Blake Divine, the attending physician, personally viewed and interpreted this ECG.   Date: 03/18/2021  EKG Time: 20:56  Rate: 122  Rhythm: Sinus Tachycardia  Axis: Normal  Intervals:none  ST&T Change: None   PROCEDURES  Procedure(s) performed (including Critical Care):  .Critical Care Performed by: Blake Divine, MD Authorized by: Blake Divine, MD   Critical care provider statement:    Critical care time (minutes):  75   Critical care time was exclusive of:  Separately billable procedures and treating other patients and teaching time   Critical care was necessary to treat or prevent imminent or life-threatening deterioration of the following conditions:  Circulatory failure   Critical care was time spent personally by me on the following activities:  Discussions with consultants, evaluation of patient's response to treatment, examination of patient, ordering and performing treatments and interventions, ordering and review of laboratory studies, ordering and review of radiographic studies, pulse oximetry, re-evaluation of patient's condition, obtaining history from patient or surrogate and review of old charts   I assumed direction of critical care for this patient from another provider in my specialty: no     Care discussed with: admitting provider       ____________________________________________   INITIAL IMPRESSION / ASSESSMENT AND PLAN / ED COURSE       60 year old female with widely metastatic lung cancer, COPD, hepatitis C, and polysubstance abuse who presents to the ED complaining of diffuse abdominal pain with nausea and vomiting starting after leaving the hospital earlier today.   Patient in significant pain, also reportedly hypoxic with EMS but now maintaining O2 sats on 4 L nasal cannula and denies shortness of breath.  She is tachycardic and appears dehydrated, we will hydrate with IV fluids.  Chest x-ray reviewed by me and concerning for multifocal pneumonia versus changes related to chemotherapy, we will cover with antibiotics for now.  Labs remarkable for markedly elevated troponin, no ischemic changes noted on EKG and I have high suspicion for PE.  CTA is positive for bilateral PE but no signs of right heart strain by CT scan.  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  These findings were discussed with Dr. Delana Meyer of vascular surgery and unfortunately patient's treatment options are very limited.  Risks and benefits of heparin discussed with patient as well as her sister at bedside and we will proceed with heparin despite significant risk of bleeding as there are no other options to treat her PE and renal infarct.  She would benefit from palliative care consultation and potentially hospice.  Case discussed with hospitalist for admission.      ____________________________________________   FINAL CLINICAL IMPRESSION(S) / ED DIAGNOSES  Final diagnoses:  Primary malignant neoplasm of lung metastatic to other site, unspecified laterality ([place])  Acute pulmonary embolism with acute cor pulmonale, unspecified pulmonary embolism type [facility])  Renal infarction [facility])     ED Discharge Orders    None       Note:  This document was prepared using Dragon voice recognition software and may include unintentional dictation errors.   Blake Divine, MD 03/18/21 [phone]

## 2021-03-18 ENCOUNTER — Other Ambulatory Visit: Payer: Medicaid Other

## 2021-03-18 ENCOUNTER — Ambulatory Visit: Payer: Medicaid Other | Admitting: Oncology

## 2021-03-18 ENCOUNTER — Encounter: Payer: Self-pay | Admitting: Oncology

## 2021-03-18 ENCOUNTER — Ambulatory Visit: Payer: Medicaid Other

## 2021-03-18 DIAGNOSIS — C3491 Malignant neoplasm of unspecified part of right bronchus or lung: Secondary | ICD-10-CM | POA: Insufficient documentation

## 2021-03-18 DIAGNOSIS — K851 Biliary acute pancreatitis without necrosis or infection: Secondary | ICD-10-CM | POA: Diagnosis not present

## 2021-03-18 DIAGNOSIS — C7951 Secondary malignant neoplasm of bone: Secondary | ICD-10-CM | POA: Diagnosis present

## 2021-03-18 DIAGNOSIS — D6481 Anemia due to antineoplastic chemotherapy: Secondary | ICD-10-CM

## 2021-03-18 DIAGNOSIS — Z515 Encounter for palliative care: Secondary | ICD-10-CM

## 2021-03-18 DIAGNOSIS — J189 Pneumonia, unspecified organism: Secondary | ICD-10-CM

## 2021-03-18 DIAGNOSIS — D72829 Elevated white blood cell count, unspecified: Secondary | ICD-10-CM | POA: Diagnosis not present

## 2021-03-18 DIAGNOSIS — N280 Ischemia and infarction of kidney: Secondary | ICD-10-CM | POA: Diagnosis not present

## 2021-03-18 DIAGNOSIS — J9601 Acute respiratory failure with hypoxia: Secondary | ICD-10-CM | POA: Diagnosis present

## 2021-03-18 DIAGNOSIS — C7931 Secondary malignant neoplasm of brain: Secondary | ICD-10-CM

## 2021-03-18 DIAGNOSIS — C3490 Malignant neoplasm of unspecified part of unspecified bronchus or lung: Secondary | ICD-10-CM | POA: Diagnosis not present

## 2021-03-18 DIAGNOSIS — I2699 Other pulmonary embolism without acute cor pulmonale: Secondary | ICD-10-CM | POA: Diagnosis not present

## 2021-03-18 DIAGNOSIS — D6869 Other thrombophilia: Secondary | ICD-10-CM | POA: Diagnosis present

## 2021-03-18 DIAGNOSIS — Y929 Unspecified place or not applicable: Secondary | ICD-10-CM | POA: Diagnosis not present

## 2021-03-18 DIAGNOSIS — Z66 Do not resuscitate: Secondary | ICD-10-CM | POA: Diagnosis present

## 2021-03-18 DIAGNOSIS — T380X5A Adverse effect of glucocorticoids and synthetic analogues, initial encounter: Secondary | ICD-10-CM | POA: Diagnosis not present

## 2021-03-18 DIAGNOSIS — J440 Chronic obstructive pulmonary disease with acute lower respiratory infection: Secondary | ICD-10-CM | POA: Diagnosis present

## 2021-03-18 DIAGNOSIS — C787 Secondary malignant neoplasm of liver and intrahepatic bile duct: Secondary | ICD-10-CM | POA: Diagnosis present

## 2021-03-18 DIAGNOSIS — T451X5A Adverse effect of antineoplastic and immunosuppressive drugs, initial encounter: Secondary | ICD-10-CM

## 2021-03-18 DIAGNOSIS — F32A Depression, unspecified: Secondary | ICD-10-CM | POA: Diagnosis present

## 2021-03-18 DIAGNOSIS — I10 Essential (primary) hypertension: Secondary | ICD-10-CM | POA: Diagnosis present

## 2021-03-18 DIAGNOSIS — R748 Abnormal levels of other serum enzymes: Secondary | ICD-10-CM | POA: Diagnosis present

## 2021-03-18 DIAGNOSIS — Y92239 Unspecified place in hospital as the place of occurrence of the external cause: Secondary | ICD-10-CM | POA: Diagnosis not present

## 2021-03-18 DIAGNOSIS — F419 Anxiety disorder, unspecified: Secondary | ICD-10-CM | POA: Diagnosis present

## 2021-03-18 DIAGNOSIS — L89152 Pressure ulcer of sacral region, stage 2: Secondary | ICD-10-CM | POA: Diagnosis present

## 2021-03-18 DIAGNOSIS — D696 Thrombocytopenia, unspecified: Secondary | ICD-10-CM

## 2021-03-18 DIAGNOSIS — Z20822 Contact with and (suspected) exposure to covid-19: Secondary | ICD-10-CM | POA: Diagnosis present

## 2021-03-18 DIAGNOSIS — C342 Malignant neoplasm of middle lobe, bronchus or lung: Secondary | ICD-10-CM | POA: Diagnosis not present

## 2021-03-18 DIAGNOSIS — D630 Anemia in neoplastic disease: Secondary | ICD-10-CM | POA: Diagnosis present

## 2021-03-18 DIAGNOSIS — G893 Neoplasm related pain (acute) (chronic): Secondary | ICD-10-CM | POA: Diagnosis not present

## 2021-03-18 DIAGNOSIS — G629 Polyneuropathy, unspecified: Secondary | ICD-10-CM | POA: Diagnosis present

## 2021-03-18 DIAGNOSIS — D65 Disseminated intravascular coagulation [defibrination syndrome]: Secondary | ICD-10-CM | POA: Diagnosis present

## 2021-03-18 DIAGNOSIS — I2609 Other pulmonary embolism with acute cor pulmonale: Secondary | ICD-10-CM | POA: Diagnosis not present

## 2021-03-18 LAB — CBC
HCT: 25.2 % — ABNORMAL LOW (ref 36.0–46.0)
Hemoglobin: 8.1 g/dL — ABNORMAL LOW (ref 12.0–15.0)
MCH: 27.1 pg (ref 26.0–34.0)
MCHC: 32.1 g/dL (ref 30.0–36.0)
MCV: 84.3 fL (ref 80.0–100.0)
Platelets: 49 10*3/uL — ABNORMAL LOW (ref 150–400)
RBC: 2.99 MIL/uL — ABNORMAL LOW (ref 3.87–5.11)
RDW: 14.6 % (ref 11.5–15.5)
WBC: 12.7 10*3/uL — ABNORMAL HIGH (ref 4.0–10.5)
nRBC: 0 % (ref 0.0–0.2)

## 2021-03-18 LAB — URINALYSIS, COMPLETE (UACMP) WITH MICROSCOPIC
Bacteria, UA: NONE SEEN
Bilirubin Urine: NEGATIVE
Glucose, UA: NEGATIVE mg/dL
Ketones, ur: NEGATIVE mg/dL
Nitrite: NEGATIVE
Protein, ur: NEGATIVE mg/dL
Specific Gravity, Urine: 1.03 (ref 1.005–1.030)
Squamous Epithelial / HPF: NONE SEEN (ref 0–5)
pH: 7 (ref 5.0–8.0)

## 2021-03-18 LAB — HEPARIN LEVEL (UNFRACTIONATED)
Heparin Unfractionated: 0.47 IU/mL (ref 0.30–0.70)
Heparin Unfractionated: 0.2 IU/mL — ABNORMAL LOW (ref 0.30–0.70)

## 2021-03-18 LAB — BASIC METABOLIC PANEL
Anion gap: 9 (ref 5–15)
BUN: 17 mg/dL (ref 6–20)
CO2: 22 mmol/L (ref 22–32)
Calcium: 7.5 mg/dL — ABNORMAL LOW (ref 8.9–10.3)
Chloride: 105 mmol/L (ref 98–111)
Creatinine, Ser: 0.54 mg/dL (ref 0.44–1.00)
GFR, Estimated: >60 mL/min (ref >60)
Glucose, Bld: 80 mg/dL (ref 70–99)
Potassium: 3.5 mmol/L (ref 3.5–5.1)
Sodium: 136 mmol/L (ref 135–145)

## 2021-03-18 LAB — APTT: aPTT: 28 seconds (ref 24–36)

## 2021-03-18 LAB — SARS CORONAVIRUS 2 (TAT 6-24 HRS): SARS Coronavirus 2: NEGATIVE

## 2021-03-18 MED ORDER — LORAZEPAM 2 MG/ML PO CONC
1.0000 mg | ORAL | Status: DC | PRN
  Administered 2021-03-20 – 2021-03-21 (×3): 1 mg via SUBLINGUAL
  Filled 2021-03-18 (×4): qty 1

## 2021-03-18 MED ORDER — GUAIFENESIN ER 600 MG PO TB12: 1200.0000 mg | ORAL_TABLET | Freq: Two times a day (BID) | ORAL | Status: DC

## 2021-03-18 MED ORDER — BUSPIRONE HCL 5 MG PO TABS: 10.0000 mg | ORAL_TABLET | Freq: Two times a day (BID) | ORAL | Status: DC

## 2021-03-18 MED ORDER — HALOPERIDOL LACTATE 2 MG/ML PO CONC
0.5000 mg | ORAL | Status: DC | PRN
  Filled 2021-03-18: qty 0.3

## 2021-03-18 MED ORDER — LORAZEPAM 2 MG/ML IJ SOLN
1.0000 mg | INTRAMUSCULAR | Status: DC | PRN
  Administered 2021-03-19 (×2): 1 mg via INTRAVENOUS
  Filled 2021-03-18 (×2): qty 1

## 2021-03-18 MED ORDER — DIPHENHYDRAMINE HCL 50 MG/ML IJ SOLN: 12.5000 mg | INTRAMUSCULAR | Status: DC | PRN

## 2021-03-18 MED ORDER — ACETAMINOPHEN 650 MG RE SUPP: 650.0000 mg | Freq: Four times a day (QID) | RECTAL | Status: DC | PRN

## 2021-03-18 MED ORDER — LABETALOL HCL 5 MG/ML IV SOLN: 20.0000 mg | INTRAVENOUS | Status: DC | PRN

## 2021-03-18 MED ORDER — IPRATROPIUM-ALBUTEROL 0.5-2.5 (3) MG/3ML IN SOLN: 3.0000 mL | RESPIRATORY_TRACT | Status: DC | PRN

## 2021-03-18 MED ORDER — METOPROLOL TARTRATE 25 MG PO TABS
12.5000 mg | ORAL_TABLET | Freq: Two times a day (BID) | ORAL | Status: DC
  Filled 2021-03-18: qty 1

## 2021-03-18 MED ORDER — ONDANSETRON HCL 4 MG PO TABS: 4.0000 mg | ORAL_TABLET | Freq: Four times a day (QID) | ORAL | Status: DC | PRN

## 2021-03-18 MED ORDER — LORAZEPAM 2 MG/ML IJ SOLN: 0.5000 mg | INTRAMUSCULAR | Status: DC | PRN

## 2021-03-18 MED ORDER — HALOPERIDOL 0.5 MG PO TABS
0.5000 mg | ORAL_TABLET | ORAL | Status: DC | PRN
  Filled 2021-03-18: qty 1

## 2021-03-18 MED ORDER — PANTOPRAZOLE SODIUM 40 MG PO TBEC: 40.0000 mg | DELAYED_RELEASE_TABLET | Freq: Every day | ORAL | Status: DC

## 2021-03-18 MED ORDER — ONDANSETRON HCL 4 MG/2ML IJ SOLN
4.0000 mg | Freq: Four times a day (QID) | INTRAMUSCULAR | Status: DC | PRN
Start: 2021-03-18 — End: 2021-03-22
  Administered 2021-03-18: 20:00:00 4 mg via INTRAVENOUS
  Filled 2021-03-18 (×2): qty 2

## 2021-03-18 MED ORDER — METOPROLOL TARTRATE 50 MG PO TABS: 50.0000 mg | ORAL_TABLET | Freq: Two times a day (BID) | ORAL | Status: DC

## 2021-03-18 MED ORDER — ALBUTEROL SULFATE HFA 108 (90 BASE) MCG/ACT IN AERS
2.0000 | INHALATION_SPRAY | RESPIRATORY_TRACT | Status: DC | PRN
  Filled 2021-03-18: qty 6.7

## 2021-03-18 MED ORDER — MORPHINE SULFATE (PF) 2 MG/ML IV SOLN
2.0000 mg | INTRAVENOUS | Status: DC | PRN
  Administered 2021-03-18 – 2021-03-20 (×5): 2 mg via INTRAVENOUS
  Filled 2021-03-18 (×6): qty 1

## 2021-03-18 MED ORDER — PROMETHAZINE HCL 25 MG PO TABS: 12.5000 mg | ORAL_TABLET | Freq: Four times a day (QID) | ORAL | Status: DC | PRN

## 2021-03-18 MED ORDER — GABAPENTIN 600 MG PO TABS
300.0000 mg | ORAL_TABLET | Freq: Every day | ORAL | Status: DC
  Administered 2021-03-20 – 2021-03-21 (×2): 300 mg via ORAL
  Filled 2021-03-18 (×2): qty 1

## 2021-03-18 MED ORDER — POTASSIUM CHLORIDE CRYS ER 20 MEQ PO TBCR: 20.0000 meq | EXTENDED_RELEASE_TABLET | Freq: Every day | ORAL | Status: DC

## 2021-03-18 MED ORDER — DM-GUAIFENESIN ER 30-600 MG PO TB12: 1.0000 | ORAL_TABLET | Freq: Two times a day (BID) | ORAL | Status: DC | PRN

## 2021-03-18 MED ORDER — ONDANSETRON HCL 4 MG/2ML IJ SOLN
4.0000 mg | Freq: Four times a day (QID) | INTRAMUSCULAR | Status: DC | PRN
  Administered 2021-03-18: 4 mg via INTRAVENOUS
  Filled 2021-03-18: qty 2

## 2021-03-18 MED ORDER — OXYCODONE-ACETAMINOPHEN 5-325 MG PO TABS: 1.0000 | ORAL_TABLET | Freq: Four times a day (QID) | ORAL | Status: DC | PRN

## 2021-03-18 MED ORDER — SODIUM CHLORIDE 0.9 % IV SOLN: 500.0000 mg | INTRAVENOUS | Status: DC

## 2021-03-18 MED ORDER — IPRATROPIUM-ALBUTEROL 0.5-2.5 (3) MG/3ML IN SOLN
3.0000 mL | Freq: Four times a day (QID) | RESPIRATORY_TRACT | Status: DC
  Filled 2021-03-18: qty 3

## 2021-03-18 MED ORDER — HEPARIN BOLUS VIA INFUSION
4000.0000 [IU] | Freq: Once | INTRAVENOUS | Status: AC
  Administered 2021-03-18: 4000 [IU] via INTRAVENOUS
  Filled 2021-03-18: qty 4000

## 2021-03-18 MED ORDER — GLYCOPYRROLATE 0.2 MG/ML IJ SOLN
0.2000 mg | INTRAMUSCULAR | Status: DC | PRN
  Administered 2021-03-18 – 2021-03-20 (×5): 0.2 mg via INTRAVENOUS
  Filled 2021-03-18 (×6): qty 1

## 2021-03-18 MED ORDER — FENTANYL 25 MCG/HR TD PT72
1.0000 | MEDICATED_PATCH | TRANSDERMAL | Status: DC
  Administered 2021-03-18 – 2021-03-21 (×2): 1 via TRANSDERMAL
  Filled 2021-03-18 (×3): qty 1

## 2021-03-18 MED ORDER — FUROSEMIDE 40 MG PO TABS: 20.0000 mg | ORAL_TABLET | Freq: Every day | ORAL | Status: DC

## 2021-03-18 MED ORDER — HYDROMORPHONE HCL 1 MG/ML IJ SOLN
0.5000 mg | INTRAMUSCULAR | Status: DC | PRN
  Administered 2021-03-18 (×2): 1 mg via INTRAVENOUS
  Administered 2021-03-19: 14:00:00 0.5 mg via INTRAVENOUS
  Administered 2021-03-20 (×2): 1 mg via INTRAVENOUS
  Filled 2021-03-18 (×5): qty 1

## 2021-03-18 MED ORDER — ONDANSETRON 4 MG PO TBDP
4.0000 mg | ORAL_TABLET | Freq: Four times a day (QID) | ORAL | Status: DC | PRN
  Filled 2021-03-18 (×2): qty 1

## 2021-03-18 MED ORDER — QUETIAPINE FUMARATE 25 MG PO TABS
200.0000 mg | ORAL_TABLET | Freq: Every day | ORAL | Status: DC
  Administered 2021-03-20 – 2021-03-21 (×2): 200 mg via ORAL
  Filled 2021-03-18 (×2): qty 8

## 2021-03-18 MED ORDER — SODIUM CHLORIDE 0.9 % IV SOLN
2.0000 g | INTRAVENOUS | Status: DC
  Administered 2021-03-18: 2 g via INTRAVENOUS
  Filled 2021-03-18: qty 20

## 2021-03-18 MED ORDER — HALOPERIDOL LACTATE 5 MG/ML IJ SOLN
0.5000 mg | INTRAMUSCULAR | Status: DC | PRN
Start: 2021-03-18 — End: 2021-03-22
  Administered 2021-03-18 – 2021-03-20 (×6): 0.5 mg via INTRAVENOUS
  Filled 2021-03-18 (×3): qty 1

## 2021-03-18 MED ORDER — LIDOCAINE-PRILOCAINE 2.5-2.5 % EX CREA
1.0000 "application " | TOPICAL_CREAM | Freq: Every day | CUTANEOUS | Status: DC | PRN
  Filled 2021-03-18: qty 5

## 2021-03-18 MED ORDER — LORAZEPAM 1 MG PO TABS: 1.0000 mg | ORAL_TABLET | ORAL | Status: DC | PRN

## 2021-03-18 MED ORDER — SODIUM CHLORIDE 0.9 % IV SOLN: INTRAVENOUS | Status: DC

## 2021-03-18 MED ORDER — TRAZODONE HCL 50 MG PO TABS: 25.0000 mg | ORAL_TABLET | Freq: Every evening | ORAL | Status: DC | PRN

## 2021-03-18 MED ORDER — ACETAMINOPHEN 325 MG PO TABS: 650.0000 mg | ORAL_TABLET | Freq: Four times a day (QID) | ORAL | Status: DC | PRN

## 2021-03-18 MED ORDER — HYDROMORPHONE HCL 1 MG/ML IJ SOLN
0.5000 mg | Freq: Once | INTRAMUSCULAR | Status: AC
Start: 2021-03-18 — End: 2021-03-17
  Filled 2021-03-18: qty 1

## 2021-03-18 MED ORDER — HEPARIN (PORCINE) 25000 UT/250ML-% IV SOLN
800.0000 [IU]/h | INTRAVENOUS | Status: DC
  Administered 2021-03-18: 800 [IU]/h via INTRAVENOUS
  Filled 2021-03-18: qty 250

## 2021-03-18 MED ORDER — GLYCOPYRROLATE 0.2 MG/ML IJ SOLN
0.2000 mg | INTRAMUSCULAR | Status: DC | PRN
  Filled 2021-03-18: qty 1

## 2021-03-18 MED ORDER — FOLIC ACID 1 MG PO TABS: 1.0000 mg | ORAL_TABLET | Freq: Every day | ORAL | Status: DC

## 2021-03-18 MED ORDER — DEXAMETHASONE 4 MG PO TABS
4.0000 mg | ORAL_TABLET | Freq: Two times a day (BID) | ORAL | Status: DC
  Filled 2021-03-18 (×2): qty 1

## 2021-03-18 MED ORDER — GLYCOPYRROLATE 1 MG PO TABS
1.0000 mg | ORAL_TABLET | ORAL | Status: DC | PRN
  Filled 2021-03-18 (×2): qty 1

## 2021-03-18 MED ORDER — LORAZEPAM 2 MG/ML IJ SOLN
0.5000 mg | INTRAMUSCULAR | Status: DC | PRN
  Administered 2021-03-18: 0.5 mg via INTRAVENOUS
  Filled 2021-03-18 (×2): qty 1

## 2021-03-18 MED ORDER — BIOTENE DRY MOUTH MT LIQD
15.0000 mL | OROMUCOSAL | Status: DC | PRN
  Filled 2021-03-18: qty 15

## 2021-03-18 MED ORDER — POLYVINYL ALCOHOL 1.4 % OP SOLN
1.0000 [drp] | Freq: Four times a day (QID) | OPHTHALMIC | Status: DC | PRN
  Filled 2021-03-18: qty 15

## 2021-03-18 MED ORDER — MAGIC MOUTHWASH: 5.0000 mL | Freq: Three times a day (TID) | ORAL | Status: DC | PRN

## 2021-03-18 MED ORDER — MAGNESIUM HYDROXIDE 400 MG/5ML PO SUSP: 30.0000 mL | Freq: Every day | ORAL | Status: DC | PRN

## 2021-03-18 MED ORDER — CLONAZEPAM 0.5 MG PO TABS: 0.5000 mg | ORAL_TABLET | Freq: Two times a day (BID) | ORAL | Status: DC | PRN

## 2021-03-18 MED ORDER — OXYCODONE HCL 5 MG PO TABS
5.0000 mg | ORAL_TABLET | ORAL | Status: DC | PRN
Start: 2021-03-18 — End: 2021-03-22
  Administered 2021-03-21 – 2021-03-22 (×4): 5 mg via ORAL
  Filled 2021-03-18 (×4): qty 1

## 2021-03-18 NOTE — ED Notes (Signed)
Pt sister called out stating that pt is in pain and needs something for pain

## 2021-03-18 NOTE — H&P (Incomplete)
[facility]   PATIENT NAME: Brenda Middleton    MR#:  [id number]  DATE OF BIRTH:  12/24/1960  DATE OF ADMISSION:  03/17/2021  PRIMARY CARE PHYSICIAN: Remi Haggard, FNP   Patient is coming from: Home  REQUESTING/REFERRING PHYSICIAN: Blake Divine, MD  CHIEF COMPLAINT:   Chief Complaint  Patient presents with  . Abdominal Pain  . Nausea    HISTORY OF PRESENT ILLNESS:  Brenda Middleton is a 60 y.o. female with medical history significant for metastatic lung cancer, and anxiety and depression and hypertension, who was just admitted here on 4/12 for toxic encephalopathy with polypharmacy bibasal infiltrates and possible pneumonia, hypokalemia and hypomagnesemia and was discharged yesterday as she wanted to go home.  Unfortunately when she went home she started having abdominal pain with associated nausea without vomiting.  She denied any worsening dyspnea or cough or wheezing.  No fever or chills.  No chest pain or palpitations.  She admitted to back pain.  No bleeding diathesis.  ED Course: *** EKG as reviewed by me : *** Imaging: *** PAST MEDICAL HISTORY:   Past Medical History:  Diagnosis Date  . Anxiety   . Benign neoplasm of cervix uteri   . Chronic hepatitis C without mention of hepatic coma   . Chronic low back pain 08/26/2014  . Constipation   . Depressive disorder   . Displacement of cervical intervertebral disc   . Hypertensive disorder   . Iron deficiency anemia due to chronic blood loss 09/12/2019  . Palpitations   . Prolapsed cervical intervertebral disc     PAST SURGICAL HISTORY:   Past Surgical History:  Procedure Laterality Date  . CONIZATION CERVIX    . DILATION AND CURETTAGE OF UTERUS    . HAND SURGERY  2008,2015   Carpel Tunnel  . TONSILLECTOMY    . VULVECTOMY      SOCIAL HISTORY:   Social History   Tobacco Use  . Smoking status: Former Smoker    Packs/day: 0.70    Years: 35.00    Pack years: 24.50    Types: Cigarettes  .  Smokeless tobacco: Never Used  Substance Use Topics  . Alcohol use: Yes    Comment: occasional wine    FAMILY HISTORY:   Family History  Problem Relation Age of Onset  . Breast cancer Mother 70  . Lung cancer Father   . Cancer Brother   . Cancer Other   . Stroke Other     DRUG ALLERGIES:   Allergies  Allergen Reactions  . Penicillin G Hives    Other reaction(s): HIVES Other reaction(s): HIVES  . Penicillin V Itching  . Penicillins     REVIEW OF SYSTEMS:   ROS As per history of present illness. All pertinent systems were reviewed above. Constitutional, HEENT, cardiovascular, respiratory, GI, GU, musculoskeletal, neuro, psychiatric, endocrine, integumentary and hematologic systems were reviewed and are otherwise negative/unremarkable except for positive findings mentioned above in the HPI.   MEDICATIONS AT HOME:   Prior to Admission medications   Medication Sig Start Date End Date Taking? Authorizing Provider  albuterol (VENTOLIN HFA) 108 (90 Base) MCG/ACT inhaler Inhale 2 puffs into the lungs every 4 (four) hours as needed for wheezing or shortness of breath. 02/21/21  Yes Danford, Suann Larry, MD  busPIRone (BUSPAR) 10 MG tablet Take 1 tablet (10 mg total) by mouth 2 (two) times daily. 03/07/21  Yes Earlie Server, MD  budesonide-formoterol [facility]) 80-4.5 MCG/ACT inhaler Inhale 2  puffs into the lungs 2 (two) times daily.    [provider]  clonazePAM (KLONOPIN) 0.5 MG tablet Take 1 tablet (0.5 mg total) by mouth 2 (two) times daily as needed (anxiety). 03/17/21   Domenic Polite, MD  dexamethasone (DECADRON) 4 MG tablet Take 1 tablet (4 mg total) by mouth 2 (two) times daily. 03/02/21   Oswald Hillock, MD  fentaNYL (DURAGESIC) 25 MCG/HR Place 1 patch onto the skin every 3 (three) days. 03/20/21   Domenic Polite, MD  folic acid (FOLVITE) 1 MG tablet Take 1 tablet (1 mg total) by mouth daily. Start 5-7 days before Alimta chemotherapy. Continue until 21 days after Alimta  completed. 03/04/21   Earlie Server, MD  furosemide (LASIX) 20 MG tablet Take 1 tablet (20 mg total) by mouth daily. 03/17/21   Domenic Polite, MD  gabapentin (NEURONTIN) 600 MG tablet Take 0.5 tablets (300 mg total) by mouth at bedtime. 03/17/21   Domenic Polite, MD  guaiFENesin (MUCINEX) 600 MG 12 hr tablet Take 2 tablets (1,200 mg total) by mouth 2 (two) times daily. 03/02/21   Oswald Hillock, MD  ipratropium-albuterol (DUONEB) 0.5-2.5 (3) MG/3ML SOLN Take 3 mLs by nebulization every 6 (six) hours as needed. 03/08/21   Borders, Kirt Boys, NP  lidocaine-prilocaine (EMLA) cream Apply to affected area once Patient taking differently: Apply 1 application topically daily as needed. Apply to affected area once 03/04/21   Earlie Server, MD  magic mouthwash SOLN Take 5 mLs by mouth 3 (three) times daily as needed for mouth pain. 03/09/21   Earlie Server, MD  metoprolol tartrate (LOPRESSOR) 25 MG tablet Take 0.5 tablets (12.5 mg total) by mouth 2 (two) times daily. 03/02/21 06/30/21  Oswald Hillock, MD  oxyCODONE-acetaminophen (PERCOCET/ROXICET) 5-325 MG tablet Take 1-2 tablets by mouth every 6 (six) hours as needed for severe pain. 03/17/21   Domenic Polite, MD  pantoprazole (PROTONIX) 40 MG tablet Take 40 mg by mouth daily. 02/08/21   [provider]  potassium chloride SA (KLOR-CON) 20 MEQ tablet Take 1 tablet (20 mEq total) by mouth daily. 03/17/21   Domenic Polite, MD  promethazine (PHENERGAN) 12.5 MG tablet Take 1 tablet (12.5 mg total) by mouth every 6 (six) hours as needed for nausea or vomiting. 03/07/21   Earlie Server, MD  QUEtiapine (SEROQUEL) 400 MG tablet Take 0.5 tablets (200 mg total) by mouth at bedtime. 03/17/21   Domenic Polite, MD  amitriptyline (ELAVIL) 25 MG tablet Take 2 tablets (50 mg total) by mouth at bedtime. Patient not taking: No sig reported 08/26/14 01/01/21  Kathrynn Ducking, MD  temazepam (RESTORIL) 30 MG capsule Take 30 mg by mouth at bedtime.  01/01/21  [provider]      VITAL SIGNS:   Blood pressure (!) 161/98, pulse (!) 121, temperature 98.4 F (36.9 C), temperature source Oral, resp. rate 12, height 5\' 6"  (1.676 m), weight 50.7 kg, SpO2 96 %.  PHYSICAL EXAMINATION:  Physical Exam  GENERAL:  60 y.o.-year-old patient lying in the bed with no acute distress.  EYES: Pupils equal, round, reactive to light and accommodation. No scleral icterus. Extraocular muscles intact.  HEENT: Head atraumatic, normocephalic. Oropharynx and nasopharynx clear.  NECK:  Supple, no jugular venous distention. No thyroid enlargement, no tenderness.  LUNGS: Normal breath sounds bilaterally, no wheezing, rales,rhonchi or crepitation. No use of accessory muscles of respiration.  CARDIOVASCULAR: Regular rate and rhythm, S1, S2 normal. No murmurs, rubs, or gallops.  ABDOMEN: Soft, nondistended, nontender. Bowel sounds  present. No organomegaly or mass.  EXTREMITIES: No pedal edema, cyanosis, or clubbing.  NEUROLOGIC: Cranial nerves II through XII are intact. Muscle strength 5/5 in all extremities. Sensation intact. Gait not checked.  PSYCHIATRIC: The patient is alert and oriented x 3.  Normal affect and good eye contact. SKIN: No obvious rash, lesion, or ulcer.   LABORATORY PANEL:   CBC Recent Labs  Lab 03/17/21 2051  WBC 16.2*  HGB 8.8*  HCT 26.6*  PLT 63*   ------------------------------------------------------------------------------------------------------------------  Chemistries  Recent Labs  Lab 03/14/21 0817 03/15/21 1109 03/17/21 2051  NA 139   < > 135  K 2.6*   < > 4.0  CL 100   < > 103  CO2 26   < > 22  GLUCOSE 127*   < > 82  BUN 30*   < > 21*  CREATININE 1.02*   < > 0.70  CALCIUM 8.4*   < > 7.9*  MG 1.7  --   --   AST 30   < > 89*  ALT 30   < > 52*  ALKPHOS 331*   < > 295*  BILITOT 0.5   < > 1.0   < > = values in this interval not displayed.    ------------------------------------------------------------------------------------------------------------------  Cardiac Enzymes No results for input(s): TROPONINI in the last 168 hours. ------------------------------------------------------------------------------------------------------------------  RADIOLOGY:  CT Angio Chest PE W/Cm &/Or Wo Cm  Addendum Date: 03/17/2021   ADDENDUM REPORT: 03/17/2021 23:51 ADDENDUM: Critical Value/emergent results were called by telephone at the time of interpretation on 03/17/2021 at 11:46 pm to provider [facility] , who verbally acknowledged these results. Electronically Signed   By: Ulyses Jarred M.D.   On: 03/17/2021 23:51   Result Date: 03/17/2021 CLINICAL DATA:  Stage IV metastatic lung carcinoma. Discharged from ICU today. Lower abdominal pain after enema. EXAM: CT ANGIOGRAPHY CHEST CT ABDOMEN AND PELVIS WITH CONTRAST TECHNIQUE: Multidetector CT imaging of the chest was performed using the standard protocol during bolus administration of intravenous contrast. Multiplanar CT image reconstructions and MIPs were obtained to evaluate the vascular anatomy. Multidetector CT imaging of the abdomen and pelvis was performed using the standard protocol during bolus administration of intravenous contrast. CONTRAST:  66mL OMNIPAQUE IOHEXOL 350 MG/ML SOLN COMPARISON:  None. FINDINGS: CTA CHEST FINDINGS Cardiovascular: Contrast injection is sufficient to demonstrate satisfactory opacification of the pulmonary arteries to the segmental level.There are filling defects within the proximal right middle lobar artery and within the posterior segmental branches of the right lower lobe. On the left, there are filling defects within the left lower lobe segmental branches. The main pulmonary artery is within normal limits for size. There is no CT evidence of acute right heart strain. Mild calcific aortic atherosclerosis. Aorta is otherwise normal. There is a normal 3-vessel  arch branching pattern. Heart size is normal, without pericardial effusion. Mediastinum/Nodes: Unchanged size of right hilar mass measuring 4.3 x 3.1 cm. Left hilar nodes are unchanged. Confluent pretracheal adenopathy also unchanged. Lungs/Pleura: Widespread ground-glass opacities are new since the prior study. Right middle lobe nodule (image 48) has decreased in size measuring 2.0 x 1.7 cm, previously 2.6 x 2.7 cm. There are small bilateral pleural effusions. 4 mm nodule in the left upper lobe is unchanged (image 42). Musculoskeletal: Sclerotic focus in the right aspect of the T5 vertebral body has markedly increased in size. Lytic lesion at T9 is unchanged. T7 lucent lesion is also unchanged. Review of the MIP images confirms the above findings. CT ABDOMEN  and PELVIS FINDINGS Hepatobiliary: Unchanged hypodensity in the left hepatic lobe (2:12 measuring 12 mm. There is faint peripheral enhancement. There is also a small area of hyperenhancement more laterally at the same level (see aero). This is at the site of the hypodense focus seen on the previous CT. Subcapsular hypodensity with peripheral enhancement is unchanged (2:20). Status post cholecystectomy. Pancreas: Normal contours without ductal dilatation. No peripancreatic fluid collection. Spleen: Normal. Adrenals/Urinary Tract: --Adrenal glands: Normal. 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 --Left kidney/ureter: Proximal left ureter is dilated. There is focal hypoattenuation at the lower pole of the left kidney with suspected tumor thrombus in the lower branches of the left renal artery. --Urinary bladder: Dilated Stomach/Bowel: --Stomach/Duodenum: No hiatal hernia or other gastric abnormality. Normal duodenal course and caliber. --Small bowel: No dilatation or inflammation. --Colon: No focal abnormality. --Appendix: Normal. 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 There is calcific atherosclerosis of the aorta. The portal vein, splenic vein and superior mesenteric vein are patent. The IVC is patent. There is a 12 mm retrocaval lymph node, unchanged. Multiple subcentimeter retroperitoneal nodes. Reproductive: There is a small amount of free fluid in the pelvis. Normal uterus. No adnexal mass. Musculoskeletal. Mixed density appearance of L2 is unchanged. Small sclerotic focus within the S1 body has slightly increased in size. Other: None. IMPRESSION: 1. Acute pulmonary emboli within multiple bilateral lobar and segmental branches. No CT evidence of acute right heart strain. 2. Widespread ground-glass opacities, new since the prior study. This could indicate infection or post treatment change. Lymphangitic tumor spread is also possible. New small bilateral pleural effusions. 3. Unchanged size of right hilar mass and mediastinal lymphadenopathy. Right middle lobe dominant nodule has decreased in size. 4. 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 Unchanged appearance of liver metastases allowing for differences in contrast timing. 6. Multiple osseous metastases, worsened clearly at T5. Aortic Atherosclerosis (ICD10-I70.0). Electronically Signed: By: Ulyses Jarred M.D. On: 03/17/2021 23:39   CT Abdomen Pelvis W Contrast  Addendum Date: 03/17/2021   ADDENDUM REPORT: 03/17/2021 23:51 ADDENDUM: Critical Value/emergent results were called by telephone at the time of interpretation on 03/17/2021 at 11:46 pm to provider [facility] , who verbally acknowledged these results. Electronically Signed   By: Ulyses Jarred M.D.   On: 03/17/2021 23:51   Result Date: 03/17/2021 CLINICAL DATA:  Stage IV metastatic lung carcinoma. Discharged from ICU today. Lower abdominal pain after enema. EXAM: CT ANGIOGRAPHY CHEST CT ABDOMEN AND PELVIS WITH CONTRAST TECHNIQUE: Multidetector CT imaging  of the chest was performed using the standard protocol during bolus administration of intravenous contrast. Multiplanar CT image reconstructions and MIPs were obtained to evaluate the vascular anatomy. Multidetector CT imaging of the abdomen and pelvis was performed using the standard protocol during bolus administration of intravenous contrast. CONTRAST:  39mL OMNIPAQUE IOHEXOL 350 MG/ML SOLN COMPARISON:  None. FINDINGS: CTA CHEST FINDINGS Cardiovascular: Contrast injection is sufficient to demonstrate satisfactory opacification of the pulmonary arteries to the segmental level.There are filling defects within the proximal right middle lobar artery and within the posterior segmental branches of the right lower lobe. On the left, there are filling defects within the left lower lobe segmental branches. The main pulmonary artery is within normal limits for size. There is no CT evidence of acute right heart strain. Mild calcific aortic atherosclerosis. Aorta is otherwise normal. There is a normal  3-vessel arch branching pattern. Heart size is normal, without pericardial effusion. Mediastinum/Nodes: Unchanged size of right hilar mass measuring 4.3 x 3.1 cm. Left hilar nodes are unchanged. Confluent pretracheal adenopathy also unchanged. Lungs/Pleura: Widespread ground-glass opacities are new since the prior study. Right middle lobe nodule (image 48) has decreased in size measuring 2.0 x 1.7 cm, previously 2.6 x 2.7 cm. There are small bilateral pleural effusions. 4 mm nodule in the left upper lobe is unchanged (image 42). Musculoskeletal: Sclerotic focus in the right aspect of the T5 vertebral body has markedly increased in size. Lytic lesion at T9 is unchanged. T7 lucent lesion is also unchanged. Review of the MIP images confirms the above findings. CT ABDOMEN and PELVIS FINDINGS Hepatobiliary: Unchanged hypodensity in the left hepatic lobe (2:12 measuring 12 mm. There is faint peripheral enhancement. There is also a  small area of hyperenhancement more laterally at the same level (see aero). This is at the site of the hypodense focus seen on the previous CT. Subcapsular hypodensity with peripheral enhancement is unchanged (2:20). Status post cholecystectomy. Pancreas: Normal contours without ductal dilatation. No peripancreatic fluid collection. Spleen: Normal. Adrenals/Urinary Tract: --Adrenal glands: Normal. 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 --Left kidney/ureter: Proximal left ureter is dilated. There is focal hypoattenuation at the lower pole of the left kidney with suspected tumor thrombus in the lower branches of the left renal artery. --Urinary bladder: Dilated Stomach/Bowel: --Stomach/Duodenum: No hiatal hernia or other gastric abnormality. Normal duodenal course and caliber. --Small bowel: No dilatation or inflammation. --Colon: No focal abnormality. --Appendix: Normal. 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 There is calcific atherosclerosis of the aorta. The portal vein, splenic vein and superior mesenteric vein are patent. The IVC is patent. There is a 12 mm retrocaval lymph node, unchanged. Multiple subcentimeter retroperitoneal nodes. Reproductive: There is a small amount of free fluid in the pelvis. Normal uterus. No adnexal mass. Musculoskeletal. Mixed density appearance of L2 is unchanged. Small sclerotic focus within the S1 body has slightly increased in size. Other: None. IMPRESSION: 1. Acute pulmonary emboli within multiple bilateral lobar and segmental branches. No CT evidence of acute right heart strain. 2. Widespread ground-glass opacities, new since the prior study. This could indicate infection or post treatment change. Lymphangitic tumor spread is also possible. New small bilateral pleural effusions. 3. Unchanged size of right hilar mass and mediastinal  lymphadenopathy. Right middle lobe dominant nodule has decreased in size. 4. 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 Unchanged appearance of liver metastases allowing for differences in contrast timing. 6. Multiple osseous metastases, worsened clearly at T5. Aortic Atherosclerosis (ICD10-I70.0). Electronically Signed: By: Ulyses Jarred M.D. On: 03/17/2021 23:39   DG Chest Portable 1 View  Result Date: 03/17/2021 CLINICAL DATA:  Shortness of breath EXAM: PORTABLE CHEST 1 VIEW COMPARISON:  03/15/2021 FINDINGS: Worsening bilateral opacities throughout both lungs. No pneumothorax or sizable pleural effusion. Normal cardiomediastinal contours. IMPRESSION: Worsening bilateral airspace opacities. Electronically Signed   By: Ulyses Jarred M.D.   On: 03/17/2021 21:23      IMPRESSION AND PLAN:  Active Problems:   Acute pulmonary embolism (HCC)    DVT prophylaxis: Lovenox***  Code Status: full code***  Family Communication:  The plan of care was discussed in details with the patient (and family). I answered all questions. The patient agreed to proceed with the above mentioned plan. Further management  will depend upon hospital course. Disposition Plan: Back to previous home environment Consults called: none***  All the records are reviewed and case discussed with ED provider.  Status is: Inpatient  {Inpatient:23812}  Dispo: The patient is from: {From:23814}              Anticipated d/c is to: {To:23815}              Patient currently {Medically stable:23817}   Difficult to place patient {Yes/No:25151}      TOTAL TIME TAKING CARE OF THIS PATIENT: *** minutes.    Christel Mormon M.D on 03/18/2021 at 2:24 AM  [facility]   From 7 PM-7 AM, contact night-coverage [email or web address]  CC: Primary care physician; Remi Haggard, FNP

## 2021-03-18 NOTE — ED Notes (Addendum)
Pt remains on purewick and 4 liter's [place]. IV's wrapped with coban and mittens placed for safety. Pt is sleeping, no response to painful stimuli, does not arouse as endorsed by [place], RN. Pinpoint pupils noted. Heparin continues at 800 units/hr

## 2021-03-18 NOTE — H&P (Addendum)
[place]   PATIENT NAME: Brenda Middleton    MR#:  [id number]  DATE OF BIRTH:  06/06/1961  DATE OF ADMISSION:  03/17/2021  PRIMARY CARE PHYSICIAN: Remi Haggard, FNP   Patient is coming from: Home  REQUESTING/REFERRING PHYSICIAN: Blake Divine, MD  CHIEF COMPLAINT:   Chief Complaint  Patient presents with  . Abdominal Pain  . Nausea    HISTORY OF PRESENT ILLNESS:  Brenda Middleton is a 60 y.o.  female with medical history significant for metastatic lung cancer, and anxiety and depression and hypertension, who was just admitted here on 4/12 for toxic encephalopathy with polypharmacy bibasal infiltrates and possible pneumonia, hypokalemia and hypomagnesemia and was discharged yesterday as she wanted to go home.  Unfortunately when she went home she started having abdominal pain with associated nausea and vomiting.  She was unable to keep any food or fluids down.  She denied any worsening dyspnea or cough or wheezing.  No fever or chills.  No chest pain or palpitations.  She admitted to back pain.  No bleeding diathesis.  EMS reported that the patient was hypoxic with pulse oximetry in the 80s on room air and she was therefore placed on 4 L of O2 by nasal cannula.  The patient's last chemotherapy received was 4 days ago.  ED Course:When she came to the ER, blood pressure was 159/115 with a heart rate of 134 and respiratory 21, temperature of 98.4 and pulse oximetry 96% on 4 L of O2 by nasal cannula.  Labs revealed lipase of 533, ALT 52 AST 89 and albumin 2.3 with otherwise unremarkable CMP.  CBC showed leukocytosis 16.2 up from 15.8 this morning and anemia with hemoglobin of 8.8 and hematocrit 26.6 better than earlier in the morning and platelets 63 compared to 75.  INR was 1.2 and PT 15.2.  UA was remarkable for large leukocytes.    Imaging: Chest x-ray showed worsening bilateral airspace opacities. Chest CTA and abdominal pelvic CT scan revealed the following: 1. Acute  pulmonary emboli within multiple bilateral lobar and segmental branches. No CT evidence of acute right heart strain. 2. Widespread ground-glass opacities, new since the prior study. This could indicate infection or post treatment change. Lymphangitic tumor spread is also possible. New small bilateral pleural effusions. 3. Unchanged size of right hilar mass and mediastinal lymphadenopathy. Right middle lobe dominant nodule has decreased in size. 4. 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 Unchanged appearance of liver metastases allowing for differences in contrast timing. 6. Multiple osseous metastases, worsened clearly at T5. 7.  Aortic Atherosclerosis  The patient was given IV cefepime and Zithromax, 1 L bolus of IV lactated Ringer, 4 mg of IV Zofran, 0.5 mg of IV Dilaudid and IV heparin bolus and drip.  Dr. Evelina Bucy was notified about the patient given bilateral renal artery thrombus and recommended IV heparin.  The patient will be admitted to a progressive unit bed for further evaluation and management. PAST MEDICAL HISTORY:   Past Medical History:  Diagnosis Date  . Anxiety   . Benign neoplasm of cervix uteri   . Chronic hepatitis C without mention of hepatic coma   . Chronic low back pain 08/26/2014  . Constipation   . Depressive disorder   . Displacement of cervical intervertebral disc   . Hypertensive disorder   . Iron deficiency anemia due to chronic blood loss 09/12/2019  . Palpitations   . Prolapsed cervical intervertebral  disc     PAST SURGICAL HISTORY:   Past Surgical History:  Procedure Laterality Date  . CONIZATION CERVIX    . DILATION AND CURETTAGE OF UTERUS    . HAND SURGERY  2008,2015   Carpel Tunnel  . TONSILLECTOMY    . VULVECTOMY      SOCIAL HISTORY:   Social History   Tobacco Use  . Smoking status: Former Smoker    Packs/day: 0.70    Years: 35.00    Pack years: 24.50    Types:  Cigarettes  . Smokeless tobacco: Never Used  Substance Use Topics  . Alcohol use: Yes    Comment: occasional wine    FAMILY HISTORY:   Family History  Problem Relation Age of Onset  . Breast cancer Mother 52  . Lung cancer Father   . Cancer Brother   . Cancer Other   . Stroke Other     DRUG ALLERGIES:   Allergies  Allergen Reactions  . Penicillin G Hives    Other reaction(s): HIVES Other reaction(s): HIVES  . Penicillin V Itching  . Penicillins     REVIEW OF SYSTEMS:   ROS As per history of present illness. All pertinent systems were reviewed above. Constitutional, HEENT, cardiovascular, respiratory, GI, GU, musculoskeletal, neuro, psychiatric, endocrine, integumentary and hematologic systems were reviewed and are otherwise negative/unremarkable except for positive findings mentioned above in the HPI.   MEDICATIONS AT HOME:   Prior to Admission medications   Medication Sig Start Date End Date Taking? Authorizing Provider  albuterol (VENTOLIN HFA) 108 (90 Base) MCG/ACT inhaler Inhale 2 puffs into the lungs every 4 (four) hours as needed for wheezing or shortness of breath. 02/21/21  Yes Danford, Suann Larry, MD  busPIRone (BUSPAR) 10 MG tablet Take 1 tablet (10 mg total) by mouth 2 (two) times daily. 03/07/21  Yes Earlie Server, MD  budesonide-formoterol [facility]) 80-4.5 MCG/ACT inhaler Inhale 2 puffs into the lungs 2 (two) times daily.    [provider]  clonazePAM (KLONOPIN) 0.5 MG tablet Take 1 tablet (0.5 mg total) by mouth 2 (two) times daily as needed (anxiety). 03/17/21   Domenic Polite, MD  dexamethasone (DECADRON) 4 MG tablet Take 1 tablet (4 mg total) by mouth 2 (two) times daily. 03/02/21   Oswald Hillock, MD  fentaNYL (DURAGESIC) 25 MCG/HR Place 1 patch onto the skin every 3 (three) days. 03/20/21   Domenic Polite, MD  folic acid (FOLVITE) 1 MG tablet Take 1 tablet (1 mg total) by mouth daily. Start 5-7 days before Alimta chemotherapy. Continue until 21  days after Alimta completed. 03/04/21   Earlie Server, MD  furosemide (LASIX) 20 MG tablet Take 1 tablet (20 mg total) by mouth daily. 03/17/21   Domenic Polite, MD  gabapentin (NEURONTIN) 600 MG tablet Take 0.5 tablets (300 mg total) by mouth at bedtime. 03/17/21   Domenic Polite, MD  guaiFENesin (MUCINEX) 600 MG 12 hr tablet Take 2 tablets (1,200 mg total) by mouth 2 (two) times daily. 03/02/21   Oswald Hillock, MD  ipratropium-albuterol (DUONEB) 0.5-2.5 (3) MG/3ML SOLN Take 3 mLs by nebulization every 6 (six) hours as needed. 03/08/21   Borders, Kirt Boys, NP  lidocaine-prilocaine (EMLA) cream Apply to affected area once Patient taking differently: Apply 1 application topically daily as needed. Apply to affected area once 03/04/21   Earlie Server, MD  magic mouthwash SOLN Take 5 mLs by mouth 3 (three) times daily as needed for mouth pain. 03/09/21   Earlie Server, MD  metoprolol tartrate (LOPRESSOR) 25 MG tablet Take 0.5 tablets (12.5 mg total) by mouth 2 (two) times daily. 03/02/21 06/30/21  Oswald Hillock, MD  oxyCODONE-acetaminophen (PERCOCET/ROXICET) 5-325 MG tablet Take 1-2 tablets by mouth every 6 (six) hours as needed for severe pain. 03/17/21   Domenic Polite, MD  pantoprazole (PROTONIX) 40 MG tablet Take 40 mg by mouth daily. 02/08/21   [provider]  potassium chloride SA (KLOR-CON) 20 MEQ tablet Take 1 tablet (20 mEq total) by mouth daily. 03/17/21   Domenic Polite, MD  promethazine (PHENERGAN) 12.5 MG tablet Take 1 tablet (12.5 mg total) by mouth every 6 (six) hours as needed for nausea or vomiting. 03/07/21   Earlie Server, MD  QUEtiapine (SEROQUEL) 400 MG tablet Take 0.5 tablets (200 mg total) by mouth at bedtime. 03/17/21   Domenic Polite, MD  amitriptyline (ELAVIL) 25 MG tablet Take 2 tablets (50 mg total) by mouth at bedtime. Patient not taking: No sig reported 08/26/14 01/01/21  Kathrynn Ducking, MD  temazepam (RESTORIL) 30 MG capsule Take 30 mg by mouth at bedtime.  01/01/21  [provider]       VITAL SIGNS:  Blood pressure (!) 161/98, pulse (!) 121, temperature 98.4 F (36.9 C), temperature source Oral, resp. rate 12, height 5\' 6"  (1.676 m), weight 50.7 kg, SpO2 96 %.  PHYSICAL EXAMINATION:  Physical Exam  GENERAL:  60 y.o.-year-old female patient lying in the bed with moderate distress due to back pain. EYES: Pupils equal, round, reactive to light and accommodation. No scleral icterus. Extraocular muscles intact.  HEENT: Head atraumatic, normocephalic. Oropharynx and nasopharynx clear.  NECK:  Supple, no jugular venous distention. No thyroid enlargement, no tenderness.  LUNGS: Slight diminished bibasilar breath sounds with bibasal crackles. CARDIOVASCULAR: Regular rate and rhythm, S1, S2 normal. No murmurs, rubs, or gallops.  ABDOMEN: Soft, nondistended, with generalized tenderness without rebound tenderness guarding or rigidity.. Bowel sounds present. No organomegaly or mass.  EXTREMITIES: Trace bilateral lower extremity pitting edema with no cyanosis, or clubbing.  NEUROLOGIC: Cranial nerves II through XII are intact. Muscle strength 5/5 in all extremities. Sensation intact. Gait not checked.  PSYCHIATRIC: The patient is alert and cooperative.  Flat affect and good eye contact. SKIN: No obvious rash, lesion, or ulcer.   LABORATORY PANEL:   CBC Recent Labs  Lab 03/17/21 2051  WBC 16.2*  HGB 8.8*  HCT 26.6*  PLT 63*   ------------------------------------------------------------------------------------------------------------------  Chemistries  Recent Labs  Lab 03/14/21 0817 03/15/21 1109 03/17/21 2051  NA 139   < > 135  K 2.6*   < > 4.0  CL 100   < > 103  CO2 26   < > 22  GLUCOSE 127*   < > 82  BUN 30*   < > 21*  CREATININE 1.02*   < > 0.70  CALCIUM 8.4*   < > 7.9*  MG 1.7  --   --   AST 30   < > 89*  ALT 30   < > 52*  ALKPHOS 331*   < > 295*  BILITOT 0.5   < > 1.0   < > = values in this interval not displayed.    ------------------------------------------------------------------------------------------------------------------  Cardiac Enzymes No results for input(s): TROPONINI in the last 168 hours. ------------------------------------------------------------------------------------------------------------------  RADIOLOGY:  CT Angio Chest PE W/Cm &/Or Wo Cm  Addendum Date: 03/17/2021   ADDENDUM REPORT: 03/17/2021 23:51 ADDENDUM: Critical Value/emergent results were called by telephone at the time of interpretation on 03/17/2021  at 11:46 pm to provider [facility] , who verbally acknowledged these results. Electronically Signed   By: Ulyses Jarred M.D.   On: 03/17/2021 23:51   Result Date: 03/17/2021 CLINICAL DATA:  Stage IV metastatic lung carcinoma. Discharged from ICU today. Lower abdominal pain after enema. EXAM: CT ANGIOGRAPHY CHEST CT ABDOMEN AND PELVIS WITH CONTRAST TECHNIQUE: Multidetector CT imaging of the chest was performed using the standard protocol during bolus administration of intravenous contrast. Multiplanar CT image reconstructions and MIPs were obtained to evaluate the vascular anatomy. Multidetector CT imaging of the abdomen and pelvis was performed using the standard protocol during bolus administration of intravenous contrast. CONTRAST:  12mL OMNIPAQUE IOHEXOL 350 MG/ML SOLN COMPARISON:  None. FINDINGS: CTA CHEST FINDINGS Cardiovascular: Contrast injection is sufficient to demonstrate satisfactory opacification of the pulmonary arteries to the segmental level.There are filling defects within the proximal right middle lobar artery and within the posterior segmental branches of the right lower lobe. On the left, there are filling defects within the left lower lobe segmental branches. The main pulmonary artery is within normal limits for size. There is no CT evidence of acute right heart strain. Mild calcific aortic atherosclerosis. Aorta is otherwise normal. There is a normal 3-vessel  arch branching pattern. Heart size is normal, without pericardial effusion. Mediastinum/Nodes: Unchanged size of right hilar mass measuring 4.3 x 3.1 cm. Left hilar nodes are unchanged. Confluent pretracheal adenopathy also unchanged. Lungs/Pleura: Widespread ground-glass opacities are new since the prior study. Right middle lobe nodule (image 48) has decreased in size measuring 2.0 x 1.7 cm, previously 2.6 x 2.7 cm. There are small bilateral pleural effusions. 4 mm nodule in the left upper lobe is unchanged (image 42). Musculoskeletal: Sclerotic focus in the right aspect of the T5 vertebral body has markedly increased in size. Lytic lesion at T9 is unchanged. T7 lucent lesion is also unchanged. Review of the MIP images confirms the above findings. CT ABDOMEN and PELVIS FINDINGS Hepatobiliary: Unchanged hypodensity in the left hepatic lobe (2:12 measuring 12 mm. There is faint peripheral enhancement. There is also a small area of hyperenhancement more laterally at the same level (see aero). This is at the site of the hypodense focus seen on the previous CT. Subcapsular hypodensity with peripheral enhancement is unchanged (2:20). Status post cholecystectomy. Pancreas: Normal contours without ductal dilatation. No peripancreatic fluid collection. Spleen: Normal. Adrenals/Urinary Tract: --Adrenal glands: Normal. 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 --Left kidney/ureter: Proximal left ureter is dilated. There is focal hypoattenuation at the lower pole of the left kidney with suspected tumor thrombus in the lower branches of the left renal artery. --Urinary bladder: Dilated Stomach/Bowel: --Stomach/Duodenum: No hiatal hernia or other gastric abnormality. Normal duodenal course and caliber. --Small bowel: No dilatation or inflammation. --Colon: No focal abnormality. --Appendix: Normal. 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 There is calcific atherosclerosis of the aorta. The portal vein, splenic vein and superior mesenteric vein are patent. The IVC is patent. There is a 12 mm retrocaval lymph node, unchanged. Multiple subcentimeter retroperitoneal nodes. Reproductive: There is a small amount of free fluid in the pelvis. Normal uterus. No adnexal mass. Musculoskeletal. Mixed density appearance of L2 is unchanged. Small sclerotic focus within the S1 body has slightly increased in size. Other: None. IMPRESSION: 1. Acute pulmonary emboli within multiple bilateral lobar and segmental branches. No CT evidence of acute right heart strain. 2. Widespread ground-glass opacities, new  since the prior study. This could indicate infection or post treatment change. Lymphangitic tumor spread is also possible. New small bilateral pleural effusions. 3. Unchanged size of right hilar mass and mediastinal lymphadenopathy. Right middle lobe dominant nodule has decreased in size. 4. 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 Unchanged appearance of liver metastases allowing for differences in contrast timing. 6. Multiple osseous metastases, worsened clearly at T5. Aortic Atherosclerosis (ICD10-I70.0). Electronically Signed: By: Ulyses Jarred M.D. On: 03/17/2021 23:39   CT Abdomen Pelvis W Contrast  Addendum Date: 03/17/2021   ADDENDUM REPORT: 03/17/2021 23:51 ADDENDUM: Critical Value/emergent results were called by telephone at the time of interpretation on 03/17/2021 at 11:46 pm to provider [facility] , who verbally acknowledged these results. Electronically Signed   By: Ulyses Jarred M.D.   On: 03/17/2021 23:51   Result Date: 03/17/2021 CLINICAL DATA:  Stage IV metastatic lung carcinoma. Discharged from ICU today. Lower abdominal pain after enema. EXAM: CT ANGIOGRAPHY CHEST CT ABDOMEN AND PELVIS WITH CONTRAST TECHNIQUE: Multidetector CT imaging  of the chest was performed using the standard protocol during bolus administration of intravenous contrast. Multiplanar CT image reconstructions and MIPs were obtained to evaluate the vascular anatomy. Multidetector CT imaging of the abdomen and pelvis was performed using the standard protocol during bolus administration of intravenous contrast. CONTRAST:  22mL OMNIPAQUE IOHEXOL 350 MG/ML SOLN COMPARISON:  None. FINDINGS: CTA CHEST FINDINGS Cardiovascular: Contrast injection is sufficient to demonstrate satisfactory opacification of the pulmonary arteries to the segmental level.There are filling defects within the proximal right middle lobar artery and within the posterior segmental branches of the right lower lobe. On the left, there are filling defects within the left lower lobe segmental branches. The main pulmonary artery is within normal limits for size. There is no CT evidence of acute right heart strain. Mild calcific aortic atherosclerosis. Aorta is otherwise normal. There is a normal 3-vessel arch branching pattern. Heart size is normal, without pericardial effusion. Mediastinum/Nodes: Unchanged size of right hilar mass measuring 4.3 x 3.1 cm. Left hilar nodes are unchanged. Confluent pretracheal adenopathy also unchanged. Lungs/Pleura: Widespread ground-glass opacities are new since the prior study. Right middle lobe nodule (image 48) has decreased in size measuring 2.0 x 1.7 cm, previously 2.6 x 2.7 cm. There are small bilateral pleural effusions. 4 mm nodule in the left upper lobe is unchanged (image 42). Musculoskeletal: Sclerotic focus in the right aspect of the T5 vertebral body has markedly increased in size. Lytic lesion at T9 is unchanged. T7 lucent lesion is also unchanged. Review of the MIP images confirms the above findings. CT ABDOMEN and PELVIS FINDINGS Hepatobiliary: Unchanged hypodensity in the left hepatic lobe (2:12 measuring 12 mm. There is faint peripheral enhancement. There is also a  small area of hyperenhancement more laterally at the same level (see aero). This is at the site of the hypodense focus seen on the previous CT. Subcapsular hypodensity with peripheral enhancement is unchanged (2:20). Status post cholecystectomy. Pancreas: Normal contours without ductal dilatation. No peripancreatic fluid collection. Spleen: Normal. Adrenals/Urinary Tract: --Adrenal glands: Normal. 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 --Left kidney/ureter: Proximal left ureter is dilated. There is focal hypoattenuation at the lower pole of the left kidney with suspected tumor thrombus in the lower branches of the left renal artery. --Urinary bladder: Dilated Stomach/Bowel: --Stomach/Duodenum: No hiatal hernia or other gastric abnormality. Normal duodenal course and caliber. --  Small bowel: No dilatation or inflammation. --Colon: No focal abnormality. --Appendix: Normal. 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 There is calcific atherosclerosis of the aorta. The portal vein, splenic vein and superior mesenteric vein are patent. The IVC is patent. There is a 12 mm retrocaval lymph node, unchanged. Multiple subcentimeter retroperitoneal nodes. Reproductive: There is a small amount of free fluid in the pelvis. Normal uterus. No adnexal mass. Musculoskeletal. Mixed density appearance of L2 is unchanged. Small sclerotic focus within the S1 body has slightly increased in size. Other: None. IMPRESSION: 1. Acute pulmonary emboli within multiple bilateral lobar and segmental branches. No CT evidence of acute right heart strain. 2. Widespread ground-glass opacities, new since the prior study. This could indicate infection or post treatment change. Lymphangitic tumor spread is also possible. New small bilateral pleural effusions. 3. Unchanged size of right hilar mass and mediastinal  lymphadenopathy. Right middle lobe dominant nodule has decreased in size. 4. 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 Unchanged appearance of liver metastases allowing for differences in contrast timing. 6. Multiple osseous metastases, worsened clearly at T5. Aortic Atherosclerosis (ICD10-I70.0). Electronically Signed: By: Ulyses Jarred M.D. On: 03/17/2021 23:39   DG Chest Portable 1 View  Result Date: 03/17/2021 CLINICAL DATA:  Shortness of breath EXAM: PORTABLE CHEST 1 VIEW COMPARISON:  03/15/2021 FINDINGS: Worsening bilateral opacities throughout both lungs. No pneumothorax or sizable pleural effusion. Normal cardiomediastinal contours. IMPRESSION: Worsening bilateral airspace opacities. Electronically Signed   By: Ulyses Jarred M.D.   On: 03/17/2021 21:23      IMPRESSION AND PLAN:  Active Problems:   Acute pulmonary embolism ([place])  1.  Acute bilateral pulmonary embolism in the setting of metastatic lung cancer on palliative chemotherapy.  The patient has bilateral multifocal pneumonia and acute hypoxic respiratory failure secondary to her PE and pneumonia - The patient will be admitted to progressive unit bed. - We will continue her on IV heparin. - O2 protocol will be followed. - Oncology follow-up consult to be obtained. - We will continue her Decadron. - We will continue antibiotic therapy for worsening pneumonia with IV Rocephin and Zithromax. - Mucolytic therapy will be provided as well as bronchodilator therapy.  2.  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 - We will continue the patient on IV heparin as mentioned above. - Vascular surgery consult to be obtained.  3.Low back pain likely secondary to multiple osseous metastasis from her lung cancer. - Pain management will be provided.  4.  Elevated lipase level. - This could be secondary to her renal artery thrombosis and renal  infarctions.  She could be having associated pancreatitis. - We will keep her n.p.o. for now. - Pain management will be provided. -We will follow lipase levels while hydrating her.  5.  Anxiety and depression. - BuSpar and Klonopin as well as Seroquel will be continued.  6.  Peripheral neuropathy. - We will continue Neurontin.  7.  Essential hypertension. - We will continue Lopressor   DVT prophylaxis: IV heparin . Code Status: The patient is DNR/DNI. Family Communication:  The plan of care was discussed in details with the patient (and her sister).  The patient's sister is aware of the patient's grim prognosis.  She is going to have palliative care consult I answered all questions. The patient agreed to proceed with the above mentioned plan. Further management will depend upon hospital course. Disposition Plan: Back to previous home  environment Consults called: Oncology and vascular surgery as well as palliative care consult All the records are reviewed and case discussed with ED provider.  Status is: Inpatient  Remains inpatient appropriate because:Ongoing active pain requiring inpatient pain management, Altered mental status, Ongoing diagnostic testing needed not appropriate for outpatient work up, Unsafe d/c plan, IV treatments appropriate due to intensity of illness or inability to take PO and Inpatient level of care appropriate due to severity of illness   Dispo: The patient is from: Home              Anticipated d/c is to: Home              Patient currently is not medically stable to d/c.   Difficult to place patient No    TOTAL TIME TAKING CARE OF THIS PATIENT: 55 minutes.    Christel Mormon M.D on 03/18/2021 at 2:41 AM  [facility]   From 7 PM-7 AM, contact night-coverage [email or web address]  CC: Primary care physician; Remi Haggard, FNP

## 2021-03-18 NOTE — ED Notes (Signed)
Pt placed in hospital bed for greater comfort. New linens, chux, purewick, and brief applied. Pericare completed.

## 2021-03-18 NOTE — Consult Note (Addendum)
[place]  [phone] [phone]   Name: Brenda Middleton Date: 03/18/2021 MRN: [id number]  DOB: [birth year]  Patient Care Team: Remi Haggard, FNP as PCP - General (Family Medicine) Remi Haggard, FNP (Family Medicine) Christene Lye, MD (General Surgery) Telford Nab, RN as Oncology Nurse Navigator    REASON FOR CONSULTATION: Brenda Middleton is a 60 y.o. female with multiple medical problems including anxiety, nicotine dependence, hypertension, depression, and chronic low back pain.  Patient was recently diagnosed with stage IV non-small cell lung cancer.  MRI of the brain revealed destructive mass in the occipital bone bilaterally extending into the posterior fossa with mass-effect to the left cerebellum.  CT of the chest abdomen and pelvis revealed a large mass in the right middle lobe with widespread metastases involving lymphadenopathy in the chest, multiple pulmonary nodules, bone lesions, and liver lesions.  She was found to have a pathologic fracture of L2.  Patient was recently hospitalized 02/18/2021 -02/21/2021 with intractable low back pain.  Pain improved with dose escalation of opioids.  She was readmitted 02/27/2021 -03/08/2021 with HCAP.  Patient was again readmitted 03/15/2021 -03/17/2021 with AMS presumably due to overuse of pain medications and possible recurrent PNA. Patient is now readmitted on 03/18/2021 with hypoxic respiratory failure.  CT revealed bilateral PEs and multifocal pneumonia.  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  Palliative care was consulted help address goals and manage ongoing symptoms.  SOCIAL HISTORY:     reports that she has quit smoking. Her smoking use included cigarettes. She has a 24.50 pack-year smoking history. She has never used smokeless tobacco. She reports current alcohol use. She reports that she does not  use drugs.  Patient is unmarried.  She has no children.  She lives at home with her sister.  She has another sister in [place].  Patient is disabled.  She has a history of substance use, primarily crack cocaine but reports sobriety over the past 6 years.  ADVANCE DIRECTIVES:  None on file  CODE STATUS: DNR  PAST MEDICAL HISTORY: Past Medical History:  Diagnosis Date  . Anxiety   . Benign neoplasm of cervix uteri   . Chronic hepatitis C without mention of hepatic coma   . Chronic low back pain 08/26/2014  . Constipation   . Depressive disorder   . Displacement of cervical intervertebral disc   . Hypertensive disorder   . Iron deficiency anemia due to chronic blood loss 09/12/2019  . Palpitations   . Prolapsed cervical intervertebral disc     PAST SURGICAL HISTORY:  Past Surgical History:  Procedure Laterality Date  . CONIZATION CERVIX    . DILATION AND CURETTAGE OF UTERUS    . HAND SURGERY  2008,2015   Carpel Tunnel  . TONSILLECTOMY    . VULVECTOMY      HEMATOLOGY/ONCOLOGY HISTORY:  Oncology History  Malignant neoplasm of lung ([place])  02/27/2021 Initial Diagnosis   Primary malignant neoplasm of lung metastatic to other site [facility])   03/03/2021 Cancer Staging   Staging form: Lung, AJCC 8th Edition - Clinical: Stage IVB (cT4, cN3, cM1c) - Signed by Earlie Server, MD on 03/03/2021   03/14/2021 -  Chemotherapy    Patient is on Treatment Plan: LUNG NSCLC PEMETREXED (ALIMTA) / CARBOPLATIN Q21D X 1 CYCLES        ALLERGIES:  is allergic to penicillin g, penicillin v, and penicillins.  MEDICATIONS:  Current Facility-Administered Medications  Medication Dose Route Frequency Provider Last Rate Last Admin  . 0.9 %  sodium chloride infusion   Intravenous Continuous Mansy, Arvella Merles, MD 100 mL/hr at 03/18/21 0213 New Bag at 03/18/21 7628  . acetaminophen (TYLENOL) tablet 650 mg  650 mg Oral Q6H PRN Mansy, Jan A, MD       Or  . acetaminophen (TYLENOL) suppository 650 mg  650 mg Rectal  Q6H PRN Mansy, Jan A, MD      . albuterol (VENTOLIN HFA) 108 (90 Base) MCG/ACT inhaler 2 puff  2 puff Inhalation Q4H PRN Mansy, Jan A, MD      . azithromycin (ZITHROMAX) 500 mg in sodium chloride 0.9 % 250 mL IVPB  500 mg Intravenous Q24H Mansy, Jan A, MD      . busPIRone (BUSPAR) tablet 10 mg  10 mg Oral BID Mansy, Jan A, MD      . cefTRIAXone (ROCEPHIN) 2 g in sodium chloride 0.9 % 100 mL IVPB  2 g Intravenous Q24H Mansy, Arvella Merles, MD   Stopped at 03/18/21 [phone]  . dexamethasone (DECADRON) tablet 4 mg  4 mg Oral BID Mansy, Jan A, MD      . dextromethorphan-guaiFENesin [facility] DM) 30-600 MG per 12 hr tablet 1 tablet  1 tablet Oral BID PRN Mansy, Jan A, MD      . fentaNYL (DURAGESIC) 25 MCG/HR 1 patch  1 patch Transdermal Q72H Mansy, Jan A, MD   1 patch at 03/18/21 0753  . folic acid (FOLVITE) tablet 1 mg  1 mg Oral Daily Mansy, Jan A, MD      . furosemide (LASIX) tablet 20 mg  20 mg Oral Daily Mansy, Jan A, MD      . gabapentin (NEURONTIN) tablet 300 mg  300 mg Oral QHS Mansy, Jan A, MD      . guaiFENesin (MUCINEX) 12 hr tablet 1,200 mg  1,200 mg Oral BID Mansy, Jan A, MD      . heparin ADULT infusion 100 units/mL (25000 units/264m)  800 Units/hr Intravenous Continuous POswald Hillock RPH 8 mL/hr at 03/18/21 0109 800 Units/hr at 03/18/21 0109  . HYDROmorphone (DILAUDID) injection 0.5-1 mg  0.5-1 mg Intravenous Q2H PRN Mansy, Jan A, MD   1 mg at 03/18/21 1048  . ipratropium-albuterol (DUONEB) 0.5-2.5 (3) MG/3ML nebulizer solution 3 mL  3 mL Nebulization Q4H PRN Mansy, Jan A, MD      . ipratropium-albuterol (DUONEB) 0.5-2.5 (3) MG/3ML nebulizer solution 3 mL  3 mL Nebulization QID Mansy, Jan A, MD      . labetalol (NORMODYNE) injection 20 mg  20 mg Intravenous Q3H PRN Mansy, Jan A, MD      . lidocaine-prilocaine (EMLA) cream 1 application  1 application Topical Daily PRN Mansy, Jan A, MD      . LORazepam (ATIVAN) injection 0.5 mg  0.5 mg Intravenous Q4H PRN KBonnielee Haff MD      . magic mouthwash   5 mL Oral TID PRN Mansy, Jan A, MD      . magnesium hydroxide (MILK OF MAGNESIA) suspension 30 mL  30 mL Oral Daily PRN Mansy, Jan A, MD      . metoprolol tartrate (LOPRESSOR) tablet 50 mg  50 mg Oral BID KBonnielee Haff MD      . morphine 2 MG/ML injection 2 mg  2 mg Intravenous Q1H PRN Mansy, Jan A, MD      . ondansetron (ZOFRAN) tablet 4 mg  4  mg Oral Q6H PRN Mansy, Jan A, MD       Or  . ondansetron [facility]) injection 4 mg  4 mg Intravenous Q6H PRN Mansy, Jan A, MD   4 mg at 03/18/21 2876  . oxyCODONE (Oxy IR/ROXICODONE) immediate release tablet 5 mg  5 mg Oral Q4H PRN Mansy, Jan A, MD      . oxyCODONE-acetaminophen (PERCOCET/ROXICET) 5-325 MG per tablet 1-2 tablet  1-2 tablet Oral Q6H PRN Mansy, Jan A, MD      . pantoprazole (PROTONIX) EC tablet 40 mg  40 mg Oral Daily Mansy, Jan A, MD      . potassium chloride SA (KLOR-CON) CR tablet 20 mEq  20 mEq Oral Daily Mansy, Jan A, MD      . promethazine (PHENERGAN) tablet 12.5 mg  12.5 mg Oral Q6H PRN Mansy, Jan A, MD      . Derrill Memo ON 03/19/2021] QUEtiapine (SEROQUEL) tablet 200 mg  200 mg Oral QHS Mansy, Jan A, MD      . traZODone (DESYREL) tablet 25 mg  25 mg Oral QHS PRN Mansy, Arvella Merles, MD       Current Outpatient Medications  Medication Sig Dispense Refill  . albuterol (VENTOLIN HFA) 108 (90 Base) MCG/ACT inhaler Inhale 2 puffs into the lungs every 4 (four) hours as needed for wheezing or shortness of breath. 6.7 g 1  . budesonide-formoterol (SYMBICORT) 80-4.5 MCG/ACT inhaler Inhale 2 puffs into the lungs 2 (two) times daily.    . busPIRone (BUSPAR) 10 MG tablet Take 1 tablet (10 mg total) by mouth 2 (two) times daily. 60 tablet 2  . dexamethasone (DECADRON) 4 MG tablet Take 1 tablet (4 mg total) by mouth 2 (two) times daily. 60 tablet 1  . folic acid (FOLVITE) 1 MG tablet Take 1 tablet (1 mg total) by mouth daily. Start 5-7 days before Alimta chemotherapy. Continue until 21 days after Alimta completed. 100 tablet 3  . furosemide (LASIX) 20 MG  tablet Take 1 tablet (20 mg total) by mouth daily.    Marland Kitchen gabapentin (NEURONTIN) 600 MG tablet Take 0.5 tablets (300 mg total) by mouth at bedtime.    Marland Kitchen guaiFENesin (MUCINEX) 600 MG 12 hr tablet Take 2 tablets (1,200 mg total) by mouth 2 (two) times daily. 30 tablet 0  . ipratropium-albuterol (DUONEB) 0.5-2.5 (3) MG/3ML SOLN Take 3 mLs by nebulization every 6 (six) hours as needed. (Patient taking differently: Take 3 mLs by nebulization every 6 (six) hours as needed (wheezing or shortness of breath).) 360 mL 3  . lidocaine-prilocaine (EMLA) cream Apply to affected area once (Patient taking differently: Apply 1 application topically daily as needed. Apply to affected area once) 30 g 3  . magic mouthwash SOLN Take 5 mLs by mouth 3 (three) times daily as needed for mouth pain. 300 mL 1  . metoprolol tartrate (LOPRESSOR) 25 MG tablet Take 0.5 tablets (12.5 mg total) by mouth 2 (two) times daily. 30 tablet 3  . pantoprazole (PROTONIX) 40 MG tablet Take 40 mg by mouth daily.    . promethazine (PHENERGAN) 12.5 MG tablet Take 1 tablet (12.5 mg total) by mouth every 6 (six) hours as needed for nausea or vomiting. 60 tablet 2  . QUEtiapine (SEROQUEL) 400 MG tablet Take 0.5 tablets (200 mg total) by mouth at bedtime.    . clonazePAM (KLONOPIN) 0.5 MG tablet Take 1 tablet (0.5 mg total) by mouth 2 (two) times daily as needed (anxiety). 20 tablet 0  . [START ON  03/20/2021] fentaNYL (DURAGESIC) 25 MCG/HR Place 1 patch onto the skin every 3 (three) days. 5 patch 0  . oxyCODONE-acetaminophen (PERCOCET/ROXICET) 5-325 MG tablet Take 1-2 tablets by mouth every 6 (six) hours as needed for severe pain.  0  . potassium chloride SA (KLOR-CON) 20 MEQ tablet Take 1 tablet (20 mEq total) by mouth daily.  0    VITAL SIGNS: BP (!) 153/97   Pulse (!) 116   Temp 98.4 F (36.9 C) (Oral)   Resp 16   Ht _0  (1.676 m)   Wt 111 lb 12.4 oz (50.7 kg)   SpO2 98%   BMI 18.04 kg/m  Filed Weights   03/17/21 2045  Weight: 111 lb  12.4 oz (50.7 kg)    Estimated body mass index is 18.04 kg/m as calculated from the following:   Height as of this encounter: _1  (1.676 m).   Weight as of this encounter: 111 lb 12.4 oz (50.7 kg).  LABS: CBC:    Component Value Date/Time   WBC 12.7 (H) 03/18/2021 0447   HGB 8.1 (L) 03/18/2021 0447   HGB 13.6 11/04/2013 1650   HCT 25.2 (L) 03/18/2021 0447   HCT 40.2 11/04/2013 1650   PLT 49 (L) 03/18/2021 0447   PLT 136 (L) 11/04/2013 1650   MCV 84.3 03/18/2021 0447   MCV 90 11/04/2013 1650   NEUTROABS 14.6 (H) 03/17/2021 2051   NEUTROABS 3.5 10/12/2014 1019   LYMPHSABS 1.0 03/17/2021 2051   LYMPHSABS 3.2 (H) 10/12/2014 1019   MONOABS 0.0 (L) 03/17/2021 2051   EOSABS 0.4 03/17/2021 2051   EOSABS 0.4 10/12/2014 1019   BASOSABS 0.0 03/17/2021 2051   BASOSABS 0.0 10/12/2014 1019   Comprehensive Metabolic Panel:    Component Value Date/Time   NA 136 03/18/2021 0447   NA 142 11/05/2013 0408   K 3.5 03/18/2021 0447   K 3.5 11/05/2013 0408   CL 105 03/18/2021 0447   CL 113 (H) 11/05/2013 0408   CO2 22 03/18/2021 0447   CO2 22 11/05/2013 0408   BUN 17 03/18/2021 0447   BUN 10 11/05/2013 0408   CREATININE 0.54 03/18/2021 0447   CREATININE 0.61 11/05/2013 0408   GLUCOSE 80 03/18/2021 0447   GLUCOSE 84 11/05/2013 0408   CALCIUM 7.5 (L) 03/18/2021 0447   CALCIUM 8.3 (L) 11/05/2013 0408   AST 89 (H) 03/17/2021 2051   AST 34 11/04/2013 1650   ALT 52 (H) 03/17/2021 2051   ALT 43 11/04/2013 1650   ALKPHOS 295 (H) 03/17/2021 2051   ALKPHOS 132 (H) 11/04/2013 1650   BILITOT 1.0 03/17/2021 2051   BILITOT 0.2 11/04/2013 1650   PROT 6.0 (L) 03/17/2021 2051   PROT 6.6 10/12/2014 1019   PROT 7.3 11/04/2013 1650   ALBUMIN 2.3 (L) 03/17/2021 2051   ALBUMIN 4.1 10/12/2014 1019   ALBUMIN 3.4 11/04/2013 1650    RADIOGRAPHIC STUDIES: DG Chest 2 View  Result Date: 03/05/2021 CLINICAL DATA:  Shortness of breath EXAM: CHEST - 2 VIEW COMPARISON:  02/27/2021 FINDINGS: Heart is  normal size. Right upper lobe mass again noted as seen on CT. Patchy bilateral airspace disease again noted, similar to prior study. No effusions or acute bony abnormality. IMPRESSION: Patchy bilateral airspace disease again noted, not significantly changed. Right upper lobe mass again seen. Electronically Signed   By: Rolm Baptise M.D.   On: 03/05/2021 21:42   CT CHEST W CONTRAST  Result Date: 03/06/2021 CLINICAL DATA:  Respiratory failure, shortness of breath EXAM: CT  CHEST WITH CONTRAST TECHNIQUE: Multidetector CT imaging of the chest was performed during intravenous contrast administration. CONTRAST:  73m OMNIPAQUE IOHEXOL 300 MG/ML  SOLN COMPARISON:  02/27/2021.  Chest x-ray 03/05/2021 FINDINGS: Cardiovascular: Heart is normal size. Coronary artery and aortic calcifications. No aneurysm. Mediastinum/Nodes: Bulky mediastinal and bilateral hilar adenopathy again noted, unchanged. Trachea is patent. Thyroid unremarkable. Lungs/Pleura: Ground-glass airspace opacities in the lower lobes have worsened since prior study, particularly in the left lower lobe. Ground-glass opacities in the upper lobes somewhat improved since prior study. Numerous bilateral pulmonary nodules are stable. Right upper lobe mass again noted and stable. No effusions. Upper Abdomen: Imaging into the upper abdomen demonstrates no acute findings. Musculoskeletal: Chest wall soft tissues are unremarkable. Multiple osseous metastases again noted, unchanged. IMPRESSION: Redemonstrated is extensive metastatic disease in the chest with numerous pulmonary nodules, dominant right upper lobe mass, and bulky mediastinal/bilateral hilar adenopathy. Ground-glass opacities have increased in the lower lobes, left greater than right, likely infectious/inflammatory. Somewhat improvement in ground-glass opacities in the upper lobe since prior study. Stable osseous metastatic disease. Coronary artery disease. Aortic Atherosclerosis (ICD10-I70.0).  Electronically Signed   By: KRolm BaptiseM.D.   On: 03/06/2021 00:11   CT Angio Chest PE W/Cm &/Or Wo Cm  Addendum Date: 03/17/2021   ADDENDUM REPORT: 03/17/2021 23:51 ADDENDUM: Critical Value/emergent results were called by telephone at the time of interpretation on 03/17/2021 at 11:46 pm to provider [facility], who verbally acknowledged these results. Electronically Signed   By: KUlyses JarredM.D.   On: 03/17/2021 23:51   Result Date: 03/17/2021 CLINICAL DATA:  Stage IV metastatic lung carcinoma. Discharged from ICU today. Lower abdominal pain after enema. EXAM: CT ANGIOGRAPHY CHEST CT ABDOMEN AND PELVIS WITH CONTRAST TECHNIQUE: Multidetector CT imaging of the chest was performed using the standard protocol during bolus administration of intravenous contrast. Multiplanar CT image reconstructions and MIPs were obtained to evaluate the vascular anatomy. Multidetector CT imaging of the abdomen and pelvis was performed using the standard protocol during bolus administration of intravenous contrast. CONTRAST:  73mOMNIPAQUE IOHEXOL 350 MG/ML SOLN COMPARISON:  None. FINDINGS: CTA CHEST FINDINGS Cardiovascular: Contrast injection is sufficient to demonstrate satisfactory opacification of the pulmonary arteries to the segmental level.There are filling defects within the proximal right middle lobar artery and within the posterior segmental branches of the right lower lobe. On the left, there are filling defects within the left lower lobe segmental branches. The main pulmonary artery is within normal limits for size. There is no CT evidence of acute right heart strain. Mild calcific aortic atherosclerosis. Aorta is otherwise normal. There is a normal 3-vessel arch branching pattern. Heart size is normal, without pericardial effusion. Mediastinum/Nodes: Unchanged size of right hilar mass measuring 4.3 x 3.1 cm. Left hilar nodes are unchanged. Confluent pretracheal adenopathy also unchanged. Lungs/Pleura:  Widespread ground-glass opacities are new since the prior study. Right middle lobe nodule (image 48) has decreased in size measuring 2.0 x 1.7 cm, previously 2.6 x 2.7 cm. There are small bilateral pleural effusions. 4 mm nodule in the left upper lobe is unchanged (image 42). Musculoskeletal: Sclerotic focus in the right aspect of the T5 vertebral body has markedly increased in size. Lytic lesion at T9 is unchanged. T7 lucent lesion is also unchanged. Review of the MIP images confirms the above findings. CT ABDOMEN and PELVIS FINDINGS Hepatobiliary: Unchanged hypodensity in the left hepatic lobe (2:12 measuring 12 mm. There is faint peripheral enhancement. There is also a small area of hyperenhancement more  laterally at the same level (see aero). This is at the site of the hypodense focus seen on the previous CT. Subcapsular hypodensity with peripheral enhancement is unchanged (2:20). Status post cholecystectomy. Pancreas: Normal contours without ductal dilatation. No peripancreatic fluid collection. Spleen: Normal. Adrenals/Urinary Tract: --Adrenal glands: Normal. 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 --Left kidney/ureter: Proximal left ureter is dilated. There is focal hypoattenuation at the lower pole of the left kidney with suspected tumor thrombus in the lower branches of the left renal artery. --Urinary bladder: Dilated Stomach/Bowel: --Stomach/Duodenum: No hiatal hernia or other gastric abnormality. Normal duodenal course and caliber. --Small bowel: No dilatation or inflammation. --Colon: No focal abnormality. --Appendix: Normal. 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 There is calcific atherosclerosis of the aorta. The portal vein, splenic vein and superior mesenteric vein are patent. The IVC is patent. There is a 12 mm retrocaval lymph node, unchanged. Multiple  subcentimeter retroperitoneal nodes. Reproductive: There is a small amount of free fluid in the pelvis. Normal uterus. No adnexal mass. Musculoskeletal. Mixed density appearance of L2 is unchanged. Small sclerotic focus within the S1 body has slightly increased in size. Other: None. IMPRESSION: 1. Acute pulmonary emboli within multiple bilateral lobar and segmental branches. No CT evidence of acute right heart strain. 2. Widespread ground-glass opacities, new since the prior study. This could indicate infection or post treatment change. Lymphangitic tumor spread is also possible. New small bilateral pleural effusions. 3. Unchanged size of right hilar mass and mediastinal lymphadenopathy. Right middle lobe dominant nodule has decreased in size. 4. 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 Unchanged appearance of liver metastases allowing for differences in contrast timing. 6. Multiple osseous metastases, worsened clearly at T5. Aortic Atherosclerosis (ICD10-I70.0). Electronically Signed: By: Ulyses Jarred M.D. On: 03/17/2021 23:39   CT Angio Chest PE W and/or Wo Contrast  Result Date: 02/27/2021 CLINICAL DATA:  PE suspected, widely metastatic lung cancer EXAM: CT ANGIOGRAPHY CHEST WITH CONTRAST TECHNIQUE: Multidetector CT imaging of the chest was performed using the standard protocol during bolus administration of intravenous contrast. Multiplanar CT image reconstructions and MIPs were obtained to evaluate the vascular anatomy. CONTRAST:  28m OMNIPAQUE IOHEXOL 350 MG/ML SOLN COMPARISON:  02/15/2021 FINDINGS: Cardiovascular: Satisfactory opacification of the pulmonary arteries to the segmental level. No evidence of pulmonary embolism. Cardiomegaly. Left and right coronary artery calcifications and stents. No pericardial effusion. Mediastinum/Nodes: Redemonstrated, very extensive bulky bilateral hilar, mediastinal, supraclavicular, and left axillary  lymphadenopathy. Thyroid gland, trachea, and esophagus demonstrate no significant findings. Lungs/Pleura: Diffuse bilateral bronchial wall thickening. Spiculated right upper lobe mass as seen on prior examination (series 7, image 42). Numerous redemonstrated small pulmonary nodules throughout the lungs. There is new, extensive ground-glass airspace opacity throughout the lungs, somewhat geographic in the upper lobes (series 7, image 33), although somewhat more nodular appearing in the lower lungs (series 7, image 58). No pleural effusion or pneumothorax. Upper Abdomen: No acute abnormality. Multiple low-attenuation lesions of liver, better assessed by prior dedicated of the abdomen. Musculoskeletal: No chest wall abnormality. Multiple osseous metastatic lesions, most significantly a lytic lesion of the T9 vertebral body (series 9, image 58). Review of the MIP images confirms the above findings. IMPRESSION: 1. Negative examination for pulmonary embolism. 2. There is new, extensive ground-glass airspace opacity throughout the lungs, somewhat geographic in the upper lobes, although somewhat more nodular appearing in the lower  lungs. Findings are nonspecific and infectious or inflammatory, differential considerations primarily include drug toxicity and atypical/viral infection. 3. Diffuse bilateral bronchial wall thickening, consistent with nonspecific infectious or inflammatory bronchitis. 4. Redemonstrated findings of advanced metastatic lung malignancy, better assessed by recent prior staging examination. 5. Coronary artery disease. Electronically Signed   By: Eddie Candle M.D.   On: 02/27/2021 10:49   CT Thoracic Spine Wo Contrast  Result Date: 02/18/2021 CLINICAL DATA:  Metastases on CT abdomen EXAM: CT THORACIC AND LUMBAR SPINE WITHOUT CONTRAST TECHNIQUE: Multidetector CT imaging of the thoracic and lumbar spine was performed without contrast. Multiplanar CT image reconstructions were also generated.  COMPARISON:  None. FINDINGS: CT THORACIC SPINE FINDINGS Alignment: Preserved. Vertebrae: There is a small partially sclerotic lesion at the right aspect of the T5 vertebral body. A lytic lesion is present at the anterior aspect T9 likely with pathologic fracture but no substantial height loss. Ill-defined lucent lesions are present elsewhere. Paraspinal and other soft tissues: Better evaluated on prior dedicated imaging. Disc levels: Multilevel degenerative changes. No high-grade degenerative canal narrowing. CT LUMBAR SPINE FINDINGS Segmentation: 5 lumbar type vertebrae. Alignment: Dextrocurvature.  Grade 1 anterolisthesis at L4-L5 Vertebrae: Lucency and sclerosis at L2 with pathologic compression fracture resulting in less than 50% loss of height at the superior endplate. Sclerotic lesion of the right sacrum. Paraspinal and other soft tissues: Better evaluated on prior dedicated imaging. Disc levels: Multilevel degenerative changes. Canal stenosis is greatest at L3-L4 and L4-L5. Foraminal narrowing is greatest on the right at L5-S1. IMPRESSION: Sclerotic and lytic metastatic lesions as already identified on the CT chest, abdomen, and pelvis. Pathologic L2 fracture with less than 50% loss of height. Probable pathologic fracture at T9 without height loss. No apparent significant epidural disease by CT. Electronically Signed   By: Macy Mis M.D.   On: 02/18/2021 10:53   CT Lumbar Spine Wo Contrast  Result Date: 02/18/2021 CLINICAL DATA:  Metastases on CT abdomen EXAM: CT THORACIC AND LUMBAR SPINE WITHOUT CONTRAST TECHNIQUE: Multidetector CT imaging of the thoracic and lumbar spine was performed without contrast. Multiplanar CT image reconstructions were also generated. COMPARISON:  None. FINDINGS: CT THORACIC SPINE FINDINGS Alignment: Preserved. Vertebrae: There is a small partially sclerotic lesion at the right aspect of the T5 vertebral body. A lytic lesion is present at the anterior aspect T9 likely with  pathologic fracture but no substantial height loss. Ill-defined lucent lesions are present elsewhere. Paraspinal and other soft tissues: Better evaluated on prior dedicated imaging. Disc levels: Multilevel degenerative changes. No high-grade degenerative canal narrowing. CT LUMBAR SPINE FINDINGS Segmentation: 5 lumbar type vertebrae. Alignment: Dextrocurvature.  Grade 1 anterolisthesis at L4-L5 Vertebrae: Lucency and sclerosis at L2 with pathologic compression fracture resulting in less than 50% loss of height at the superior endplate. Sclerotic lesion of the right sacrum. Paraspinal and other soft tissues: Better evaluated on prior dedicated imaging. Disc levels: Multilevel degenerative changes. Canal stenosis is greatest at L3-L4 and L4-L5. Foraminal narrowing is greatest on the right at L5-S1. IMPRESSION: Sclerotic and lytic metastatic lesions as already identified on the CT chest, abdomen, and pelvis. Pathologic L2 fracture with less than 50% loss of height. Probable pathologic fracture at T9 without height loss. No apparent significant epidural disease by CT. Electronically Signed   By: Macy Mis M.D.   On: 02/18/2021 10:53   NM Bone Scan Whole Body  Result Date: 02/27/2021 CLINICAL DATA:  Metastatic disease evaluation, lung mass, abnormal CT EXAM: NUCLEAR MEDICINE WHOLE BODY BONE SCAN TECHNIQUE:  Whole body anterior and posterior images were obtained approximately 3 hours after intravenous injection of radiopharmaceutical. RADIOPHARMACEUTICALS:  19.79 mCi Technetium-33mMDP IV COMPARISON:  None Correlation: CT chest abdomen pelvis 02/15/2021, CT thoracic and lumbar spine 02/18/2021, CT head 02/15/2021 FINDINGS: Multiple sites of abnormal tracer uptake are identified consistent with widespread osseous metastatic disease. These include calvaria greatest at occipital bone growth more on LEFT, thoracic and lumbar spine, RIGHT ribs, and pelvis. Dextroconvex thoracolumbar scoliosis. No definite abnormal  tracer uptake within the humeri or femora. Expected urinary tract and soft tissue distribution of tracer. IMPRESSION: Scattered osseous metastases as above. Electronically Signed   By: MLavonia DanaM.D.   On: 02/27/2021 18:17   CT Abdomen Pelvis W Contrast  Addendum Date: 03/17/2021   ADDENDUM REPORT: 03/17/2021 23:51 ADDENDUM: Critical Value/emergent results were called by telephone at the time of interpretation on 03/17/2021 at 11:46 pm to [facility] who verbally acknowledged these results. Electronically Signed   By: KUlyses JarredM.D.   On: 03/17/2021 23:51   Result Date: 03/17/2021 CLINICAL DATA:  Stage IV metastatic lung carcinoma. Discharged from ICU today. Lower abdominal pain after enema. EXAM: CT ANGIOGRAPHY CHEST CT ABDOMEN AND PELVIS WITH CONTRAST TECHNIQUE: Multidetector CT imaging of the chest was performed using the standard protocol during bolus administration of intravenous contrast. Multiplanar CT image reconstructions and MIPs were obtained to evaluate the vascular anatomy. Multidetector CT imaging of the abdomen and pelvis was performed using the standard protocol during bolus administration of intravenous contrast. CONTRAST:  758mOMNIPAQUE IOHEXOL 350 MG/ML SOLN COMPARISON:  None. FINDINGS: CTA CHEST FINDINGS Cardiovascular: Contrast injection is sufficient to demonstrate satisfactory opacification of the pulmonary arteries to the segmental level.There are filling defects within the proximal right middle lobar artery and within the posterior segmental branches of the right lower lobe. On the left, there are filling defects within the left lower lobe segmental branches. The main pulmonary artery is within normal limits for size. There is no CT evidence of acute right heart strain. Mild calcific aortic atherosclerosis. Aorta is otherwise normal. There is a normal 3-vessel arch branching pattern. Heart size is normal, without pericardial effusion. Mediastinum/Nodes: Unchanged  size of right hilar mass measuring 4.3 x 3.1 cm. Left hilar nodes are unchanged. Confluent pretracheal adenopathy also unchanged. Lungs/Pleura: Widespread ground-glass opacities are new since the prior study. Right middle lobe nodule (image 48) has decreased in size measuring 2.0 x 1.7 cm, previously 2.6 x 2.7 cm. There are small bilateral pleural effusions. 4 mm nodule in the left upper lobe is unchanged (image 42). Musculoskeletal: Sclerotic focus in the right aspect of the T5 vertebral body has markedly increased in size. Lytic lesion at T9 is unchanged. T7 lucent lesion is also unchanged. Review of the MIP images confirms the above findings. CT ABDOMEN and PELVIS FINDINGS Hepatobiliary: Unchanged hypodensity in the left hepatic lobe (2:12 measuring 12 mm. There is faint peripheral enhancement. There is also a small area of hyperenhancement more laterally at the same level (see aero). This is at the site of the hypodense focus seen on the previous CT. Subcapsular hypodensity with peripheral enhancement is unchanged (2:20). Status post cholecystectomy. Pancreas: Normal contours without ductal dilatation. No peripancreatic fluid collection. Spleen: Normal. Adrenals/Urinary Tract: --Adrenal glands: Normal. 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 --Left kidney/ureter: Proximal left ureter is dilated. There is focal hypoattenuation at the lower pole of the left kidney with suspected  tumor thrombus in the lower branches of the left renal artery. --Urinary bladder: Dilated Stomach/Bowel: --Stomach/Duodenum: No hiatal hernia or other gastric abnormality. Normal duodenal course and caliber. --Small bowel: No dilatation or inflammation. --Colon: No focal abnormality. --Appendix: Normal. 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 There is calcific atherosclerosis of the aorta. The  portal vein, splenic vein and superior mesenteric vein are patent. The IVC is patent. There is a 12 mm retrocaval lymph node, unchanged. Multiple subcentimeter retroperitoneal nodes. Reproductive: There is a small amount of free fluid in the pelvis. Normal uterus. No adnexal mass. Musculoskeletal. Mixed density appearance of L2 is unchanged. Small sclerotic focus within the S1 body has slightly increased in size. Other: None. IMPRESSION: 1. Acute pulmonary emboli within multiple bilateral lobar and segmental branches. No CT evidence of acute right heart strain. 2. Widespread ground-glass opacities, new since the prior study. This could indicate infection or post treatment change. Lymphangitic tumor spread is also possible. New small bilateral pleural effusions. 3. Unchanged size of right hilar mass and mediastinal lymphadenopathy. Right middle lobe dominant nodule has decreased in size. 4. 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 Unchanged appearance of liver metastases allowing for differences in contrast timing. 6. Multiple osseous metastases, worsened clearly at T5. Aortic Atherosclerosis (ICD10-I70.0). Electronically Signed: By: Ulyses Jarred M.D. On: 03/17/2021 23:39   US Renal  Result Date: 02/18/2021 CLINICAL DATA:  Back pain. Right renal pelvis fullness on right upper quadrant ultrasound performed 1 day prior EXAM: RENAL / URINARY TRACT ULTRASOUND COMPLETE COMPARISON:  02/17/2021 abdominal sonogram. FINDINGS: Right Kidney: Renal measurements: 10.3 x 4.2 x 4.2 cm = volume: 95 mL. Normal parenchymal echogenicity and thickness. Mild right hydronephrosis. Possible 5 mm right ureteropelvic junction stone. Nonobstructing 3 mm lower right renal stone. No renal masses. Left Kidney: Renal measurements: 10.0 x 4.8 x 4.2 cm = volume: 107 mL. Normal parenchymal echogenicity and thickness. No left hydronephrosis. No renal masses. Probable nonobstructing 7 mm  upper left renal stone. Bladder: Appears normal for degree of bladder distention. Bilateral ureteral jets seen in the bladder. Other: None. IMPRESSION: 1. Mild right hydronephrosis. Possible 5 mm right UPJ stone. Nonobstructing 3 mm lower right renal stone. Suggest unenhanced CT abdomen/pelvis for further evaluation. 2. Probable nonobstructing upper left renal stone. Electronically Signed   By: Ilona Sorrel M.D.   On: 02/18/2021 12:02   DG Chest Portable 1 View  Result Date: 03/17/2021 CLINICAL DATA:  Shortness of breath EXAM: PORTABLE CHEST 1 VIEW COMPARISON:  03/15/2021 FINDINGS: Worsening bilateral opacities throughout both lungs. No pneumothorax or sizable pleural effusion. Normal cardiomediastinal contours. IMPRESSION: Worsening bilateral airspace opacities. Electronically Signed   By: Ulyses Jarred M.D.   On: 03/17/2021 21:23   DG Chest Portable 1 View  Result Date: 03/15/2021 CLINICAL DATA:  Shortness of breath EXAM: PORTABLE CHEST 1 VIEW COMPARISON:  03/07/2021 FINDINGS: Cardiac shadow is stable. Patchy airspace opacities are again identified slightly improved when compared with the prior exam. No new focal infiltrate is seen. No bony abnormality is noted. IMPRESSION: Slight improvement in previously seen airspace opacities bilaterally. Electronically Signed   By: Inez Catalina M.D.   On: 03/15/2021 11:38   DG Chest Portable 1 View  Result Date: 03/07/2021 CLINICAL DATA:  Shortness of breath EXAM: PORTABLE CHEST 1 VIEW COMPARISON:  03/05/2021 FINDINGS: Patchy bilateral airspace disease throughout the lungs, slightly worsened since prior study. Nodular areas within the lungs, stable since prior study. Heart  is normal size. No effusions or acute bony abnormality. IMPRESSION: Worsening patchy bilateral airspace disease, likely worsening pneumonia. Scattered nodules unchanged. Electronically Signed   By: Rolm Baptise M.D.   On: 03/07/2021 21:41   DG Chest Port 1 View  Result Date:  02/27/2021 CLINICAL DATA:  Worsening shortness of breath since this morning. Recent diagnosis of lung cancer. EXAM: PORTABLE CHEST 1 VIEW COMPARISON:  Radiograph earlier today.  CT earlier today. FINDINGS: Stable heart size and mediastinal contours. Right hilar prominence and thickening of the lower paratracheal stripe related to known adenopathy. Unchanged right mid lung pulmonary nodule. Mild patchy bilateral suprahilar opacities corresponding to ground-glass opacity on CT earlier today. No significant change in the interim. No pneumothorax or large pleural effusion. Retained excreted IV contrast in the right renal collecting system with right hydronephrosis, partially included in the upper abdomen. IMPRESSION: 1. Stable radiographic appearance of the chest from earlier today. Similar patchy suprahilar opacities corresponding to ground-glass opacity on CT. 2. Right mid lung pulmonary nodule and thoracic adenopathy. 3. Partially included in the upper abdomen is retained excreted contrast in the right renal collecting system with right hydronephrosis. Right hydronephrosis was seen on renal ultrasound 02/18/2021. Electronically Signed   By: Keith Rake M.D.   On: 02/27/2021 15:04   DG Chest Portable 1 View  Result Date: 02/27/2021 CLINICAL DATA:  60 year old female with shortness of breath and cough. Metastatic lung cancer. EXAM: PORTABLE CHEST 1 VIEW COMPARISON:  02/15/2021 and prior studies FINDINGS: New hazy opacities within the central/upper lungs noted. RIGHT middle lobe mass and small scattered pulmonary nodules bilaterally are again identified. No pleural effusion or pneumothorax. No acute bony abnormalities are identified. IMPRESSION: New hazy opacities within the central/upper lungs which may represent edema or infection. Unchanged RIGHT middle lobe mass and bilateral pulmonary nodules compatible with known malignancy/metastases. Electronically Signed   By: Margarette Canada M.D.   On: 02/27/2021 08:49    [place] CORE BIOPSY (LYMPH NODES)  Result Date: 02/21/2021 INDICATION: Multiple enlarged lymph nodes, right middle lobe lung mass, liver lesions and bone lesions. EXAM: ULTRASOUND GUIDED CORE BIOPSY OF LEFT SUPRACLAVICULAR LYMPH NODE MEDICATIONS: None. ANESTHESIA/SEDATION: Versed 2.0 mg IV Moderate Sedation Time:  12 minutes. The patient was continuously monitored during the procedure by the interventional radiology nurse under my direct supervision. PROCEDURE: The procedure, risks, benefits, and alternatives were explained to the patient. Questions regarding the procedure were encouraged and answered. The patient understands and consents to the procedure. A time-out was performed prior to initiating the procedure. The left neck was prepped with chlorhexidine in a sterile fashion, and a sterile drape was applied covering the operative field. A sterile gown and sterile gloves were used for the procedure. Local anesthesia was provided with 1% Lidocaine. After localizing an enlarged left supraclavicular lymph node by ultrasound, multiple 18 gauge core biopsy samples were obtained. Core biopsy samples were submitted in formalin as well as on saline soaked Telfa gauze. Additional ultrasound was performed. COMPLICATIONS: None immediate. FINDINGS: An enlarged left supraclavicular lymph node measures approximately 3.0 x 1.8 x 2.9 cm. Solid core biopsy samples were obtained. IMPRESSION: Ultrasound-guided core biopsy performed of an enlarged left supraclavicular lymph node measuring 3 cm in greatest dimensions. Electronically Signed   By: Aletta Edouard M.D.   On: 02/21/2021 15:31   US Abdomen Limited RUQ (LIVER/GB)  Result Date: 02/17/2021 CLINICAL DATA:  Nausea and vomiting for 1 day. EXAM: ULTRASOUND ABDOMEN LIMITED RIGHT UPPER QUADRANT COMPARISON:  CT chest, abdomen and pelvis 02/15/2021.  FINDINGS: Gallbladder: Removed. Common bile duct: Diameter: 0.8 cm. Liver: Three hypoechoic liver lesions measuring up to 1.8 cm  are identified are identified and correlate with metastatic deposit seen on prior CT. Mild intrahepatic biliary ductal dilatation seen on CT is also noted. Portal vein is patent on color Doppler imaging with normal direction of blood flow towards the liver. Other: There is fullness of the right renal pelvis which is new since the patient's CT scan yesterday. IMPRESSION: Three hypoechoic liver lesions measuring up to 1.8 cm correlate with metastatic deposit seen on CT. Status post cholecystectomy. Mild intra and extrahepatic biliary ductal dilatation is likely related to cholecystectomy. No obstructing lesion is identified. Fullness of the right renal pelvis is new since yesterday's examination. This could be incidental. Correlation with dedicated renal ultrasound could be used for further evaluation if indicated. Electronically Signed   By: Inge Rise M.D.   On: 02/17/2021 12:53    PERFORMANCE STATUS (ECOG) : 1 - Symptomatic but completely ambulatory  Review of Systems Unable to complete  Physical Exam General: NAD Pulmonary: Unlabored Extremities: no edema, no joint deformities Skin: no rashes Neurological: Weakness, wakes briefly when stimulated  IMPRESSION: Patient seen in the ER.  She is currently comfortable appearing but is rather lethargic this morning.  She wakes briefly with stimulation.  Denies symptomatic complaints.  I met with her sister at bedside.  Her sister agrees that patient is doing poorly.  She says that she talked some last night with patient about her goals for treatment.  Her sister believes that patient cannot tolerate more cancer treatment.  We talked at length about the option of hospice involvement should patient opt to forego treatment and focus on comfort and quality of life.  Sister would prefer to take patient home with hospice.  However, it is unclear that patient will agree hospice care.  Patient previously verbalized a desire to continue pursuing cancer  treatment to both Dr. Tasia Catchings and myself.  It is unclear at present if patient has a viable path to more cancer treatment.  Her prognosis is poor.  Sister plans to speak with patient when she is able about her goals and option for hospice care.  PLAN: -Continue current scope of treatment -Sister plans to speak with patient about option for hospice -Will follow  Case and plan discussed with Dr. Tasia Catchings  Time Total: 60 minutes  Visit consisted of counseling and education dealing with the complex and emotionally intense issues of symptom management and palliative care in the setting of serious and potentially life-threatening illness.Greater than 50%  of this time was spent counseling and coordinating care related to the above assessment and plan.  Signed by: Altha Harm, PhD, NP-C

## 2021-03-18 NOTE — ED Notes (Signed)
MD with pt  

## 2021-03-18 NOTE — Progress Notes (Signed)
Long discussion with patient's sister.  Poor prognosis explained to her.  Explained the concept of comfort care.  Patient not being able to stay awake enough to have a conversation because she is in excruciating pain whenever pain medications start wearing off.  Patient's sister conferred with the other sibling who is in [place].  After further discussions it was decided to transition the patient to comfort care.  We will discontinue all other orders.  Bonnielee Haff  03/18/2021 6:00 PM

## 2021-03-18 NOTE — ED Notes (Signed)
Pt changed into clean brief and repositioned in bed. Pt resting comfortably with no other needs at this time. This tech sitting with pt and will continue to monitor.

## 2021-03-18 NOTE — ED Notes (Signed)
Pt awake at this time. Mittens removed per her request. Pt reoriented to room and purewick. Pt denies pain medication at this time.

## 2021-03-18 NOTE — Consult Note (Addendum)
Hematology/Oncology Consult note [facility] [phone] [phone]  Patient Care Team: Remi Haggard, FNP as PCP - General (Family Medicine) Remi Haggard, FNP (Family Medicine) Christene Lye, MD (General Surgery) Telford Nab, RN as Oncology Nurse Navigator   Name of the patient: Xyla Leisner  [id number]  Jan 31, 1961   Date of visit: 03/18/21 REASON FOR COSULTATION:  Acute pulmonary embolism, renal thrombus, metastatic lung disease History of presenting illness-  KENNETTA PAVLOVIC is a  60 y.o.  female with PMH listed below was seen in consultation at the request of  No ref. provider found  for evaluation of metastatic lung cancer, acute PE and renal vein infarct. Patient was recently hospitalized for HCAP versus lymphangitic spread of her lung cancer.  She is stated to be discharged yesterday and presented to emergency room just a few hours after discharge for evaluation of abdominal pain with nausea and vomiting.  Patient was also hypoxic into the 80s on room air.  Placed on 4 L of nasal cannula. 03/17/2021, CT chest PE protocol and abdomen pelvis showed acute pulmonary embolism within multiple bilateral lobar and segmental branches.  No CT evidence of acute right heart strain.  Widespread groundglass opacities, new since the prior study.  Lymphangitic tumor spread is also possible.  New small bilateral pleural effusions.  Unchanged size of right hilar mass and mediastinal lymphadenopathy.  Right middle lobe dominant nodule has decreased in size.  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  Unchanged appearance of liver metastasis.  Multiple osseous metastasis worsening clearly at T5. Patient was admitted and started on heparin drip.  Hematology oncology and palliative care service for consulted.  Patient was seen at bedside.  Sitter in room.  Patient is lethargic and she is not  able to provide any history.    Review of Systems  Unable to perform ROS: Mental status change    Allergies  Allergen Reactions  . Penicillin G Hives    Other reaction(s): HIVES Other reaction(s): HIVES  . Penicillin V Itching  . Penicillins     Patient Active Problem List   Diagnosis Date Noted  . Acute pulmonary embolism ([place]) 03/18/2021  . Opiate overdose ([place])   . Thrombocytopenia ([place]) 03/15/2021  . Hypokalemia 03/15/2021  . Hypomagnesemia 03/15/2021  . Acute respiratory failure with hypoxia ([place]) 03/07/2021  . Postobstructive pneumonia 02/27/2021  . HTN (hypertension) 02/27/2021  . Depression with anxiety 02/27/2021  . Normocytic anemia 02/27/2021  . Sepsis ([place]) 02/27/2021  . Protein-calorie malnutrition, moderate ([place]) 02/27/2021  . Malignant neoplasm of lung ([place]) 02/27/2021  . Elevated troponin 02/27/2021  . Toxic metabolic encephalopathy 93/79/0240  . Brain metastases ([place]) 02/27/2021  . Shortness of breath   . Metastatic malignant neoplasm ([place])   . Intractable low back pain 02/18/2021  . Anxiety   . Depressive disorder   . Pathologic fracture of lumbar vertebra, initial encounter   . 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 COPD exacerbation ([place])   . GERD (gastroesophageal reflux disease)   . Palliative care encounter   . Pathologic compression fracture of lumbar vertebra ([place])   . Goals of care, counseling/discussion   . Neoplasm related pain   . Iron deficiency anemia due to chronic blood loss 09/12/2019  . Family history of cancer 09/12/2019  . Blood in the stool 09/12/2019  . Elevated alkaline phosphatase level 09/12/2019  . Chronic low back pain 08/26/2014  Past Medical History:  Diagnosis Date  . Anxiety   . Benign neoplasm of cervix uteri   . Chronic hepatitis C without mention of hepatic coma   . Chronic low back pain 08/26/2014  . Constipation   . Depressive disorder   . Displacement of cervical intervertebral disc   .  Hypertensive disorder   . Iron deficiency anemia due to chronic blood loss 09/12/2019  . Palpitations   . Prolapsed cervical intervertebral disc      Past Surgical History:  Procedure Laterality Date  . CONIZATION CERVIX    . DILATION AND CURETTAGE OF UTERUS    . HAND SURGERY  2008,2015   Carpel Tunnel  . TONSILLECTOMY    . VULVECTOMY      Social History   Socioeconomic History  . Marital status: Single    Spouse name: Not on file  . Number of children: 0  . Years of education: college1  . Highest education level: Not on file  Occupational History    Employer: UNEMPLOYED  Tobacco Use  . Smoking status: Former Smoker    Packs/day: 0.70    Years: 35.00    Pack years: 24.50    Types: Cigarettes  . Smokeless tobacco: Never Used  Vaping Use  . Vaping Use: Never used  Substance and Sexual Activity  . Alcohol use: Yes    Comment: occasional wine  . Drug use: No  . Sexual activity: Not Currently  Other Topics Concern  . Not on file  Social History Narrative  . Not on file   Social Determinants of Health   Financial Resource Strain: Not on file  Food Insecurity: Not on file  Transportation Needs: Not on file  Physical Activity: Not on file  Stress: Not on file  Social Connections: Not on file  Intimate Partner Violence: Not on file     Family History  Problem Relation Age of Onset  . Breast cancer Mother 55  . Lung cancer Father   . Cancer Brother   . Cancer Other   . Stroke Other      Current Facility-Administered Medications:  .  0.9 %  sodium chloride infusion, , Intravenous, Continuous, Mansy, Jan A, MD, Last Rate: 100 mL/hr at 03/18/21 1205, Rate Verify at 03/18/21 1205 .  acetaminophen (TYLENOL) tablet 650 mg, 650 mg, Oral, Q6H PRN **OR** acetaminophen (TYLENOL) suppository 650 mg, 650 mg, Rectal, Q6H PRN, Mansy, Jan A, MD .  albuterol (VENTOLIN HFA) 108 (90 Base) MCG/ACT inhaler 2 puff, 2 puff, Inhalation, Q4H PRN, Mansy, Jan A, MD .  azithromycin  (ZITHROMAX) 500 mg in sodium chloride 0.9 % 250 mL IVPB, 500 mg, Intravenous, Q24H, Mansy, Jan A, MD .  busPIRone (BUSPAR) tablet 10 mg, 10 mg, Oral, BID, Mansy, Jan A, MD .  cefTRIAXone (ROCEPHIN) 2 g in sodium chloride 0.9 % 100 mL IVPB, 2 g, Intravenous, Q24H, Mansy, Arvella Merles, MD, Stopped at 03/18/21 [phone] .  dexamethasone (DECADRON) tablet 4 mg, 4 mg, Oral, BID, Mansy, Jan A, MD .  dextromethorphan-guaiFENesin [facility] DM) 30-600 MG per 12 hr tablet 1 tablet, 1 tablet, Oral, BID PRN, Mansy, Jan A, MD .  fentaNYL (DURAGESIC) 25 MCG/HR 1 patch, 1 patch, Transdermal, Q72H, Mansy, Jan A, MD, 1 patch at 03/18/21 0753 .  folic acid (FOLVITE) tablet 1 mg, 1 mg, Oral, Daily, Mansy, Jan A, MD .  furosemide (LASIX) tablet 20 mg, 20 mg, Oral, Daily, Mansy, Jan A, MD .  gabapentin (NEURONTIN) tablet 300 mg, 300  mg, Oral, QHS, Mansy, Jan A, MD .  guaiFENesin (MUCINEX) 12 hr tablet 1,200 mg, 1,200 mg, Oral, BID, Mansy, Jan A, MD .  heparin ADULT infusion 100 units/mL (25000 units/252mL), 800 Units/hr, Intravenous, Continuous, Oswald Hillock, RPH, Last Rate: 8 mL/hr at 03/18/21 0109, 800 Units/hr at 03/18/21 0109 .  HYDROmorphone (DILAUDID) injection 0.5-1 mg, 0.5-1 mg, Intravenous, Q2H PRN, Mansy, Jan A, MD, 1 mg at 03/18/21 1048 .  ipratropium-albuterol (DUONEB) 0.5-2.5 (3) MG/3ML nebulizer solution 3 mL, 3 mL, Nebulization, Q4H PRN, Mansy, Jan A, MD .  ipratropium-albuterol (DUONEB) 0.5-2.5 (3) MG/3ML nebulizer solution 3 mL, 3 mL, Nebulization, QID, Mansy, Jan A, MD .  labetalol (NORMODYNE) injection 20 mg, 20 mg, Intravenous, Q3H PRN, Mansy, Jan A, MD .  lidocaine-prilocaine (EMLA) cream 1 application, 1 application, Topical, Daily PRN, Mansy, Jan A, MD .  LORazepam (ATIVAN) injection 0.5 mg, 0.5 mg, Intravenous, Q4H PRN, Bonnielee Haff, MD .  magic mouthwash, 5 mL, Oral, TID PRN, Mansy, Jan A, MD .  magnesium hydroxide (MILK OF MAGNESIA) suspension 30 mL, 30 mL, Oral, Daily PRN, Mansy, Jan A, MD .   metoprolol tartrate (LOPRESSOR) tablet 50 mg, 50 mg, Oral, BID, Bonnielee Haff, MD .  morphine 2 MG/ML injection 2 mg, 2 mg, Intravenous, Q1H PRN, Mansy, Jan A, MD .  ondansetron (ZOFRAN) tablet 4 mg, 4 mg, Oral, Q6H PRN **OR** ondansetron (ZOFRAN) injection 4 mg, 4 mg, Intravenous, Q6H PRN, Mansy, Jan A, MD, 4 mg at 03/18/21 4481 .  oxyCODONE (Oxy IR/ROXICODONE) immediate release tablet 5 mg, 5 mg, Oral, Q4H PRN, Mansy, Jan A, MD .  oxyCODONE-acetaminophen (PERCOCET/ROXICET) 5-325 MG per tablet 1-2 tablet, 1-2 tablet, Oral, Q6H PRN, Mansy, Jan A, MD .  pantoprazole (PROTONIX) EC tablet 40 mg, 40 mg, Oral, Daily, Mansy, Jan A, MD .  potassium chloride SA (KLOR-CON) CR tablet 20 mEq, 20 mEq, Oral, Daily, Mansy, Jan A, MD .  promethazine (PHENERGAN) tablet 12.5 mg, 12.5 mg, Oral, Q6H PRN, Mansy, Jan A, MD .  Derrill Memo ON 03/19/2021] QUEtiapine (SEROQUEL) tablet 200 mg, 200 mg, Oral, QHS, Mansy, Jan A, MD .  traZODone (DESYREL) tablet 25 mg, 25 mg, Oral, QHS PRN, Mansy, Arvella Merles, MD  Current Outpatient Medications:  .  albuterol (VENTOLIN HFA) 108 (90 Base) MCG/ACT inhaler, Inhale 2 puffs into the lungs every 4 (four) hours as needed for wheezing or shortness of breath., Disp: 6.7 g, Rfl: 1 .  budesonide-formoterol (SYMBICORT) 80-4.5 MCG/ACT inhaler, Inhale 2 puffs into the lungs 2 (two) times daily., Disp: , Rfl:  .  busPIRone (BUSPAR) 10 MG tablet, Take 1 tablet (10 mg total) by mouth 2 (two) times daily., Disp: 60 tablet, Rfl: 2 .  dexamethasone (DECADRON) 4 MG tablet, Take 1 tablet (4 mg total) by mouth 2 (two) times daily., Disp: 60 tablet, Rfl: 1 .  folic acid (FOLVITE) 1 MG tablet, Take 1 tablet (1 mg total) by mouth daily. Start 5-7 days before Alimta chemotherapy. Continue until 21 days after Alimta completed., Disp: 100 tablet, Rfl: 3 .  furosemide (LASIX) 20 MG tablet, Take 1 tablet (20 mg total) by mouth daily., Disp: , Rfl:  .  gabapentin (NEURONTIN) 600 MG tablet, Take 0.5 tablets (300 mg  total) by mouth at bedtime., Disp: , Rfl:  .  guaiFENesin (MUCINEX) 600 MG 12 hr tablet, Take 2 tablets (1,200 mg total) by mouth 2 (two) times daily., Disp: 30 tablet, Rfl: 0 .  ipratropium-albuterol (DUONEB) 0.5-2.5 (3) MG/3ML SOLN, Take 3 mLs  by nebulization every 6 (six) hours as needed. (Patient taking differently: Take 3 mLs by nebulization every 6 (six) hours as needed (wheezing or shortness of breath).), Disp: 360 mL, Rfl: 3 .  lidocaine-prilocaine (EMLA) cream, Apply to affected area once (Patient taking differently: Apply 1 application topically daily as needed. Apply to affected area once), Disp: 30 g, Rfl: 3 .  magic mouthwash SOLN, Take 5 mLs by mouth 3 (three) times daily as needed for mouth pain., Disp: 300 mL, Rfl: 1 .  metoprolol tartrate (LOPRESSOR) 25 MG tablet, Take 0.5 tablets (12.5 mg total) by mouth 2 (two) times daily., Disp: 30 tablet, Rfl: 3 .  pantoprazole (PROTONIX) 40 MG tablet, Take 40 mg by mouth daily., Disp: , Rfl:  .  promethazine (PHENERGAN) 12.5 MG tablet, Take 1 tablet (12.5 mg total) by mouth every 6 (six) hours as needed for nausea or vomiting., Disp: 60 tablet, Rfl: 2 .  QUEtiapine (SEROQUEL) 400 MG tablet, Take 0.5 tablets (200 mg total) by mouth at bedtime., Disp: , Rfl:  .  clonazePAM (KLONOPIN) 0.5 MG tablet, Take 1 tablet (0.5 mg total) by mouth 2 (two) times daily as needed (anxiety)., Disp: 20 tablet, Rfl: 0 .  [START ON 03/20/2021] fentaNYL (DURAGESIC) 25 MCG/HR, Place 1 patch onto the skin every 3 (three) days., Disp: 5 patch, Rfl: 0 .  oxyCODONE-acetaminophen (PERCOCET/ROXICET) 5-325 MG tablet, Take 1-2 tablets by mouth every 6 (six) hours as needed for severe pain., Disp: , Rfl: 0 .  potassium chloride SA (KLOR-CON) 20 MEQ tablet, Take 1 tablet (20 mEq total) by mouth daily., Disp: , Rfl: 0   Physical exam:  Vitals:   03/18/21 0900 03/18/21 0930 03/18/21 1130 03/18/21 1200  BP: (!) 159/97 (!) 153/97 (!) 161/104 (!) 156/114  Pulse: (!) 118 (!) 116  (!) 120 (!) 117  Resp:  16 16 (!) 25  Temp:      TempSrc:      SpO2: 100% 98% 100% 100%  Weight:      Height:       Physical Exam      CMP Latest Ref Rng & Units 03/18/2021  Glucose 70 - 99 mg/dL 80  BUN 6 - 20 mg/dL 17  Creatinine 0.44 - 1.00 mg/dL 0.54  Sodium 135 - 145 mmol/L 136  Potassium 3.5 - 5.1 mmol/L 3.5  Chloride 98 - 111 mmol/L 105  CO2 22 - 32 mmol/L 22  Calcium 8.9 - 10.3 mg/dL 7.5(L)  Total Protein 6.5 - 8.1 g/dL -  Total Bilirubin 0.3 - 1.2 mg/dL -  Alkaline Phos 38 - 126 U/L -  AST 15 - 41 U/L -  ALT 0 - 44 U/L -   CBC Latest Ref Rng & Units 03/18/2021  WBC 4.0 - 10.5 K/uL 12.7(H)  Hemoglobin 12.0 - 15.0 g/dL 8.1(L)  Hematocrit 36.0 - 46.0 % 25.2(L)  Platelets 150 - 400 K/uL 49(L)    RADIOGRAPHIC STUDIES: I have personally reviewed the radiological images as listed and agreed with the findings in the report. DG Chest 2 View  Result Date: 03/05/2021 CLINICAL DATA:  Shortness of breath EXAM: CHEST - 2 VIEW COMPARISON:  02/27/2021 FINDINGS: Heart is normal size. Right upper lobe mass again noted as seen on CT. Patchy bilateral airspace disease again noted, similar to prior study. No effusions or acute bony abnormality. IMPRESSION: Patchy bilateral airspace disease again noted, not significantly changed. Right upper lobe mass again seen. Electronically Signed   By: Rolm Baptise M.D.  On: 03/05/2021 21:42   CT CHEST W CONTRAST  Result Date: 03/06/2021 CLINICAL DATA:  Respiratory failure, shortness of breath EXAM: CT CHEST WITH CONTRAST TECHNIQUE: Multidetector CT imaging of the chest was performed during intravenous contrast administration. CONTRAST:  65mL OMNIPAQUE IOHEXOL 300 MG/ML  SOLN COMPARISON:  02/27/2021.  Chest x-ray 03/05/2021 FINDINGS: Cardiovascular: Heart is normal size. Coronary artery and aortic calcifications. No aneurysm. Mediastinum/Nodes: Bulky mediastinal and bilateral hilar adenopathy again noted, unchanged. Trachea is patent. Thyroid  unremarkable. Lungs/Pleura: Ground-glass airspace opacities in the lower lobes have worsened since prior study, particularly in the left lower lobe. Ground-glass opacities in the upper lobes somewhat improved since prior study. Numerous bilateral pulmonary nodules are stable. Right upper lobe mass again noted and stable. No effusions. Upper Abdomen: Imaging into the upper abdomen demonstrates no acute findings. Musculoskeletal: Chest wall soft tissues are unremarkable. Multiple osseous metastases again noted, unchanged. IMPRESSION: Redemonstrated is extensive metastatic disease in the chest with numerous pulmonary nodules, dominant right upper lobe mass, and bulky mediastinal/bilateral hilar adenopathy. Ground-glass opacities have increased in the lower lobes, left greater than right, likely infectious/inflammatory. Somewhat improvement in ground-glass opacities in the upper lobe since prior study. Stable osseous metastatic disease. Coronary artery disease. Aortic Atherosclerosis (ICD10-I70.0). Electronically Signed   By: Rolm Baptise M.D.   On: 03/06/2021 00:11   CT Angio Chest PE W/Cm &/Or Wo Cm  Addendum Date: 03/17/2021   ADDENDUM REPORT: 03/17/2021 23:51 ADDENDUM: Critical Value/emergent results were called by telephone at the time of interpretation on 03/17/2021 at 11:46 pm to provider [facility] , who verbally acknowledged these results. Electronically Signed   By: Ulyses Jarred M.D.   On: 03/17/2021 23:51   Result Date: 03/17/2021 CLINICAL DATA:  Stage IV metastatic lung carcinoma. Discharged from ICU today. Lower abdominal pain after enema. EXAM: CT ANGIOGRAPHY CHEST CT ABDOMEN AND PELVIS WITH CONTRAST TECHNIQUE: Multidetector CT imaging of the chest was performed using the standard protocol during bolus administration of intravenous contrast. Multiplanar CT image reconstructions and MIPs were obtained to evaluate the vascular anatomy. Multidetector CT imaging of the abdomen and pelvis was  performed using the standard protocol during bolus administration of intravenous contrast. CONTRAST:  79mL OMNIPAQUE IOHEXOL 350 MG/ML SOLN COMPARISON:  None. FINDINGS: CTA CHEST FINDINGS Cardiovascular: Contrast injection is sufficient to demonstrate satisfactory opacification of the pulmonary arteries to the segmental level.There are filling defects within the proximal right middle lobar artery and within the posterior segmental branches of the right lower lobe. On the left, there are filling defects within the left lower lobe segmental branches. The main pulmonary artery is within normal limits for size. There is no CT evidence of acute right heart strain. Mild calcific aortic atherosclerosis. Aorta is otherwise normal. There is a normal 3-vessel arch branching pattern. Heart size is normal, without pericardial effusion. Mediastinum/Nodes: Unchanged size of right hilar mass measuring 4.3 x 3.1 cm. Left hilar nodes are unchanged. Confluent pretracheal adenopathy also unchanged. Lungs/Pleura: Widespread ground-glass opacities are new since the prior study. Right middle lobe nodule (image 48) has decreased in size measuring 2.0 x 1.7 cm, previously 2.6 x 2.7 cm. There are small bilateral pleural effusions. 4 mm nodule in the left upper lobe is unchanged (image 42). Musculoskeletal: Sclerotic focus in the right aspect of the T5 vertebral body has markedly increased in size. Lytic lesion at T9 is unchanged. T7 lucent lesion is also unchanged. Review of the MIP images confirms the above findings. CT ABDOMEN and PELVIS FINDINGS Hepatobiliary: Unchanged hypodensity  in the left hepatic lobe (2:12 measuring 12 mm. There is faint peripheral enhancement. There is also a small area of hyperenhancement more laterally at the same level (see aero). This is at the site of the hypodense focus seen on the previous CT. Subcapsular hypodensity with peripheral enhancement is unchanged (2:20). Status post cholecystectomy. Pancreas:  Normal contours without ductal dilatation. No peripancreatic fluid collection. Spleen: Normal. Adrenals/Urinary Tract: --Adrenal glands: Normal. 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 --Left kidney/ureter: Proximal left ureter is dilated. There is focal hypoattenuation at the lower pole of the left kidney with suspected tumor thrombus in the lower branches of the left renal artery. --Urinary bladder: Dilated Stomach/Bowel: --Stomach/Duodenum: No hiatal hernia or other gastric abnormality. Normal duodenal course and caliber. --Small bowel: No dilatation or inflammation. --Colon: No focal abnormality. --Appendix: Normal. 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 There is calcific atherosclerosis of the aorta. The portal vein, splenic vein and superior mesenteric vein are patent. The IVC is patent. There is a 12 mm retrocaval lymph node, unchanged. Multiple subcentimeter retroperitoneal nodes. Reproductive: There is a small amount of free fluid in the pelvis. Normal uterus. No adnexal mass. Musculoskeletal. Mixed density appearance of L2 is unchanged. Small sclerotic focus within the S1 body has slightly increased in size. Other: None. IMPRESSION: 1. Acute pulmonary emboli within multiple bilateral lobar and segmental branches. No CT evidence of acute right heart strain. 2. Widespread ground-glass opacities, new since the prior study. This could indicate infection or post treatment change. Lymphangitic tumor spread is also possible. New small bilateral pleural effusions. 3. Unchanged size of right hilar mass and mediastinal lymphadenopathy. Right middle lobe dominant nodule has decreased in size. 4. 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 Unchanged appearance of liver metastases allowing for differences in contrast  timing. 6. Multiple osseous metastases, worsened clearly at T5. Aortic Atherosclerosis (ICD10-I70.0). Electronically Signed: By: Ulyses Jarred M.D. On: 03/17/2021 23:39   CT Angio Chest PE W and/or Wo Contrast  Result Date: 02/27/2021 CLINICAL DATA:  PE suspected, widely metastatic lung cancer EXAM: CT ANGIOGRAPHY CHEST WITH CONTRAST TECHNIQUE: Multidetector CT imaging of the chest was performed using the standard protocol during bolus administration of intravenous contrast. Multiplanar CT image reconstructions and MIPs were obtained to evaluate the vascular anatomy. CONTRAST:  77mL OMNIPAQUE IOHEXOL 350 MG/ML SOLN COMPARISON:  02/15/2021 FINDINGS: Cardiovascular: Satisfactory opacification of the pulmonary arteries to the segmental level. No evidence of pulmonary embolism. Cardiomegaly. Left and right coronary artery calcifications and stents. No pericardial effusion. Mediastinum/Nodes: Redemonstrated, very extensive bulky bilateral hilar, mediastinal, supraclavicular, and left axillary lymphadenopathy. Thyroid gland, trachea, and esophagus demonstrate no significant findings. Lungs/Pleura: Diffuse bilateral bronchial wall thickening. Spiculated right upper lobe mass as seen on prior examination (series 7, image 42). Numerous redemonstrated small pulmonary nodules throughout the lungs. There is new, extensive ground-glass airspace opacity throughout the lungs, somewhat geographic in the upper lobes (series 7, image 33), although somewhat more nodular appearing in the lower lungs (series 7, image 58). No pleural effusion or pneumothorax. Upper Abdomen: No acute abnormality. Multiple low-attenuation lesions of liver, better assessed by prior dedicated of the abdomen. Musculoskeletal: No chest wall abnormality. Multiple osseous metastatic lesions, most significantly a lytic lesion of the T9 vertebral body (series 9, image 58). Review of the MIP images confirms the above findings. IMPRESSION: 1. Negative  examination for pulmonary embolism. 2.  There is new, extensive ground-glass airspace opacity throughout the lungs, somewhat geographic in the upper lobes, although somewhat more nodular appearing in the lower lungs. Findings are nonspecific and infectious or inflammatory, differential considerations primarily include drug toxicity and atypical/viral infection. 3. Diffuse bilateral bronchial wall thickening, consistent with nonspecific infectious or inflammatory bronchitis. 4. Redemonstrated findings of advanced metastatic lung malignancy, better assessed by recent prior staging examination. 5. Coronary artery disease. Electronically Signed   By: Eddie Candle M.D.   On: 02/27/2021 10:49   CT Thoracic Spine Wo Contrast  Result Date: 02/18/2021 CLINICAL DATA:  Metastases on CT abdomen EXAM: CT THORACIC AND LUMBAR SPINE WITHOUT CONTRAST TECHNIQUE: Multidetector CT imaging of the thoracic and lumbar spine was performed without contrast. Multiplanar CT image reconstructions were also generated. COMPARISON:  None. FINDINGS: CT THORACIC SPINE FINDINGS Alignment: Preserved. Vertebrae: There is a small partially sclerotic lesion at the right aspect of the T5 vertebral body. A lytic lesion is present at the anterior aspect T9 likely with pathologic fracture but no substantial height loss. Ill-defined lucent lesions are present elsewhere. Paraspinal and other soft tissues: Better evaluated on prior dedicated imaging. Disc levels: Multilevel degenerative changes. No high-grade degenerative canal narrowing. CT LUMBAR SPINE FINDINGS Segmentation: 5 lumbar type vertebrae. Alignment: Dextrocurvature.  Grade 1 anterolisthesis at L4-L5 Vertebrae: Lucency and sclerosis at L2 with pathologic compression fracture resulting in less than 50% loss of height at the superior endplate. Sclerotic lesion of the right sacrum. Paraspinal and other soft tissues: Better evaluated on prior dedicated imaging. Disc levels: Multilevel degenerative  changes. Canal stenosis is greatest at L3-L4 and L4-L5. Foraminal narrowing is greatest on the right at L5-S1. IMPRESSION: Sclerotic and lytic metastatic lesions as already identified on the CT chest, abdomen, and pelvis. Pathologic L2 fracture with less than 50% loss of height. Probable pathologic fracture at T9 without height loss. No apparent significant epidural disease by CT. Electronically Signed   By: Macy Mis M.D.   On: 02/18/2021 10:53   CT Lumbar Spine Wo Contrast  Result Date: 02/18/2021 CLINICAL DATA:  Metastases on CT abdomen EXAM: CT THORACIC AND LUMBAR SPINE WITHOUT CONTRAST TECHNIQUE: Multidetector CT imaging of the thoracic and lumbar spine was performed without contrast. Multiplanar CT image reconstructions were also generated. COMPARISON:  None. FINDINGS: CT THORACIC SPINE FINDINGS Alignment: Preserved. Vertebrae: There is a small partially sclerotic lesion at the right aspect of the T5 vertebral body. A lytic lesion is present at the anterior aspect T9 likely with pathologic fracture but no substantial height loss. Ill-defined lucent lesions are present elsewhere. Paraspinal and other soft tissues: Better evaluated on prior dedicated imaging. Disc levels: Multilevel degenerative changes. No high-grade degenerative canal narrowing. CT LUMBAR SPINE FINDINGS Segmentation: 5 lumbar type vertebrae. Alignment: Dextrocurvature.  Grade 1 anterolisthesis at L4-L5 Vertebrae: Lucency and sclerosis at L2 with pathologic compression fracture resulting in less than 50% loss of height at the superior endplate. Sclerotic lesion of the right sacrum. Paraspinal and other soft tissues: Better evaluated on prior dedicated imaging. Disc levels: Multilevel degenerative changes. Canal stenosis is greatest at L3-L4 and L4-L5. Foraminal narrowing is greatest on the right at L5-S1. IMPRESSION: Sclerotic and lytic metastatic lesions as already identified on the CT chest, abdomen, and pelvis. Pathologic L2  fracture with less than 50% loss of height. Probable pathologic fracture at T9 without height loss. No apparent significant epidural disease by CT. Electronically Signed   By: Macy Mis M.D.   On: 02/18/2021 10:53   NM Bone Scan  Whole Body  Result Date: 02/27/2021 CLINICAL DATA:  Metastatic disease evaluation, lung mass, abnormal CT EXAM: NUCLEAR MEDICINE WHOLE BODY BONE SCAN TECHNIQUE: Whole body anterior and posterior images were obtained approximately 3 hours after intravenous injection of radiopharmaceutical. RADIOPHARMACEUTICALS:  19.79 mCi Technetium-48m MDP IV COMPARISON:  None Correlation: CT chest abdomen pelvis 02/15/2021, CT thoracic and lumbar spine 02/18/2021, CT head 02/15/2021 FINDINGS: Multiple sites of abnormal tracer uptake are identified consistent with widespread osseous metastatic disease. These include calvaria greatest at occipital bone growth more on LEFT, thoracic and lumbar spine, RIGHT ribs, and pelvis. Dextroconvex thoracolumbar scoliosis. No definite abnormal tracer uptake within the humeri or femora. Expected urinary tract and soft tissue distribution of tracer. IMPRESSION: Scattered osseous metastases as above. Electronically Signed   By: Lavonia Dana M.D.   On: 02/27/2021 18:17   CT Abdomen Pelvis W Contrast  Addendum Date: 03/17/2021   ADDENDUM REPORT: 03/17/2021 23:51 ADDENDUM: Critical Value/emergent results were called by telephone at the time of interpretation on 03/17/2021 at 11:46 pm to provider [facility] , who verbally acknowledged these results. Electronically Signed   By: Ulyses Jarred M.D.   On: 03/17/2021 23:51   Result Date: 03/17/2021 CLINICAL DATA:  Stage IV metastatic lung carcinoma. Discharged from ICU today. Lower abdominal pain after enema. EXAM: CT ANGIOGRAPHY CHEST CT ABDOMEN AND PELVIS WITH CONTRAST TECHNIQUE: Multidetector CT imaging of the chest was performed using the standard protocol during bolus administration of intravenous contrast.  Multiplanar CT image reconstructions and MIPs were obtained to evaluate the vascular anatomy. Multidetector CT imaging of the abdomen and pelvis was performed using the standard protocol during bolus administration of intravenous contrast. CONTRAST:  36mL OMNIPAQUE IOHEXOL 350 MG/ML SOLN COMPARISON:  None. FINDINGS: CTA CHEST FINDINGS Cardiovascular: Contrast injection is sufficient to demonstrate satisfactory opacification of the pulmonary arteries to the segmental level.There are filling defects within the proximal right middle lobar artery and within the posterior segmental branches of the right lower lobe. On the left, there are filling defects within the left lower lobe segmental branches. The main pulmonary artery is within normal limits for size. There is no CT evidence of acute right heart strain. Mild calcific aortic atherosclerosis. Aorta is otherwise normal. There is a normal 3-vessel arch branching pattern. Heart size is normal, without pericardial effusion. Mediastinum/Nodes: Unchanged size of right hilar mass measuring 4.3 x 3.1 cm. Left hilar nodes are unchanged. Confluent pretracheal adenopathy also unchanged. Lungs/Pleura: Widespread ground-glass opacities are new since the prior study. Right middle lobe nodule (image 48) has decreased in size measuring 2.0 x 1.7 cm, previously 2.6 x 2.7 cm. There are small bilateral pleural effusions. 4 mm nodule in the left upper lobe is unchanged (image 42). Musculoskeletal: Sclerotic focus in the right aspect of the T5 vertebral body has markedly increased in size. Lytic lesion at T9 is unchanged. T7 lucent lesion is also unchanged. Review of the MIP images confirms the above findings. CT ABDOMEN and PELVIS FINDINGS Hepatobiliary: Unchanged hypodensity in the left hepatic lobe (2:12 measuring 12 mm. There is faint peripheral enhancement. There is also a small area of hyperenhancement more laterally at the same level (see aero). This is at the site of the  hypodense focus seen on the previous CT. Subcapsular hypodensity with peripheral enhancement is unchanged (2:20). Status post cholecystectomy. Pancreas: Normal contours without ductal dilatation. No peripancreatic fluid collection. Spleen: Normal. Adrenals/Urinary Tract: --Adrenal glands: Normal. --Right kidney/ureter: Interpolar right kidney is hypodense and expanded with suspected tumor thrombus throughout the distal  right renal artery. --Left kidney/ureter: Proximal left ureter is dilated. There is focal hypoattenuation at the lower pole of the left kidney with suspected tumor thrombus in the lower branches of the left renal artery. --Urinary bladder: Dilated Stomach/Bowel: --Stomach/Duodenum: No hiatal hernia or other gastric abnormality. Normal duodenal course and caliber. --Small bowel: No dilatation or inflammation. --Colon: No focal abnormality. --Appendix: Normal. 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 There is calcific atherosclerosis of the aorta. The portal vein, splenic vein and superior mesenteric vein are patent. The IVC is patent. There is a 12 mm retrocaval lymph node, unchanged. Multiple subcentimeter retroperitoneal nodes. Reproductive: There is a small amount of free fluid in the pelvis. Normal uterus. No adnexal mass. Musculoskeletal. Mixed density appearance of L2 is unchanged. Small sclerotic focus within the S1 body has slightly increased in size. Other: None. IMPRESSION: 1. Acute pulmonary emboli within multiple bilateral lobar and segmental branches. No CT evidence of acute right heart strain. 2. Widespread ground-glass opacities, new since the prior study. This could indicate infection or post treatment change. Lymphangitic tumor spread is also possible. New small bilateral pleural effusions. 3. Unchanged size of right hilar mass and mediastinal lymphadenopathy. Right middle lobe dominant nodule has decreased in size. 4. 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 Unchanged appearance of liver metastases allowing for differences in contrast timing. 6. Multiple osseous metastases, worsened clearly at T5. Aortic Atherosclerosis (ICD10-I70.0). Electronically Signed: By: Ulyses Jarred M.D. On: 03/17/2021 23:39   US Renal  Result Date: 02/18/2021 CLINICAL DATA:  Back pain. Right renal pelvis fullness on right upper quadrant ultrasound performed 1 day prior EXAM: RENAL / URINARY TRACT ULTRASOUND COMPLETE COMPARISON:  02/17/2021 abdominal sonogram. FINDINGS: Right Kidney: Renal measurements: 10.3 x 4.2 x 4.2 cm = volume: 95 mL. Normal parenchymal echogenicity and thickness. Mild right hydronephrosis. Possible 5 mm right ureteropelvic junction stone. Nonobstructing 3 mm lower right renal stone. No renal masses. Left Kidney: Renal measurements: 10.0 x 4.8 x 4.2 cm = volume: 107 mL. Normal parenchymal echogenicity and thickness. No left hydronephrosis. No renal masses. Probable nonobstructing 7 mm upper left renal stone. Bladder: Appears normal for degree of bladder distention. Bilateral ureteral jets seen in the bladder. Other: None. IMPRESSION: 1. Mild right hydronephrosis. Possible 5 mm right UPJ stone. Nonobstructing 3 mm lower right renal stone. Suggest unenhanced CT abdomen/pelvis for further evaluation. 2. Probable nonobstructing upper left renal stone. Electronically Signed   By: Ilona Sorrel M.D.   On: 02/18/2021 12:02   DG Chest Portable 1 View  Result Date: 03/17/2021 CLINICAL DATA:  Shortness of breath EXAM: PORTABLE CHEST 1 VIEW COMPARISON:  03/15/2021 FINDINGS: Worsening bilateral opacities throughout both lungs. No pneumothorax or sizable pleural effusion. Normal cardiomediastinal contours. IMPRESSION: Worsening bilateral airspace opacities. Electronically Signed   By: Ulyses Jarred M.D.   On: 03/17/2021 21:23   DG Chest Portable 1 View  Result Date: 03/15/2021 CLINICAL DATA:   Shortness of breath EXAM: PORTABLE CHEST 1 VIEW COMPARISON:  03/07/2021 FINDINGS: Cardiac shadow is stable. Patchy airspace opacities are again identified slightly improved when compared with the prior exam. No new focal infiltrate is seen. No bony abnormality is noted. IMPRESSION: Slight improvement in previously seen airspace opacities bilaterally. Electronically Signed   By: Inez Catalina M.D.   On: 03/15/2021 11:38   DG Chest Portable 1 View  Result Date: 03/07/2021 CLINICAL DATA:  Shortness of breath EXAM: PORTABLE CHEST 1 VIEW COMPARISON:  03/05/2021 FINDINGS: Patchy bilateral airspace disease throughout the lungs, slightly worsened since prior study. Nodular areas within the lungs, stable since prior study. Heart is normal size. No effusions or acute bony abnormality. IMPRESSION: Worsening patchy bilateral airspace disease, likely worsening pneumonia. Scattered nodules unchanged. Electronically Signed   By: Rolm Baptise M.D.   On: 03/07/2021 21:41   DG Chest Port 1 View  Result Date: 02/27/2021 CLINICAL DATA:  Worsening shortness of breath since this morning. Recent diagnosis of lung cancer. EXAM: PORTABLE CHEST 1 VIEW COMPARISON:  Radiograph earlier today.  CT earlier today. FINDINGS: Stable heart size and mediastinal contours. Right hilar prominence and thickening of the lower paratracheal stripe related to known adenopathy. Unchanged right mid lung pulmonary nodule. Mild patchy bilateral suprahilar opacities corresponding to ground-glass opacity on CT earlier today. No significant change in the interim. No pneumothorax or large pleural effusion. Retained excreted IV contrast in the right renal collecting system with right hydronephrosis, partially included in the upper abdomen. IMPRESSION: 1. Stable radiographic appearance of the chest from earlier today. Similar patchy suprahilar opacities corresponding to ground-glass opacity on CT. 2. Right mid lung pulmonary nodule and thoracic adenopathy. 3.  Partially included in the upper abdomen is retained excreted contrast in the right renal collecting system with right hydronephrosis. Right hydronephrosis was seen on renal ultrasound 02/18/2021. Electronically Signed   By: Keith Rake M.D.   On: 02/27/2021 15:04   DG Chest Portable 1 View  Result Date: 02/27/2021 CLINICAL DATA:  60 year old female with shortness of breath and cough. Metastatic lung cancer. EXAM: PORTABLE CHEST 1 VIEW COMPARISON:  02/15/2021 and prior studies FINDINGS: New hazy opacities within the central/upper lungs noted. RIGHT middle lobe mass and small scattered pulmonary nodules bilaterally are again identified. No pleural effusion or pneumothorax. No acute bony abnormalities are identified. IMPRESSION: New hazy opacities within the central/upper lungs which may represent edema or infection. Unchanged RIGHT middle lobe mass and bilateral pulmonary nodules compatible with known malignancy/metastases. Electronically Signed   By: Margarette Canada M.D.   On: 02/27/2021 08:49   [place] CORE BIOPSY (LYMPH NODES)  Result Date: 02/21/2021 INDICATION: Multiple enlarged lymph nodes, right middle lobe lung mass, liver lesions and bone lesions. EXAM: ULTRASOUND GUIDED CORE BIOPSY OF LEFT SUPRACLAVICULAR LYMPH NODE MEDICATIONS: None. ANESTHESIA/SEDATION: Versed 2.0 mg IV Moderate Sedation Time:  12 minutes. The patient was continuously monitored during the procedure by the interventional radiology nurse under my direct supervision. PROCEDURE: The procedure, risks, benefits, and alternatives were explained to the patient. Questions regarding the procedure were encouraged and answered. The patient understands and consents to the procedure. A time-out was performed prior to initiating the procedure. The left neck was prepped with chlorhexidine in a sterile fashion, and a sterile drape was applied covering the operative field. A sterile gown and sterile gloves were used for the procedure. Local anesthesia  was provided with 1% Lidocaine. After localizing an enlarged left supraclavicular lymph node by ultrasound, multiple 18 gauge core biopsy samples were obtained. Core biopsy samples were submitted in formalin as well as on saline soaked Telfa gauze. Additional ultrasound was performed. COMPLICATIONS: None immediate. FINDINGS: An enlarged left supraclavicular lymph node measures approximately 3.0 x 1.8 x 2.9 cm. Solid core biopsy samples were obtained. IMPRESSION: Ultrasound-guided core biopsy performed of an enlarged left supraclavicular lymph node measuring 3 cm in greatest dimensions. Electronically Signed   By: Aletta Edouard M.D.   On: 02/21/2021 15:31   US Abdomen Limited RUQ (LIVER/GB)  Result Date: 02/17/2021  CLINICAL DATA:  Nausea and vomiting for 1 day. EXAM: ULTRASOUND ABDOMEN LIMITED RIGHT UPPER QUADRANT COMPARISON:  CT chest, abdomen and pelvis 02/15/2021. FINDINGS: Gallbladder: Removed. Common bile duct: Diameter: 0.8 cm. Liver: Three hypoechoic liver lesions measuring up to 1.8 cm are identified are identified and correlate with metastatic deposit seen on prior CT. Mild intrahepatic biliary ductal dilatation seen on CT is also noted. Portal vein is patent on color Doppler imaging with normal direction of blood flow towards the liver. Other: There is fullness of the right renal pelvis which is new since the patient's CT scan yesterday. IMPRESSION: Three hypoechoic liver lesions measuring up to 1.8 cm correlate with metastatic deposit seen on CT. Status post cholecystectomy. Mild intra and extrahepatic biliary ductal dilatation is likely related to cholecystectomy. No obstructing lesion is identified. Fullness of the right renal pelvis is new since yesterday's examination. This could be incidental. Correlation with dedicated renal ultrasound could be used for further evaluation if indicated. Electronically Signed   By: Inge Rise M.D.   On: 02/17/2021 12:53    Assessment and plan-   acute  bilateral pulmonary embolism in the setting of metastatic lung cancer. Hypercarbic state due to extensive tumor burden. I agree with heparin drip with no bolus for anticoagulation.  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 Hold heparin if platelet count drops below 35,000.   # Thrombocytopenia is likely due to recent chemotherapy. Anticipate platelet count to recover in a few days.  # anemia, due to chemotherapy. Count is stable.   Acute respiratory failure due to PE, as well as possible lymphangitic cancer progression. Empiric coverage for HCAP.  Metastatic lung cancer with massive tumor burden. Status post cycle 1 dose reduced chemotherapy few days ago. Patient tolerated poorly.  CT images were reviewed. Prognosis is extremely poor poor and I doubt she will recover well enough to receive the next cycle of chemotherapy. CODE STATUS DNR/DNI.  I had a lengthy discussion with patient's Sister [name] about option of comfort care/hospice.  Sister is interested in proceeding hospice and wants to further discuss with patient.  It is unclear that if patient would agree with hospice care or not.  Continue current scope of care.  #Brain lesion, destructive occipital bone mass/brain edema continue dexamethasone 4 mg twice daily. #Neoplasm related pain, history of drug overdose.  Continue current pain regimen, fentanyl 25 MCG/hour every 72 hours, oxycodone 5 mg every 4 hours as needed.   Thank you for allowing me to participate in the care of this patient.    Earlie Server, MD, PhD Hematology Oncology [facility] at [facility] Pager- [phone] 03/18/2021

## 2021-03-18 NOTE — ED Notes (Signed)
RN in room to give pt pain medication, pt calling out in pain and stating "I can't take this", pt non cooperative with RN trying to give pain medication. Pt was found to pull out her IV that has heparin running through. RN was able to administer pain medication.

## 2021-03-18 NOTE — Progress Notes (Signed)
[facility]) Hospital Liaison note:  This is a pending outpatient-based Palliative Care patient. Will continue to follow for disposition.  Please call with any outpatient palliative questions or concerns.  Thank you, Lorelee Market, LPN [facility] Liaison [phone]

## 2021-03-18 NOTE — ED Notes (Signed)
Sister at bedside, introduced self/updated on plan of care.

## 2021-03-18 NOTE — ED Notes (Signed)
Floor RN BM given report and notified to complete ED to IP handoff.

## 2021-03-18 NOTE — ED Notes (Signed)
Per MD, Gokul, palliative consult with family is required to downgrade to med-surg.

## 2021-03-18 NOTE — Consult Note (Signed)
[place] for Heparin Indication: pulmonary embolus  Allergies  Allergen Reactions  . Penicillin G Hives    Other reaction(s): HIVES Other reaction(s): HIVES  . Penicillin V Itching  . Penicillins     Patient Measurements: Height: 5\' 6"  (167.6 cm) Weight: 50.7 kg (111 lb 12.4 oz) IBW/kg (Calculated) : 59.3 Heparin Dosing Weight: 50.7 kg  Vital Signs: BP: 153/97 (04/15 0930) Pulse Rate: 116 (04/15 0930)  Labs: Recent Labs    03/15/21 1554 03/16/21 0314 03/17/21 0321 03/17/21 2051 03/17/21 2234 03/18/21 0056 03/18/21 0447 03/18/21 0747  HGB  --  7.6* 8.2* 8.8*  --   --  8.1*  --   HCT  --  23.6* 24.8* 26.6*  --   --  25.2*  --   PLT  --  99* 75* 63*  --   --  49*  --   APTT  --   --   --   --  29 28  --   --   LABPROT  --  14.7  --   --  15.2  --   --   --   INR  --  1.2  --   --  1.2  --   --   --   HEPARINUNFRC  --   --   --   --   --   --   --  0.47  CREATININE  --  0.72 0.61 0.70  --   --  0.54  --   TROPONINIHS 65*  --   --  2,813* 3,488*  --   --   --     Estimated Creatinine Clearance: 59.9 mL/min (by C-G formula based on SCr of 0.54 mg/dL).   Medical History: Past Medical History:  Diagnosis Date  . Anxiety   . Benign neoplasm of cervix uteri   . Chronic hepatitis C without mention of hepatic coma   . Chronic low back pain 08/26/2014  . Constipation   . Depressive disorder   . Displacement of cervical intervertebral disc   . Hypertensive disorder   . Iron deficiency anemia due to chronic blood loss 09/12/2019  . Palpitations   . Prolapsed cervical intervertebral disc     Medications:  (Not in a hospital admission)  Scheduled:  . busPIRone  10 mg Oral BID  . dexamethasone  4 mg Oral BID  . fentaNYL  1 patch Transdermal Q72H  . folic acid  1 mg Oral Daily  . furosemide  20 mg Oral Daily  . gabapentin  300 mg Oral QHS  . guaiFENesin  1,200 mg Oral BID  . ipratropium-albuterol  3 mL Nebulization QID  .  metoprolol tartrate  50 mg Oral BID  . pantoprazole  40 mg Oral Daily  . potassium chloride SA  20 mEq Oral Daily  . [START ON 03/19/2021] QUEtiapine  200 mg Oral QHS   Infusions:  . sodium chloride 100 mL/hr at 03/18/21 0213  . azithromycin    . cefTRIAXone (ROCEPHIN)  IV Stopped (03/18/21 0853)  . heparin 800 Units/hr (03/18/21 0109)   PRN:  Anti-infectives (From admission, onward)   Start     Dose/Rate Route Frequency Ordered Stop   03/18/21 2200  azithromycin (ZITHROMAX) 500 mg in sodium chloride 0.9 % 250 mL IVPB        500 mg 250 mL/hr over 60 Minutes Intravenous Every 24 hours 03/18/21 0156 03/22/21 2159   03/18/21 0800  cefTRIAXone (ROCEPHIN) 2  g in sodium chloride 0.9 % 100 mL IVPB        2 g 200 mL/hr over 30 Minutes Intravenous Every 24 hours 03/18/21 0156 03/23/21 0759   03/17/21 2215  ceFEPIme (MAXIPIME) 2 g in sodium chloride 0.9 % 100 mL IVPB        2 g 200 mL/hr over 30 Minutes Intravenous  Once 03/17/21 2212 03/18/21 0123   03/17/21 2215  azithromycin (ZITHROMAX) 500 mg in sodium chloride 0.9 % 250 mL IVPB        500 mg 250 mL/hr over 60 Minutes Intravenous  Once 03/17/21 2212 03/18/21 0226      Assessment: Pharmacy consulted to start heparin for ACS. No note of DOAC PTA. Baseline labs ordered. Plt are low (63), continue to monitor. Chest Ct: Acute pulmonary emboli within multiple bilateral lobar and segmental branches. No CT evidence of acute right heart strain  Date Time HL Rate/Comment 4/14 INITIAL: 4,000 unit bolus. Followed by heparin infusion @ 800 units/hr 4/15 0747 0.47 therapautic x 1   Hbg: 8.8 > 8.1 Plt (:03/07/21 = 189, LMWH for DVT ppx) 4/14 =75> 63 > 4/15 = 49  4/15:Provider aware of expressed concern for HIT (4T score = 6) and low plt count  Goal of Therapy:  Heparin level 0.3-0.7 units/ml Monitor platelets by anticoagulation protocol: Yes   Plan:  Heparin therapeutic x 1  Continue heparin infusion at 800 units/hr Re-check confirmatory  anti-Xa level in 6 hours  Continue to monitor H&H and platelets Follow up with palliative care consult and medical decision to treat possible HIT/continue anticoagulation for PE   Dorothe Pea, PharmD, BCPS 03/18/2021,10:21 AM

## 2021-03-18 NOTE — Progress Notes (Signed)
[facility] PROGRESS NOTE   Brenda Middleton TWS:[id number] DOB: 11-11-1961 DOA: 03/17/2021  PCP: Remi Haggard, FNP  Brief History/Interval Summary: 60 y.o.  female with medical history significant for metastatic lung cancer, and anxiety and depression and hypertension, who was just admitted here on 4/12 for toxic encephalopathy with polypharmacy bibasal infiltrates and possible pneumonia, hypokalemia and hypomagnesemia and was discharged on 4/14 as she wanted to go home.  Unfortunately when she went home she started having abdominal pain with associated nausea and vomiting.  She was unable to keep any food or fluids down.    Upon assessment in the ED she was found to be tachycardic.  She was also noted to have an increased respiratory rate.  CT angiogram was done which was positive for pulmonary embolism.  She was placed on heparin.  She was hospitalized for further management.    Consultants: Palliative care  Procedures: None at this time  Antibiotics: Anti-infectives (From admission, onward)   Start     Dose/Rate Route Frequency Ordered Stop   03/18/21 2200  azithromycin (ZITHROMAX) 500 mg in sodium chloride 0.9 % 250 mL IVPB        500 mg 250 mL/hr over 60 Minutes Intravenous Every 24 hours 03/18/21 0156 03/22/21 2159   03/18/21 0800  cefTRIAXone (ROCEPHIN) 2 g in sodium chloride 0.9 % 100 mL IVPB        2 g 200 mL/hr over 30 Minutes Intravenous Every 24 hours 03/18/21 0156 03/23/21 0759   03/17/21 2215  ceFEPIme (MAXIPIME) 2 g in sodium chloride 0.9 % 100 mL IVPB        2 g 200 mL/hr over 30 Minutes Intravenous  Once 03/17/21 2212 03/18/21 0123   03/17/21 2215  azithromycin (ZITHROMAX) 500 mg in sodium chloride 0.9 % 250 mL IVPB        500 mg 250 mL/hr over 60 Minutes Intravenous  Once 03/17/21 2212 03/18/21 0226      Subjective/Interval History: Patient is somnolent but arousable.  Not very communicative.  Overnight events noted.  Patient was quite agitated  overnight pulling on her telemetry leads IV line.  Seems to be little bit more comfortable currently.  She denies any chest pain.  Denies any abdominal pain.    Assessment/Plan:  Acute bilateral pulmonary embolism in the setting of metastatic lung cancer Patient likely hypercoagulable due to her malignancy.  Patient was started on IV heparin.  He was to be stable from a respiratory standpoint.  She is currently on 4 L of oxygen by nasal cannula with saturations noted to be in the mid to late 90s.  Considering her other significant comorbidities and her poor prognosis will not pursue echocardiogram at this time.  Concern for multifocal pneumonia/acute respiratory failure with hypoxia Findings on the CT scan could also be due to her malignancy.  Empirically placed on IV antibiotics.  Noted to be on ceftriaxone and azithromycin.  Was elevated at 16.2.  She was afebrile.  Improved to 12.7 this morning.  Leukocytosis could also be due to steroids.  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  Once again due to her multiple other comorbidities and poor overall prognosis she is not a candidate for any aggressive interventions.  This was discussed with vascular surgery overnight.  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  Seen by medical oncology during last hospitalization.  Her prognosis  is thought to be poor.  Palliative care was involved during previous hospitalization and hospice was recommended however patient was very reluctant.  Perative care has been consulted again to assist.  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  Pain has been very difficult to control.  She also was admitted last time due to toxic encephalopathy and polypharmacy.  Will also request palliative care to assist with this as well.  Currently noted to be on fentanyl patch and on Dilaudid as  needed.  Brain metastases On dexamethasone which is being continued.  Elevated lipase level Etiology is not entirely clear.  Pancreas did not show any concerning findings on CT scan.  We will recheck it tomorrow.  Abdomen is benign.  History of depression and anxiety Noted to be on BuSpar, Seroquel.  Was also on clonazepam at home.  Currently very somnolent we will hold clonazepam.  Peripheral neuropathy On gabapentin.  Essential hypertension Blood pressure noted to be quite elevated which could be due to pain and discomfort.  Currently noted to be on metoprolol.  Steroids could also be contributing to elevated blood pressure.  We will decrease the dose of her metoprolol.  Use parenteral agents as needed.  Polypharmacy/recent toxic encephalopathy Seems to be quite sedated even now.  Likely due to medications as well as her malignancy.  Previous admission she was on fentanyl patch was initially discontinued but then resumed.  Being continued at this time.  Normocytic anemia/thrombocytopenia This is all secondary to malignancy as well as chemotherapy.  Consumption coagulopathy is also a possibility considering that she has a PE.  Monitor hemoglobin closely while she is on IV heparin.  Pressure injury/stage II decubitus Pressure Injury 03/15/21 Coccyx Mid Stage 2 -  Partial thickness loss of dermis presenting as a shallow open injury with a red, pink wound bed without slough. (Active)  03/15/21 1601  Location: Coccyx  Location Orientation: Mid  Staging: Stage 2 -  Partial thickness loss of dermis presenting as a shallow open injury with a red, pink wound bed without slough.  Wound Description (Comments):   Present on Admission: Yes    Goals of care Palliative care has been consulted to assist.  Patient with numerous health problems as discussed above most of which have a poor prognosis.  Ideally she would be candidate for hospice versus comfort care.  DVT Prophylaxis: On IV  heparin Code Status: DNR Family Communication: No family at bedside Disposition Plan: Unclear for now  Status is: Inpatient  Remains inpatient appropriate because:Ongoing active pain requiring inpatient pain management, Altered mental status and IV treatments appropriate due to intensity of illness or inability to take PO   Dispo: The patient is from: Home              Anticipated d/c is to: To be determined              Patient currently is not medically stable to d/c.   Difficult to place patient No      Medications:  Scheduled: . busPIRone  10 mg Oral BID  . dexamethasone  4 mg Oral BID  . fentaNYL  1 patch Transdermal Q72H  . folic acid  1 mg Oral Daily  . furosemide  20 mg Oral Daily  . gabapentin  300 mg Oral QHS  . guaiFENesin  1,200 mg Oral BID  . ipratropium-albuterol  3 mL Nebulization QID  . metoprolol tartrate  12.5 mg Oral BID  . pantoprazole  40 mg Oral Daily  . potassium chloride SA  20 mEq Oral Daily  . [START ON 03/19/2021] QUEtiapine  200 mg Oral QHS   Continuous: . sodium chloride 100 mL/hr at 03/18/21 0213  . azithromycin    . cefTRIAXone (ROCEPHIN)  IV Stopped (03/18/21 0853)  . heparin 800 Units/hr (03/18/21 0109)   ZOX:[id number] **OR** acetaminophen, albuterol, dextromethorphan-guaiFENesin, HYDROmorphone (DILAUDID) injection, ipratropium-albuterol, labetalol, lidocaine-prilocaine, LORazepam, magic mouthwash, magnesium hydroxide, morphine injection, ondansetron **OR** ondansetron (ZOFRAN) IV, oxyCODONE, oxyCODONE-acetaminophen, promethazine, traZODone   Objective:  Vital Signs  Vitals:   03/18/21 0600 03/18/21 0630 03/18/21 0700 03/18/21 0730  BP: (!) 162/107 (!) 145/95 (!) 165/93 (!) 165/95  Pulse: (!) 127 (!) 118 (!) 119 (!) 118  Resp: (!) 21   16  Temp:      TempSrc:      SpO2: 98% 99% 100% 100%  Weight:      Height:        Intake/Output Summary (Last 24 hours) at 03/18/2021 0848 Last data filed at 03/18/2021 0123 Gross per 24  hour  Intake 100.83 ml  Output --  Net 100.83 ml   Filed Weights   03/17/21 2045  Weight: 50.7 kg    General appearance: Somnolent.  Arousable.  No distress. Resp: Noted to be tachypneic.  Poor air entry at the bases with crackles bilaterally.  No wheezing or rhonchi. Cardio: S1-S2 is tachycardic regular.  No S3-S4.  No rubs murmurs or bruit GI: Abdomen is soft.  Nontender nondistended.  Bowel sounds are present normal.  No masses organomegaly Extremities: Noted to be moving all of her extremities Neurologic: No obvious focal neurological deficits.   Lab Results:  Data Reviewed: I have personally reviewed following labs and imaging studies  CBC: Recent Labs  Lab 03/14/21 0817 03/15/21 1109 03/16/21 0314 03/17/21 0321 03/17/21 2051 03/18/21 0447  WBC 27.6* 18.1* 16.5* 15.8* 16.2* 12.7*  NEUTROABS 24.3* 16.9*  --   --  14.6*  --   HGB 9.7* 8.5* 7.6* 8.2* 8.8* 8.1*  HCT 30.3* 25.9* 23.6* 24.8* 26.6* 25.2*  MCV 87.6 84.6 84.3 83.5 83.6 84.3  PLT 122* 97* 99* 75* 63* 49*    Basic Metabolic Panel: Recent Labs  Lab 03/14/21 0817 03/15/21 1109 03/16/21 0314 03/17/21 0321 03/17/21 2051 03/18/21 0447  NA 139 140 140 139 135 136  K 2.6* 3.6 4.0 4.2 4.0 3.5  CL 100 106 109 108 103 105  CO2 26 23 24 23 22 22   GLUCOSE 127* 137* 143* 157* 82 80  BUN 30* 30* 26* 24* 21* 17  CREATININE 1.02* 0.86 0.72 0.61 0.70 0.54  CALCIUM 8.4* 8.2* 7.7* 7.8* 7.9* 7.5*  MG 1.7  --   --   --   --   --     GFR: Estimated Creatinine Clearance: 59.9 mL/min (by C-G formula based on SCr of 0.54 mg/dL).  Liver Function Tests: Recent Labs  Lab 03/14/21 0817 03/15/21 1109 03/17/21 2051  AST 30 27 89*  ALT 30 27 52*  ALKPHOS 331* 278* 295*  BILITOT 0.5 0.4 1.0  PROT 7.0 6.1* 6.0*  ALBUMIN 3.3* 2.6* 2.3*    Recent Labs  Lab 03/15/21 1109 03/17/21 2051  LIPASE 50 533*   Recent Labs  Lab 03/15/21 1109  AMMONIA 23    Coagulation Profile: Recent Labs  Lab 03/16/21 0314  03/17/21 2234  INR 1.2 1.2     Recent Results (from the past 240 hour(s))  Culture, blood (routine x 2)  Status: Abnormal   Collection Time: 03/15/21 11:09 AM   Specimen: BLOOD  Result Value Ref Range Status   Specimen Description   Final    BLOOD RIGHT HAND Performed at [facility], [address] [place], [place] [address]    Special Requests   Final    BOTTLES DRAWN AEROBIC AND ANAEROBIC Blood Culture results may not be optimal due to an inadequate volume of blood received in culture bottles Performed at [facility], [address] [place], [place] [address]    Culture  Setup Time   Final    Organism ID to follow [place] CRITICAL RESULT CALLED TO, READ BACK BY AND VERIFIED WITH: ABBY ELLINGTON AT 1103 03/16/21 SDR Performed at [facility], [address] [place],  [address]    Culture (A)  Final    STAPHYLOCOCCUS EPIDERMIDIS THE SIGNIFICANCE OF ISOLATING THIS ORGANISM FROM A SINGLE SET OF BLOOD CULTURES WHEN MULTIPLE SETS ARE DRAWN IS UNCERTAIN. PLEASE NOTIFY THE MICROBIOLOGY DEPARTMENT WITHIN ONE WEEK IF SPECIATION AND SENSITIVITIES ARE REQUIRED. Performed at [facility], [place] [address], [place],  [address]    Report Status 03/17/2021 FINAL  Final  Resp Panel by RT-PCR (Flu A&B, Covid) Nasopharyngeal Swab     Status: None   Collection Time: 03/15/21 11:09 AM   Specimen: Nasopharyngeal Swab; Nasopharyngeal(NP) swabs in vial transport medium  Result Value Ref Range Status   SARS Coronavirus 2 by RT PCR NEGATIVE NEGATIVE Final    Comment: (NOTE) SARS-CoV-2 target nucleic acids are NOT DETECTED.  The SARS-CoV-2 RNA is generally detectable in upper respiratory specimens during the acute phase of infection. The lowest concentration of SARS-CoV-2 viral copies this assay can detect is 138 copies/mL. A negative result does not preclude SARS-Cov-2 infection and should not be used as the  sole basis for treatment or other patient management decisions. A negative result may occur with  improper specimen collection/handling, submission of specimen other than nasopharyngeal swab, presence of viral mutation(s) within the areas targeted by this assay, and inadequate number of viral copies(<138 copies/mL). A negative result must be combined with clinical observations, patient history, and epidemiological information. The expected result is Negative.  Fact Sheet for Patients:  EntrepreneurPulse.com.au  Fact Sheet for Healthcare Providers:  IncredibleEmployment.be  This test is no t yet approved or cleared by the [place] FDA and  has been authorized for detection and/or diagnosis of SARS-CoV-2 by FDA under an Emergency Use Authorization (EUA). This EUA will remain  in effect (meaning this test can be used) for the duration of the COVID-19 declaration under Section 564(b)(1) of the Act, 21 U.S.C.section 360bbb-3(b)(1), unless the authorization is terminated  or revoked sooner.       Influenza A by PCR NEGATIVE NEGATIVE Final   Influenza B by PCR NEGATIVE NEGATIVE Final    Comment: (NOTE) The Xpert Xpress SARS-CoV-2/FLU/RSV plus assay is intended as an aid in the diagnosis of influenza from Nasopharyngeal swab specimens and should not be used as a sole basis for treatment. Nasal washings and aspirates are unacceptable for Xpert Xpress SARS-CoV-2/FLU/RSV testing.  Fact Sheet for Patients: EntrepreneurPulse.com.au  Fact Sheet for Healthcare Providers: IncredibleEmployment.be  This test is not yet approved or cleared by the [place] FDA and has been authorized for detection and/or diagnosis of SARS-CoV-2 by FDA under an Emergency Use Authorization (EUA). This EUA will remain in effect (meaning this test can be used) for the duration of the  COVID-19 declaration under Section 564(b)(1) of the  Act, 21 U.S.C. section 360bbb-3(b)(1), unless the authorization is terminated or revoked.  Performed at [facility], [address] [place], [place] [address]   Blood Culture ID Panel (Reflexed)     Status: Abnormal   Collection Time: 03/15/21 11:09 AM  Result Value Ref Range Status   Enterococcus faecalis NOT DETECTED NOT DETECTED Final   Enterococcus Faecium NOT DETECTED NOT DETECTED Final   Listeria monocytogenes NOT DETECTED NOT DETECTED Final   Staphylococcus species DETECTED (A) NOT DETECTED Final    Comment: CRITICAL RESULT CALLED TO, READ BACK BY AND VERIFIED WITH:  ABBY ELLINGTON AT 1103 03/16/21 SDR    Staphylococcus aureus (BCID) NOT DETECTED NOT DETECTED Final   Staphylococcus epidermidis DETECTED (A) NOT DETECTED Final    Comment: Methicillin (oxacillin) resistant coagulase negative staphylococcus. Possible blood culture contaminant (unless isolated from more than one blood culture draw or clinical case suggests pathogenicity). No antibiotic treatment is indicated for blood  culture contaminants. CRITICAL RESULT CALLED TO, READ BACK BY AND VERIFIED WITH:  ABBY ELLINGTON AT 5643 03/16/21 SDR    Staphylococcus lugdunensis NOT DETECTED NOT DETECTED Final   Streptococcus species NOT DETECTED NOT DETECTED Final   Streptococcus agalactiae NOT DETECTED NOT DETECTED Final   Streptococcus pneumoniae NOT DETECTED NOT DETECTED Final   Streptococcus pyogenes NOT DETECTED NOT DETECTED Final   A.calcoaceticus-baumannii NOT DETECTED NOT DETECTED Final   Bacteroides fragilis NOT DETECTED NOT DETECTED Final   Enterobacterales NOT DETECTED NOT DETECTED Final   Enterobacter cloacae complex NOT DETECTED NOT DETECTED Final   Escherichia coli NOT DETECTED NOT DETECTED Final   Klebsiella aerogenes NOT DETECTED NOT DETECTED Final   Klebsiella oxytoca NOT DETECTED NOT DETECTED Final   Klebsiella pneumoniae NOT DETECTED NOT DETECTED Final   Proteus species NOT DETECTED NOT DETECTED  Final   Salmonella species NOT DETECTED NOT DETECTED Final   Serratia marcescens NOT DETECTED NOT DETECTED Final   Haemophilus influenzae NOT DETECTED NOT DETECTED Final   Neisseria meningitidis NOT DETECTED NOT DETECTED Final   Pseudomonas aeruginosa NOT DETECTED NOT DETECTED Final   Stenotrophomonas maltophilia NOT DETECTED NOT DETECTED Final   Candida albicans NOT DETECTED NOT DETECTED Final   Candida auris NOT DETECTED NOT DETECTED Final   Candida glabrata NOT DETECTED NOT DETECTED Final   Candida krusei NOT DETECTED NOT DETECTED Final   Candida parapsilosis NOT DETECTED NOT DETECTED Final   Candida tropicalis NOT DETECTED NOT DETECTED Final   Cryptococcus neoformans/gattii NOT DETECTED NOT DETECTED Final   Methicillin resistance mecA/C DETECTED (A) NOT DETECTED Final    Comment: CRITICAL RESULT CALLED TO, READ BACK BY AND VERIFIED WITH:  ABBY ELLINGTON AT 1103 03/16/21 SDR Performed at [facility], [address] [place], [place] [address]   Culture, blood (routine x 2)     Status: None (Preliminary result)   Collection Time: 03/15/21 12:10 PM   Specimen: BLOOD  Result Value Ref Range Status   Specimen Description BLOOD RIGHT [facility]  Final   Special Requests   Final    BOTTLES DRAWN AEROBIC AND ANAEROBIC Blood Culture adequate volume   Culture   Final    NO GROWTH 3 DAYS Performed at [facility], [address] [place], [place] [address]    Report Status PENDING  Incomplete  MRSA PCR Screening     Status: None   Collection Time: 03/15/21  2:30 PM   Specimen: Nasal Mucosa; Nasopharyngeal  Result Value Ref Range  Status   MRSA by PCR NEGATIVE NEGATIVE Final    Comment:        The GeneXpert MRSA Assay (FDA approved for NASAL specimens only), is one component of a comprehensive MRSA colonization surveillance program. It is not intended to diagnose MRSA infection nor to guide or monitor treatment for MRSA infections. Performed at [facility],  [address] [place], [place] [address]       Radiology Studies: CT Angio Chest PE W/Cm &/Or Wo Cm  Addendum Date: 03/17/2021   ADDENDUM REPORT: 03/17/2021 23:51 ADDENDUM: Critical Value/emergent results were called by telephone at the time of interpretation on 03/17/2021 at 11:46 pm to provider [facility] , who verbally acknowledged these results. Electronically Signed   By: Ulyses Jarred M.D.   On: 03/17/2021 23:51   Result Date: 03/17/2021 CLINICAL DATA:  Stage IV metastatic lung carcinoma. Discharged from ICU today. Lower abdominal pain after enema. EXAM: CT ANGIOGRAPHY CHEST CT ABDOMEN AND PELVIS WITH CONTRAST TECHNIQUE: Multidetector CT imaging of the chest was performed using the standard protocol during bolus administration of intravenous contrast. Multiplanar CT image reconstructions and MIPs were obtained to evaluate the vascular anatomy. Multidetector CT imaging of the abdomen and pelvis was performed using the standard protocol during bolus administration of intravenous contrast. CONTRAST:  46mL OMNIPAQUE IOHEXOL 350 MG/ML SOLN COMPARISON:  None. FINDINGS: CTA CHEST FINDINGS Cardiovascular: Contrast injection is sufficient to demonstrate satisfactory opacification of the pulmonary arteries to the segmental level.There are filling defects within the proximal right middle lobar artery and within the posterior segmental branches of the right lower lobe. On the left, there are filling defects within the left lower lobe segmental branches. The main pulmonary artery is within normal limits for size. There is no CT evidence of acute right heart strain. Mild calcific aortic atherosclerosis. Aorta is otherwise normal. There is a normal 3-vessel arch branching pattern. Heart size is normal, without pericardial effusion. Mediastinum/Nodes: Unchanged size of right hilar mass measuring 4.3 x 3.1 cm. Left hilar nodes are unchanged. Confluent pretracheal adenopathy also unchanged. Lungs/Pleura:  Widespread ground-glass opacities are new since the prior study. Right middle lobe nodule (image 48) has decreased in size measuring 2.0 x 1.7 cm, previously 2.6 x 2.7 cm. There are small bilateral pleural effusions. 4 mm nodule in the left upper lobe is unchanged (image 42). Musculoskeletal: Sclerotic focus in the right aspect of the T5 vertebral body has markedly increased in size. Lytic lesion at T9 is unchanged. T7 lucent lesion is also unchanged. Review of the MIP images confirms the above findings. CT ABDOMEN and PELVIS FINDINGS Hepatobiliary: Unchanged hypodensity in the left hepatic lobe (2:12 measuring 12 mm. There is faint peripheral enhancement. There is also a small area of hyperenhancement more laterally at the same level (see aero). This is at the site of the hypodense focus seen on the previous CT. Subcapsular hypodensity with peripheral enhancement is unchanged (2:20). Status post cholecystectomy. Pancreas: Normal contours without ductal dilatation. No peripancreatic fluid collection. Spleen: Normal. Adrenals/Urinary Tract: --Adrenal glands: Normal. 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 --Left kidney/ureter: Proximal left ureter is dilated. There is focal hypoattenuation at the lower pole of the left kidney with suspected tumor thrombus in the lower branches of the left renal artery. --Urinary bladder: Dilated Stomach/Bowel: --Stomach/Duodenum: No hiatal hernia or other gastric abnormality. Normal duodenal course and caliber. --Small bowel: No dilatation or inflammation. --Colon: No focal abnormality. --Appendix:  Normal. 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 There is calcific atherosclerosis of the aorta. The portal vein, splenic vein and superior mesenteric vein are patent. The IVC is patent. There is a 12 mm retrocaval lymph node, unchanged. Multiple  subcentimeter retroperitoneal nodes. Reproductive: There is a small amount of free fluid in the pelvis. Normal uterus. No adnexal mass. Musculoskeletal. Mixed density appearance of L2 is unchanged. Small sclerotic focus within the S1 body has slightly increased in size. Other: None. IMPRESSION: 1. Acute pulmonary emboli within multiple bilateral lobar and segmental branches. No CT evidence of acute right heart strain. 2. Widespread ground-glass opacities, new since the prior study. This could indicate infection or post treatment change. Lymphangitic tumor spread is also possible. New small bilateral pleural effusions. 3. Unchanged size of right hilar mass and mediastinal lymphadenopathy. Right middle lobe dominant nodule has decreased in size. 4. 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 Unchanged appearance of liver metastases allowing for differences in contrast timing. 6. Multiple osseous metastases, worsened clearly at T5. Aortic Atherosclerosis (ICD10-I70.0). Electronically Signed: By: Ulyses Jarred M.D. On: 03/17/2021 23:39   CT Abdomen Pelvis W Contrast  Addendum Date: 03/17/2021   ADDENDUM REPORT: 03/17/2021 23:51 ADDENDUM: Critical Value/emergent results were called by telephone at the time of interpretation on 03/17/2021 at 11:46 pm to provider [facility] , who verbally acknowledged these results. Electronically Signed   By: Ulyses Jarred M.D.   On: 03/17/2021 23:51   Result Date: 03/17/2021 CLINICAL DATA:  Stage IV metastatic lung carcinoma. Discharged from ICU today. Lower abdominal pain after enema. EXAM: CT ANGIOGRAPHY CHEST CT ABDOMEN AND PELVIS WITH CONTRAST TECHNIQUE: Multidetector CT imaging of the chest was performed using the standard protocol during bolus administration of intravenous contrast. Multiplanar CT image reconstructions and MIPs were obtained to evaluate the vascular anatomy. Multidetector CT imaging of the abdomen and  pelvis was performed using the standard protocol during bolus administration of intravenous contrast. CONTRAST:  21mL OMNIPAQUE IOHEXOL 350 MG/ML SOLN COMPARISON:  None. FINDINGS: CTA CHEST FINDINGS Cardiovascular: Contrast injection is sufficient to demonstrate satisfactory opacification of the pulmonary arteries to the segmental level.There are filling defects within the proximal right middle lobar artery and within the posterior segmental branches of the right lower lobe. On the left, there are filling defects within the left lower lobe segmental branches. The main pulmonary artery is within normal limits for size. There is no CT evidence of acute right heart strain. Mild calcific aortic atherosclerosis. Aorta is otherwise normal. There is a normal 3-vessel arch branching pattern. Heart size is normal, without pericardial effusion. Mediastinum/Nodes: Unchanged size of right hilar mass measuring 4.3 x 3.1 cm. Left hilar nodes are unchanged. Confluent pretracheal adenopathy also unchanged. Lungs/Pleura: Widespread ground-glass opacities are new since the prior study. Right middle lobe nodule (image 48) has decreased in size measuring 2.0 x 1.7 cm, previously 2.6 x 2.7 cm. There are small bilateral pleural effusions. 4 mm nodule in the left upper lobe is unchanged (image 42). Musculoskeletal: Sclerotic focus in the right aspect of the T5 vertebral body has markedly increased in size. Lytic lesion at T9 is unchanged. T7 lucent lesion is also unchanged. Review of the MIP images confirms the above findings. CT ABDOMEN and PELVIS FINDINGS Hepatobiliary: Unchanged hypodensity in the left hepatic lobe (2:12 measuring 12 mm. There is faint peripheral enhancement. There is also a small area of hyperenhancement more laterally at the same level (see aero).  This is at the site of the hypodense focus seen on the previous CT. Subcapsular hypodensity with peripheral enhancement is unchanged (2:20). Status post cholecystectomy.  Pancreas: Normal contours without ductal dilatation. No peripancreatic fluid collection. Spleen: Normal. Adrenals/Urinary Tract: --Adrenal glands: Normal. 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 --Left kidney/ureter: Proximal left ureter is dilated. There is focal hypoattenuation at the lower pole of the left kidney with suspected tumor thrombus in the lower branches of the left renal artery. --Urinary bladder: Dilated Stomach/Bowel: --Stomach/Duodenum: No hiatal hernia or other gastric abnormality. Normal duodenal course and caliber. --Small bowel: No dilatation or inflammation. --Colon: No focal abnormality. --Appendix: Normal. 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 There is calcific atherosclerosis of the aorta. The portal vein, splenic vein and superior mesenteric vein are patent. The IVC is patent. There is a 12 mm retrocaval lymph node, unchanged. Multiple subcentimeter retroperitoneal nodes. Reproductive: There is a small amount of free fluid in the pelvis. Normal uterus. No adnexal mass. Musculoskeletal. Mixed density appearance of L2 is unchanged. Small sclerotic focus within the S1 body has slightly increased in size. Other: None. IMPRESSION: 1. Acute pulmonary emboli within multiple bilateral lobar and segmental branches. No CT evidence of acute right heart strain. 2. Widespread ground-glass opacities, new since the prior study. This could indicate infection or post treatment change. Lymphangitic tumor spread is also possible. New small bilateral pleural effusions. 3. Unchanged size of right hilar mass and mediastinal lymphadenopathy. Right middle lobe dominant nodule has decreased in size. 4. 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 Unchanged appearance of liver metastases allowing for differences in  contrast timing. 6. Multiple osseous metastases, worsened clearly at T5. Aortic Atherosclerosis (ICD10-I70.0). Electronically Signed: By: Ulyses Jarred M.D. On: 03/17/2021 23:39   DG Chest Portable 1 View  Result Date: 03/17/2021 CLINICAL DATA:  Shortness of breath EXAM: PORTABLE CHEST 1 VIEW COMPARISON:  03/15/2021 FINDINGS: Worsening bilateral opacities throughout both lungs. No pneumothorax or sizable pleural effusion. Normal cardiomediastinal contours. IMPRESSION: Worsening bilateral airspace opacities. Electronically Signed   By: Ulyses Jarred M.D.   On: 03/17/2021 21:23       LOS: 0 days   [facility] Pager on [email or web address]  03/18/2021, 8:48 AM

## 2021-03-18 NOTE — ED Notes (Signed)
Pt asleep. Family at bedside states will be leaving soon. Pt's room monitor switched to comfort care.

## 2021-03-18 NOTE — ED Notes (Signed)
Patient pulling at IV lines, refusing to leave cardiac monitor on, trying to get out of bed. Anxious/agitated. Attempted to redirect patient for safety at this time, unsuccessful. Patient repositioned in bed. Pt reports, "Just let me go home, I'm hurting, let me just die in peace, I can't take the pain anymore". Patient rating pain a 10/10 at this time. Sitter at bedside for safety. See MAR for medications given.

## 2021-03-18 NOTE — ED Notes (Signed)
Patient continues to try to get out of bed despite multiple attempts to redirect patient by this RN and charge RN Raquel. Patient refusing to leave cardiac leads, oxygen, and pulse oximeter on. SPO2 drops to mid 80s on RA. Anxious/agitated. States "I need to get up, I need to pee, I can't lay here". Prior to this event patient complaining of weakness,dizziness, and lower back pain.

## 2021-03-18 NOTE — Consult Note (Signed)
[place] for Heparin Indication: pulmonary embolus  Allergies  Allergen Reactions  . Penicillin G Hives    Other reaction(s): HIVES Other reaction(s): HIVES  . Penicillin V Itching  . Penicillins     Patient Measurements: Height: 5\' 6"  (167.6 cm) Weight: 50.7 kg (111 lb 12.4 oz) IBW/kg (Calculated) : 59.3 Heparin Dosing Weight: 50.7 kg  Vital Signs: BP: 166/91 (04/15 1500) Pulse Rate: 116 (04/15 1500)  Labs: Recent Labs    03/16/21 0314 03/17/21 0321 03/17/21 2051 03/17/21 2234 03/18/21 0056 03/18/21 0447 03/18/21 0747 03/18/21 1521  HGB 7.6* 8.2* 8.8*  --   --  8.1*  --   --   HCT 23.6* 24.8* 26.6*  --   --  25.2*  --   --   PLT 99* 75* 63*  --   --  49*  --   --   APTT  --   --   --  29 28  --   --   --   LABPROT 14.7  --   --  15.2  --   --   --   --   INR 1.2  --   --  1.2  --   --   --   --   HEPARINUNFRC  --   --   --   --   --   --  0.47 0.20*  CREATININE 0.72 0.61 0.70  --   --  0.54  --   --   TROPONINIHS  --   --  2,813* 3,488*  --   --   --   --     Estimated Creatinine Clearance: 59.9 mL/min (by C-G formula based on SCr of 0.54 mg/dL).   Medical History: Past Medical History:  Diagnosis Date  . Anxiety   . Benign neoplasm of cervix uteri   . Chronic hepatitis C without mention of hepatic coma   . Chronic low back pain 08/26/2014  . Constipation   . Depressive disorder   . Displacement of cervical intervertebral disc   . Hypertensive disorder   . Iron deficiency anemia due to chronic blood loss 09/12/2019  . Palpitations   . Prolapsed cervical intervertebral disc     Medications:  (Not in a hospital admission)  Scheduled:  . busPIRone  10 mg Oral BID  . dexamethasone  4 mg Oral BID  . fentaNYL  1 patch Transdermal Q72H  . folic acid  1 mg Oral Daily  . furosemide  20 mg Oral Daily  . gabapentin  300 mg Oral QHS  . guaiFENesin  1,200 mg Oral BID  . ipratropium-albuterol  3 mL Nebulization QID   . metoprolol tartrate  50 mg Oral BID  . pantoprazole  40 mg Oral Daily  . potassium chloride SA  20 mEq Oral Daily  . [START ON 03/19/2021] QUEtiapine  200 mg Oral QHS   Infusions:  . sodium chloride 100 mL/hr at 03/18/21 1205  . azithromycin    . cefTRIAXone (ROCEPHIN)  IV Stopped (03/18/21 0853)  . heparin 800 Units/hr (03/18/21 0109)   PRN:  Anti-infectives (From admission, onward)   Start     Dose/Rate Route Frequency Ordered Stop   03/18/21 2200  azithromycin (ZITHROMAX) 500 mg in sodium chloride 0.9 % 250 mL IVPB        500 mg 250 mL/hr over 60 Minutes Intravenous Every 24 hours 03/18/21 0156 03/22/21 2159   03/18/21 0800  cefTRIAXone (ROCEPHIN) 2  g in sodium chloride 0.9 % 100 mL IVPB        2 g 200 mL/hr over 30 Minutes Intravenous Every 24 hours 03/18/21 0156 03/23/21 0759   03/17/21 2215  ceFEPIme (MAXIPIME) 2 g in sodium chloride 0.9 % 100 mL IVPB        2 g 200 mL/hr over 30 Minutes Intravenous  Once 03/17/21 2212 03/18/21 0123   03/17/21 2215  azithromycin (ZITHROMAX) 500 mg in sodium chloride 0.9 % 250 mL IVPB        500 mg 250 mL/hr over 60 Minutes Intravenous  Once 03/17/21 2212 03/18/21 0226      Assessment: Pharmacy consulted to start heparin for ACS. No note of DOAC PTA. Baseline labs ordered. Plt are low (63), continue to monitor. Chest Ct: Acute pulmonary emboli within multiple bilateral lobar and segmental branches. No CT evidence of acute right heart strain  Date Time HL Rate/Comment 4/14 INITIAL: 4,000 unit bolus. Followed by heparin infusion @ 800 units/hr 4/15 0747 0.47 therapautic x 1  4/15 1521 0.20 Subtherapeutic --infusion stopped for unknown period of time  Hbg: 8.8 > 8.1 Plt (:03/07/21 = 189, LMWH for DVT ppx) 4/14 =75> 63 > 4/15 = 49  4/15:Provider aware of expressed concern for HIT and low plt count  Goal of Therapy:  Heparin level 0.3-0.7 units/ml Monitor platelets by anticoagulation protocol: Yes   Plan:  Heparin level subtherapeutic.  Per note (but not documented on [facility]) patient pulled out heparin IV line, unsure how long infusion was stopped or when restarted Will hold off increasing infusion at this time and collect another HL in 6 hours  Continue heparin infusion at 800 units/hr Re-check anti-Xa level in 6 hours  Continue to monitor H&H and platelets   Dorothe Pea, PharmD, BCPS 03/18/2021,4:20 PM

## 2021-03-18 NOTE — ED Notes (Signed)
Heparin gtt running at 800units/hr; 11mL/hr. Lights dimmed for pt. Family at bedside. Bed locked low. Rails up. Call bell within reach. Pt sleeping. Pure wick in use; low continuous suction. [place] hooked back up to 2L O2.

## 2021-03-18 NOTE — ED Notes (Signed)
Pt sleeping at this time, will administer duoneb when pt wakes up

## 2021-03-18 NOTE — ED Notes (Signed)
Per MD, pain control/

## 2021-03-18 NOTE — ED Notes (Signed)
Family member at bedside notified pt has bed assignment and will switch over soon.

## 2021-03-18 NOTE — ED Notes (Addendum)
Merton Border MD to request downgrade to med-surg. Awaiting response.

## 2021-03-19 NOTE — Progress Notes (Addendum)
[facility] PROGRESS NOTE   Brenda Middleton HGD:[id number] DOB: 03/25/1961 DOA: 03/17/2021  PCP: Remi Haggard, FNP  Brief History/Interval Summary: 60 y.o.  female with medical history significant for metastatic lung cancer, and anxiety and depression and hypertension, who was just admitted here on 4/12 for toxic encephalopathy with polypharmacy bibasal infiltrates and possible pneumonia, hypokalemia and hypomagnesemia and was discharged on 4/14 as she wanted to go home.  Unfortunately when she went home she started having abdominal pain with associated nausea and vomiting.  She was unable to keep any food or fluids down.    Upon assessment in the ED she was found to be tachycardic.  She was also noted to have an increased respiratory rate.  CT angiogram was done which was positive for pulmonary embolism.  She was placed on heparin.  She was hospitalized for further management.    Consultants:  Palliative care.   Medical oncology  Procedures: None at this time  Antibiotics: Anti-infectives (From admission, onward)   Start     Dose/Rate Route Frequency Ordered Stop   03/18/21 2200  azithromycin (ZITHROMAX) 500 mg in sodium chloride 0.9 % 250 mL IVPB  Status:  Discontinued        500 mg 250 mL/hr over 60 Minutes Intravenous Every 24 hours 03/18/21 0156 03/18/21 1754   03/18/21 0800  cefTRIAXone (ROCEPHIN) 2 g in sodium chloride 0.9 % 100 mL IVPB  Status:  Discontinued        2 g 200 mL/hr over 30 Minutes Intravenous Every 24 hours 03/18/21 0156 03/18/21 1754   03/17/21 2215  ceFEPIme (MAXIPIME) 2 g in sodium chloride 0.9 % 100 mL IVPB        2 g 200 mL/hr over 30 Minutes Intravenous  Once 03/17/21 2212 03/18/21 0123   03/17/21 2215  azithromycin (ZITHROMAX) 500 mg in sodium chloride 0.9 % 250 mL IVPB        500 mg 250 mL/hr over 60 Minutes Intravenous  Once 03/17/21 2212 03/18/21 0226      Subjective/Interval History: Patient noted to be somnolent this morning.  Opens  her eyes occasionally but does not really communicate.  Patient's sister is at the bedside.     Assessment/Plan:  Goals of care/end-of-life care Patient with stage IV lung cancer with diffuse metastases the skeletal system as well as brain.  Seen by medical oncology.  Underwent 4 cycles which she did not tolerate well.  Prognosis per oncology is extremely poor.  Patient was hospitalized last week and hospice was recommended which the patient declined.  She went home.  Came back within a few hours due to uncontrolled pain.  Subsequently found to have acute pulmonary embolism which is likely due to her hypercoagulable state from her malignancy.  Discussions held with the patient's sister.  Patient was unable to communicate as she remained obtunded. Considering poor prognosis and after discussion with other family members, decision was made to transition her to comfort care.  Acute bilateral pulmonary embolism in the setting of metastatic lung cancer Patient likely hypercoagulable due to her malignancy.  Patient was initially started on IV heparin.  Platelet counts were noted to be decreasing.  Subsequently discussions were held as mentioned above.  No comfort care.  Heparin was discontinued.    Concern for multifocal pneumonia/acute respiratory failure with hypoxia Findings on the CT scan could also be due to her malignancy.  She was empirically started on IV antibiotics which was subsequently discontinued.  Leukocytosis could  also be due to steroids.  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  Once again due to her multiple other comorbidities and poor overall prognosis she is not a candidate for any aggressive interventions.   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  Seen by medical oncology and her prognosis is thought to be extremely poor.  Steroids were also  discontinued.  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  Her pain was very difficult to control.  We are continuing her fentanyl patch and she is on multiple as needed medications.  Seems to be very comfortable this morning.  Continue to monitor for now.    Elevated lipase level Etiology is not entirely clear.  Pancreas did not show any concerning findings on CT scan.    History of depression and anxiety Continue Seroquel.  Noted to be on Ativan as well.    Peripheral neuropathy On gabapentin.  Essential hypertension Elevated blood pressure most likely due to poorly controlled pain.  Seems to be better this morning.  Continue to monitor.    Polypharmacy/recent toxic encephalopathy Likely due to medications as well as her malignancy.  Now comfort care.  Normocytic anemia/thrombocytopenia This is all secondary to malignancy as well as chemotherapy.  Consumption coagulopathy is also a possibility considering that she has a PE.    Pressure injury/stage II decubitus Pressure Injury 03/15/21 Coccyx Mid Stage 2 -  Partial thickness loss of dermis presenting as a shallow open injury with a red, pink wound bed without slough. (Active)  03/15/21 1601  Location: Coccyx  Location Orientation: Mid  Staging: Stage 2 -  Partial thickness loss of dermis presenting as a shallow open injury with a red, pink wound bed without slough.  Wound Description (Comments):   Present on Admission: Yes      DVT Prophylaxis: Comfort care Code Status: DNR Family Communication: Sister is at the bedside Disposition Plan: May need to pursue residential hospice depending on how she does over the next 24 to 48 hours.  Status is: Inpatient  Remains inpatient appropriate because:Ongoing active pain requiring inpatient pain management, Altered mental status and IV treatments appropriate due to intensity of illness or inability to take PO   Dispo: The patient is from: Home               Anticipated d/c is to: To be determined              Patient currently is not medically stable to d/c.   Difficult to place patient No      Medications:  Scheduled: . fentaNYL  1 patch Transdermal Q72H  . gabapentin  300 mg Oral QHS  . QUEtiapine  200 mg Oral QHS   Continuous:  NFA:OZHYQMVHQIONG **OR** acetaminophen, antiseptic oral rinse, diphenhydrAMINE, glycopyrrolate **OR** glycopyrrolate **OR** glycopyrrolate, haloperidol **OR** haloperidol **OR** haloperidol lactate, HYDROmorphone (DILAUDID) injection, lidocaine-prilocaine, LORazepam **OR** LORazepam **OR** LORazepam, morphine injection, ondansetron **OR** ondansetron (ZOFRAN) IV, oxyCODONE, oxyCODONE-acetaminophen, polyvinyl alcohol   Objective:  Vital Signs  Vitals:   03/18/21 1730 03/18/21 1758 03/18/21 1919 03/19/21 0815  BP: (!) 163/106  (!) 168/104 (!) 140/97  Pulse: (!) 114 (!) 116 (!) 115 (!) 119  Resp: 15 15 16 16   Temp:   97.7 F (36.5 C)   TempSrc:      SpO2: 98% 100% 93% 90%  Weight:   49.8 kg  Height:        Intake/Output Summary (Last 24 hours) at 03/19/2021 0847 Last data filed at 03/19/2021 0708 Gross per 24 hour  Intake 90 ml  Output 700 ml  Net -610 ml   Filed Weights   03/17/21 2045 03/18/21 1919  Weight: 50.7 kg 49.8 kg    General appearance: Somnolent.  Occasionally opens her eyes. Resp: Mildly tachypneic.  Coarse breath sounds bilaterally with few crackles at the bases. Cardio: S1-S2 is tachycardic regular.  No S3-S4.  No rubs murmurs or bruit GI: Abdomen is soft.  Nontender nondistended.  Bowel sounds are present normal.  No masses organomegaly   Lab Results:  Data Reviewed: I have personally reviewed following labs and imaging studies  CBC: Recent Labs  Lab 03/14/21 0817 03/15/21 1109 03/16/21 0314 03/17/21 0321 03/17/21 2051 03/18/21 0447  WBC 27.6* 18.1* 16.5* 15.8* 16.2* 12.7*  NEUTROABS 24.3* 16.9*  --   --  14.6*  --   HGB 9.7* 8.5* 7.6* 8.2* 8.8* 8.1*  HCT 30.3*  25.9* 23.6* 24.8* 26.6* 25.2*  MCV 87.6 84.6 84.3 83.5 83.6 84.3  PLT 122* 97* 99* 75* 63* 49*    Basic Metabolic Panel: Recent Labs  Lab 03/14/21 0817 03/15/21 1109 03/16/21 0314 03/17/21 0321 03/17/21 2051 03/18/21 0447  NA 139 140 140 139 135 136  K 2.6* 3.6 4.0 4.2 4.0 3.5  CL 100 106 109 108 103 105  CO2 26 23 24 23 22 22   GLUCOSE 127* 137* 143* 157* 82 80  BUN 30* 30* 26* 24* 21* 17  CREATININE 1.02* 0.86 0.72 0.61 0.70 0.54  CALCIUM 8.4* 8.2* 7.7* 7.8* 7.9* 7.5*  MG 1.7  --   --   --   --   --     GFR: Estimated Creatinine Clearance: 58.8 mL/min (by C-G formula based on SCr of 0.54 mg/dL).  Liver Function Tests: Recent Labs  Lab 03/14/21 0817 03/15/21 1109 03/17/21 2051  AST 30 27 89*  ALT 30 27 52*  ALKPHOS 331* 278* 295*  BILITOT 0.5 0.4 1.0  PROT 7.0 6.1* 6.0*  ALBUMIN 3.3* 2.6* 2.3*    Recent Labs  Lab 03/15/21 1109 03/17/21 2051  LIPASE 50 533*   Recent Labs  Lab 03/15/21 1109  AMMONIA 23    Coagulation Profile: Recent Labs  Lab 03/16/21 0314 03/17/21 2234  INR 1.2 1.2     Recent Results (from the past 240 hour(s))  Culture, blood (routine x 2)     Status: Abnormal   Collection Time: 03/15/21 11:09 AM   Specimen: BLOOD  Result Value Ref Range Status   Specimen Description   Final    BLOOD RIGHT HAND Performed at [facility], [address] [place], [place] [address]    Special Requests   Final    BOTTLES DRAWN AEROBIC AND ANAEROBIC Blood Culture results may not be optimal due to an inadequate volume of blood received in culture bottles Performed at [facility], [address] [place], [place] [address]    Culture  Setup Time   Final    Organism ID to follow [place] TO, READ BACK BY AND VERIFIED WITH: ABBY ELLINGTON AT 1103 03/16/21 [place] Performed at [facility], [address] [place], [place] [address]    Culture (A)  Final     STAPHYLOCOCCUS EPIDERMIDIS THE SIGNIFICANCE OF ISOLATING THIS ORGANISM FROM A SINGLE SET OF BLOOD CULTURES WHEN MULTIPLE SETS ARE  DRAWN IS UNCERTAIN. PLEASE NOTIFY THE MICROBIOLOGY DEPARTMENT WITHIN ONE WEEK IF SPECIATION AND SENSITIVITIES ARE REQUIRED. Performed at [facility], [place] [address], [place], [place] [address]    Report Status 03/17/2021 FINAL  Final  Resp Panel by RT-PCR (Flu A&B, Covid) Nasopharyngeal Swab     Status: None   Collection Time: 03/15/21 11:09 AM   Specimen: Nasopharyngeal Swab; Nasopharyngeal(NP) swabs in vial transport medium  Result Value Ref Range Status   SARS Coronavirus 2 by RT PCR NEGATIVE NEGATIVE Final    Comment: (NOTE) SARS-CoV-2 target nucleic acids are NOT DETECTED.  The SARS-CoV-2 RNA is generally detectable in upper respiratory specimens during the acute phase of infection. The lowest concentration of SARS-CoV-2 viral copies this assay can detect is 138 copies/mL. A negative result does not preclude SARS-Cov-2 infection and should not be used as the sole basis for treatment or other patient management decisions. A negative result may occur with  improper specimen collection/handling, submission of specimen other than nasopharyngeal swab, presence of viral mutation(s) within the areas targeted by this assay, and inadequate number of viral copies(<138 copies/mL). A negative result must be combined with clinical observations, patient history, and epidemiological information. The expected result is Negative.  Fact Sheet for Patients:  EntrepreneurPulse.com.au  Fact Sheet for Healthcare Providers:  IncredibleEmployment.be  This test is no t yet approved or cleared by the [place] FDA and  has been authorized for detection and/or diagnosis of SARS-CoV-2 by FDA under an Emergency Use Authorization (EUA). This EUA will remain  in effect (meaning this test can be used) for the duration of the COVID-19  declaration under Section 564(b)(1) of the Act, 21 U.S.C.section 360bbb-3(b)(1), unless the authorization is terminated  or revoked sooner.       Influenza A by PCR NEGATIVE NEGATIVE Final   Influenza B by PCR NEGATIVE NEGATIVE Final    Comment: (NOTE) The Xpert Xpress SARS-CoV-2/FLU/RSV plus assay is intended as an aid in the diagnosis of influenza from Nasopharyngeal swab specimens and should not be used as a sole basis for treatment. Nasal washings and aspirates are unacceptable for Xpert Xpress SARS-CoV-2/FLU/RSV testing.  Fact Sheet for Patients: EntrepreneurPulse.com.au  Fact Sheet for Healthcare Providers: IncredibleEmployment.be  This test is not yet approved or cleared by the [place] FDA and has been authorized for detection and/or diagnosis of SARS-CoV-2 by FDA under an Emergency Use Authorization (EUA). This EUA will remain in effect (meaning this test can be used) for the duration of the COVID-19 declaration under Section 564(b)(1) of the Act, 21 U.S.C. section 360bbb-3(b)(1), unless the authorization is terminated or revoked.  Performed at [facility], [address] [place], [place] [address]   Blood Culture ID Panel (Reflexed)     Status: Abnormal   Collection Time: 03/15/21 11:09 AM  Result Value Ref Range Status   Enterococcus faecalis NOT DETECTED NOT DETECTED Final   Enterococcus Faecium NOT DETECTED NOT DETECTED Final   Listeria monocytogenes NOT DETECTED NOT DETECTED Final   Staphylococcus species DETECTED (A) NOT DETECTED Final    Comment: CRITICAL RESULT CALLED TO, READ BACK BY AND VERIFIED WITH:  ABBY ELLINGTON AT 1103 03/16/21 SDR    Staphylococcus aureus (BCID) NOT DETECTED NOT DETECTED Final   Staphylococcus epidermidis DETECTED (A) NOT DETECTED Final    Comment: Methicillin (oxacillin) resistant coagulase negative staphylococcus. Possible blood culture contaminant (unless isolated from more  than one blood culture draw or clinical case suggests pathogenicity). No antibiotic treatment is indicated for  blood  culture contaminants. CRITICAL RESULT CALLED TO, READ BACK BY AND VERIFIED WITH:  ABBY ELLINGTON AT 4709 03/16/21 SDR    Staphylococcus lugdunensis NOT DETECTED NOT DETECTED Final   Streptococcus species NOT DETECTED NOT DETECTED Final   Streptococcus agalactiae NOT DETECTED NOT DETECTED Final   Streptococcus pneumoniae NOT DETECTED NOT DETECTED Final   Streptococcus pyogenes NOT DETECTED NOT DETECTED Final   A.calcoaceticus-baumannii NOT DETECTED NOT DETECTED Final   Bacteroides fragilis NOT DETECTED NOT DETECTED Final   Enterobacterales NOT DETECTED NOT DETECTED Final   Enterobacter cloacae complex NOT DETECTED NOT DETECTED Final   Escherichia coli NOT DETECTED NOT DETECTED Final   Klebsiella aerogenes NOT DETECTED NOT DETECTED Final   Klebsiella oxytoca NOT DETECTED NOT DETECTED Final   Klebsiella pneumoniae NOT DETECTED NOT DETECTED Final   Proteus species NOT DETECTED NOT DETECTED Final   Salmonella species NOT DETECTED NOT DETECTED Final   Serratia marcescens NOT DETECTED NOT DETECTED Final   Haemophilus influenzae NOT DETECTED NOT DETECTED Final   Neisseria meningitidis NOT DETECTED NOT DETECTED Final   Pseudomonas aeruginosa NOT DETECTED NOT DETECTED Final   Stenotrophomonas maltophilia NOT DETECTED NOT DETECTED Final   Candida albicans NOT DETECTED NOT DETECTED Final   Candida auris NOT DETECTED NOT DETECTED Final   Candida glabrata NOT DETECTED NOT DETECTED Final   Candida krusei NOT DETECTED NOT DETECTED Final   Candida parapsilosis NOT DETECTED NOT DETECTED Final   Candida tropicalis NOT DETECTED NOT DETECTED Final   Cryptococcus neoformans/gattii NOT DETECTED NOT DETECTED Final   Methicillin resistance mecA/C DETECTED (A) NOT DETECTED Final    Comment: CRITICAL RESULT CALLED TO, READ BACK BY AND VERIFIED WITH:  ABBY ELLINGTON AT 1103 03/16/21  SDR Performed at [facility], [address] [place], [place] [address]   Culture, blood (routine x 2)     Status: None (Preliminary result)   Collection Time: 03/15/21 12:10 PM   Specimen: BLOOD  Result Value Ref Range Status   Specimen Description BLOOD RIGHT [facility]  Final   Special Requests   Final    BOTTLES DRAWN AEROBIC AND ANAEROBIC Blood Culture adequate volume   Culture   Final    NO GROWTH 4 DAYS Performed at [facility], [address] [place], [place] [address]    Report Status PENDING  Incomplete  MRSA PCR Screening     Status: None   Collection Time: 03/15/21  2:30 PM   Specimen: Nasal Mucosa; Nasopharyngeal  Result Value Ref Range Status   MRSA by PCR NEGATIVE NEGATIVE Final    Comment:        The GeneXpert MRSA Assay (FDA approved for NASAL specimens only), is one component of a comprehensive MRSA colonization surveillance program. It is not intended to diagnose MRSA infection nor to guide or monitor treatment for MRSA infections. Performed at [facility], [place],  [address]   SARS CORONAVIRUS 2 (TAT 6-24 HRS) Nasopharyngeal Nasopharyngeal Swab     Status: None   Collection Time: 03/17/21 10:34 PM   Specimen: Nasopharyngeal Swab  Result Value Ref Range Status   SARS Coronavirus 2 NEGATIVE NEGATIVE Final    Comment: (NOTE) SARS-CoV-2 target nucleic acids are NOT DETECTED.  The SARS-CoV-2 RNA is generally detectable in upper and lower respiratory specimens during the acute phase of infection. Negative results do not preclude SARS-CoV-2 infection, do not rule out co-infections with other pathogens, and should not be used as the sole basis for treatment or  other patient management decisions. Negative results must be combined with clinical observations, patient history, and epidemiological information. The expected result is Negative.  Fact Sheet for  Patients: SugarRoll.be  Fact Sheet for Healthcare Providers: https://www.woods-mathews.com/  This test is not yet approved or cleared by the [place] FDA and  has been authorized for detection and/or diagnosis of SARS-CoV-2 by FDA under an Emergency Use Authorization (EUA). This EUA will remain  in effect (meaning this test can be used) for the duration of the COVID-19 declaration under Se ction 564(b)(1) of the Act, 21 U.S.C. section 360bbb-3(b)(1), unless the authorization is terminated or revoked sooner.  Performed at [facility], [place] [address], [place], [place] [address]       Radiology Studies: CT Angio Chest PE W/Cm &/Or Wo Cm  Addendum Date: 03/17/2021   ADDENDUM REPORT: 03/17/2021 23:51 ADDENDUM: Critical Value/emergent results were called by telephone at the time of interpretation on 03/17/2021 at 11:46 pm to provider [facility] , who verbally acknowledged these results. Electronically Signed   By: Ulyses Jarred M.D.   On: 03/17/2021 23:51   Result Date: 03/17/2021 CLINICAL DATA:  Stage IV metastatic lung carcinoma. Discharged from ICU today. Lower abdominal pain after enema. EXAM: CT ANGIOGRAPHY CHEST CT ABDOMEN AND PELVIS WITH CONTRAST TECHNIQUE: Multidetector CT imaging of the chest was performed using the standard protocol during bolus administration of intravenous contrast. Multiplanar CT image reconstructions and MIPs were obtained to evaluate the vascular anatomy. Multidetector CT imaging of the abdomen and pelvis was performed using the standard protocol during bolus administration of intravenous contrast. CONTRAST:  37mL OMNIPAQUE IOHEXOL 350 MG/ML SOLN COMPARISON:  None. FINDINGS: CTA CHEST FINDINGS Cardiovascular: Contrast injection is sufficient to demonstrate satisfactory opacification of the pulmonary arteries to the segmental level.There are filling defects within the proximal right middle lobar artery and within  the posterior segmental branches of the right lower lobe. On the left, there are filling defects within the left lower lobe segmental branches. The main pulmonary artery is within normal limits for size. There is no CT evidence of acute right heart strain. Mild calcific aortic atherosclerosis. Aorta is otherwise normal. There is a normal 3-vessel arch branching pattern. Heart size is normal, without pericardial effusion. Mediastinum/Nodes: Unchanged size of right hilar mass measuring 4.3 x 3.1 cm. Left hilar nodes are unchanged. Confluent pretracheal adenopathy also unchanged. Lungs/Pleura: Widespread ground-glass opacities are new since the prior study. Right middle lobe nodule (image 48) has decreased in size measuring 2.0 x 1.7 cm, previously 2.6 x 2.7 cm. There are small bilateral pleural effusions. 4 mm nodule in the left upper lobe is unchanged (image 42). Musculoskeletal: Sclerotic focus in the right aspect of the T5 vertebral body has markedly increased in size. Lytic lesion at T9 is unchanged. T7 lucent lesion is also unchanged. Review of the MIP images confirms the above findings. CT ABDOMEN and PELVIS FINDINGS Hepatobiliary: Unchanged hypodensity in the left hepatic lobe (2:12 measuring 12 mm. There is faint peripheral enhancement. There is also a small area of hyperenhancement more laterally at the same level (see aero). This is at the site of the hypodense focus seen on the previous CT. Subcapsular hypodensity with peripheral enhancement is unchanged (2:20). Status post cholecystectomy. Pancreas: Normal contours without ductal dilatation. No peripancreatic fluid collection. Spleen: Normal. Adrenals/Urinary Tract: --Adrenal glands: Normal. 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 --Left kidney/ureter: Proximal left ureter is dilated. There  is focal hypoattenuation at the lower pole of the left kidney  with suspected tumor thrombus in the lower branches of the left renal artery. --Urinary bladder: Dilated Stomach/Bowel: --Stomach/Duodenum: No hiatal hernia or other gastric abnormality. Normal duodenal course and caliber. --Small bowel: No dilatation or inflammation. --Colon: No focal abnormality. --Appendix: Normal. 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 There is calcific atherosclerosis of the aorta. The portal vein, splenic vein and superior mesenteric vein are patent. The IVC is patent. There is a 12 mm retrocaval lymph node, unchanged. Multiple subcentimeter retroperitoneal nodes. Reproductive: There is a small amount of free fluid in the pelvis. Normal uterus. No adnexal mass. Musculoskeletal. Mixed density appearance of L2 is unchanged. Small sclerotic focus within the S1 body has slightly increased in size. Other: None. IMPRESSION: 1. Acute pulmonary emboli within multiple bilateral lobar and segmental branches. No CT evidence of acute right heart strain. 2. Widespread ground-glass opacities, new since the prior study. This could indicate infection or post treatment change. Lymphangitic tumor spread is also possible. New small bilateral pleural effusions. 3. Unchanged size of right hilar mass and mediastinal lymphadenopathy. Right middle lobe dominant nodule has decreased in size. 4. 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 Unchanged appearance of liver metastases allowing for differences in contrast timing. 6. Multiple osseous metastases, worsened clearly at T5. Aortic Atherosclerosis (ICD10-I70.0). Electronically Signed: By: Ulyses Jarred M.D. On: 03/17/2021 23:39   CT Abdomen Pelvis W Contrast  Addendum Date: 03/17/2021   ADDENDUM REPORT: 03/17/2021 23:51 ADDENDUM: Critical Value/emergent results were called by telephone at the time of interpretation on 03/17/2021 at 11:46 pm to provider [facility] ,  who verbally acknowledged these results. Electronically Signed   By: Ulyses Jarred M.D.   On: 03/17/2021 23:51   Result Date: 03/17/2021 CLINICAL DATA:  Stage IV metastatic lung carcinoma. Discharged from ICU today. Lower abdominal pain after enema. EXAM: CT ANGIOGRAPHY CHEST CT ABDOMEN AND PELVIS WITH CONTRAST TECHNIQUE: Multidetector CT imaging of the chest was performed using the standard protocol during bolus administration of intravenous contrast. Multiplanar CT image reconstructions and MIPs were obtained to evaluate the vascular anatomy. Multidetector CT imaging of the abdomen and pelvis was performed using the standard protocol during bolus administration of intravenous contrast. CONTRAST:  83mL OMNIPAQUE IOHEXOL 350 MG/ML SOLN COMPARISON:  None. FINDINGS: CTA CHEST FINDINGS Cardiovascular: Contrast injection is sufficient to demonstrate satisfactory opacification of the pulmonary arteries to the segmental level.There are filling defects within the proximal right middle lobar artery and within the posterior segmental branches of the right lower lobe. On the left, there are filling defects within the left lower lobe segmental branches. The main pulmonary artery is within normal limits for size. There is no CT evidence of acute right heart strain. Mild calcific aortic atherosclerosis. Aorta is otherwise normal. There is a normal 3-vessel arch branching pattern. Heart size is normal, without pericardial effusion. Mediastinum/Nodes: Unchanged size of right hilar mass measuring 4.3 x 3.1 cm. Left hilar nodes are unchanged. Confluent pretracheal adenopathy also unchanged. Lungs/Pleura: Widespread ground-glass opacities are new since the prior study. Right middle lobe nodule (image 48) has decreased in size measuring 2.0 x 1.7 cm, previously 2.6 x 2.7 cm. There are small bilateral pleural effusions. 4 mm nodule in the left upper lobe is unchanged (image 42). Musculoskeletal: Sclerotic focus in the right aspect of  the T5 vertebral body has markedly increased in size. Lytic lesion at T9 is unchanged.  T7 lucent lesion is also unchanged. Review of the MIP images confirms the above findings. CT ABDOMEN and PELVIS FINDINGS Hepatobiliary: Unchanged hypodensity in the left hepatic lobe (2:12 measuring 12 mm. There is faint peripheral enhancement. There is also a small area of hyperenhancement more laterally at the same level (see aero). This is at the site of the hypodense focus seen on the previous CT. Subcapsular hypodensity with peripheral enhancement is unchanged (2:20). Status post cholecystectomy. Pancreas: Normal contours without ductal dilatation. No peripancreatic fluid collection. Spleen: Normal. Adrenals/Urinary Tract: --Adrenal glands: Normal. 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 --Left kidney/ureter: Proximal left ureter is dilated. There is focal hypoattenuation at the lower pole of the left kidney with suspected tumor thrombus in the lower branches of the left renal artery. --Urinary bladder: Dilated Stomach/Bowel: --Stomach/Duodenum: No hiatal hernia or other gastric abnormality. Normal duodenal course and caliber. --Small bowel: No dilatation or inflammation. --Colon: No focal abnormality. --Appendix: Normal. 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 There is calcific atherosclerosis of the aorta. The portal vein, splenic vein and superior mesenteric vein are patent. The IVC is patent. There is a 12 mm retrocaval lymph node, unchanged. Multiple subcentimeter retroperitoneal nodes. Reproductive: There is a small amount of free fluid in the pelvis. Normal uterus. No adnexal mass. Musculoskeletal. Mixed density appearance of L2 is unchanged. Small sclerotic focus within the S1 body has slightly increased in size. Other: None. IMPRESSION: 1. Acute pulmonary emboli within  multiple bilateral lobar and segmental branches. No CT evidence of acute right heart strain. 2. Widespread ground-glass opacities, new since the prior study. This could indicate infection or post treatment change. Lymphangitic tumor spread is also possible. New small bilateral pleural effusions. 3. Unchanged size of right hilar mass and mediastinal lymphadenopathy. Right middle lobe dominant nodule has decreased in size. 4. 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 Unchanged appearance of liver metastases allowing for differences in contrast timing. 6. Multiple osseous metastases, worsened clearly at T5. Aortic Atherosclerosis (ICD10-I70.0). Electronically Signed: By: Ulyses Jarred M.D. On: 03/17/2021 23:39   DG Chest Portable 1 View  Result Date: 03/17/2021 CLINICAL DATA:  Shortness of breath EXAM: PORTABLE CHEST 1 VIEW COMPARISON:  03/15/2021 FINDINGS: Worsening bilateral opacities throughout both lungs. No pneumothorax or sizable pleural effusion. Normal cardiomediastinal contours. IMPRESSION: Worsening bilateral airspace opacities. Electronically Signed   By: Ulyses Jarred M.D.   On: 03/17/2021 21:23       LOS: 1 day   [facility] Pager on [email or web address]  03/19/2021, 8:47 AM

## 2021-03-19 NOTE — Plan of Care (Signed)
Will continue to keep patient comfortable.

## 2021-03-19 NOTE — TOC Initial Note (Signed)
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 Patient was in the hospital and send home and since going home she stopped eating and able to hold down food. Patient's sister, Brenda Middleton, stated that the doctor is consulting palliative care and wanting hospice care due to poor prognosis and has made transition into comfort care.   Patient's sister sated that their other sister is flying in to be here. Patient's sister is unaware of any other information at this time until palliative comes in.   Expected Discharge Plan: [place] Barriers to Discharge: Continued Medical Work up   Patient Goals and CMS Choice Patient states their goals for this hospitalization and ongoing recovery are:: Patient's sister Brenda Middleton stated, "we are in discussion about hospice" CMS Medicare.gov Compare Post Acute Care list provided to:: Patient Represenative (must comment) (Patient's sister) Choice offered to / list presented to : Sibling  Expected Discharge Plan and Services Expected Discharge Plan: [place] In-house Referral: Hospice / [place] arrangements for the past 2 months: Apartment                 DME Arranged: N/A DME Agency: NA       HH Arranged: NA          Prior Living Arrangements/Services Living arrangements for the past 2 months: Apartment Lives with:: Siblings Patient language and need for interpreter reviewed:: Yes Do you feel safe going back to the place where you live?: Yes      Need for Family Participation in Patient Care: Yes (Comment) (Patient's sister) Care giver support  system in place?: Yes (comment) (Patient's sister)   Criminal Activity/Legal Involvement Pertinent to Current Situation/Hospitalization: No - Comment as needed  Activities of Daily Living   ADL Screening (condition at time of admission) Weakness of Legs: Both Weakness of Arms/Hands: Both  Permission Sought/Granted Permission sought to share information with : Case Manager,Family Supports Permission granted to share information with : Yes, Release of Information Signed  Share Information with NAME: Brenda Middleton     Permission granted to share info w Relationship: Sister, Brenda Middleton     Emotional Assessment Appearance:: Appears older than stated age Attitude/Demeanor/Rapport: Apprehensive,Engaged Affect (typically observed): Accepting Orientation: : Oriented to Self,Oriented to Place,Oriented to  Time,Oriented to Situation Alcohol / Substance Use: Not Applicable Psych Involvement: No (comment)  Admission diagnosis:  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 Acute pulmonary embolism ([place]) 03/18/2021  . Metastatic lung cancer (metastasis from lung to other site) ([place]) 03/18/2021  . Primary malignant neoplasm of lung metastatic to other site [facility])   . 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 Primary malignant neoplasm of right lung metastatic to other site [facility])   . Anemia associated with chemotherapy   .  Opiate overdose ([place])   . Thrombocytopenia ([place]) 03/15/2021  . Hypokalemia 03/15/2021  . Hypomagnesemia 03/15/2021  . Acute respiratory failure with hypoxia ([place]) 03/07/2021  . Postobstructive pneumonia 02/27/2021  . HTN (hypertension) 02/27/2021   . Depression with anxiety 02/27/2021  . Normocytic anemia 02/27/2021  . Sepsis ([place]) 02/27/2021  . Protein-calorie malnutrition, moderate ([place]) 02/27/2021  . Malignant neoplasm of lung ([place]) 02/27/2021  . Elevated troponin 02/27/2021  . Toxic metabolic encephalopathy 41/28/7867  . Brain metastasis ([place]) 02/27/2021  . Shortness of breath   . Metastatic malignant neoplasm ([place])   . Intractable low back pain 02/18/2021  . Anxiety   . Depressive disorder   . Pathologic fracture of lumbar vertebra, initial encounter   . 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 COPD exacerbation ([place])   . GERD (gastroesophageal reflux disease)   . Palliative care encounter   . Pathologic compression fracture of lumbar vertebra ([place])   . Goals of care, counseling/discussion   . Neoplasm related pain   . Iron deficiency anemia due to chronic blood loss 09/12/2019  . Family history of cancer 09/12/2019  . Blood in the stool 09/12/2019  . Elevated alkaline phosphatase level 09/12/2019  . Chronic low back pain 08/26/2014   PCP:  Remi Haggard, FNP Pharmacy:   [place], [place] [address] Phone: [phone] Fax: [phone]     Social Determinants of Health (SDOH) Interventions    Readmission Risk Interventions Readmission Risk Prevention Plan 02/28/2021  Transportation Screening Complete  Medication Review ([place]) Complete  PCP or Specialist appointment within 3-5 days of discharge Complete  HRI or Home Care Consult Patient refused  SW Recovery Care/Counseling Consult Complete  Palliative Care Screening Not [place] Not Applicable  Some recent data might be hidden

## 2021-03-20 DIAGNOSIS — G893 Neoplasm related pain (acute) (chronic): Secondary | ICD-10-CM

## 2021-03-20 LAB — CULTURE, BLOOD (ROUTINE X 2)
Culture: NO GROWTH
Special Requests: ADEQUATE

## 2021-03-20 MED ORDER — MORPHINE SULFATE (CONCENTRATE) 10 MG/0.5ML PO SOLN: 10.0000 mg | ORAL | Status: DC | PRN

## 2021-03-20 NOTE — Progress Notes (Signed)
[facility] PROGRESS NOTE   Brenda Middleton QPY:[id number] DOB: 1961/02/07 DOA: 03/17/2021  PCP: Remi Haggard, FNP  Brief History/Interval Summary: 61 y.o.  female with medical history significant for metastatic lung cancer, and anxiety and depression and hypertension, who was just admitted here on 4/12 for toxic encephalopathy with polypharmacy bibasal infiltrates and possible pneumonia, hypokalemia and hypomagnesemia and was discharged on 4/14 as she wanted to go home.  Unfortunately when she went home she started having abdominal pain with associated nausea and vomiting.  She was unable to keep any food or fluids down.    Upon assessment in the ED she was found to be tachycardic.  She was also noted to have an increased respiratory rate.  CT angiogram was done which was positive for pulmonary embolism.  She was placed on heparin.  She was hospitalized for further management.    Consultants:  Palliative care.   Medical oncology  Procedures: None at this time  Antibiotics: Anti-infectives (From admission, onward)   Start     Dose/Rate Route Frequency Ordered Stop   03/18/21 2200  azithromycin (ZITHROMAX) 500 mg in sodium chloride 0.9 % 250 mL IVPB  Status:  Discontinued        500 mg 250 mL/hr over 60 Minutes Intravenous Every 24 hours 03/18/21 0156 03/18/21 1754   03/18/21 0800  cefTRIAXone (ROCEPHIN) 2 g in sodium chloride 0.9 % 100 mL IVPB  Status:  Discontinued        2 g 200 mL/hr over 30 Minutes Intravenous Every 24 hours 03/18/21 0156 03/18/21 1754   03/17/21 2215  ceFEPIme (MAXIPIME) 2 g in sodium chloride 0.9 % 100 mL IVPB        2 g 200 mL/hr over 30 Minutes Intravenous  Once 03/17/21 2212 03/18/21 0123   03/17/21 2215  azithromycin (ZITHROMAX) 500 mg in sodium chloride 0.9 % 250 mL IVPB        500 mg 250 mL/hr over 60 Minutes Intravenous  Once 03/17/21 2212 03/18/21 0226      Subjective/Interval History: Patient noted to be awake this morning.  Answers a  few questions but then goes back to sleep.  Discussed with nursing staff.  She really did not eat or drink anything yesterday.  They will offer her food and drink today and see how she does.  Pain seems to be reasonably well controlled.  No family at bedside this morning.    Assessment/Plan:  Goals of care/end-of-life care Patient with stage IV lung cancer with diffuse metastases the skeletal system as well as brain.  Seen by medical oncology.  Underwent first cycle which she did not tolerate well.  Prognosis per oncology is extremely poor.  Patient was hospitalized last week and hospice was recommended which the patient declined.  She went home.  Came back within a few hours due to uncontrolled pain.  Subsequently found to have acute pulmonary embolism which is likely due to her hypercoagulable state from her malignancy.  Discussions held with the patient's sister.  Patient was unable to communicate as she remained obtunded. Considering poor prognosis and after discussion with other family members, decision was made to transition her to comfort care. Pain seems to be reasonably well controlled.  Patient seems to be waking up some.  If she is able to wake up and tolerate oral intake then residential hospice may not be an option.  We will see how she does over the next 24 to 48 hours.  Acute bilateral  pulmonary embolism in the setting of metastatic lung cancer Patient likely hypercoagulable due to her malignancy.  Patient was initially started on IV heparin.  Platelet counts were noted to be decreasing.  Subsequently discussions were held as mentioned above.  Now comfort care.  Heparin was discontinued.    Concern for multifocal pneumonia/acute respiratory failure with hypoxia Findings on the CT scan could also be due to her malignancy.  She was empirically started on IV antibiotics which was subsequently discontinued.  Leukocytosis could also be due to steroids.  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  Once again due to her multiple other comorbidities and poor overall prognosis she is not a candidate for any aggressive interventions.   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  Seen by medical oncology and her prognosis is thought to be extremely poor.  Steroids were also discontinued.  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  Her pain was very difficult to control.  We are continuing her fentanyl patch and she is on multiple as needed medications.  Pain seems to be reasonably well controlled at this time.  Elevated lipase level Etiology is not clear.  Pancreas did not show any concerning findings on CT scan.    History of depression and anxiety Continue Seroquel.  Noted to be on Ativan as well.    Peripheral neuropathy On gabapentin.  Essential hypertension Elevated blood pressure most likely due to poorly controlled pain.      Polypharmacy/recent toxic encephalopathy Likely due to medications as well as her malignancy.  Now comfort care.  Normocytic anemia/thrombocytopenia This is all secondary to malignancy as well as chemotherapy.  Consumption coagulopathy is also a possibility considering that she has a PE.    Pressure injury/stage II decubitus Pressure Injury 03/15/21 Coccyx Mid Stage 2 -  Partial thickness loss of dermis presenting as a shallow open injury with a red, pink wound bed without slough. (Active)  03/15/21 1601  Location: Coccyx  Location Orientation: Mid  Staging: Stage 2 -  Partial thickness loss of dermis presenting as a shallow open injury with a red, pink wound bed without slough.  Wound Description (Comments):   Present on Admission: Yes      DVT Prophylaxis: Comfort care Code Status: DNR Family Communication: Discussed with sister yesterday.  No family at bedside today. Disposition Plan:  Residential hospice versus home with hospice if she has adequate support . Status is: Inpatient  Remains inpatient appropriate because:Ongoing active pain requiring inpatient pain management, Altered mental status and IV treatments appropriate due to intensity of illness or inability to take PO   Dispo: The patient is from: Home              Anticipated d/c is to: To be determined              Patient currently is not medically stable to d/c.   Difficult to place patient No      Medications:  Scheduled: . fentaNYL  1 patch Transdermal Q72H  . gabapentin  300 mg Oral QHS  . QUEtiapine  200 mg Oral QHS   Continuous:  EXB:[id number] **OR** acetaminophen, antiseptic oral rinse, diphenhydrAMINE, glycopyrrolate **OR** glycopyrrolate **OR** glycopyrrolate, haloperidol **OR** haloperidol **OR** haloperidol lactate, HYDROmorphone (DILAUDID) injection, lidocaine-prilocaine, LORazepam **OR** LORazepam **OR** LORazepam, morphine injection, ondansetron **OR** ondansetron (ZOFRAN) IV, oxyCODONE, polyvinyl alcohol  Objective:  Vital Signs  Vitals:   03/18/21 1758 03/18/21 1919 03/19/21 0815 03/20/21 0420  BP:  (!) 168/104 (!) 140/97 (!) 162/97  Pulse: (!) 116 (!) 115 (!) 119 (!) 117  Resp: 15 16 16 17   Temp:  97.7 F (36.5 C)  98.3 F (36.8 C)  TempSrc:    Oral  SpO2: 100% 93% 90% 100%  Weight:  49.8 kg    Height:        Intake/Output Summary (Last 24 hours) at 03/20/2021 0932 Last data filed at 03/19/2021 2047 Gross per 24 hour  Intake 240 ml  Output 525 ml  Net -285 ml   Filed Weights   03/17/21 2045 03/18/21 1919  Weight: 50.7 kg 49.8 kg    General appearance: Opens her eyes.  Denies any pain this morning.  But then closes her eyes again and goes back to sleep. Resp: Mild tachypnea when she wakes up.  Diminished air entry bilaterally.  Few crackles at the bases. Cardio: S1-S2 is tachycardic regular.  No S3-S4.  No rubs murmurs or bruit GI: Abdomen is soft.  Nontender  nondistended.  Bowel sounds are present normal.  No masses organomegaly    Lab Results:  Data Reviewed: I have personally reviewed following labs and imaging studies  CBC: Recent Labs  Lab 03/14/21 0817 03/15/21 1109 03/16/21 0314 03/17/21 0321 03/17/21 2051 03/18/21 0447  WBC 27.6* 18.1* 16.5* 15.8* 16.2* 12.7*  NEUTROABS 24.3* 16.9*  --   --  14.6*  --   HGB 9.7* 8.5* 7.6* 8.2* 8.8* 8.1*  HCT 30.3* 25.9* 23.6* 24.8* 26.6* 25.2*  MCV 87.6 84.6 84.3 83.5 83.6 84.3  PLT 122* 97* 99* 75* 63* 49*    Basic Metabolic Panel: Recent Labs  Lab 03/14/21 0817 03/15/21 1109 03/16/21 0314 03/17/21 0321 03/17/21 2051 03/18/21 0447  NA 139 140 140 139 135 136  K 2.6* 3.6 4.0 4.2 4.0 3.5  CL 100 106 109 108 103 105  CO2 26 23 24 23 22 22   GLUCOSE 127* 137* 143* 157* 82 80  BUN 30* 30* 26* 24* 21* 17  CREATININE 1.02* 0.86 0.72 0.61 0.70 0.54  CALCIUM 8.4* 8.2* 7.7* 7.8* 7.9* 7.5*  MG 1.7  --   --   --   --   --     GFR: Estimated Creatinine Clearance: 58.8 mL/min (by C-G formula based on SCr of 0.54 mg/dL).  Liver Function Tests: Recent Labs  Lab 03/14/21 0817 03/15/21 1109 03/17/21 2051  AST 30 27 89*  ALT 30 27 52*  ALKPHOS 331* 278* 295*  BILITOT 0.5 0.4 1.0  PROT 7.0 6.1* 6.0*  ALBUMIN 3.3* 2.6* 2.3*    Recent Labs  Lab 03/15/21 1109 03/17/21 2051  LIPASE 50 533*   Recent Labs  Lab 03/15/21 1109  AMMONIA 23    Coagulation Profile: Recent Labs  Lab 03/16/21 0314 03/17/21 2234  INR 1.2 1.2     Recent Results (from the past 240 hour(s))  Culture, blood (routine x 2)     Status: Abnormal   Collection Time: 03/15/21 11:09 AM   Specimen: BLOOD  Result Value Ref Range Status   Specimen Description   Final    BLOOD RIGHT HAND Performed at [facility], [address], [place], [place] [address]    Special Requests   Final    BOTTLES DRAWN AEROBIC AND ANAEROBIC Blood Culture results may not be optimal due to an inadequate volume of  blood received in culture  bottles Performed at [facility], [address] [place], [place] [address]    Culture  Setup Time   Final    Organism ID to follow GRAM POSITIVE COCCI AEROBIC BOTTLE ONLY CRITICAL RESULT CALLED TO, READ BACK BY AND VERIFIED WITH: ABBY ELLINGTON AT 1103 03/16/21 SDR Performed at [facility], [address] [place], [place] [address]    Culture (A)  Final    STAPHYLOCOCCUS EPIDERMIDIS THE SIGNIFICANCE OF ISOLATING THIS ORGANISM FROM A SINGLE SET OF BLOOD CULTURES WHEN MULTIPLE SETS ARE DRAWN IS UNCERTAIN. PLEASE NOTIFY THE MICROBIOLOGY DEPARTMENT WITHIN ONE WEEK IF SPECIATION AND SENSITIVITIES ARE REQUIRED. Performed at [facility], [place] [address], [place], [place] [address]    Report Status 03/17/2021 FINAL  Final  Resp Panel by RT-PCR (Flu A&B, Covid) Nasopharyngeal Swab     Status: None   Collection Time: 03/15/21 11:09 AM   Specimen: Nasopharyngeal Swab; Nasopharyngeal(NP) swabs in vial transport medium  Result Value Ref Range Status   SARS Coronavirus 2 by RT PCR NEGATIVE NEGATIVE Final    Comment: (NOTE) SARS-CoV-2 target nucleic acids are NOT DETECTED.  The SARS-CoV-2 RNA is generally detectable in upper respiratory specimens during the acute phase of infection. The lowest concentration of SARS-CoV-2 viral copies this assay can detect is 138 copies/mL. A negative result does not preclude SARS-Cov-2 infection and should not be used as the sole basis for treatment or other patient management decisions. A negative result may occur with  improper specimen collection/handling, submission of specimen other than nasopharyngeal swab, presence of viral mutation(s) within the areas targeted by this assay, and inadequate number of viral copies(<138 copies/mL). A negative result must be combined with clinical observations, patient history, and epidemiological information. The expected result is Negative.  Fact Sheet for Patients:   EntrepreneurPulse.com.au  Fact Sheet for Healthcare Providers:  IncredibleEmployment.be  This test is no t yet approved or cleared by the [place] FDA and  has been authorized for detection and/or diagnosis of SARS-CoV-2 by FDA under an Emergency Use Authorization (EUA). This EUA will remain  in effect (meaning this test can be used) for the duration of the COVID-19 declaration under Section 564(b)(1) of the Act, 21 U.S.C.section 360bbb-3(b)(1), unless the authorization is terminated  or revoked sooner.       Influenza A by PCR NEGATIVE NEGATIVE Final   Influenza B by PCR NEGATIVE NEGATIVE Final    Comment: (NOTE) The Xpert Xpress SARS-CoV-2/FLU/RSV plus assay is intended as an aid in the diagnosis of influenza from Nasopharyngeal swab specimens and should not be used as a sole basis for treatment. Nasal washings and aspirates are unacceptable for Xpert Xpress SARS-CoV-2/FLU/RSV testing.  Fact Sheet for Patients: EntrepreneurPulse.com.au  Fact Sheet for Healthcare Providers: IncredibleEmployment.be  This test is not yet approved or cleared by the [place] FDA and has been authorized for detection and/or diagnosis of SARS-CoV-2 by FDA under an Emergency Use Authorization (EUA). This EUA will remain in effect (meaning this test can be used) for the duration of the COVID-19 declaration under Section 564(b)(1) of the Act, 21 U.S.C. section 360bbb-3(b)(1), unless the authorization is terminated or revoked.  Performed at [facility], [address] [place], [place] [address]   Blood Culture ID Panel (Reflexed)     Status: Abnormal   Collection Time: 03/15/21 11:09 AM  Result Value Ref Range Status   Enterococcus faecalis NOT DETECTED NOT DETECTED Final   Enterococcus Faecium NOT DETECTED NOT DETECTED Final  Listeria monocytogenes NOT DETECTED NOT DETECTED Final   Staphylococcus  species DETECTED (A) NOT DETECTED Final    Comment: CRITICAL RESULT CALLED TO, READ BACK BY AND VERIFIED WITH:  ABBY ELLINGTON AT 1103 03/16/21 SDR    Staphylococcus aureus (BCID) NOT DETECTED NOT DETECTED Final   Staphylococcus epidermidis DETECTED (A) NOT DETECTED Final    Comment: Methicillin (oxacillin) resistant coagulase negative staphylococcus. Possible blood culture contaminant (unless isolated from more than one blood culture draw or clinical case suggests pathogenicity). No antibiotic treatment is indicated for blood  culture contaminants. CRITICAL RESULT CALLED TO, READ BACK BY AND VERIFIED WITH:  ABBY ELLINGTON AT 7564 03/16/21 SDR    Staphylococcus lugdunensis NOT DETECTED NOT DETECTED Final   Streptococcus species NOT DETECTED NOT DETECTED Final   Streptococcus agalactiae NOT DETECTED NOT DETECTED Final   Streptococcus pneumoniae NOT DETECTED NOT DETECTED Final   Streptococcus pyogenes NOT DETECTED NOT DETECTED Final   A.calcoaceticus-baumannii NOT DETECTED NOT DETECTED Final   Bacteroides fragilis NOT DETECTED NOT DETECTED Final   Enterobacterales NOT DETECTED NOT DETECTED Final   Enterobacter cloacae complex NOT DETECTED NOT DETECTED Final   Escherichia coli NOT DETECTED NOT DETECTED Final   Klebsiella aerogenes NOT DETECTED NOT DETECTED Final   Klebsiella oxytoca NOT DETECTED NOT DETECTED Final   Klebsiella pneumoniae NOT DETECTED NOT DETECTED Final   Proteus species NOT DETECTED NOT DETECTED Final   Salmonella species NOT DETECTED NOT DETECTED Final   Serratia marcescens NOT DETECTED NOT DETECTED Final   Haemophilus influenzae NOT DETECTED NOT DETECTED Final   Neisseria meningitidis NOT DETECTED NOT DETECTED Final   Pseudomonas aeruginosa NOT DETECTED NOT DETECTED Final   Stenotrophomonas maltophilia NOT DETECTED NOT DETECTED Final   Candida albicans NOT DETECTED NOT DETECTED Final   Candida auris NOT DETECTED NOT DETECTED Final   Candida glabrata NOT DETECTED NOT  DETECTED Final   Candida krusei NOT DETECTED NOT DETECTED Final   Candida parapsilosis NOT DETECTED NOT DETECTED Final   Candida tropicalis NOT DETECTED NOT DETECTED Final   Cryptococcus neoformans/gattii NOT DETECTED NOT DETECTED Final   Methicillin resistance mecA/C DETECTED (A) NOT DETECTED Final    Comment: CRITICAL RESULT CALLED TO, READ BACK BY AND VERIFIED WITH:  ABBY ELLINGTON AT 1103 03/16/21 SDR Performed at [facility], [address] [place], [place] [address]   Culture, blood (routine x 2)     Status: None   Collection Time: 03/15/21 12:10 PM   Specimen: BLOOD  Result Value Ref Range Status   Specimen Description BLOOD RIGHT [facility]  Final   Special Requests   Final    BOTTLES DRAWN AEROBIC AND ANAEROBIC Blood Culture adequate volume   Culture   Final    NO GROWTH 5 DAYS Performed at [facility], [address] [place], [place] [address]    Report Status 03/20/2021 FINAL  Final  MRSA PCR Screening     Status: None   Collection Time: 03/15/21  2:30 PM   Specimen: Nasal Mucosa; Nasopharyngeal  Result Value Ref Range Status   MRSA by PCR NEGATIVE NEGATIVE Final    Comment:        The GeneXpert MRSA Assay (FDA approved for NASAL specimens only), is one component of a comprehensive MRSA colonization surveillance program. It is not intended to diagnose MRSA infection nor to guide or monitor treatment for MRSA infections. Performed at [facility], [address] [place], [place] [address]   SARS CORONAVIRUS 2 (TAT 6-24 HRS) Nasopharyngeal Nasopharyngeal  Swab     Status: None   Collection Time: 03/17/21 10:34 PM   Specimen: Nasopharyngeal Swab  Result Value Ref Range Status   SARS Coronavirus 2 NEGATIVE NEGATIVE Final    Comment: (NOTE) SARS-CoV-2 target nucleic acids are NOT DETECTED.  The SARS-CoV-2 RNA is generally detectable in upper and lower respiratory specimens during the acute phase of infection. Negative results do not  preclude SARS-CoV-2 infection, do not rule out co-infections with other pathogens, and should not be used as the sole basis for treatment or other patient management decisions. Negative results must be combined with clinical observations, patient history, and epidemiological information. The expected result is Negative.  Fact Sheet for Patients: SugarRoll.be  Fact Sheet for Healthcare Providers: https://www.woods-mathews.com/  This test is not yet approved or cleared by the [place] FDA and  has been authorized for detection and/or diagnosis of SARS-CoV-2 by FDA under an Emergency Use Authorization (EUA). This EUA will remain  in effect (meaning this test can be used) for the duration of the COVID-19 declaration under Se ction 564(b)(1) of the Act, 21 U.S.C. section 360bbb-3(b)(1), unless the authorization is terminated or revoked sooner.  Performed at [facility], [place] [address], [place], [place] [address]       Radiology Studies: No results found.     LOS: 2 days   [facility] Pager on [email or web address]  03/20/2021, 9:32 AM

## 2021-03-20 NOTE — Progress Notes (Signed)
Nutrition Brief Note  Chart reviewed. Pt now transitioning to comfort care.  No further nutrition interventions planned at this time.  Please re-consult as needed.   Rajvi Armentor W, RD, LDN, CDCES Registered Dietitian II Certified Diabetes Care and Education Specialist Please refer to AMION for RD and/or RD on-call/weekend/after hours pager   

## 2021-03-21 ENCOUNTER — Ambulatory Visit: Payer: Medicaid Other

## 2021-03-21 DIAGNOSIS — I2609 Other pulmonary embolism with acute cor pulmonale: Secondary | ICD-10-CM

## 2021-03-21 DIAGNOSIS — C3490 Malignant neoplasm of unspecified part of unspecified bronchus or lung: Secondary | ICD-10-CM | POA: Diagnosis not present

## 2021-03-21 DIAGNOSIS — Z515 Encounter for palliative care: Secondary | ICD-10-CM | POA: Diagnosis not present

## 2021-03-21 DIAGNOSIS — N280 Ischemia and infarction of kidney: Secondary | ICD-10-CM | POA: Diagnosis not present

## 2021-03-21 DIAGNOSIS — I2699 Other pulmonary embolism without acute cor pulmonale: Secondary | ICD-10-CM | POA: Diagnosis not present

## 2021-03-21 DIAGNOSIS — Z7189 Other specified counseling: Secondary | ICD-10-CM

## 2021-03-21 DIAGNOSIS — C7931 Secondary malignant neoplasm of brain: Secondary | ICD-10-CM | POA: Diagnosis not present

## 2021-03-21 MED ORDER — PROMETHAZINE HCL 25 MG PO TABS
12.5000 mg | ORAL_TABLET | Freq: Four times a day (QID) | ORAL | Status: DC | PRN
  Administered 2021-03-21 – 2021-03-22 (×3): 12.5 mg via ORAL
  Filled 2021-03-21 (×7): qty 1

## 2021-03-21 MED ORDER — CLONAZEPAM 0.5 MG PO TABS
0.5000 mg | ORAL_TABLET | Freq: Three times a day (TID) | ORAL | Status: DC | PRN
  Administered 2021-03-22 (×2): 0.5 mg via ORAL
  Filled 2021-03-21 (×2): qty 1

## 2021-03-21 NOTE — Progress Notes (Signed)
[facility] PROGRESS NOTE   Gidget Quizhpi Lodes YDX:[id number] DOB: 1961/04/11 DOA: 03/17/2021  PCP: Remi Haggard, FNP  Brief History/Interval Summary: 60 y.o.  female with medical history significant for metastatic lung cancer, and anxiety and depression and hypertension, who was just admitted here on 4/12 for toxic encephalopathy with polypharmacy bibasal infiltrates and possible pneumonia, hypokalemia and hypomagnesemia and was discharged on 4/14 as she wanted to go home.  Unfortunately when she went home she started having abdominal pain with associated nausea and vomiting.  She was unable to keep any food or fluids down.    Upon assessment in the ED she was found to be tachycardic.  She was also noted to have an increased respiratory rate.  CT angiogram was done which was positive for pulmonary embolism.  She was placed on heparin.  She was hospitalized for further management.    Consultants:  Palliative care.   Medical oncology  Procedures: None at this time  Antibiotics: Anti-infectives (From admission, onward)   Start     Dose/Rate Route Frequency Ordered Stop   03/18/21 2200  azithromycin (ZITHROMAX) 500 mg in sodium chloride 0.9 % 250 mL IVPB  Status:  Discontinued        500 mg 250 mL/hr over 60 Minutes Intravenous Every 24 hours 03/18/21 0156 03/18/21 1754   03/18/21 0800  cefTRIAXone (ROCEPHIN) 2 g in sodium chloride 0.9 % 100 mL IVPB  Status:  Discontinued        2 g 200 mL/hr over 30 Minutes Intravenous Every 24 hours 03/18/21 0156 03/18/21 1754   03/17/21 2215  ceFEPIme (MAXIPIME) 2 g in sodium chloride 0.9 % 100 mL IVPB        2 g 200 mL/hr over 30 Minutes Intravenous  Once 03/17/21 2212 03/18/21 0123   03/17/21 2215  azithromycin (ZITHROMAX) 500 mg in sodium chloride 0.9 % 250 mL IVPB        500 mg 250 mL/hr over 60 Minutes Intravenous  Once 03/17/21 2212 03/18/21 0226      Subjective/Interval History: Patient lying on the bed with eyes closed.  Does open  her eyes when I call out her name.  Denies any pain currently.  Overnight events noted.  She was a bit agitated earlier this morning and pulled out her IV access.  She was given Ativan with improvement.     Assessment/Plan:  Goals of care/end-of-life care Patient with stage IV lung cancer with diffuse metastases the skeletal system as well as brain.  Seen by medical oncology.  Underwent first cycle which she did not tolerate well.  Prognosis per oncology is extremely poor.  Patient was hospitalized last week and hospice was recommended which the patient declined.  She went home.  Came back within a few hours due to uncontrolled pain.  Subsequently found to have acute pulmonary embolism which is likely due to her hypercoagulable state from her malignancy.  Discussions held with the patient's sister.  Patient was unable to communicate as she remained obtunded. Considering poor prognosis and after discussion with other family members, decision was made to transition her to comfort care. Patient seems to be comfortable for the most part.  Oral intake has been very poor.  She has been somnolent most of the time with occasional episodes of agitation.  Discussed with her sister this morning.  They do not have adequate support system at home to care for her in that setting.  Considering her poor oral intake residential hospice will  be pursued as her life expectancy will likely be less than 2 weeks.  Acute bilateral pulmonary embolism in the setting of metastatic lung cancer Patient likely hypercoagulable due to her malignancy.  Patient was initially started on IV heparin.  Platelet counts were noted to be decreasing.  Subsequently discussions were held as mentioned above.  Now comfort care.  Heparin was discontinued.    Concern for multifocal pneumonia/acute respiratory failure with hypoxia Findings on the CT scan could also be due to her malignancy.  She was empirically started on IV antibiotics which was  subsequently discontinued.  Leukocytosis could also be due to steroids.  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  Once again due to her multiple other comorbidities and poor overall prognosis she is not a candidate for any aggressive interventions.   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  Seen by medical oncology and her prognosis is thought to be extremely poor.  Steroids were also discontinued.  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  Her pain was very difficult to control.  We are continuing her fentanyl patch and she is on multiple as needed medications.  Pain seems to be reasonably well controlled at this time.  Elevated lipase level Etiology is not clear.  Pancreas did not show any concerning findings on CT scan.    History of depression and anxiety Continue Seroquel.  Noted to be on Ativan as well.    Peripheral neuropathy On gabapentin.  Essential hypertension Elevated blood pressure most likely due to poorly controlled pain.      Polypharmacy/recent toxic encephalopathy Likely due to medications as well as her malignancy.  Now comfort care.  Normocytic anemia/thrombocytopenia This is all secondary to malignancy as well as chemotherapy.  Consumption coagulopathy is also a possibility considering that she has a PE.    Pressure injury/stage II decubitus Pressure Injury 03/15/21 Coccyx Mid Stage 2 -  Partial thickness loss of dermis presenting as a shallow open injury with a red, pink wound bed without slough. (Active)  03/15/21 1601  Location: Coccyx  Location Orientation: Mid  Staging: Stage 2 -  Partial thickness loss of dermis presenting as a shallow open injury with a red, pink wound bed without slough.  Wound Description (Comments):   Present on Admission: Yes      DVT Prophylaxis: Comfort care Code Status:  DNR Family Communication: Discussed with sister yesterday.  No family at bedside today. Disposition Plan: Residential hospice. . Status is: Inpatient  Remains inpatient appropriate because:Ongoing active pain requiring inpatient pain management, Altered mental status and IV treatments appropriate due to intensity of illness or inability to take PO   Dispo: The patient is from: Home              Anticipated d/c is to: To be determined              Patient currently is not medically stable to d/c.   Difficult to place patient No      Medications:  Scheduled: . fentaNYL  1 patch Transdermal Q72H  . gabapentin  300 mg Oral QHS  . QUEtiapine  200 mg Oral QHS   Continuous:  EUM:[id number] **OR** acetaminophen, antiseptic oral rinse, diphenhydrAMINE, glycopyrrolate **OR** glycopyrrolate **OR** glycopyrrolate, haloperidol **OR** haloperidol **OR** haloperidol lactate, HYDROmorphone (DILAUDID) injection, lidocaine-prilocaine, LORazepam **OR** LORazepam **OR** LORazepam, morphine injection, morphine CONCENTRATE, ondansetron **  OR** ondansetron (ZOFRAN) IV, oxyCODONE, polyvinyl alcohol   Objective:  Vital Signs  Vitals:   03/18/21 1919 03/19/21 0815 03/20/21 0420 03/21/21 0518  BP: (!) 168/104 (!) 140/97 (!) 162/97 124/76  Pulse: (!) 115 (!) 119 (!) 117 (!) 113  Resp: 16 16 17 20   Temp: 97.7 F (36.5 C)  98.3 F (36.8 C) 97.7 F (36.5 C)  TempSrc:   Oral   SpO2: 93% 90% 100% 100%  Weight: 49.8 kg     Height:        Intake/Output Summary (Last 24 hours) at 03/21/2021 0836 Last data filed at 03/20/2021 1823 Gross per 24 hour  Intake 0 ml  Output --  Net 0 ml   Filed Weights   03/17/21 2045 03/18/21 1919  Weight: 50.7 kg 49.8 kg    General appearance: Somnolent but arousable.  In no distress currently. Resp: Clear to auscultation bilaterally.  Normal effort Cardio: S1-S2 is tachycardic regular.  No S3-S4.  No rubs murmurs or bruit GI: Abdomen is soft.  Nontender  nondistended.  Bowel sounds are present normal.  No masses organomegaly    Lab Results:  Data Reviewed: I have personally reviewed following labs and imaging studies  CBC: Recent Labs  Lab 03/15/21 1109 03/16/21 0314 03/17/21 0321 03/17/21 2051 03/18/21 0447  WBC 18.1* 16.5* 15.8* 16.2* 12.7*  NEUTROABS 16.9*  --   --  14.6*  --   HGB 8.5* 7.6* 8.2* 8.8* 8.1*  HCT 25.9* 23.6* 24.8* 26.6* 25.2*  MCV 84.6 84.3 83.5 83.6 84.3  PLT 97* 99* 75* 63* 49*    Basic Metabolic Panel: Recent Labs  Lab 03/15/21 1109 03/16/21 0314 03/17/21 0321 03/17/21 2051 03/18/21 0447  NA 140 140 139 135 136  K 3.6 4.0 4.2 4.0 3.5  CL 106 109 108 103 105  CO2 23 24 23 22 22   GLUCOSE 137* 143* 157* 82 80  BUN 30* 26* 24* 21* 17  CREATININE 0.86 0.72 0.61 0.70 0.54  CALCIUM 8.2* 7.7* 7.8* 7.9* 7.5*    GFR: Estimated Creatinine Clearance: 58.8 mL/min (by C-G formula based on SCr of 0.54 mg/dL).  Liver Function Tests: Recent Labs  Lab 03/15/21 1109 03/17/21 2051  AST 27 89*  ALT 27 52*  ALKPHOS 278* 295*  BILITOT 0.4 1.0  PROT 6.1* 6.0*  ALBUMIN 2.6* 2.3*    Recent Labs  Lab 03/15/21 1109 03/17/21 2051  LIPASE 50 533*   Recent Labs  Lab 03/15/21 1109  AMMONIA 23    Coagulation Profile: Recent Labs  Lab 03/16/21 0314 03/17/21 2234  INR 1.2 1.2     Recent Results (from the past 240 hour(s))  Culture, blood (routine x 2)     Status: Abnormal   Collection Time: 03/15/21 11:09 AM   Specimen: BLOOD  Result Value Ref Range Status   Specimen Description   Final    BLOOD RIGHT HAND Performed at [facility], [address] [place],  [address]    Special Requests   Final    BOTTLES DRAWN AEROBIC AND ANAEROBIC Blood Culture results may not be optimal due to an inadequate volume of blood received in culture bottles Performed at [facility], [address] [place],  [address]    Culture  Setup Time   Final    Organism ID to  follow [place] TO, READ BACK BY AND VERIFIED WITH: ABBY ELLINGTON AT 1103 03/16/21 [place] Performed at [facility], [place], [place] [address]    Culture (A)  Final    STAPHYLOCOCCUS EPIDERMIDIS THE SIGNIFICANCE OF ISOLATING THIS ORGANISM FROM A SINGLE SET OF BLOOD CULTURES WHEN MULTIPLE SETS ARE DRAWN IS UNCERTAIN. PLEASE NOTIFY THE MICROBIOLOGY DEPARTMENT WITHIN ONE WEEK IF SPECIATION AND SENSITIVITIES ARE REQUIRED. Performed at [facility], [place] [address] [place], [place] [address]    Report Status 03/17/2021 FINAL  Final  Resp Panel by RT-PCR (Flu A&B, Covid) Nasopharyngeal Swab     Status: None   Collection Time: 03/15/21 11:09 AM   Specimen: Nasopharyngeal Swab; Nasopharyngeal(NP) swabs in vial transport medium  Result Value Ref Range Status   SARS Coronavirus 2 by RT PCR NEGATIVE NEGATIVE Final    Comment: (NOTE) SARS-CoV-2 target nucleic acids are NOT DETECTED.  The SARS-CoV-2 RNA is generally detectable in upper respiratory specimens during the acute phase of infection. The lowest concentration of SARS-CoV-2 viral copies this assay can detect is 138 copies/mL. A negative result does not preclude SARS-Cov-2 infection and should not be used as the sole basis for treatment or other patient management decisions. A negative result may occur with  improper specimen collection/handling, submission of specimen other than nasopharyngeal swab, presence of viral mutation(s) within the areas targeted by this assay, and inadequate number of viral copies(<138 copies/mL). A negative result must be combined with clinical observations, patient history, and epidemiological information. The expected result is Negative.  Fact Sheet for Patients:  EntrepreneurPulse.com.au  Fact Sheet for Healthcare Providers:  IncredibleEmployment.be  This test is no t yet approved or  cleared by the [place] FDA and  has been authorized for detection and/or diagnosis of SARS-CoV-2 by FDA under an Emergency Use Authorization (EUA). This EUA will remain  in effect (meaning this test can be used) for the duration of the COVID-19 declaration under Section 564(b)(1) of the Act, 21 U.S.C.section 360bbb-3(b)(1), unless the authorization is terminated  or revoked sooner.       Influenza A by PCR NEGATIVE NEGATIVE Final   Influenza B by PCR NEGATIVE NEGATIVE Final    Comment: (NOTE) The Xpert Xpress SARS-CoV-2/FLU/RSV plus assay is intended as an aid in the diagnosis of influenza from Nasopharyngeal swab specimens and should not be used as a sole basis for treatment. Nasal washings and aspirates are unacceptable for Xpert Xpress SARS-CoV-2/FLU/RSV testing.  Fact Sheet for Patients: EntrepreneurPulse.com.au  Fact Sheet for Healthcare Providers: IncredibleEmployment.be  This test is not yet approved or cleared by the [place] FDA and has been authorized for detection and/or diagnosis of SARS-CoV-2 by FDA under an Emergency Use Authorization (EUA). This EUA will remain in effect (meaning this test can be used) for the duration of the COVID-19 declaration under Section 564(b)(1) of the Act, 21 U.S.C. section 360bbb-3(b)(1), unless the authorization is terminated or revoked.  Performed at [facility], [address] [place], [place] [address]   Blood Culture ID Panel (Reflexed)     Status: Abnormal   Collection Time: 03/15/21 11:09 AM  Result Value Ref Range Status   Enterococcus faecalis NOT DETECTED NOT DETECTED Final   Enterococcus Faecium NOT DETECTED NOT DETECTED Final   Listeria monocytogenes NOT DETECTED NOT DETECTED Final   Staphylococcus species DETECTED (A) NOT DETECTED Final    Comment: CRITICAL RESULT CALLED TO, READ BACK BY AND VERIFIED WITH:  ABBY ELLINGTON AT 1103 03/16/21 SDR    Staphylococcus  aureus (BCID) NOT DETECTED NOT DETECTED Final   Staphylococcus epidermidis DETECTED (A)  NOT DETECTED Final    Comment: Methicillin (oxacillin) resistant coagulase negative staphylococcus. Possible blood culture contaminant (unless isolated from more than one blood culture draw or clinical case suggests pathogenicity). No antibiotic treatment is indicated for blood  culture contaminants. CRITICAL RESULT CALLED TO, READ BACK BY AND VERIFIED WITH:  ABBY ELLINGTON AT 2671 03/16/21 SDR    Staphylococcus lugdunensis NOT DETECTED NOT DETECTED Final   Streptococcus species NOT DETECTED NOT DETECTED Final   Streptococcus agalactiae NOT DETECTED NOT DETECTED Final   Streptococcus pneumoniae NOT DETECTED NOT DETECTED Final   Streptococcus pyogenes NOT DETECTED NOT DETECTED Final   A.calcoaceticus-baumannii NOT DETECTED NOT DETECTED Final   Bacteroides fragilis NOT DETECTED NOT DETECTED Final   Enterobacterales NOT DETECTED NOT DETECTED Final   Enterobacter cloacae complex NOT DETECTED NOT DETECTED Final   Escherichia coli NOT DETECTED NOT DETECTED Final   Klebsiella aerogenes NOT DETECTED NOT DETECTED Final   Klebsiella oxytoca NOT DETECTED NOT DETECTED Final   Klebsiella pneumoniae NOT DETECTED NOT DETECTED Final   Proteus species NOT DETECTED NOT DETECTED Final   Salmonella species NOT DETECTED NOT DETECTED Final   Serratia marcescens NOT DETECTED NOT DETECTED Final   Haemophilus influenzae NOT DETECTED NOT DETECTED Final   Neisseria meningitidis NOT DETECTED NOT DETECTED Final   Pseudomonas aeruginosa NOT DETECTED NOT DETECTED Final   Stenotrophomonas maltophilia NOT DETECTED NOT DETECTED Final   Candida albicans NOT DETECTED NOT DETECTED Final   Candida auris NOT DETECTED NOT DETECTED Final   Candida glabrata NOT DETECTED NOT DETECTED Final   Candida krusei NOT DETECTED NOT DETECTED Final   Candida parapsilosis NOT DETECTED NOT DETECTED Final   Candida tropicalis NOT DETECTED NOT DETECTED  Final   Cryptococcus neoformans/gattii NOT DETECTED NOT DETECTED Final   Methicillin resistance mecA/C DETECTED (A) NOT DETECTED Final    Comment: CRITICAL RESULT CALLED TO, READ BACK BY AND VERIFIED WITH:  ABBY ELLINGTON AT 1103 03/16/21 SDR Performed at [facility], [address] [place], [place] [address]   Culture, blood (routine x 2)     Status: None   Collection Time: 03/15/21 12:10 PM   Specimen: BLOOD  Result Value Ref Range Status   Specimen Description BLOOD RIGHT [facility]  Final   Special Requests   Final    BOTTLES DRAWN AEROBIC AND ANAEROBIC Blood Culture adequate volume   Culture   Final    NO GROWTH 5 DAYS Performed at [facility], [address] [place], [place] [address]    Report Status 03/20/2021 FINAL  Final  MRSA PCR Screening     Status: None   Collection Time: 03/15/21  2:30 PM   Specimen: Nasal Mucosa; Nasopharyngeal  Result Value Ref Range Status   MRSA by PCR NEGATIVE NEGATIVE Final    Comment:        The GeneXpert MRSA Assay (FDA approved for NASAL specimens only), is one component of a comprehensive MRSA colonization surveillance program. It is not intended to diagnose MRSA infection nor to guide or monitor treatment for MRSA infections. Performed at [facility], [place], [place] [address]   SARS CORONAVIRUS 2 (TAT 6-24 HRS) Nasopharyngeal Nasopharyngeal Swab     Status: None   Collection Time: 03/17/21 10:34 PM   Specimen: Nasopharyngeal Swab  Result Value Ref Range Status   SARS Coronavirus 2 NEGATIVE NEGATIVE Final    Comment: (NOTE) SARS-CoV-2 target nucleic acids are NOT DETECTED.  The SARS-CoV-2 RNA is generally detectable in upper and  lower respiratory specimens during the acute phase of infection. Negative results do not preclude SARS-CoV-2 infection, do not rule out co-infections with other pathogens, and should not be used as the sole basis for treatment or other patient management  decisions. Negative results must be combined with clinical observations, patient history, and epidemiological information. The expected result is Negative.  Fact Sheet for Patients: SugarRoll.be  Fact Sheet for Healthcare Providers: https://www.woods-mathews.com/  This test is not yet approved or cleared by the [place] FDA and  has been authorized for detection and/or diagnosis of SARS-CoV-2 by FDA under an Emergency Use Authorization (EUA). This EUA will remain  in effect (meaning this test can be used) for the duration of the COVID-19 declaration under Se ction 564(b)(1) of the Act, 21 U.S.C. section 360bbb-3(b)(1), unless the authorization is terminated or revoked sooner.  Performed at [facility], [place] [address] [place], [place] [address]       Radiology Studies: No results found.     LOS: 3 days   Bambi Fehnel [employer] on [email or web address]  03/21/2021, 8:36 AM

## 2021-03-21 NOTE — Progress Notes (Addendum)
[place] [facility]) Hospital Liaison RN note:  Received request from Dr. Maryland Pink and Doran Clay, [facility] for family interest in Kelly. Chart reviewed and eligibility was approved. Spoke with sister, [name] over the phone to confirm interest and explain services. She verbalized understanding and all questions were answered. Unfortunately, [facility] is not able to offer a room today. Hospital care team is aware.  [facility] Liaison will continue to follow for room availability.   Please call with any hospice related questions or concerns.  Thank you for the opportunity to participate in this patient's care.  Zandra Abts, RN [facility] Liaison  [phone]

## 2021-03-21 NOTE — Progress Notes (Signed)
[place]  [phone] [phone]   Name: Brenda Middleton Date: 03/21/2021 MRN: [id number]  DOB: 1960-12-06  Patient Care Team: Remi Haggard, FNP as PCP - General (Family Medicine) Remi Haggard, FNP (Family Medicine) Christene Lye, MD (General Surgery) Telford Nab, RN as Oncology Nurse Navigator    REASON FOR CONSULTATION: Brenda Middleton is a 60 y.o. female with multiple medical problems including anxiety, nicotine dependence, hypertension, depression, and chronic low back pain.  Patient was recently diagnosed with stage IV non-small cell lung cancer.  MRI of the brain revealed destructive mass in the occipital bone bilaterally extending into the posterior fossa with mass-effect to the left cerebellum.  CT of the chest abdomen and pelvis revealed a large mass in the right middle lobe with widespread metastases involving lymphadenopathy in the chest, multiple pulmonary nodules, bone lesions, and liver lesions.  She was found to have a pathologic fracture of L2.  Patient was recently hospitalized 02/18/2021 -02/21/2021 with intractable low back pain.  Pain improved with dose escalation of opioids.  She was readmitted 02/27/2021 -03/08/2021 with HCAP.  Patient was again readmitted 03/15/2021 -03/17/2021 with AMS presumably due to overuse of pain medications and possible recurrent PNA. Patient is now readmitted on 03/18/2021 with hypoxic respiratory failure.  CT revealed bilateral PEs and multifocal pneumonia.  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  Palliative care was consulted help address goals and manage ongoing symptoms..   CODE STATUS: DNR  PAST MEDICAL HISTORY: Past Medical History:  Diagnosis Date  . Anxiety   . Benign neoplasm of cervix uteri   . Chronic hepatitis C without mention of hepatic coma   . Chronic low back pain 08/26/2014  .  Constipation   . Depressive disorder   . Displacement of cervical intervertebral disc   . Hypertensive disorder   . Iron deficiency anemia due to chronic blood loss 09/12/2019  . Palpitations   . Prolapsed cervical intervertebral disc     PAST SURGICAL HISTORY:  Past Surgical History:  Procedure Laterality Date  . CONIZATION CERVIX    . DILATION AND CURETTAGE OF UTERUS    . HAND SURGERY  2008,2015   Carpel Tunnel  . TONSILLECTOMY    . VULVECTOMY      HEMATOLOGY/ONCOLOGY HISTORY:  Oncology History  Malignant neoplasm of lung ([place])  02/27/2021 Initial Diagnosis   Primary malignant neoplasm of lung metastatic to other site [facility])   03/03/2021 Cancer Staging   Staging form: Lung, AJCC 8th Edition - Clinical: Stage IVB (cT4, cN3, cM1c) - Signed by Earlie Server, MD on 03/03/2021   03/14/2021 -  Chemotherapy    Patient is on Treatment Plan: LUNG NSCLC PEMETREXED (ALIMTA) / CARBOPLATIN Q21D X 1 CYCLES        ALLERGIES:  is allergic to penicillin g, penicillin v, and penicillins.  MEDICATIONS:  Current Facility-Administered Medications  Medication Dose Route Frequency Provider Last Rate Last Admin  . acetaminophen (TYLENOL) tablet 650 mg  650 mg Oral Q6H PRN Mansy, Jan A, MD       Or  . acetaminophen (TYLENOL) suppository 650 mg  650 mg Rectal Q6H PRN Mansy, Jan A, MD      . antiseptic oral rinse (BIOTENE) solution 15 mL  15 mL Topical PRN Bonnielee Haff, MD      . diphenhydrAMINE (BENADRYL) injection 12.5 mg  12.5 mg Intravenous Q4H PRN Bonnielee Haff,  MD      . fentaNYL (DURAGESIC) 25 MCG/HR 1 patch  1 patch Transdermal Q72H Mansy, Jan A, MD   1 patch at 03/21/21 1213  . gabapentin (NEURONTIN) tablet 300 mg  300 mg Oral QHS Mansy, Jan A, MD   300 mg at 03/20/21 2023  . glycopyrrolate (ROBINUL) tablet 1 mg  1 mg Oral Q4H PRN Bonnielee Haff, MD       Or  . glycopyrrolate (ROBINUL) injection 0.2 mg  0.2 mg Subcutaneous Q4H PRN Bonnielee Haff, MD       Or  . glycopyrrolate  (ROBINUL) injection 0.2 mg  0.2 mg Intravenous Q4H PRN Bonnielee Haff, MD   0.2 mg at 03/20/21 0415  . haloperidol (HALDOL) tablet 0.5 mg  0.5 mg Oral Q4H PRN Bonnielee Haff, MD       Or  . haloperidol (HALDOL) 2 MG/ML solution 0.5 mg  0.5 mg Sublingual Q4H PRN Bonnielee Haff, MD       Or  . haloperidol lactate (HALDOL) injection 0.5 mg  0.5 mg Intravenous Q4H PRN Bonnielee Haff, MD   0.5 mg at 03/20/21 0948  . HYDROmorphone (DILAUDID) injection 0.5-1 mg  0.5-1 mg Intravenous Q2H PRN Mansy, Jan A, MD   1 mg at 03/20/21 1241  . lidocaine-prilocaine (EMLA) cream 1 application  1 application Topical Daily PRN Mansy, Jan A, MD      . LORazepam (ATIVAN) tablet 1 mg  1 mg Oral Q4H PRN Bonnielee Haff, MD       Or  . LORazepam (ATIVAN) 2 MG/ML concentrated solution 1 mg  1 mg Sublingual Q4H PRN Bonnielee Haff, MD   1 mg at 03/20/21 2022   Or  . LORazepam (ATIVAN) injection 1 mg  1 mg Intravenous Q4H PRN Bonnielee Haff, MD   1 mg at 03/19/21 1529  . morphine 2 MG/ML injection 2 mg  2 mg Intravenous Q1H PRN Mansy, Jan A, MD   2 mg at 03/20/21 0032  . morphine CONCENTRATE 10 MG/0.5ML oral solution 10 mg  10 mg Sublingual Q2H PRN Mansy, Jan A, MD      . ondansetron (ZOFRAN-ODT) disintegrating tablet 4 mg  4 mg Oral Q6H PRN Bonnielee Haff, MD       Or  . ondansetron [facility]) injection 4 mg  4 mg Intravenous Q6H PRN Bonnielee Haff, MD   4 mg at 03/18/21 2009  . oxyCODONE (Oxy IR/ROXICODONE) immediate release tablet 5 mg  5 mg Oral Q4H PRN Mansy, Jan A, MD   5 mg at 03/21/21 0959  . polyvinyl alcohol (LIQUIFILM TEARS) 1.4 % ophthalmic solution 1 drop  1 drop Both Eyes QID PRN Bonnielee Haff, MD      . promethazine (PHENERGAN) tablet 12.5 mg  12.5 mg Oral Q6H PRN Bonnielee Haff, MD   12.5 mg at 03/21/21 1213  . QUEtiapine (SEROQUEL) tablet 200 mg  200 mg Oral QHS Mansy, Jan A, MD   200 mg at 03/20/21 2023    VITAL SIGNS: BP 124/76 (BP Location: Left Arm)   Pulse (!) 113   Temp 97.7 F (36.5 C)    Resp 20   Ht $R'5\' 6"'ox$  (1.676 m)   Wt 109 lb 12.6 oz (49.8 kg)   SpO2 100%   BMI 17.72 kg/m  [facility] Weights   03/17/21 2045 03/18/21 1919  Weight: 111 lb 12.4 oz (50.7 kg) 109 lb 12.6 oz (49.8 kg)    Estimated body mass index is 17.72 kg/m as calculated from the following:  Height as of this encounter: $RemoveBeforeD'5\' 6"'TfWVWmFREFauqo$  (1.676 m).   Weight as of this encounter: 109 lb 12.6 oz (49.8 kg).  LABS: CBC:    Component Value Date/Time   WBC 12.7 (H) 03/18/2021 0447   HGB 8.1 (L) 03/18/2021 0447   HGB 13.6 11/04/2013 1650   HCT 25.2 (L) 03/18/2021 0447   HCT 40.2 11/04/2013 1650   PLT 49 (L) 03/18/2021 0447   PLT 136 (L) 11/04/2013 1650   MCV 84.3 03/18/2021 0447   MCV 90 11/04/2013 1650   NEUTROABS 14.6 (H) 03/17/2021 2051   NEUTROABS 3.5 10/12/2014 1019   LYMPHSABS 1.0 03/17/2021 2051   LYMPHSABS 3.2 (H) 10/12/2014 1019   MONOABS 0.0 (L) 03/17/2021 2051   EOSABS 0.4 03/17/2021 2051   EOSABS 0.4 10/12/2014 1019   BASOSABS 0.0 03/17/2021 2051   BASOSABS 0.0 10/12/2014 1019   Comprehensive Metabolic Panel:    Component Value Date/Time   NA 136 03/18/2021 0447   NA 142 11/05/2013 0408   K 3.5 03/18/2021 0447   K 3.5 11/05/2013 0408   CL 105 03/18/2021 0447   CL 113 (H) 11/05/2013 0408   CO2 22 03/18/2021 0447   CO2 22 11/05/2013 0408   BUN 17 03/18/2021 0447   BUN 10 11/05/2013 0408   CREATININE 0.54 03/18/2021 0447   CREATININE 0.61 11/05/2013 0408   GLUCOSE 80 03/18/2021 0447   GLUCOSE 84 11/05/2013 0408   CALCIUM 7.5 (L) 03/18/2021 0447   CALCIUM 8.3 (L) 11/05/2013 0408   AST 89 (H) 03/17/2021 2051   AST 34 11/04/2013 1650   ALT 52 (H) 03/17/2021 2051   ALT 43 11/04/2013 1650   ALKPHOS 295 (H) 03/17/2021 2051   ALKPHOS 132 (H) 11/04/2013 1650   BILITOT 1.0 03/17/2021 2051   BILITOT 0.2 11/04/2013 1650   PROT 6.0 (L) 03/17/2021 2051   PROT 6.6 10/12/2014 1019   PROT 7.3 11/04/2013 1650   ALBUMIN 2.3 (L) 03/17/2021 2051   ALBUMIN 4.1 10/12/2014 1019   ALBUMIN 3.4  11/04/2013 1650    RADIOGRAPHIC STUDIES: DG Chest 2 View  Result Date: 03/05/2021 CLINICAL DATA:  Shortness of breath EXAM: CHEST - 2 VIEW COMPARISON:  02/27/2021 FINDINGS: Heart is normal size. Right upper lobe mass again noted as seen on CT. Patchy bilateral airspace disease again noted, similar to prior study. No effusions or acute bony abnormality. IMPRESSION: Patchy bilateral airspace disease again noted, not significantly changed. Right upper lobe mass again seen. Electronically Signed   By: Rolm Baptise M.D.   On: 03/05/2021 21:42   CT CHEST W CONTRAST  Result Date: 03/06/2021 CLINICAL DATA:  Respiratory failure, shortness of breath EXAM: CT CHEST WITH CONTRAST TECHNIQUE: Multidetector CT imaging of the chest was performed during intravenous contrast administration. CONTRAST:  26mL OMNIPAQUE IOHEXOL 300 MG/ML  SOLN COMPARISON:  02/27/2021.  Chest x-ray 03/05/2021 FINDINGS: Cardiovascular: Heart is normal size. Coronary artery and aortic calcifications. No aneurysm. Mediastinum/Nodes: Bulky mediastinal and bilateral hilar adenopathy again noted, unchanged. Trachea is patent. Thyroid unremarkable. Lungs/Pleura: Ground-glass airspace opacities in the lower lobes have worsened since prior study, particularly in the left lower lobe. Ground-glass opacities in the upper lobes somewhat improved since prior study. Numerous bilateral pulmonary nodules are stable. Right upper lobe mass again noted and stable. No effusions. Upper Abdomen: Imaging into the upper abdomen demonstrates no acute findings. Musculoskeletal: Chest wall soft tissues are unremarkable. Multiple osseous metastases again noted, unchanged. IMPRESSION: Redemonstrated is extensive metastatic disease in the chest with numerous pulmonary nodules, dominant  right upper lobe mass, and bulky mediastinal/bilateral hilar adenopathy. Ground-glass opacities have increased in the lower lobes, left greater than right, likely infectious/inflammatory.  Somewhat improvement in ground-glass opacities in the upper lobe since prior study. Stable osseous metastatic disease. Coronary artery disease. Aortic Atherosclerosis (ICD10-I70.0). Electronically Signed   By: Rolm Baptise M.D.   On: 03/06/2021 00:11   CT Angio Chest PE W/Cm &/Or Wo Cm  Addendum Date: 03/17/2021   ADDENDUM REPORT: 03/17/2021 23:51 ADDENDUM: Critical Value/emergent results were called by telephone at the time of interpretation on 03/17/2021 at 11:46 pm to provider [facility] , who verbally acknowledged these results. Electronically Signed   By: Ulyses Jarred M.D.   On: 03/17/2021 23:51   Result Date: 03/17/2021 CLINICAL DATA:  Stage IV metastatic lung carcinoma. Discharged from ICU today. Lower abdominal pain after enema. EXAM: CT ANGIOGRAPHY CHEST CT ABDOMEN AND PELVIS WITH CONTRAST TECHNIQUE: Multidetector CT imaging of the chest was performed using the standard protocol during bolus administration of intravenous contrast. Multiplanar CT image reconstructions and MIPs were obtained to evaluate the vascular anatomy. Multidetector CT imaging of the abdomen and pelvis was performed using the standard protocol during bolus administration of intravenous contrast. CONTRAST:  84mL OMNIPAQUE IOHEXOL 350 MG/ML SOLN COMPARISON:  None. FINDINGS: CTA CHEST FINDINGS Cardiovascular: Contrast injection is sufficient to demonstrate satisfactory opacification of the pulmonary arteries to the segmental level.There are filling defects within the proximal right middle lobar artery and within the posterior segmental branches of the right lower lobe. On the left, there are filling defects within the left lower lobe segmental branches. The main pulmonary artery is within normal limits for size. There is no CT evidence of acute right heart strain. Mild calcific aortic atherosclerosis. Aorta is otherwise normal. There is a normal 3-vessel arch branching pattern. Heart size is normal, without pericardial effusion.  Mediastinum/Nodes: Unchanged size of right hilar mass measuring 4.3 x 3.1 cm. Left hilar nodes are unchanged. Confluent pretracheal adenopathy also unchanged. Lungs/Pleura: Widespread ground-glass opacities are new since the prior study. Right middle lobe nodule (image 48) has decreased in size measuring 2.0 x 1.7 cm, previously 2.6 x 2.7 cm. There are small bilateral pleural effusions. 4 mm nodule in the left upper lobe is unchanged (image 42). Musculoskeletal: Sclerotic focus in the right aspect of the T5 vertebral body has markedly increased in size. Lytic lesion at T9 is unchanged. T7 lucent lesion is also unchanged. Review of the MIP images confirms the above findings. CT ABDOMEN and PELVIS FINDINGS Hepatobiliary: Unchanged hypodensity in the left hepatic lobe (2:12 measuring 12 mm. There is faint peripheral enhancement. There is also a small area of hyperenhancement more laterally at the same level (see aero). This is at the site of the hypodense focus seen on the previous CT. Subcapsular hypodensity with peripheral enhancement is unchanged (2:20). Status post cholecystectomy. Pancreas: Normal contours without ductal dilatation. No peripancreatic fluid collection. Spleen: Normal. Adrenals/Urinary Tract: --Adrenal glands: Normal. 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 --Left kidney/ureter: Proximal left ureter is dilated. There is focal hypoattenuation at the lower pole of the left kidney with suspected tumor thrombus in the lower branches of the left renal artery. --Urinary bladder: Dilated Stomach/Bowel: --Stomach/Duodenum: No hiatal hernia or other gastric abnormality. Normal duodenal course and caliber. --Small bowel: No dilatation or inflammation. --Colon: No focal abnormality. --Appendix: Normal. 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 There is calcific  atherosclerosis  of the aorta. The portal vein, splenic vein and superior mesenteric vein are patent. The IVC is patent. There is a 12 mm retrocaval lymph node, unchanged. Multiple subcentimeter retroperitoneal nodes. Reproductive: There is a small amount of free fluid in the pelvis. Normal uterus. No adnexal mass. Musculoskeletal. Mixed density appearance of L2 is unchanged. Small sclerotic focus within the S1 body has slightly increased in size. Other: None. IMPRESSION: 1. Acute pulmonary emboli within multiple bilateral lobar and segmental branches. No CT evidence of acute right heart strain. 2. Widespread ground-glass opacities, new since the prior study. This could indicate infection or post treatment change. Lymphangitic tumor spread is also possible. New small bilateral pleural effusions. 3. Unchanged size of right hilar mass and mediastinal lymphadenopathy. Right middle lobe dominant nodule has decreased in size. 4. 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 Unchanged appearance of liver metastases allowing for differences in contrast timing. 6. Multiple osseous metastases, worsened clearly at T5. Aortic Atherosclerosis (ICD10-I70.0). Electronically Signed: By: Ulyses Jarred M.D. On: 03/17/2021 23:39   CT Angio Chest PE W and/or Wo Contrast  Result Date: 02/27/2021 CLINICAL DATA:  PE suspected, widely metastatic lung cancer EXAM: CT ANGIOGRAPHY CHEST WITH CONTRAST TECHNIQUE: Multidetector CT imaging of the chest was performed using the standard protocol during bolus administration of intravenous contrast. Multiplanar CT image reconstructions and MIPs were obtained to evaluate the vascular anatomy. CONTRAST:  26mL OMNIPAQUE IOHEXOL 350 MG/ML SOLN COMPARISON:  02/15/2021 FINDINGS: Cardiovascular: Satisfactory opacification of the pulmonary arteries to the segmental level. No evidence of pulmonary embolism. Cardiomegaly. Left and right coronary artery  calcifications and stents. No pericardial effusion. Mediastinum/Nodes: Redemonstrated, very extensive bulky bilateral hilar, mediastinal, supraclavicular, and left axillary lymphadenopathy. Thyroid gland, trachea, and esophagus demonstrate no significant findings. Lungs/Pleura: Diffuse bilateral bronchial wall thickening. Spiculated right upper lobe mass as seen on prior examination (series 7, image 42). Numerous redemonstrated small pulmonary nodules throughout the lungs. There is new, extensive ground-glass airspace opacity throughout the lungs, somewhat geographic in the upper lobes (series 7, image 33), although somewhat more nodular appearing in the lower lungs (series 7, image 58). No pleural effusion or pneumothorax. Upper Abdomen: No acute abnormality. Multiple low-attenuation lesions of liver, better assessed by prior dedicated of the abdomen. Musculoskeletal: No chest wall abnormality. Multiple osseous metastatic lesions, most significantly a lytic lesion of the T9 vertebral body (series 9, image 58). Review of the MIP images confirms the above findings. IMPRESSION: 1. Negative examination for pulmonary embolism. 2. There is new, extensive ground-glass airspace opacity throughout the lungs, somewhat geographic in the upper lobes, although somewhat more nodular appearing in the lower lungs. Findings are nonspecific and infectious or inflammatory, differential considerations primarily include drug toxicity and atypical/viral infection. 3. Diffuse bilateral bronchial wall thickening, consistent with nonspecific infectious or inflammatory bronchitis. 4. Redemonstrated findings of advanced metastatic lung malignancy, better assessed by recent prior staging examination. 5. Coronary artery disease. Electronically Signed   By: Eddie Candle M.D.   On: 02/27/2021 10:49   NM Bone Scan Whole Body  Result Date: 02/27/2021 CLINICAL DATA:  Metastatic disease evaluation, lung mass, abnormal CT EXAM: NUCLEAR MEDICINE  WHOLE BODY BONE SCAN TECHNIQUE: Whole body anterior and posterior images were obtained approximately 3 hours after intravenous injection of radiopharmaceutical. RADIOPHARMACEUTICALS:  19.79 mCi Technetium-45m MDP IV COMPARISON:  None Correlation: CT chest abdomen pelvis 02/15/2021, CT thoracic and lumbar spine 02/18/2021, CT head 02/15/2021 FINDINGS: Multiple sites of abnormal tracer uptake are identified consistent  with widespread osseous metastatic disease. These include calvaria greatest at occipital bone growth more on LEFT, thoracic and lumbar spine, RIGHT ribs, and pelvis. Dextroconvex thoracolumbar scoliosis. No definite abnormal tracer uptake within the humeri or femora. Expected urinary tract and soft tissue distribution of tracer. IMPRESSION: Scattered osseous metastases as above. Electronically Signed   By: Lavonia Dana M.D.   On: 02/27/2021 18:17   CT Abdomen Pelvis W Contrast  Addendum Date: 03/17/2021   ADDENDUM REPORT: 03/17/2021 23:51 ADDENDUM: Critical Value/emergent results were called by telephone at the time of interpretation on 03/17/2021 at 11:46 pm to provider [facility] , who verbally acknowledged these results. Electronically Signed   By: Ulyses Jarred M.D.   On: 03/17/2021 23:51   Result Date: 03/17/2021 CLINICAL DATA:  Stage IV metastatic lung carcinoma. Discharged from ICU today. Lower abdominal pain after enema. EXAM: CT ANGIOGRAPHY CHEST CT ABDOMEN AND PELVIS WITH CONTRAST TECHNIQUE: Multidetector CT imaging of the chest was performed using the standard protocol during bolus administration of intravenous contrast. Multiplanar CT image reconstructions and MIPs were obtained to evaluate the vascular anatomy. Multidetector CT imaging of the abdomen and pelvis was performed using the standard protocol during bolus administration of intravenous contrast. CONTRAST:  73mL OMNIPAQUE IOHEXOL 350 MG/ML SOLN COMPARISON:  None. FINDINGS: CTA CHEST FINDINGS Cardiovascular: Contrast injection  is sufficient to demonstrate satisfactory opacification of the pulmonary arteries to the segmental level.There are filling defects within the proximal right middle lobar artery and within the posterior segmental branches of the right lower lobe. On the left, there are filling defects within the left lower lobe segmental branches. The main pulmonary artery is within normal limits for size. There is no CT evidence of acute right heart strain. Mild calcific aortic atherosclerosis. Aorta is otherwise normal. There is a normal 3-vessel arch branching pattern. Heart size is normal, without pericardial effusion. Mediastinum/Nodes: Unchanged size of right hilar mass measuring 4.3 x 3.1 cm. Left hilar nodes are unchanged. Confluent pretracheal adenopathy also unchanged. Lungs/Pleura: Widespread ground-glass opacities are new since the prior study. Right middle lobe nodule (image 48) has decreased in size measuring 2.0 x 1.7 cm, previously 2.6 x 2.7 cm. There are small bilateral pleural effusions. 4 mm nodule in the left upper lobe is unchanged (image 42). Musculoskeletal: Sclerotic focus in the right aspect of the T5 vertebral body has markedly increased in size. Lytic lesion at T9 is unchanged. T7 lucent lesion is also unchanged. Review of the MIP images confirms the above findings. CT ABDOMEN and PELVIS FINDINGS Hepatobiliary: Unchanged hypodensity in the left hepatic lobe (2:12 measuring 12 mm. There is faint peripheral enhancement. There is also a small area of hyperenhancement more laterally at the same level (see aero). This is at the site of the hypodense focus seen on the previous CT. Subcapsular hypodensity with peripheral enhancement is unchanged (2:20). Status post cholecystectomy. Pancreas: Normal contours without ductal dilatation. No peripancreatic fluid collection. Spleen: Normal. Adrenals/Urinary Tract: --Adrenal glands: Normal. --Right kidney/ureter: Interpolar right kidney is hypodense and expanded with  suspected tumor thrombus throughout the distal right renal artery. --Left kidney/ureter: Proximal left ureter is dilated. There is focal hypoattenuation at the lower pole of the left kidney with suspected tumor thrombus in the lower branches of the left renal artery. --Urinary bladder: Dilated Stomach/Bowel: --Stomach/Duodenum: No hiatal hernia or other gastric abnormality. Normal duodenal course and caliber. --Small bowel: No dilatation or inflammation. --Colon: No focal abnormality. --Appendix: Normal. 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 There  is calcific atherosclerosis of the aorta. The portal vein, splenic vein and superior mesenteric vein are patent. The IVC is patent. There is a 12 mm retrocaval lymph node, unchanged. Multiple subcentimeter retroperitoneal nodes. Reproductive: There is a small amount of free fluid in the pelvis. Normal uterus. No adnexal mass. Musculoskeletal. Mixed density appearance of L2 is unchanged. Small sclerotic focus within the S1 body has slightly increased in size. Other: None. IMPRESSION: 1. Acute pulmonary emboli within multiple bilateral lobar and segmental branches. No CT evidence of acute right heart strain. 2. Widespread ground-glass opacities, new since the prior study. This could indicate infection or post treatment change. Lymphangitic tumor spread is also possible. New small bilateral pleural effusions. 3. Unchanged size of right hilar mass and mediastinal lymphadenopathy. Right middle lobe dominant nodule has decreased in size. 4. 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 Unchanged appearance of liver metastases allowing for differences in contrast timing. 6. Multiple osseous metastases, worsened clearly at T5. Aortic Atherosclerosis (ICD10-I70.0). Electronically Signed: By: Ulyses Jarred M.D. On: 03/17/2021 23:39   DG Chest Portable 1 View  Result Date:  03/17/2021 CLINICAL DATA:  Shortness of breath EXAM: PORTABLE CHEST 1 VIEW COMPARISON:  03/15/2021 FINDINGS: Worsening bilateral opacities throughout both lungs. No pneumothorax or sizable pleural effusion. Normal cardiomediastinal contours. IMPRESSION: Worsening bilateral airspace opacities. Electronically Signed   By: Ulyses Jarred M.D.   On: 03/17/2021 21:23   DG Chest Portable 1 View  Result Date: 03/15/2021 CLINICAL DATA:  Shortness of breath EXAM: PORTABLE CHEST 1 VIEW COMPARISON:  03/07/2021 FINDINGS: Cardiac shadow is stable. Patchy airspace opacities are again identified slightly improved when compared with the prior exam. No new focal infiltrate is seen. No bony abnormality is noted. IMPRESSION: Slight improvement in previously seen airspace opacities bilaterally. Electronically Signed   By: Inez Catalina M.D.   On: 03/15/2021 11:38   DG Chest Portable 1 View  Result Date: 03/07/2021 CLINICAL DATA:  Shortness of breath EXAM: PORTABLE CHEST 1 VIEW COMPARISON:  03/05/2021 FINDINGS: Patchy bilateral airspace disease throughout the lungs, slightly worsened since prior study. Nodular areas within the lungs, stable since prior study. Heart is normal size. No effusions or acute bony abnormality. IMPRESSION: Worsening patchy bilateral airspace disease, likely worsening pneumonia. Scattered nodules unchanged. Electronically Signed   By: Rolm Baptise M.D.   On: 03/07/2021 21:41   DG Chest Port 1 View  Result Date: 02/27/2021 CLINICAL DATA:  Worsening shortness of breath since this morning. Recent diagnosis of lung cancer. EXAM: PORTABLE CHEST 1 VIEW COMPARISON:  Radiograph earlier today.  CT earlier today. FINDINGS: Stable heart size and mediastinal contours. Right hilar prominence and thickening of the lower paratracheal stripe related to known adenopathy. Unchanged right mid lung pulmonary nodule. Mild patchy bilateral suprahilar opacities corresponding to ground-glass opacity on CT earlier today. No  significant change in the interim. No pneumothorax or large pleural effusion. Retained excreted IV contrast in the right renal collecting system with right hydronephrosis, partially included in the upper abdomen. IMPRESSION: 1. Stable radiographic appearance of the chest from earlier today. Similar patchy suprahilar opacities corresponding to ground-glass opacity on CT. 2. Right mid lung pulmonary nodule and thoracic adenopathy. 3. Partially included in the upper abdomen is retained excreted contrast in the right renal collecting system with right hydronephrosis. Right hydronephrosis was seen on renal ultrasound 02/18/2021. Electronically Signed   By: Keith Rake M.D.   On: 02/27/2021 15:04   DG Chest Portable 1 View  Result Date: 02/27/2021 CLINICAL DATA:  60 year old female with shortness of breath and cough. Metastatic lung cancer. EXAM: PORTABLE CHEST 1 VIEW COMPARISON:  02/15/2021 and prior studies FINDINGS: New hazy opacities within the central/upper lungs noted. RIGHT middle lobe mass and small scattered pulmonary nodules bilaterally are again identified. No pleural effusion or pneumothorax. No acute bony abnormalities are identified. IMPRESSION: New hazy opacities within the central/upper lungs which may represent edema or infection. Unchanged RIGHT middle lobe mass and bilateral pulmonary nodules compatible with known malignancy/metastases. Electronically Signed   By: Margarette Canada M.D.   On: 02/27/2021 08:49   [place] CORE BIOPSY (LYMPH NODES)  Result Date: 02/21/2021 INDICATION: Multiple enlarged lymph nodes, right middle lobe lung mass, liver lesions and bone lesions. EXAM: ULTRASOUND GUIDED CORE BIOPSY OF LEFT SUPRACLAVICULAR LYMPH NODE MEDICATIONS: None. ANESTHESIA/SEDATION: Versed 2.0 mg IV Moderate Sedation Time:  12 minutes. The patient was continuously monitored during the procedure by the interventional radiology nurse under my direct supervision. PROCEDURE: The procedure, risks, benefits,  and alternatives were explained to the patient. Questions regarding the procedure were encouraged and answered. The patient understands and consents to the procedure. A time-out was performed prior to initiating the procedure. The left neck was prepped with chlorhexidine in a sterile fashion, and a sterile drape was applied covering the operative field. A sterile gown and sterile gloves were used for the procedure. Local anesthesia was provided with 1% Lidocaine. After localizing an enlarged left supraclavicular lymph node by ultrasound, multiple 18 gauge core biopsy samples were obtained. Core biopsy samples were submitted in formalin as well as on saline soaked Telfa gauze. Additional ultrasound was performed. COMPLICATIONS: None immediate. FINDINGS: An enlarged left supraclavicular lymph node measures approximately 3.0 x 1.8 x 2.9 cm. Solid core biopsy samples were obtained. IMPRESSION: Ultrasound-guided core biopsy performed of an enlarged left supraclavicular lymph node measuring 3 cm in greatest dimensions. Electronically Signed   By: Aletta Edouard M.D.   On: 02/21/2021 15:31    PERFORMANCE STATUS (ECOG) : 4 - Bedbound  Review of Systems Unless otherwise noted, a complete review of systems is negative.  Physical Exam General: NAD, frail appearing Pulmonary: unlabored, on O2 Extremities: no edema, no joint deformities Skin: no rashes Neurological: Weakness but otherwise nonfocal  IMPRESSION: Patient was transitioned to comfort care over the weekend.  Today, her mentation has improved.  She is currently alert and oriented.  Both sisters are in the room.  I met with patient and family to discuss goals.  I reviewed with patient her clinical course to date and we discussed her overall poor prognosis and likely limited options for ongoing treatment.  Patient is currently on the wait list for hospice facility.  Her sisters still feel hospice is the best option at the present time.  Patient says  she will think about it.  PLAN: -Recommend continued comfort care -Monserrate when a bed is available  Case and plan discussed with Dr. Tasia Catchings  Time Total: 20 minutes  Visit consisted of counseling and education dealing with the complex and emotionally intense issues of symptom management and palliative care in the setting of serious and potentially life-threatening illness.Greater than 50%  of this time was spent counseling and coordinating care related to the above assessment and plan.  Signed by: Altha Harm, PhD, NP-C

## 2021-03-21 NOTE — Progress Notes (Signed)
Patient increasingly more alert with family at bedside, increased PO intake this shift. RN assuming care aware. NO IV access needed per MD. Orma Flaming, RN

## 2021-03-21 NOTE — Progress Notes (Signed)
Hematology/Oncology Progress Note [facility] Telephone:([phone] [phone]  Patient Care Team: Remi Haggard, FNP as PCP - General (Family Medicine) Remi Haggard, FNP (Family Medicine) Christene Lye, MD (General Surgery) Telford Nab, RN as Oncology Nurse Navigator   Name of the patient: Brenda Middleton  [id number]  1961/03/13  Date of visit: 03/21/21   INTERVAL HISTORY-  Patient has agreed on comfort care/hospice. Sister Brenda Middleton and Brenda Middleton are at bedside today.  She appears comfortable. Heparin has been stopped due to thrombocytopenia due to chemotherapy. No additional blood tests were obtained after 4/15.    Review of systems- Review of Systems  Unable to perform ROS: Severe respiratory distress  She denies any pain.  She wants her legs to be elevated to be more comfortable.  Allergies  Allergen Reactions  . Penicillin G Hives    Other reaction(s): HIVES Other reaction(s): HIVES  . Penicillin V Itching  . Penicillins     Patient Active Problem List   Diagnosis Date Noted  . Acute pulmonary embolism ([place]) 03/18/2021  . Metastatic lung cancer (metastasis from lung to other site) ([place]) 03/18/2021  . Primary malignant neoplasm of lung metastatic to other site [facility])   . 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 Primary malignant neoplasm of right lung metastatic to other site [facility])   . Anemia associated with chemotherapy   . Opiate overdose ([place])   . Thrombocytopenia ([place]) 03/15/2021  . Hypokalemia 03/15/2021  . Hypomagnesemia 03/15/2021  . Acute respiratory failure with hypoxia ([place]) 03/07/2021  . Postobstructive pneumonia 02/27/2021  . HTN (hypertension) 02/27/2021  . Depression with anxiety 02/27/2021  . Normocytic anemia 02/27/2021  . Sepsis ([place]) 02/27/2021  . Protein-calorie malnutrition, moderate ([place]) 02/27/2021  . Malignant neoplasm of lung ([place]) 02/27/2021  . Elevated troponin 02/27/2021  . Toxic metabolic  encephalopathy 35/70/1779  . Brain metastasis ([place]) 02/27/2021  . Shortness of breath   . Metastatic malignant neoplasm ([place])   . Intractable low back pain 02/18/2021  . Anxiety   . Depressive disorder   . Pathologic fracture of lumbar vertebra, initial encounter   . 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 COPD exacerbation ([place])   . GERD (gastroesophageal reflux disease)   . Palliative care encounter   . Pathologic compression fracture of lumbar vertebra ([place])   . Goals of care, counseling/discussion   . Neoplasm related pain   . Iron deficiency anemia due to chronic blood loss 09/12/2019  . Family history of cancer 09/12/2019  . Blood in the stool 09/12/2019  . Elevated alkaline phosphatase level 09/12/2019  . Chronic low back pain 08/26/2014     Past Medical History:  Diagnosis Date  . Anxiety   . Benign neoplasm of cervix uteri   . Chronic hepatitis C without mention of hepatic coma   . Chronic low back pain 08/26/2014  . Constipation   . Depressive disorder   . Displacement of cervical intervertebral disc   . Hypertensive disorder   . Iron deficiency anemia due to chronic blood loss 09/12/2019  . Palpitations   . Prolapsed cervical intervertebral disc      Past Surgical History:  Procedure Laterality Date  . CONIZATION CERVIX    . DILATION AND CURETTAGE OF UTERUS    . HAND SURGERY  2008,2015   Carpel Tunnel  . TONSILLECTOMY    . VULVECTOMY      Social History   Socioeconomic History  . Marital status: Single  Spouse name: Not on file  . Number of children: 0  . Years of education: college1  . Highest education level: Not on file  Occupational History    Employer: UNEMPLOYED  Tobacco Use  . Smoking status: Former Smoker    Packs/day: 0.70    Years: 35.00    Pack years: 24.50    Types: Cigarettes  . Smokeless tobacco: Never Used  Vaping Use  . Vaping Use: Never used  Substance and Sexual Activity  . Alcohol use: Yes    Comment: occasional wine   . Drug use: No  . Sexual activity: Not Currently  Other Topics Concern  . Not on file  Social History Narrative  . Not on file   Social Determinants of Health   Financial Resource Strain: Not on file  Food Insecurity: Not on file  Transportation Needs: Not on file  Physical Activity: Not on file  Stress: Not on file  Social Connections: Not on file  Intimate Partner Violence: Not on file     Family History  Problem Relation Age of Onset  . Breast cancer Mother 59  . Lung cancer Father   . Cancer Brother   . Cancer Other   . Stroke Other      Current Facility-Administered Medications:  .  acetaminophen (TYLENOL) tablet 650 mg, 650 mg, Oral, Q6H PRN **OR** acetaminophen (TYLENOL) suppository 650 mg, 650 mg, Rectal, Q6H PRN, Mansy, Jan A, MD .  antiseptic oral rinse (BIOTENE) solution 15 mL, 15 mL, Topical, PRN, Bonnielee Haff, MD .  diphenhydrAMINE (BENADRYL) injection 12.5 mg, 12.5 mg, Intravenous, Q4H PRN, Bonnielee Haff, MD .  fentaNYL (DURAGESIC) 25 MCG/HR 1 patch, 1 patch, Transdermal, Q72H, Mansy, Jan A, MD, 1 patch at 03/21/21 1213 .  gabapentin (NEURONTIN) tablet 300 mg, 300 mg, Oral, QHS, Mansy, Jan A, MD, 300 mg at 03/20/21 2023 .  glycopyrrolate (ROBINUL) tablet 1 mg, 1 mg, Oral, Q4H PRN **OR** glycopyrrolate (ROBINUL) injection 0.2 mg, 0.2 mg, Subcutaneous, Q4H PRN **OR** glycopyrrolate (ROBINUL) injection 0.2 mg, 0.2 mg, Intravenous, Q4H PRN, Bonnielee Haff, MD, 0.2 mg at 03/20/21 0415 .  haloperidol (HALDOL) tablet 0.5 mg, 0.5 mg, Oral, Q4H PRN **OR** haloperidol (HALDOL) 2 MG/ML solution 0.5 mg, 0.5 mg, Sublingual, Q4H PRN **OR** haloperidol lactate (HALDOL) injection 0.5 mg, 0.5 mg, Intravenous, Q4H PRN, Bonnielee Haff, MD, 0.5 mg at 03/20/21 0948 .  HYDROmorphone (DILAUDID) injection 0.5-1 mg, 0.5-1 mg, Intravenous, Q2H PRN, Mansy, Jan A, MD, 1 mg at 03/20/21 1241 .  lidocaine-prilocaine (EMLA) cream 1 application, 1 application, Topical, Daily PRN, Mansy,  Jan A, MD .  LORazepam (ATIVAN) tablet 1 mg, 1 mg, Oral, Q4H PRN **OR** LORazepam (ATIVAN) 2 MG/ML concentrated solution 1 mg, 1 mg, Sublingual, Q4H PRN, 1 mg at 03/20/21 2022 **OR** LORazepam (ATIVAN) injection 1 mg, 1 mg, Intravenous, Q4H PRN, Bonnielee Haff, MD, 1 mg at 03/19/21 1529 .  morphine 2 MG/ML injection 2 mg, 2 mg, Intravenous, Q1H PRN, Mansy, Jan A, MD, 2 mg at 03/20/21 0032 .  morphine CONCENTRATE 10 MG/0.5ML oral solution 10 mg, 10 mg, Sublingual, Q2H PRN, Mansy, Jan A, MD .  ondansetron (ZOFRAN-ODT) disintegrating tablet 4 mg, 4 mg, Oral, Q6H PRN **OR** ondansetron (ZOFRAN) injection 4 mg, 4 mg, Intravenous, Q6H PRN, Bonnielee Haff, MD, 4 mg at 03/18/21 2009 .  oxyCODONE (Oxy IR/ROXICODONE) immediate release tablet 5 mg, 5 mg, Oral, Q4H PRN, Mansy, Jan A, MD, 5 mg at 03/21/21 0959 .  polyvinyl alcohol (LIQUIFILM TEARS) 1.4 %  ophthalmic solution 1 drop, 1 drop, Both Eyes, QID PRN, Bonnielee Haff, MD .  promethazine (PHENERGAN) tablet 12.5 mg, 12.5 mg, Oral, Q6H PRN, Bonnielee Haff, MD, 12.5 mg at 03/21/21 1213 .  QUEtiapine (SEROQUEL) tablet 200 mg, 200 mg, Oral, QHS, Mansy, Jan A, MD, 200 mg at 03/20/21 2023   Physical exam:  Vitals:   03/18/21 1919 03/19/21 0815 03/20/21 0420 03/21/21 0518  BP: (!) 168/104 (!) 140/97 (!) 162/97 124/76  Pulse: (!) 115 (!) 119 (!) 117 (!) 113  Resp: 16 16 17 20   Temp: 97.7 F (36.5 C)  98.3 F (36.8 C) 97.7 F (36.5 C)  TempSrc:   Oral   SpO2: 93% 90% 100% 100%  Weight: 109 lb 12.6 oz (49.8 kg)     Height:       Physical Exam Constitutional:      General: She is not in acute distress.    Appearance: She is ill-appearing. She is not diaphoretic.  HENT:     Head: Normocephalic and atraumatic.     Nose: Nose normal.     Mouth/Throat:     Pharynx: No oropharyngeal exudate.  Eyes:     General: No scleral icterus.    Pupils: Pupils are equal, round, and reactive to light.  Cardiovascular:     Rate and Rhythm: Normal rate and  regular rhythm.     Heart sounds: No murmur heard.   Pulmonary:     Effort: Pulmonary effort is normal. No respiratory distress.     Breath sounds: No rales.     Comments: Bilateral crackles Chest:     Chest wall: No tenderness.  Abdominal:     General: There is no distension.     Palpations: Abdomen is soft.     Tenderness: There is no abdominal tenderness.  Musculoskeletal:        General: Normal range of motion.     Cervical back: Normal range of motion and neck supple.  Skin:    General: Skin is warm and dry.     Findings: No erythema.     Comments: Multiple areas of bruising/ecchymosis  Neurological:     Mental Status: She is alert and oriented to person, place, and time.     Cranial Nerves: No cranial nerve deficit.     Motor: No abnormal muscle tone.     Coordination: Coordination normal.     Comments: Drowsy, she answers simple questions and responds to verbal stimuli.  Psychiatric:        Mood and Affect: Affect normal.        CMP Latest Ref Rng & Units 03/18/2021  Glucose 70 - 99 mg/dL 80  BUN 6 - 20 mg/dL 17  Creatinine 0.44 - 1.00 mg/dL 0.54  Sodium 135 - 145 mmol/L 136  Potassium 3.5 - 5.1 mmol/L 3.5  Chloride 98 - 111 mmol/L 105  CO2 22 - 32 mmol/L 22  Calcium 8.9 - 10.3 mg/dL 7.5(L)  Total Protein 6.5 - 8.1 g/dL -  Total Bilirubin 0.3 - 1.2 mg/dL -  Alkaline Phos 38 - 126 U/L -  AST 15 - 41 U/L -  ALT 0 - 44 U/L -   CBC Latest Ref Rng & Units 03/18/2021  WBC 4.0 - 10.5 K/uL 12.7(H)  Hemoglobin 12.0 - 15.0 g/dL 8.1(L)  Hematocrit 36.0 - 46.0 % 25.2(L)  Platelets 150 - 400 K/uL 49(L)    RADIOGRAPHIC STUDIES: I have personally reviewed the radiological images as listed and agreed with the  findings in the report. DG Chest 2 View  Result Date: 03/05/2021 CLINICAL DATA:  Shortness of breath EXAM: CHEST - 2 VIEW COMPARISON:  02/27/2021 FINDINGS: Heart is normal size. Right upper lobe mass again noted as seen on CT. Patchy bilateral airspace disease  again noted, similar to prior study. No effusions or acute bony abnormality. IMPRESSION: Patchy bilateral airspace disease again noted, not significantly changed. Right upper lobe mass again seen. Electronically Signed   By: Rolm Baptise M.D.   On: 03/05/2021 21:42   CT CHEST W CONTRAST  Result Date: 03/06/2021 CLINICAL DATA:  Respiratory failure, shortness of breath EXAM: CT CHEST WITH CONTRAST TECHNIQUE: Multidetector CT imaging of the chest was performed during intravenous contrast administration. CONTRAST:  15mL OMNIPAQUE IOHEXOL 300 MG/ML  SOLN COMPARISON:  02/27/2021.  Chest x-ray 03/05/2021 FINDINGS: Cardiovascular: Heart is normal size. Coronary artery and aortic calcifications. No aneurysm. Mediastinum/Nodes: Bulky mediastinal and bilateral hilar adenopathy again noted, unchanged. Trachea is patent. Thyroid unremarkable. Lungs/Pleura: Ground-glass airspace opacities in the lower lobes have worsened since prior study, particularly in the left lower lobe. Ground-glass opacities in the upper lobes somewhat improved since prior study. Numerous bilateral pulmonary nodules are stable. Right upper lobe mass again noted and stable. No effusions. Upper Abdomen: Imaging into the upper abdomen demonstrates no acute findings. Musculoskeletal: Chest wall soft tissues are unremarkable. Multiple osseous metastases again noted, unchanged. IMPRESSION: Redemonstrated is extensive metastatic disease in the chest with numerous pulmonary nodules, dominant right upper lobe mass, and bulky mediastinal/bilateral hilar adenopathy. Ground-glass opacities have increased in the lower lobes, left greater than right, likely infectious/inflammatory. Somewhat improvement in ground-glass opacities in the upper lobe since prior study. Stable osseous metastatic disease. Coronary artery disease. Aortic Atherosclerosis (ICD10-I70.0). Electronically Signed   By: Rolm Baptise M.D.   On: 03/06/2021 00:11   CT Angio Chest PE W/Cm &/Or Wo  Cm  Addendum Date: 03/17/2021   ADDENDUM REPORT: 03/17/2021 23:51 ADDENDUM: Critical Value/emergent results were called by telephone at the time of interpretation on 03/17/2021 at 11:46 pm to provider [facility] , who verbally acknowledged these results. Electronically Signed   By: Ulyses Jarred M.D.   On: 03/17/2021 23:51   Result Date: 03/17/2021 CLINICAL DATA:  Stage IV metastatic lung carcinoma. Discharged from ICU today. Lower abdominal pain after enema. EXAM: CT ANGIOGRAPHY CHEST CT ABDOMEN AND PELVIS WITH CONTRAST TECHNIQUE: Multidetector CT imaging of the chest was performed using the standard protocol during bolus administration of intravenous contrast. Multiplanar CT image reconstructions and MIPs were obtained to evaluate the vascular anatomy. Multidetector CT imaging of the abdomen and pelvis was performed using the standard protocol during bolus administration of intravenous contrast. CONTRAST:  67mL OMNIPAQUE IOHEXOL 350 MG/ML SOLN COMPARISON:  None. FINDINGS: CTA CHEST FINDINGS Cardiovascular: Contrast injection is sufficient to demonstrate satisfactory opacification of the pulmonary arteries to the segmental level.There are filling defects within the proximal right middle lobar artery and within the posterior segmental branches of the right lower lobe. On the left, there are filling defects within the left lower lobe segmental branches. The main pulmonary artery is within normal limits for size. There is no CT evidence of acute right heart strain. Mild calcific aortic atherosclerosis. Aorta is otherwise normal. There is a normal 3-vessel arch branching pattern. Heart size is normal, without pericardial effusion. Mediastinum/Nodes: Unchanged size of right hilar mass measuring 4.3 x 3.1 cm. Left hilar nodes are unchanged. Confluent pretracheal adenopathy also unchanged. Lungs/Pleura: Widespread ground-glass opacities are new since the prior study.  Right middle lobe nodule (image 48) has  decreased in size measuring 2.0 x 1.7 cm, previously 2.6 x 2.7 cm. There are small bilateral pleural effusions. 4 mm nodule in the left upper lobe is unchanged (image 42). Musculoskeletal: Sclerotic focus in the right aspect of the T5 vertebral body has markedly increased in size. Lytic lesion at T9 is unchanged. T7 lucent lesion is also unchanged. Review of the MIP images confirms the above findings. CT ABDOMEN and PELVIS FINDINGS Hepatobiliary: Unchanged hypodensity in the left hepatic lobe (2:12 measuring 12 mm. There is faint peripheral enhancement. There is also a small area of hyperenhancement more laterally at the same level (see aero). This is at the site of the hypodense focus seen on the previous CT. Subcapsular hypodensity with peripheral enhancement is unchanged (2:20). Status post cholecystectomy. Pancreas: Normal contours without ductal dilatation. No peripancreatic fluid collection. Spleen: Normal. Adrenals/Urinary Tract: --Adrenal glands: Normal. 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 --Left kidney/ureter: Proximal left ureter is dilated. There is focal hypoattenuation at the lower pole of the left kidney with suspected tumor thrombus in the lower branches of the left renal artery. --Urinary bladder: Dilated Stomach/Bowel: --Stomach/Duodenum: No hiatal hernia or other gastric abnormality. Normal duodenal course and caliber. --Small bowel: No dilatation or inflammation. --Colon: No focal abnormality. --Appendix: Normal. 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 There is calcific atherosclerosis of the aorta. The portal vein, splenic vein and superior mesenteric vein are patent. The IVC is patent. There is a 12 mm retrocaval lymph node, unchanged. Multiple subcentimeter retroperitoneal nodes. Reproductive: There is a small amount of free fluid in the pelvis.  Normal uterus. No adnexal mass. Musculoskeletal. Mixed density appearance of L2 is unchanged. Small sclerotic focus within the S1 body has slightly increased in size. Other: None. IMPRESSION: 1. Acute pulmonary emboli within multiple bilateral lobar and segmental branches. No CT evidence of acute right heart strain. 2. Widespread ground-glass opacities, new since the prior study. This could indicate infection or post treatment change. Lymphangitic tumor spread is also possible. New small bilateral pleural effusions. 3. Unchanged size of right hilar mass and mediastinal lymphadenopathy. Right middle lobe dominant nodule has decreased in size. 4. 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 Unchanged appearance of liver metastases allowing for differences in contrast timing. 6. Multiple osseous metastases, worsened clearly at T5. Aortic Atherosclerosis (ICD10-I70.0). Electronically Signed: By: Ulyses Jarred M.D. On: 03/17/2021 23:39   CT Angio Chest PE W and/or Wo Contrast  Result Date: 02/27/2021 CLINICAL DATA:  PE suspected, widely metastatic lung cancer EXAM: CT ANGIOGRAPHY CHEST WITH CONTRAST TECHNIQUE: Multidetector CT imaging of the chest was performed using the standard protocol during bolus administration of intravenous contrast. Multiplanar CT image reconstructions and MIPs were obtained to evaluate the vascular anatomy. CONTRAST:  13mL OMNIPAQUE IOHEXOL 350 MG/ML SOLN COMPARISON:  02/15/2021 FINDINGS: Cardiovascular: Satisfactory opacification of the pulmonary arteries to the segmental level. No evidence of pulmonary embolism. Cardiomegaly. Left and right coronary artery calcifications and stents. No pericardial effusion. Mediastinum/Nodes: Redemonstrated, very extensive bulky bilateral hilar, mediastinal, supraclavicular, and left axillary lymphadenopathy. Thyroid gland, trachea, and esophagus demonstrate no significant findings. Lungs/Pleura:  Diffuse bilateral bronchial wall thickening. Spiculated right upper lobe mass as seen on prior examination (series 7, image 42). Numerous redemonstrated small pulmonary nodules throughout the lungs. There is new, extensive ground-glass airspace opacity throughout the lungs, somewhat geographic  in the upper lobes (series 7, image 33), although somewhat more nodular appearing in the lower lungs (series 7, image 58). No pleural effusion or pneumothorax. Upper Abdomen: No acute abnormality. Multiple low-attenuation lesions of liver, better assessed by prior dedicated of the abdomen. Musculoskeletal: No chest wall abnormality. Multiple osseous metastatic lesions, most significantly a lytic lesion of the T9 vertebral body (series 9, image 58). Review of the MIP images confirms the above findings. IMPRESSION: 1. Negative examination for pulmonary embolism. 2. There is new, extensive ground-glass airspace opacity throughout the lungs, somewhat geographic in the upper lobes, although somewhat more nodular appearing in the lower lungs. Findings are nonspecific and infectious or inflammatory, differential considerations primarily include drug toxicity and atypical/viral infection. 3. Diffuse bilateral bronchial wall thickening, consistent with nonspecific infectious or inflammatory bronchitis. 4. Redemonstrated findings of advanced metastatic lung malignancy, better assessed by recent prior staging examination. 5. Coronary artery disease. Electronically Signed   By: Eddie Candle M.D.   On: 02/27/2021 10:49   NM Bone Scan Whole Body  Result Date: 02/27/2021 CLINICAL DATA:  Metastatic disease evaluation, lung mass, abnormal CT EXAM: NUCLEAR MEDICINE WHOLE BODY BONE SCAN TECHNIQUE: Whole body anterior and posterior images were obtained approximately 3 hours after intravenous injection of radiopharmaceutical. RADIOPHARMACEUTICALS:  19.79 mCi Technetium-14m MDP IV COMPARISON:  None Correlation: CT chest abdomen pelvis  02/15/2021, CT thoracic and lumbar spine 02/18/2021, CT head 02/15/2021 FINDINGS: Multiple sites of abnormal tracer uptake are identified consistent with widespread osseous metastatic disease. These include calvaria greatest at occipital bone growth more on LEFT, thoracic and lumbar spine, RIGHT ribs, and pelvis. Dextroconvex thoracolumbar scoliosis. No definite abnormal tracer uptake within the humeri or femora. Expected urinary tract and soft tissue distribution of tracer. IMPRESSION: Scattered osseous metastases as above. Electronically Signed   By: Lavonia Dana M.D.   On: 02/27/2021 18:17   CT Abdomen Pelvis W Contrast  Addendum Date: 03/17/2021   ADDENDUM REPORT: 03/17/2021 23:51 ADDENDUM: Critical Value/emergent results were called by telephone at the time of interpretation on 03/17/2021 at 11:46 pm to provider [facility] , who verbally acknowledged these results. Electronically Signed   By: Ulyses Jarred M.D.   On: 03/17/2021 23:51   Result Date: 03/17/2021 CLINICAL DATA:  Stage IV metastatic lung carcinoma. Discharged from ICU today. Lower abdominal pain after enema. EXAM: CT ANGIOGRAPHY CHEST CT ABDOMEN AND PELVIS WITH CONTRAST TECHNIQUE: Multidetector CT imaging of the chest was performed using the standard protocol during bolus administration of intravenous contrast. Multiplanar CT image reconstructions and MIPs were obtained to evaluate the vascular anatomy. Multidetector CT imaging of the abdomen and pelvis was performed using the standard protocol during bolus administration of intravenous contrast. CONTRAST:  83mL OMNIPAQUE IOHEXOL 350 MG/ML SOLN COMPARISON:  None. FINDINGS: CTA CHEST FINDINGS Cardiovascular: Contrast injection is sufficient to demonstrate satisfactory opacification of the pulmonary arteries to the segmental level.There are filling defects within the proximal right middle lobar artery and within the posterior segmental branches of the right lower lobe. On the left, there are  filling defects within the left lower lobe segmental branches. The main pulmonary artery is within normal limits for size. There is no CT evidence of acute right heart strain. Mild calcific aortic atherosclerosis. Aorta is otherwise normal. There is a normal 3-vessel arch branching pattern. Heart size is normal, without pericardial effusion. Mediastinum/Nodes: Unchanged size of right hilar mass measuring 4.3 x 3.1 cm. Left hilar nodes are unchanged. Confluent pretracheal adenopathy also unchanged. Lungs/Pleura: Widespread ground-glass opacities are new  since the prior study. Right middle lobe nodule (image 48) has decreased in size measuring 2.0 x 1.7 cm, previously 2.6 x 2.7 cm. There are small bilateral pleural effusions. 4 mm nodule in the left upper lobe is unchanged (image 42). Musculoskeletal: Sclerotic focus in the right aspect of the T5 vertebral body has markedly increased in size. Lytic lesion at T9 is unchanged. T7 lucent lesion is also unchanged. Review of the MIP images confirms the above findings. CT ABDOMEN and PELVIS FINDINGS Hepatobiliary: Unchanged hypodensity in the left hepatic lobe (2:12 measuring 12 mm. There is faint peripheral enhancement. There is also a small area of hyperenhancement more laterally at the same level (see aero). This is at the site of the hypodense focus seen on the previous CT. Subcapsular hypodensity with peripheral enhancement is unchanged (2:20). Status post cholecystectomy. Pancreas: Normal contours without ductal dilatation. No peripancreatic fluid collection. Spleen: Normal. Adrenals/Urinary Tract: --Adrenal glands: Normal. 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 --Left kidney/ureter: Proximal left ureter is dilated. There is focal hypoattenuation at the lower pole of the left kidney with suspected tumor thrombus in the lower branches of the left renal artery.  --Urinary bladder: Dilated Stomach/Bowel: --Stomach/Duodenum: No hiatal hernia or other gastric abnormality. Normal duodenal course and caliber. --Small bowel: No dilatation or inflammation. --Colon: No focal abnormality. --Appendix: Normal. 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 There is calcific atherosclerosis of the aorta. The portal vein, splenic vein and superior mesenteric vein are patent. The IVC is patent. There is a 12 mm retrocaval lymph node, unchanged. Multiple subcentimeter retroperitoneal nodes. Reproductive: There is a small amount of free fluid in the pelvis. Normal uterus. No adnexal mass. Musculoskeletal. Mixed density appearance of L2 is unchanged. Small sclerotic focus within the S1 body has slightly increased in size. Other: None. IMPRESSION: 1. Acute pulmonary emboli within multiple bilateral lobar and segmental branches. No CT evidence of acute right heart strain. 2. Widespread ground-glass opacities, new since the prior study. This could indicate infection or post treatment change. Lymphangitic tumor spread is also possible. New small bilateral pleural effusions. 3. Unchanged size of right hilar mass and mediastinal lymphadenopathy. Right middle lobe dominant nodule has decreased in size. 4. 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 Unchanged appearance of liver metastases allowing for differences in contrast timing. 6. Multiple osseous metastases, worsened clearly at T5. Aortic Atherosclerosis (ICD10-I70.0). Electronically Signed: By: Ulyses Jarred M.D. On: 03/17/2021 23:39   DG Chest Portable 1 View  Result Date: 03/17/2021 CLINICAL DATA:  Shortness of breath EXAM: PORTABLE CHEST 1 VIEW COMPARISON:  03/15/2021 FINDINGS: Worsening bilateral opacities throughout both lungs. No pneumothorax or sizable pleural effusion. Normal cardiomediastinal contours. IMPRESSION: Worsening bilateral airspace  opacities. Electronically Signed   By: Ulyses Jarred M.D.   On: 03/17/2021 21:23   DG Chest Portable 1 View  Result Date: 03/15/2021 CLINICAL DATA:  Shortness of breath EXAM: PORTABLE CHEST 1 VIEW COMPARISON:  03/07/2021 FINDINGS: Cardiac shadow is stable. Patchy airspace opacities are again identified slightly improved when compared with the prior exam. No new focal infiltrate is seen. No bony abnormality is noted. IMPRESSION: Slight improvement in previously seen airspace opacities bilaterally. Electronically Signed   By: Inez Catalina M.D.   On: 03/15/2021 11:38   DG Chest Portable 1 View  Result Date: 03/07/2021 CLINICAL DATA:  Shortness of breath EXAM: PORTABLE CHEST 1 VIEW  COMPARISON:  03/05/2021 FINDINGS: Patchy bilateral airspace disease throughout the lungs, slightly worsened since prior study. Nodular areas within the lungs, stable since prior study. Heart is normal size. No effusions or acute bony abnormality. IMPRESSION: Worsening patchy bilateral airspace disease, likely worsening pneumonia. Scattered nodules unchanged. Electronically Signed   By: Rolm Baptise M.D.   On: 03/07/2021 21:41   DG Chest Port 1 View  Result Date: 02/27/2021 CLINICAL DATA:  Worsening shortness of breath since this morning. Recent diagnosis of lung cancer. EXAM: PORTABLE CHEST 1 VIEW COMPARISON:  Radiograph earlier today.  CT earlier today. FINDINGS: Stable heart size and mediastinal contours. Right hilar prominence and thickening of the lower paratracheal stripe related to known adenopathy. Unchanged right mid lung pulmonary nodule. Mild patchy bilateral suprahilar opacities corresponding to ground-glass opacity on CT earlier today. No significant change in the interim. No pneumothorax or large pleural effusion. Retained excreted IV contrast in the right renal collecting system with right hydronephrosis, partially included in the upper abdomen. IMPRESSION: 1. Stable radiographic appearance of the chest from earlier  today. Similar patchy suprahilar opacities corresponding to ground-glass opacity on CT. 2. Right mid lung pulmonary nodule and thoracic adenopathy. 3. Partially included in the upper abdomen is retained excreted contrast in the right renal collecting system with right hydronephrosis. Right hydronephrosis was seen on renal ultrasound 02/18/2021. Electronically Signed   By: Keith Rake M.D.   On: 02/27/2021 15:04   DG Chest Portable 1 View  Result Date: 02/27/2021 CLINICAL DATA:  60 year old female with shortness of breath and cough. Metastatic lung cancer. EXAM: PORTABLE CHEST 1 VIEW COMPARISON:  02/15/2021 and prior studies FINDINGS: New hazy opacities within the central/upper lungs noted. RIGHT middle lobe mass and small scattered pulmonary nodules bilaterally are again identified. No pleural effusion or pneumothorax. No acute bony abnormalities are identified. IMPRESSION: New hazy opacities within the central/upper lungs which may represent edema or infection. Unchanged RIGHT middle lobe mass and bilateral pulmonary nodules compatible with known malignancy/metastases. Electronically Signed   By: Margarette Canada M.D.   On: 02/27/2021 08:49   [place] CORE BIOPSY (LYMPH NODES)  Result Date: 02/21/2021 INDICATION: Multiple enlarged lymph nodes, right middle lobe lung mass, liver lesions and bone lesions. EXAM: ULTRASOUND GUIDED CORE BIOPSY OF LEFT SUPRACLAVICULAR LYMPH NODE MEDICATIONS: None. ANESTHESIA/SEDATION: Versed 2.0 mg IV Moderate Sedation Time:  12 minutes. The patient was continuously monitored during the procedure by the interventional radiology nurse under my direct supervision. PROCEDURE: The procedure, risks, benefits, and alternatives were explained to the patient. Questions regarding the procedure were encouraged and answered. The patient understands and consents to the procedure. A time-out was performed prior to initiating the procedure. The left neck was prepped with chlorhexidine in a sterile  fashion, and a sterile drape was applied covering the operative field. A sterile gown and sterile gloves were used for the procedure. Local anesthesia was provided with 1% Lidocaine. After localizing an enlarged left supraclavicular lymph node by ultrasound, multiple 18 gauge core biopsy samples were obtained. Core biopsy samples were submitted in formalin as well as on saline soaked Telfa gauze. Additional ultrasound was performed. COMPLICATIONS: None immediate. FINDINGS: An enlarged left supraclavicular lymph node measures approximately 3.0 x 1.8 x 2.9 cm. Solid core biopsy samples were obtained. IMPRESSION: Ultrasound-guided core biopsy performed of an enlarged left supraclavicular lymph node measuring 3 cm in greatest dimensions. Electronically Signed   By: Aletta Edouard M.D.   On: 02/21/2021 15:31    Assessment and plan-  Stage IV  metastatic lung cancer with acute bilateral pulmonary embolism Chemotherapy-induced thrombocytopenia.  Goals of care discussion. Discussed with both patient and sisters about the overall poor prognosis and limited options for ongoing treatments.  Acute pulmonary embolism may also be a competing cause of death given that anticoagulation has been held.  Patient and patient's family feel that hospice is patient's best choice.  Patient is currently on the waiting list for hospice facility. DNR/DNI Continue comfort care.  Hospice home when the bed is available.  Thank you for allowing me to participate in the care of this patient.   Earlie Server, MD, PhD Hematology Oncology [facility] Pager- [phone] 03/21/2021

## 2021-03-21 NOTE — TOC Progression Note (Signed)
Transition of Care [facility]) - Progression Note    Patient Details  Name: Brenda Middleton MRN: [id number] Date of Birth: [birth year]  [facility] CM/SW Contact  Shelbie Hutching, RN Phone Number: 03/21/2021, 1:28 PM  Clinical Narrative:    Family is sitting with patient at the bedside today.  Patient and family would like to go to residential hospice and choose [facility].  Kieth Brightly given hospice home referral and patient has been accepted.  There is not a bed available today.     Expected Discharge Plan: [place] Barriers to Discharge: Hospice Bed not available  Expected Discharge Plan and Services Expected Discharge Plan: [place] In-house Referral: Hospice / Palliative Care Discharge Planning Services: CM Consult Post Acute Care Choice: Hospice Living arrangements for the past 2 months: Apartment                 DME Arranged: N/A DME Agency: NA       HH Arranged: NA           Social Determinants of Health (SDOH) Interventions    Readmission Risk Interventions Readmission Risk Prevention Plan 03/21/2021 02/28/2021  Transportation Screening Complete Complete  Medication Review Press photographer) Complete Complete  PCP or Specialist appointment within 3-5 days of discharge Complete Complete  HRI or Home Care Consult Complete Patient refused  SW Recovery Care/Counseling Consult Complete Complete  Palliative Care Screening Complete Not [place] Not Applicable Not Applicable  Some recent data might be hidden

## 2021-03-21 NOTE — Plan of Care (Signed)
Pt was agitated and unable to settle at the beginning of the shift. Oxygen was fixed and ativan given sublingual. Dr. Sidney Ace contacted due to patient removed IV access and needed sublingual morphine available. MD placed order. Pt received Seroquel and gabapentin. Pt rested well during the overnight. No longer agitated or removing lines. No Roxanol was needed.  Problem: Education: Goal: Knowledge of General Education information will improve Description: Including pain rating scale, medication(s)/side effects and non-pharmacologic comfort measures Outcome: Progressing   Problem: Health Behavior/Discharge Planning: Goal: Ability to manage health-related needs will improve Outcome: Progressing   Problem: Clinical Measurements: Goal: Ability to maintain clinical measurements within normal limits will improve Outcome: Progressing Goal: Will remain free from infection Outcome: Progressing Goal: Diagnostic test results will improve Outcome: Progressing Goal: Respiratory complications will improve Outcome: Progressing Goal: Cardiovascular complication will be avoided Outcome: Progressing   Problem: Activity: Goal: Risk for activity intolerance will decrease Outcome: Progressing   Problem: Nutrition: Goal: Adequate nutrition will be maintained Outcome: Progressing   Problem: Coping: Goal: Level of anxiety will decrease Outcome: Progressing   Problem: Elimination: Goal: Will not experience complications related to bowel motility Outcome: Progressing Goal: Will not experience complications related to urinary retention Outcome: Progressing   Problem: Pain Managment: Goal: General experience of comfort will improve Outcome: Progressing   Problem: Safety: Goal: Ability to remain free from injury will improve Outcome: Progressing   Problem: Skin Integrity: Goal: Risk for impaired skin integrity will decrease Outcome: Progressing

## 2021-03-21 NOTE — Progress Notes (Signed)
IVT consult placed 03/20/21 @ 1942.  IVT not on campus from 1900-0700.  Spoke with Raynelle Chary, RN, pt no longer need IV access r/t meds will be given orally. Consult will be discontinued.

## 2021-03-22 ENCOUNTER — Ambulatory Visit: Payer: Medicaid Other

## 2021-03-22 ENCOUNTER — Inpatient Hospital Stay: Payer: Medicaid Other | Admitting: Oncology

## 2021-03-22 ENCOUNTER — Inpatient Hospital Stay: Payer: Medicaid Other

## 2021-03-22 MED ORDER — LORAZEPAM 2 MG/ML PO CONC: 1.0000 mg | ORAL | 0 refills | Status: AC | PRN

## 2021-03-22 MED ORDER — HALOPERIDOL LACTATE 2 MG/ML PO CONC
0.5000 mg | ORAL | 0 refills | Status: AC | PRN
Start: 2021-03-22 — End: ?

## 2021-03-22 MED ORDER — GLYCOPYRROLATE 1 MG PO TABS: 1.0000 mg | ORAL_TABLET | ORAL | Status: AC | PRN

## 2021-03-22 MED ORDER — MORPHINE SULFATE (CONCENTRATE) 10 MG/0.5ML PO SOLN: 10.0000 mg | ORAL | 0 refills | Status: AC | PRN

## 2021-03-22 MED ORDER — GLYCOPYRROLATE 0.2 MG/ML IJ SOLN: 0.2000 mg | INTRAMUSCULAR | Status: AC | PRN

## 2021-03-22 MED ORDER — HALOPERIDOL 0.5 MG PO TABS: 0.5000 mg | ORAL_TABLET | ORAL | Status: AC | PRN

## 2021-03-22 MED ORDER — OXYCODONE HCL 5 MG PO TABS: 5.0000 mg | ORAL_TABLET | ORAL | 0 refills | Status: AC | PRN

## 2021-03-22 MED ORDER — CLONAZEPAM 0.5 MG PO TABS: 0.5000 mg | ORAL_TABLET | Freq: Three times a day (TID) | ORAL | 0 refills | Status: AC | PRN

## 2021-03-22 NOTE — Progress Notes (Signed)
Patient has no IV access. Patient being discharged to [facility]. Called report to Santiago Glad at the [facility]. Patient being transported via EMS.

## 2021-03-22 NOTE — Discharge Summary (Signed)
[facility]  Physician Discharge Summary   Patient ID: Brenda Middleton MRN: [id number] DOB/AGE: Apr 22, 1961 60 y.o.  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   CODE STATUS: DNR  DISCHARGE CONDITION: poor  Diet recommendation: Comfort feeds as tolerated.  INITIAL HISTORY: 60 y.o.femalewith medical history significant for metastatic lung cancer, and anxiety and depression and hypertension, who was just admitted here on 4/12 for toxic encephalopathy with polypharmacy bibasal infiltrates and possible pneumonia, hypokalemia and hypomagnesemia and was discharged on 4/14 as she wanted to go home. Unfortunately when she went home she started having abdominal pain with associated nausea andvomiting. She was unable to keep any food or fluids down.   Upon assessment in the ED she was found to be tachycardic.  She was also noted to have an increased respiratory rate.  CT angiogram was done which was positive for pulmonary embolism.  She was placed on heparin.  She was hospitalized for further management.    Consultants:  Palliative care.   Medical oncology   HOSPITAL COURSE:   Goals of care/end-of-life care Patient with stage IV lung cancer with diffuse metastases involving the skeletal system as well as brain.  Seen by medical oncology.  Underwent first cycle which she did not tolerate well.  Prognosis per oncology is extremely poor.  Patient was hospitalized last week and hospice was recommended which the patient declined.  She went  home.  Came back within a few hours due to uncontrolled pain.  Subsequently found to have acute pulmonary embolism which is likely due to her hypercoagulable state from her malignancy.  Discussions held with the patient's sister.  Patient was unable to communicate as she remained obtunded. Considering poor prognosis and after discussion with other family members, decision was made to transition her to comfort care.  Over the last 2 to 3 days pain is better controlled.  She seems to be more comfortable.  Has been able to communicate as well though minimally.  Disposition was discussed with patient's family.  They do not have adequate support system to take care of her at home.  Life expectancy very likely is around 2 weeks considering poor oral intake and severity of disease.  Residential hospice was pursued.    Acute bilateral pulmonary embolism in the setting of metastatic lung cancer Patient likely hypercoagulable due to her malignancy.  Patient was initially started on IV heparin.  Platelet counts were noted to be decreasing.  Subsequently discussions were held as mentioned above.  Now comfort care.  Heparin was discontinued.    Concern for multifocal pneumonia/acute respiratory failure with hypoxia Findings on the CT scan could also be due to her malignancy.  She was empirically started on IV antibiotics which was subsequently discontinued.  Leukocytosis could also be due to steroids.  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  Once again due to her multiple other comorbidities and poor overall prognosis she is not a candidate for any aggressive interventions.   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  Seen by medical oncology  and her prognosis is thought to be extremely poor.  Steroids were also discontinued.  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  Her pain was very difficult to control.  We are continuing her fentanyl patch and she is on multiple as needed medications.  Pain seems to be reasonably well controlled at this time.  Elevated lipase level Etiology is not clear.  Pancreas did not show any concerning findings on CT scan.    History of depression and anxiety Continue Seroquel.  Noted to be on Ativan as well.    Peripheral neuropathy On gabapentin.  Essential hypertension Elevated blood pressure most likely due to poorly controlled pain.      Polypharmacy/recent toxic encephalopathy Likely due to medications as well as her malignancy.  Now comfort care.  Normocytic anemia/thrombocytopenia This is all secondary to malignancy as well as chemotherapy.  Consumption coagulopathy is also a possibility considering that she has a PE.    Pressure injury/stage II decubitus Pressure Injury 03/15/21 Coccyx Mid Stage 2 -  Partial thickness loss of dermis presenting as a shallow open injury with a red, pink wound bed without slough. (Active)  03/15/21 1601  Location: Coccyx  Location Orientation: Mid  Staging: Stage 2 -  Partial thickness loss of dermis presenting as a shallow open injury with a red, pink wound bed without slough.  Wound Description (Comments):   Present on Admission: Yes    Okay for discharge to residential hospice when bed is available.   PERTINENT LABS:  The results of significant diagnostics from this hospitalization (including imaging, microbiology, ancillary and laboratory) are listed below for reference.    Microbiology: Recent Results (from the past 240 hour(s))  Culture, blood (routine x 2)     Status: Abnormal   Collection Time: 03/15/21 11:09 AM   Specimen: BLOOD  Result Value Ref Range Status   Specimen Description   Final    BLOOD RIGHT HAND Performed at [facility], [address] [place], [place] [address]    Special Requests   Final    BOTTLES DRAWN  AEROBIC AND ANAEROBIC Blood Culture results may not be optimal due to an inadequate volume of blood received in culture bottles Performed at [facility], [address] [place], [place] [address]    Culture  Setup Time   Final    Organism ID to follow [place] TO, READ BACK BY AND VERIFIED WITH: ABBY ELLINGTON AT 1103 03/16/21 [place] Performed at [facility], [address] [place], [place] [address]    Culture (A)  Final    STAPHYLOCOCCUS EPIDERMIDIS THE SIGNIFICANCE OF ISOLATING THIS ORGANISM FROM A SINGLE SET OF BLOOD CULTURES WHEN MULTIPLE SETS ARE DRAWN IS UNCERTAIN. PLEASE NOTIFY THE MICROBIOLOGY DEPARTMENT WITHIN ONE WEEK IF SPECIATION AND SENSITIVITIES ARE REQUIRED. Performed at [facility], [place] [address], [place], [place] [address]    Report Status 03/17/2021 FINAL  Final  Resp Panel by RT-PCR (Flu A&B, Covid) Nasopharyngeal Swab     Status: None   Collection Time: 03/15/21 11:09 AM   Specimen: Nasopharyngeal Swab; Nasopharyngeal(NP) swabs in vial transport medium  Result Value Ref Range Status   SARS Coronavirus 2 by RT PCR NEGATIVE NEGATIVE Final    Comment: (NOTE) SARS-CoV-2 target nucleic acids are NOT DETECTED.  The SARS-CoV-2 RNA is generally detectable in upper respiratory specimens during the acute phase of infection. The lowest concentration of SARS-CoV-2 viral copies  this assay can detect is 138 copies/mL. A negative result does not preclude SARS-Cov-2 infection and should not be used as the sole basis for treatment or other patient management decisions. A negative result may occur with  improper specimen collection/handling, submission of specimen other than nasopharyngeal swab, presence of viral mutation(s) within the areas targeted by this assay, and inadequate number of viral copies(<138 copies/mL). A negative result must be combined with clinical observations, patient history,  and epidemiological information. The expected result is Negative.  Fact Sheet for Patients:  EntrepreneurPulse.com.au  Fact Sheet for Healthcare Providers:  IncredibleEmployment.be  This test is no t yet approved or cleared by the [place] FDA and  has been authorized for detection and/or diagnosis of SARS-CoV-2 by FDA under an Emergency Use Authorization (EUA). This EUA will remain  in effect (meaning this test can be used) for the duration of the COVID-19 declaration under Section 564(b)(1) of the Act, 21 U.S.C.section 360bbb-3(b)(1), unless the authorization is terminated  or revoked sooner.       Influenza A by PCR NEGATIVE NEGATIVE Final   Influenza B by PCR NEGATIVE NEGATIVE Final    Comment: (NOTE) The Xpert Xpress SARS-CoV-2/FLU/RSV plus assay is intended as an aid in the diagnosis of influenza from Nasopharyngeal swab specimens and should not be used as a sole basis for treatment. Nasal washings and aspirates are unacceptable for Xpert Xpress SARS-CoV-2/FLU/RSV testing.  Fact Sheet for Patients: EntrepreneurPulse.com.au  Fact Sheet for Healthcare Providers: IncredibleEmployment.be  This test is not yet approved or cleared by the [place] FDA and has been authorized for detection and/or diagnosis of SARS-CoV-2 by FDA under an Emergency Use Authorization (EUA). This EUA will remain in effect (meaning this test can be used) for the duration of the COVID-19 declaration under Section 564(b)(1) of the Act, 21 U.S.C. section 360bbb-3(b)(1), unless the authorization is terminated or revoked.  Performed at [facility], [address] [place], [place] [address]   Blood Culture ID Panel (Reflexed)     Status: Abnormal   Collection Time: 03/15/21 11:09 AM  Result Value Ref Range Status   Enterococcus faecalis NOT DETECTED NOT DETECTED Final   Enterococcus Faecium NOT DETECTED NOT  DETECTED Final   Listeria monocytogenes NOT DETECTED NOT DETECTED Final   Staphylococcus species DETECTED (A) NOT DETECTED Final    Comment: CRITICAL RESULT CALLED TO, READ BACK BY AND VERIFIED WITH:  ABBY ELLINGTON AT 1103 03/16/21 SDR    Staphylococcus aureus (BCID) NOT DETECTED NOT DETECTED Final   Staphylococcus epidermidis DETECTED (A) NOT DETECTED Final    Comment: Methicillin (oxacillin) resistant coagulase negative staphylococcus. Possible blood culture contaminant (unless isolated from more than one blood culture draw or clinical case suggests pathogenicity). No antibiotic treatment is indicated for blood  culture contaminants. CRITICAL RESULT CALLED TO, READ BACK BY AND VERIFIED WITH:  ABBY ELLINGTON AT 2423 03/16/21 SDR    Staphylococcus lugdunensis NOT DETECTED NOT DETECTED Final   Streptococcus species NOT DETECTED NOT DETECTED Final   Streptococcus agalactiae NOT DETECTED NOT DETECTED Final   Streptococcus pneumoniae NOT DETECTED NOT DETECTED Final   Streptococcus pyogenes NOT DETECTED NOT DETECTED Final   A.calcoaceticus-baumannii NOT DETECTED NOT DETECTED Final   Bacteroides fragilis NOT DETECTED NOT DETECTED Final   Enterobacterales NOT DETECTED NOT DETECTED Final   Enterobacter cloacae complex NOT DETECTED NOT DETECTED Final   Escherichia coli NOT DETECTED NOT DETECTED Final   Klebsiella aerogenes NOT DETECTED NOT DETECTED Final   Klebsiella oxytoca NOT DETECTED  NOT DETECTED Final   Klebsiella pneumoniae NOT DETECTED NOT DETECTED Final   Proteus species NOT DETECTED NOT DETECTED Final   Salmonella species NOT DETECTED NOT DETECTED Final   Serratia marcescens NOT DETECTED NOT DETECTED Final   Haemophilus influenzae NOT DETECTED NOT DETECTED Final   Neisseria meningitidis NOT DETECTED NOT DETECTED Final   Pseudomonas aeruginosa NOT DETECTED NOT DETECTED Final   Stenotrophomonas maltophilia NOT DETECTED NOT DETECTED Final   Candida albicans NOT DETECTED NOT DETECTED  Final   Candida auris NOT DETECTED NOT DETECTED Final   Candida glabrata NOT DETECTED NOT DETECTED Final   Candida krusei NOT DETECTED NOT DETECTED Final   Candida parapsilosis NOT DETECTED NOT DETECTED Final   Candida tropicalis NOT DETECTED NOT DETECTED Final   Cryptococcus neoformans/gattii NOT DETECTED NOT DETECTED Final   Methicillin resistance mecA/C DETECTED (A) NOT DETECTED Final    Comment: CRITICAL RESULT CALLED TO, READ BACK BY AND VERIFIED WITH:  ABBY ELLINGTON AT 1103 03/16/21 SDR Performed at [facility], [address] [place], [place] [address]   Culture, blood (routine x 2)     Status: None   Collection Time: 03/15/21 12:10 PM   Specimen: BLOOD  Result Value Ref Range Status   Specimen Description BLOOD RIGHT [facility]  Final   Special Requests   Final    BOTTLES DRAWN AEROBIC AND ANAEROBIC Blood Culture adequate volume   Culture   Final    NO GROWTH 5 DAYS Performed at [facility], [address] [place], [place] [address]    Report Status 03/20/2021 FINAL  Final  MRSA PCR Screening     Status: None   Collection Time: 03/15/21  2:30 PM   Specimen: Nasal Mucosa; Nasopharyngeal  Result Value Ref Range Status   MRSA by PCR NEGATIVE NEGATIVE Final    Comment:        The GeneXpert MRSA Assay (FDA approved for NASAL specimens only), is one component of a comprehensive MRSA colonization surveillance program. It is not intended to diagnose MRSA infection nor to guide or monitor treatment for MRSA infections. Performed at [facility], [place], [place] [address]   SARS CORONAVIRUS 2 (TAT 6-24 HRS) Nasopharyngeal Nasopharyngeal Swab     Status: None   Collection Time: 03/17/21 10:34 PM   Specimen: Nasopharyngeal Swab  Result Value Ref Range Status   SARS Coronavirus 2 NEGATIVE NEGATIVE Final    Comment: (NOTE) SARS-CoV-2 target nucleic acids are NOT DETECTED.  The SARS-CoV-2 RNA is generally detectable in upper and  lower respiratory specimens during the acute phase of infection. Negative results do not preclude SARS-CoV-2 infection, do not rule out co-infections with other pathogens, and should not be used as the sole basis for treatment or other patient management decisions. Negative results must be combined with clinical observations, patient history, and epidemiological information. The expected result is Negative.  Fact Sheet for Patients: SugarRoll.be  Fact Sheet for Healthcare Providers: https://www.woods-mathews.com/  This test is not yet approved or cleared by the [place] FDA and  has been authorized for detection and/or diagnosis of SARS-CoV-2 by FDA under an Emergency Use Authorization (EUA). This EUA will remain  in effect (meaning this test can be used) for the duration of the COVID-19 declaration under Se ction 564(b)(1) of the Act, 21 U.S.C. section 360bbb-3(b)(1), unless the authorization is terminated or revoked sooner.  Performed at [facility], [place] [address] [place], [place] [address]  Labs:  COVID-19 Labs   Lab Results  Component Value Date   SARSCOV2NAA NEGATIVE 03/17/2021   [place] NEGATIVE 03/15/2021   [place] NEGATIVE 03/07/2021   [place] NEGATIVE 03/05/2021      Basic Metabolic Panel: Recent Labs  Lab 03/15/21 1109 03/16/21 0314 03/17/21 0321 03/17/21 2051 03/18/21 0447  NA 140 140 139 135 136  K 3.6 4.0 4.2 4.0 3.5  CL 106 109 108 103 105  CO2 23 24 23 22 22   GLUCOSE 137* 143* 157* 82 80  BUN 30* 26* 24* 21* 17  CREATININE 0.86 0.72 0.61 0.70 0.54  CALCIUM 8.2* 7.7* 7.8* 7.9* 7.5*   Liver Function Tests: Recent Labs  Lab 03/15/21 1109 03/17/21 2051  AST 27 89*  ALT 27 52*  ALKPHOS 278* 295*  BILITOT 0.4 1.0  PROT 6.1* 6.0*  ALBUMIN 2.6* 2.3*   Recent Labs  Lab 03/15/21 1109 03/17/21 2051  LIPASE 50 533*   Recent Labs  Lab 03/15/21 1109  AMMONIA 23    CBC: Recent Labs  Lab 03/15/21 1109 03/16/21 0314 03/17/21 0321 03/17/21 2051 03/18/21 0447  WBC 18.1* 16.5* 15.8* 16.2* 12.7*  NEUTROABS 16.9*  --   --  14.6*  --   HGB 8.5* 7.6* 8.2* 8.8* 8.1*  HCT 25.9* 23.6* 24.8* 26.6* 25.2*  MCV 84.6 84.3 83.5 83.6 84.3  PLT 97* 99* 75* 63* 49*   BNP: BNP (last 3 results) Recent Labs    03/05/21 2143 03/07/21 2123 03/15/21 1109  BNP 92.2 110.1* 202.3*     IMAGING STUDIES DG Chest 2 View  Result Date: 03/05/2021 CLINICAL DATA:  Shortness of breath EXAM: CHEST - 2 VIEW COMPARISON:  02/27/2021 FINDINGS: Heart is normal size. Right upper lobe mass again noted as seen on CT. Patchy bilateral airspace disease again noted, similar to prior study. No effusions or acute bony abnormality. IMPRESSION: Patchy bilateral airspace disease again noted, not significantly changed. Right upper lobe mass again seen. Electronically Signed   By: Rolm Baptise M.D.   On: 03/05/2021 21:42   CT CHEST W CONTRAST  Result Date: 03/06/2021 CLINICAL DATA:  Respiratory failure, shortness of breath EXAM: CT CHEST WITH CONTRAST TECHNIQUE: Multidetector CT imaging of the chest was performed during intravenous contrast administration. CONTRAST:  2mL OMNIPAQUE IOHEXOL 300 MG/ML  SOLN COMPARISON:  02/27/2021.  Chest x-ray 03/05/2021 FINDINGS: Cardiovascular: Heart is normal size. Coronary artery and aortic calcifications. No aneurysm. Mediastinum/Nodes: Bulky mediastinal and bilateral hilar adenopathy again noted, unchanged. Trachea is patent. Thyroid unremarkable. Lungs/Pleura: Ground-glass airspace opacities in the lower lobes have worsened since prior study, particularly in the left lower lobe. Ground-glass opacities in the upper lobes somewhat improved since prior study. Numerous bilateral pulmonary nodules are stable. Right upper lobe mass again noted and stable. No effusions. Upper Abdomen: Imaging into the upper abdomen demonstrates no acute findings. Musculoskeletal:  Chest wall soft tissues are unremarkable. Multiple osseous metastases again noted, unchanged. IMPRESSION: Redemonstrated is extensive metastatic disease in the chest with numerous pulmonary nodules, dominant right upper lobe mass, and bulky mediastinal/bilateral hilar adenopathy. Ground-glass opacities have increased in the lower lobes, left greater than right, likely infectious/inflammatory. Somewhat improvement in ground-glass opacities in the upper lobe since prior study. Stable osseous metastatic disease. Coronary artery disease. Aortic Atherosclerosis (ICD10-I70.0). Electronically Signed   By: Rolm Baptise M.D.   On: 03/06/2021 00:11   CT Angio Chest PE W/Cm &/Or Wo Cm  Addendum Date: 03/17/2021   ADDENDUM REPORT: 03/17/2021 23:51 ADDENDUM: Critical Value/emergent results were called  by telephone at the time of interpretation on 03/17/2021 at 11:46 pm to provider [facility] , who verbally acknowledged these results. Electronically Signed   By: Ulyses Jarred M.D.   On: 03/17/2021 23:51   Result Date: 03/17/2021 CLINICAL DATA:  Stage IV metastatic lung carcinoma. Discharged from ICU today. Lower abdominal pain after enema. EXAM: CT ANGIOGRAPHY CHEST CT ABDOMEN AND PELVIS WITH CONTRAST TECHNIQUE: Multidetector CT imaging of the chest was performed using the standard protocol during bolus administration of intravenous contrast. Multiplanar CT image reconstructions and MIPs were obtained to evaluate the vascular anatomy. Multidetector CT imaging of the abdomen and pelvis was performed using the standard protocol during bolus administration of intravenous contrast. CONTRAST:  53mL OMNIPAQUE IOHEXOL 350 MG/ML SOLN COMPARISON:  None. FINDINGS: CTA CHEST FINDINGS Cardiovascular: Contrast injection is sufficient to demonstrate satisfactory opacification of the pulmonary arteries to the segmental level.There are filling defects within the proximal right middle lobar artery and within the posterior segmental  branches of the right lower lobe. On the left, there are filling defects within the left lower lobe segmental branches. The main pulmonary artery is within normal limits for size. There is no CT evidence of acute right heart strain. Mild calcific aortic atherosclerosis. Aorta is otherwise normal. There is a normal 3-vessel arch branching pattern. Heart size is normal, without pericardial effusion. Mediastinum/Nodes: Unchanged size of right hilar mass measuring 4.3 x 3.1 cm. Left hilar nodes are unchanged. Confluent pretracheal adenopathy also unchanged. Lungs/Pleura: Widespread ground-glass opacities are new since the prior study. Right middle lobe nodule (image 48) has decreased in size measuring 2.0 x 1.7 cm, previously 2.6 x 2.7 cm. There are small bilateral pleural effusions. 4 mm nodule in the left upper lobe is unchanged (image 42). Musculoskeletal: Sclerotic focus in the right aspect of the T5 vertebral body has markedly increased in size. Lytic lesion at T9 is unchanged. T7 lucent lesion is also unchanged. Review of the MIP images confirms the above findings. CT ABDOMEN and PELVIS FINDINGS Hepatobiliary: Unchanged hypodensity in the left hepatic lobe (2:12 measuring 12 mm. There is faint peripheral enhancement. There is also a small area of hyperenhancement more laterally at the same level (see aero). This is at the site of the hypodense focus seen on the previous CT. Subcapsular hypodensity with peripheral enhancement is unchanged (2:20). Status post cholecystectomy. Pancreas: Normal contours without ductal dilatation. No peripancreatic fluid collection. Spleen: Normal. Adrenals/Urinary Tract: --Adrenal glands: Normal. 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 --Left kidney/ureter: Proximal left ureter is dilated. There is focal hypoattenuation at the lower pole of the left kidney with suspected tumor  thrombus in the lower branches of the left renal artery. --Urinary bladder: Dilated Stomach/Bowel: --Stomach/Duodenum: No hiatal hernia or other gastric abnormality. Normal duodenal course and caliber. --Small bowel: No dilatation or inflammation. --Colon: No focal abnormality. --Appendix: Normal. 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 There is calcific atherosclerosis of the aorta. The portal vein, splenic vein and superior mesenteric vein are patent. The IVC is patent. There is a 12 mm retrocaval lymph node, unchanged. Multiple subcentimeter retroperitoneal nodes. Reproductive: There is a small amount of free fluid in the pelvis. Normal uterus. No adnexal mass. Musculoskeletal. Mixed density appearance of L2 is unchanged. Small sclerotic focus within the S1 body has slightly increased in size. Other: None. IMPRESSION: 1. Acute pulmonary emboli within multiple bilateral lobar and segmental branches. No CT evidence of  acute right heart strain. 2. Widespread ground-glass opacities, new since the prior study. This could indicate infection or post treatment change. Lymphangitic tumor spread is also possible. New small bilateral pleural effusions. 3. Unchanged size of right hilar mass and mediastinal lymphadenopathy. Right middle lobe dominant nodule has decreased in size. 4. 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 Unchanged appearance of liver metastases allowing for differences in contrast timing. 6. Multiple osseous metastases, worsened clearly at T5. Aortic Atherosclerosis (ICD10-I70.0). Electronically Signed: By: Ulyses Jarred M.D. On: 03/17/2021 23:39   CT Angio Chest PE W and/or Wo Contrast  Result Date: 02/27/2021 CLINICAL DATA:  PE suspected, widely metastatic lung cancer EXAM: CT ANGIOGRAPHY CHEST WITH CONTRAST TECHNIQUE: Multidetector CT imaging of the chest was performed using the standard protocol during  bolus administration of intravenous contrast. Multiplanar CT image reconstructions and MIPs were obtained to evaluate the vascular anatomy. CONTRAST:  42mL OMNIPAQUE IOHEXOL 350 MG/ML SOLN COMPARISON:  02/15/2021 FINDINGS: Cardiovascular: Satisfactory opacification of the pulmonary arteries to the segmental level. No evidence of pulmonary embolism. Cardiomegaly. Left and right coronary artery calcifications and stents. No pericardial effusion. Mediastinum/Nodes: Redemonstrated, very extensive bulky bilateral hilar, mediastinal, supraclavicular, and left axillary lymphadenopathy. Thyroid gland, trachea, and esophagus demonstrate no significant findings. Lungs/Pleura: Diffuse bilateral bronchial wall thickening. Spiculated right upper lobe mass as seen on prior examination (series 7, image 42). Numerous redemonstrated small pulmonary nodules throughout the lungs. There is new, extensive ground-glass airspace opacity throughout the lungs, somewhat geographic in the upper lobes (series 7, image 33), although somewhat more nodular appearing in the lower lungs (series 7, image 58). No pleural effusion or pneumothorax. Upper Abdomen: No acute abnormality. Multiple low-attenuation lesions of liver, better assessed by prior dedicated of the abdomen. Musculoskeletal: No chest wall abnormality. Multiple osseous metastatic lesions, most significantly a lytic lesion of the T9 vertebral body (series 9, image 58). Review of the MIP images confirms the above findings. IMPRESSION: 1. Negative examination for pulmonary embolism. 2. There is new, extensive ground-glass airspace opacity throughout the lungs, somewhat geographic in the upper lobes, although somewhat more nodular appearing in the lower lungs. Findings are nonspecific and infectious or inflammatory, differential considerations primarily include drug toxicity and atypical/viral infection. 3. Diffuse bilateral bronchial wall thickening, consistent with nonspecific  infectious or inflammatory bronchitis. 4. Redemonstrated findings of advanced metastatic lung malignancy, better assessed by recent prior staging examination. 5. Coronary artery disease. Electronically Signed   By: Eddie Candle M.D.   On: 02/27/2021 10:49   NM Bone Scan Whole Body  Result Date: 02/27/2021 CLINICAL DATA:  Metastatic disease evaluation, lung mass, abnormal CT EXAM: NUCLEAR MEDICINE WHOLE BODY BONE SCAN TECHNIQUE: Whole body anterior and posterior images were obtained approximately 3 hours after intravenous injection of radiopharmaceutical. RADIOPHARMACEUTICALS:  19.79 mCi Technetium-39m MDP IV COMPARISON:  None Correlation: CT chest abdomen pelvis 02/15/2021, CT thoracic and lumbar spine 02/18/2021, CT head 02/15/2021 FINDINGS: Multiple sites of abnormal tracer uptake are identified consistent with widespread osseous metastatic disease. These include calvaria greatest at occipital bone growth more on LEFT, thoracic and lumbar spine, RIGHT ribs, and pelvis. Dextroconvex thoracolumbar scoliosis. No definite abnormal tracer uptake within the humeri or femora. Expected urinary tract and soft tissue distribution of tracer. IMPRESSION: Scattered osseous metastases as above. Electronically Signed   By: Lavonia Dana M.D.   On: 02/27/2021 18:17   CT Abdomen Pelvis W Contrast  Addendum Date: 03/17/2021   ADDENDUM REPORT: 03/17/2021 23:51 ADDENDUM: Critical Value/emergent  results were called by telephone at the time of interpretation on 03/17/2021 at 11:46 pm to provider [facility] , who verbally acknowledged these results. Electronically Signed   By: Ulyses Jarred M.D.   On: 03/17/2021 23:51   Result Date: 03/17/2021 CLINICAL DATA:  Stage IV metastatic lung carcinoma. Discharged from ICU today. Lower abdominal pain after enema. EXAM: CT ANGIOGRAPHY CHEST CT ABDOMEN AND PELVIS WITH CONTRAST TECHNIQUE: Multidetector CT imaging of the chest was performed using the standard protocol during bolus  administration of intravenous contrast. Multiplanar CT image reconstructions and MIPs were obtained to evaluate the vascular anatomy. Multidetector CT imaging of the abdomen and pelvis was performed using the standard protocol during bolus administration of intravenous contrast. CONTRAST:  30mL OMNIPAQUE IOHEXOL 350 MG/ML SOLN COMPARISON:  None. FINDINGS: CTA CHEST FINDINGS Cardiovascular: Contrast injection is sufficient to demonstrate satisfactory opacification of the pulmonary arteries to the segmental level.There are filling defects within the proximal right middle lobar artery and within the posterior segmental branches of the right lower lobe. On the left, there are filling defects within the left lower lobe segmental branches. The main pulmonary artery is within normal limits for size. There is no CT evidence of acute right heart strain. Mild calcific aortic atherosclerosis. Aorta is otherwise normal. There is a normal 3-vessel arch branching pattern. Heart size is normal, without pericardial effusion. Mediastinum/Nodes: Unchanged size of right hilar mass measuring 4.3 x 3.1 cm. Left hilar nodes are unchanged. Confluent pretracheal adenopathy also unchanged. Lungs/Pleura: Widespread ground-glass opacities are new since the prior study. Right middle lobe nodule (image 48) has decreased in size measuring 2.0 x 1.7 cm, previously 2.6 x 2.7 cm. There are small bilateral pleural effusions. 4 mm nodule in the left upper lobe is unchanged (image 42). Musculoskeletal: Sclerotic focus in the right aspect of the T5 vertebral body has markedly increased in size. Lytic lesion at T9 is unchanged. T7 lucent lesion is also unchanged. Review of the MIP images confirms the above findings. CT ABDOMEN and PELVIS FINDINGS Hepatobiliary: Unchanged hypodensity in the left hepatic lobe (2:12 measuring 12 mm. There is faint peripheral enhancement. There is also a small area of hyperenhancement more laterally at the same level (see  aero). This is at the site of the hypodense focus seen on the previous CT. Subcapsular hypodensity with peripheral enhancement is unchanged (2:20). Status post cholecystectomy. Pancreas: Normal contours without ductal dilatation. No peripancreatic fluid collection. Spleen: Normal. Adrenals/Urinary Tract: --Adrenal glands: Normal. 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 --Left kidney/ureter: Proximal left ureter is dilated. There is focal hypoattenuation at the lower pole of the left kidney with suspected tumor thrombus in the lower branches of the left renal artery. --Urinary bladder: Dilated Stomach/Bowel: --Stomach/Duodenum: No hiatal hernia or other gastric abnormality. Normal duodenal course and caliber. --Small bowel: No dilatation or inflammation. --Colon: No focal abnormality. --Appendix: Normal. 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 There is calcific atherosclerosis of the aorta. The portal vein, splenic vein and superior mesenteric vein are patent. The IVC is patent. There is a 12 mm retrocaval lymph node, unchanged. Multiple subcentimeter retroperitoneal nodes. Reproductive: There is a small amount of free fluid in the pelvis. Normal uterus. No adnexal mass. Musculoskeletal. Mixed density appearance of L2 is unchanged. Small sclerotic focus within the S1 body has slightly increased in size. Other: None. IMPRESSION: 1. Acute pulmonary emboli within multiple bilateral lobar and segmental branches. No  CT evidence of acute right heart strain. 2. Widespread ground-glass opacities, new since the prior study. This could indicate infection or post treatment change. Lymphangitic tumor spread is also possible. New small bilateral pleural effusions. 3. Unchanged size of right hilar mass and mediastinal lymphadenopathy. Right middle lobe dominant nodule has decreased in size.  4. 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 Unchanged appearance of liver metastases allowing for differences in contrast timing. 6. Multiple osseous metastases, worsened clearly at T5. Aortic Atherosclerosis (ICD10-I70.0). Electronically Signed: By: Ulyses Jarred M.D. On: 03/17/2021 23:39   DG Chest Portable 1 View  Result Date: 03/17/2021 CLINICAL DATA:  Shortness of breath EXAM: PORTABLE CHEST 1 VIEW COMPARISON:  03/15/2021 FINDINGS: Worsening bilateral opacities throughout both lungs. No pneumothorax or sizable pleural effusion. Normal cardiomediastinal contours. IMPRESSION: Worsening bilateral airspace opacities. Electronically Signed   By: Ulyses Jarred M.D.   On: 03/17/2021 21:23   DG Chest Portable 1 View  Result Date: 03/15/2021 CLINICAL DATA:  Shortness of breath EXAM: PORTABLE CHEST 1 VIEW COMPARISON:  03/07/2021 FINDINGS: Cardiac shadow is stable. Patchy airspace opacities are again identified slightly improved when compared with the prior exam. No new focal infiltrate is seen. No bony abnormality is noted. IMPRESSION: Slight improvement in previously seen airspace opacities bilaterally. Electronically Signed   By: Inez Catalina M.D.   On: 03/15/2021 11:38   DG Chest Portable 1 View  Result Date: 03/07/2021 CLINICAL DATA:  Shortness of breath EXAM: PORTABLE CHEST 1 VIEW COMPARISON:  03/05/2021 FINDINGS: Patchy bilateral airspace disease throughout the lungs, slightly worsened since prior study. Nodular areas within the lungs, stable since prior study. Heart is normal size. No effusions or acute bony abnormality. IMPRESSION: Worsening patchy bilateral airspace disease, likely worsening pneumonia. Scattered nodules unchanged. Electronically Signed   By: Rolm Baptise M.D.   On: 03/07/2021 21:41   DG Chest Port 1 View  Result Date: 02/27/2021 CLINICAL DATA:  Worsening shortness of breath since this morning. Recent diagnosis of lung  cancer. EXAM: PORTABLE CHEST 1 VIEW COMPARISON:  Radiograph earlier today.  CT earlier today. FINDINGS: Stable heart size and mediastinal contours. Right hilar prominence and thickening of the lower paratracheal stripe related to known adenopathy. Unchanged right mid lung pulmonary nodule. Mild patchy bilateral suprahilar opacities corresponding to ground-glass opacity on CT earlier today. No significant change in the interim. No pneumothorax or large pleural effusion. Retained excreted IV contrast in the right renal collecting system with right hydronephrosis, partially included in the upper abdomen. IMPRESSION: 1. Stable radiographic appearance of the chest from earlier today. Similar patchy suprahilar opacities corresponding to ground-glass opacity on CT. 2. Right mid lung pulmonary nodule and thoracic adenopathy. 3. Partially included in the upper abdomen is retained excreted contrast in the right renal collecting system with right hydronephrosis. Right hydronephrosis was seen on renal ultrasound 02/18/2021. Electronically Signed   By: Keith Rake M.D.   On: 02/27/2021 15:04   DG Chest Portable 1 View  Result Date: 02/27/2021 CLINICAL DATA:  59 year old female with shortness of breath and cough. Metastatic lung cancer. EXAM: PORTABLE CHEST 1 VIEW COMPARISON:  02/15/2021 and prior studies FINDINGS: New hazy opacities within the central/upper lungs noted. RIGHT middle lobe mass and small scattered pulmonary nodules bilaterally are again identified. No pleural effusion or pneumothorax. No acute bony abnormalities are identified. IMPRESSION: New hazy opacities within the central/upper lungs which may represent edema or infection. Unchanged RIGHT middle lobe mass and bilateral pulmonary nodules compatible  with known malignancy/metastases. Electronically Signed   By: Margarette Canada M.D.   On: 02/27/2021 08:49   [place] CORE BIOPSY (LYMPH NODES)  Result Date: 02/21/2021 INDICATION: Multiple enlarged lymph nodes,  right middle lobe lung mass, liver lesions and bone lesions. EXAM: ULTRASOUND GUIDED CORE BIOPSY OF LEFT SUPRACLAVICULAR LYMPH NODE MEDICATIONS: None. ANESTHESIA/SEDATION: Versed 2.0 mg IV Moderate Sedation Time:  12 minutes. The patient was continuously monitored during the procedure by the interventional radiology nurse under my direct supervision. PROCEDURE: The procedure, risks, benefits, and alternatives were explained to the patient. Questions regarding the procedure were encouraged and answered. The patient understands and consents to the procedure. A time-out was performed prior to initiating the procedure. The left neck was prepped with chlorhexidine in a sterile fashion, and a sterile drape was applied covering the operative field. A sterile gown and sterile gloves were used for the procedure. Local anesthesia was provided with 1% Lidocaine. After localizing an enlarged left supraclavicular lymph node by ultrasound, multiple 18 gauge core biopsy samples were obtained. Core biopsy samples were submitted in formalin as well as on saline soaked Telfa gauze. Additional ultrasound was performed. COMPLICATIONS: None immediate. FINDINGS: An enlarged left supraclavicular lymph node measures approximately 3.0 x 1.8 x 2.9 cm. Solid core biopsy samples were obtained. IMPRESSION: Ultrasound-guided core biopsy performed of an enlarged left supraclavicular lymph node measuring 3 cm in greatest dimensions. Electronically Signed   By: Aletta Edouard M.D.   On: 02/21/2021 15:31    DISCHARGE EXAMINATION: Vitals:   03/20/21 0420 03/21/21 0518 03/22/21 0432 03/22/21 0805  BP: (!) 162/97 124/76 118/70 127/79  Pulse: (!) 117 (!) 113 (!) 116 (!) 120  Resp: 17 20 20 20   Temp: 98.3 F (36.8 C) 97.7 F (36.5 C) 98 F (36.7 C) 97.7 F (36.5 C)  TempSrc: Oral   Oral  SpO2: 100% 100% 90% 90%  Weight:      Height:       General appearance: Somnolent.  Minimally arousable at this time. Resp: Tachypneic.  Coarse  breath sounds crackles at the bases. Cardio: S1-S2 is tachycardic regular.  No S3-S4.  No rubs murmurs or bruit GI: Abdomen is soft.      DISPOSITION: Residential hospice  Discharge Instructions    Diet - low sodium heart healthy   Complete by: As directed    Discharge instructions   Complete by: As directed    Please review instructions on the discharge summary.  You were cared for by a hospitalist during your hospital stay. If you have any questions about your discharge medications or the care you received while you were in the hospital after you are discharged, you can call the unit and asked to speak with the hospitalist on call if the hospitalist that took care of you is not available. Once you are discharged, your primary care physician will handle any further medical issues. Please note that NO REFILLS for any discharge medications will be authorized once you are discharged, as it is imperative that you return to your primary care physician (or establish a relationship with a primary care physician if you do not have one) for your aftercare needs so that they can reassess your need for medications and monitor your lab values. If you do not have a primary care physician, you can call [phone] for a physician referral.   Increase activity slowly   Complete by: As directed    No wound care   Complete by: As directed  Allergies as of 03/22/2021      Reactions   Penicillin G Hives   Other reaction(s): HIVES Other reaction(s): HIVES   Penicillin V Itching   Penicillins       Medication List    STOP taking these medications   budesonide-formoterol 80-4.5 MCG/ACT inhaler Commonly known as: SYMBICORT   busPIRone 10 MG tablet Commonly known as: BUSPAR   dexamethasone 4 MG tablet Commonly known as: DECADRON   folic acid 1 MG tablet Commonly known as: FOLVITE   furosemide 20 MG tablet Commonly known as: LASIX   guaiFENesin 600 MG 12 hr tablet Commonly known as:  MUCINEX   ipratropium-albuterol 0.5-2.5 (3) MG/3ML Soln Commonly known as: DUONEB   metoprolol tartrate 25 MG tablet Commonly known as: LOPRESSOR   oxyCODONE-acetaminophen 5-325 MG tablet Commonly known as: PERCOCET/ROXICET   pantoprazole 40 MG tablet Commonly known as: PROTONIX   potassium chloride SA 20 MEQ tablet Commonly known as: KLOR-CON     TAKE these medications   albuterol 108 (90 Base) MCG/ACT inhaler Commonly known as: VENTOLIN HFA Inhale 2 puffs into the lungs every 4 (four) hours as needed for wheezing or shortness of breath.   clonazePAM 0.5 MG tablet Commonly known as: KLONOPIN Take 1 tablet (0.5 mg total) by mouth 3 (three) times daily as needed (anxiety). What changed: when to take this   fentaNYL 25 MCG/HR Commonly known as: [place] 1 patch onto the skin every 3 (three) days.   gabapentin 600 MG tablet Commonly known as: NEURONTIN Take 0.5 tablets (300 mg total) by mouth at bedtime.   glycopyrrolate 1 MG tablet Commonly known as: ROBINUL Take 1 tablet (1 mg total) by mouth every 4 (four) hours as needed (excessive secretions).   glycopyrrolate 0.2 MG/ML injection Commonly known as: ROBINUL Inject 1 mL (0.2 mg total) into the skin every 4 (four) hours as needed (excessive secretions).   haloperidol 0.5 MG tablet Commonly known as: HALDOL Take 1 tablet (0.5 mg total) by mouth every 4 (four) hours as needed for agitation (or delirium).   haloperidol 2 MG/ML solution Commonly known as: HALDOL Place 0.3 mLs (0.6 mg total) under the tongue every 4 (four) hours as needed for agitation (or delirium).   lidocaine-prilocaine cream Commonly known as: EMLA Apply to affected area once What changed:   how much to take  how to take this  when to take this  reasons to take this   LORazepam 2 MG/ML concentrated solution Commonly known as: ATIVAN Place 0.5 mLs (1 mg total) under the tongue every 4 (four) hours as needed for anxiety.   magic  mouthwash Soln Take 5 mLs by mouth 3 (three) times daily as needed for mouth pain.   morphine CONCENTRATE 10 MG/0.5ML Soln concentrated solution Place 0.5 mLs (10 mg total) under the tongue every 2 (two) hours as needed for severe pain.   oxyCODONE 5 MG immediate release tablet Commonly known as: Oxy IR/ROXICODONE Take 1 tablet (5 mg total) by mouth every 4 (four) hours as needed for moderate pain.   promethazine 12.5 MG tablet Commonly known as: PHENERGAN Take 1 tablet (12.5 mg total) by mouth every 6 (six) hours as needed for nausea or vomiting.   QUEtiapine 400 MG tablet Commonly known as: SEROQUEL Take 0.5 tablets (200 mg total) by mouth at bedtime.          TOTAL DISCHARGE TIME: 35 minutes  Alizon Schmeling [employer] on [email or web address]  03/22/2021, 8:41 AM

## 2021-03-22 NOTE — Progress Notes (Signed)
[facility]) Hospital Liaison RN note:  [facility] is able to offer a room today. Spoke with sister, [name] and she will be signing consents at the [facility] at 2 pm. Transport has been arranged for [facility] care team is aware. I will fax the discharge summary to the Hospice Home.   Please call with any hospice related questions or concerns.  Thank you for the opportunity to participate in this patient's care.  Zandra Abts, RN [facility] Liaison  [phone]

## 2021-03-22 NOTE — TOC Transition Note (Signed)
Transition of Care [facility]) - CM/SW Discharge Note   Patient Details  Name: Brenda Middleton MRN: [id number] Date of Birth: 12/10/1960  [facility]) CM/SW Contact:  Shelbie Hutching, RN Phone Number: 03/22/2021, 10:00 AM   Clinical Narrative:    Patient will discharge to the [facility] today.  Sister, Brenda Middleton will be signing consents.  Hospice Home would like for the patient to come on their second shift.  [facility] arranged for transport at 5pm.     Final next level of care: [place] Barriers to Discharge: Barriers Resolved   Patient Goals and CMS Choice Patient states their goals for this hospitalization and ongoing recovery are:: Residential hospice placement CMS Medicare.gov Compare Post Acute Care list provided to:: Patient Represenative (must comment) Choice offered to / list presented to : Sibling  Discharge Placement              Patient chooses bed at: Other - please specify in the comment section below: [facility]) Patient to be transferred to facility by: [facility] Name of family member notified: Brenda Middleton- sister Patient and family notified of of transfer: 03/22/21  Discharge Plan and Services In-house Referral: Hospice / Palliative Care Discharge Planning Services: CM Consult Post Acute Care Choice: Hospice          DME Arranged: N/A DME Agency: NA       HH Arranged: NA          Social Determinants of Health (SDOH) Interventions     Readmission Risk Interventions Readmission Risk Prevention Plan 03/21/2021 02/28/2021  Transportation Screening Complete Complete  Medication Review Press photographer) Complete Complete  PCP or Specialist appointment within 3-5 days of discharge Complete Complete  HRI or Home Care Consult Complete Patient refused  SW Recovery Care/Counseling Consult Complete Complete  Palliative Care Screening Complete Not [place] Not Applicable Not Applicable  Some recent data might be hidden

## 2021-03-23 ENCOUNTER — Ambulatory Visit: Payer: Medicaid Other

## 2021-03-24 ENCOUNTER — Ambulatory Visit: Payer: Medicaid Other

## 2021-03-25 ENCOUNTER — Ambulatory Visit: Payer: Medicaid Other

## 2021-03-28 ENCOUNTER — Ambulatory Visit: Payer: Medicaid Other

## 2021-03-29 ENCOUNTER — Ambulatory Visit: Payer: Medicaid Other

## 2021-03-30 ENCOUNTER — Ambulatory Visit: Payer: Medicaid Other

## 2021-03-31 ENCOUNTER — Ambulatory Visit: Payer: Medicaid Other

## 2021-03-31 ENCOUNTER — Encounter: Payer: Self-pay | Admitting: *Deleted

## 2021-04-01 ENCOUNTER — Ambulatory Visit: Payer: Medicaid Other

## 2021-04-03 DEATH — deceased

## 2021-04-04 ENCOUNTER — Ambulatory Visit: Payer: Medicaid Other | Admitting: Oncology

## 2021-04-04 ENCOUNTER — Other Ambulatory Visit: Payer: Medicaid Other

## 2021-04-04 ENCOUNTER — Ambulatory Visit: Payer: Medicaid Other

## 2021-04-04 ENCOUNTER — Inpatient Hospital Stay: Payer: Medicaid Other

## 2022-01-23 IMAGING — DX DG CHEST 1V PORT
1 series · 1 of 1 positions shown · non-contrast
Comparison: 02/15/2021 and prior studies

CLINICAL DATA: 60-year-old female with shortness of breath and
cough. Metastatic lung cancer.

EXAM:
PORTABLE CHEST 1 VIEW

[chest ap]
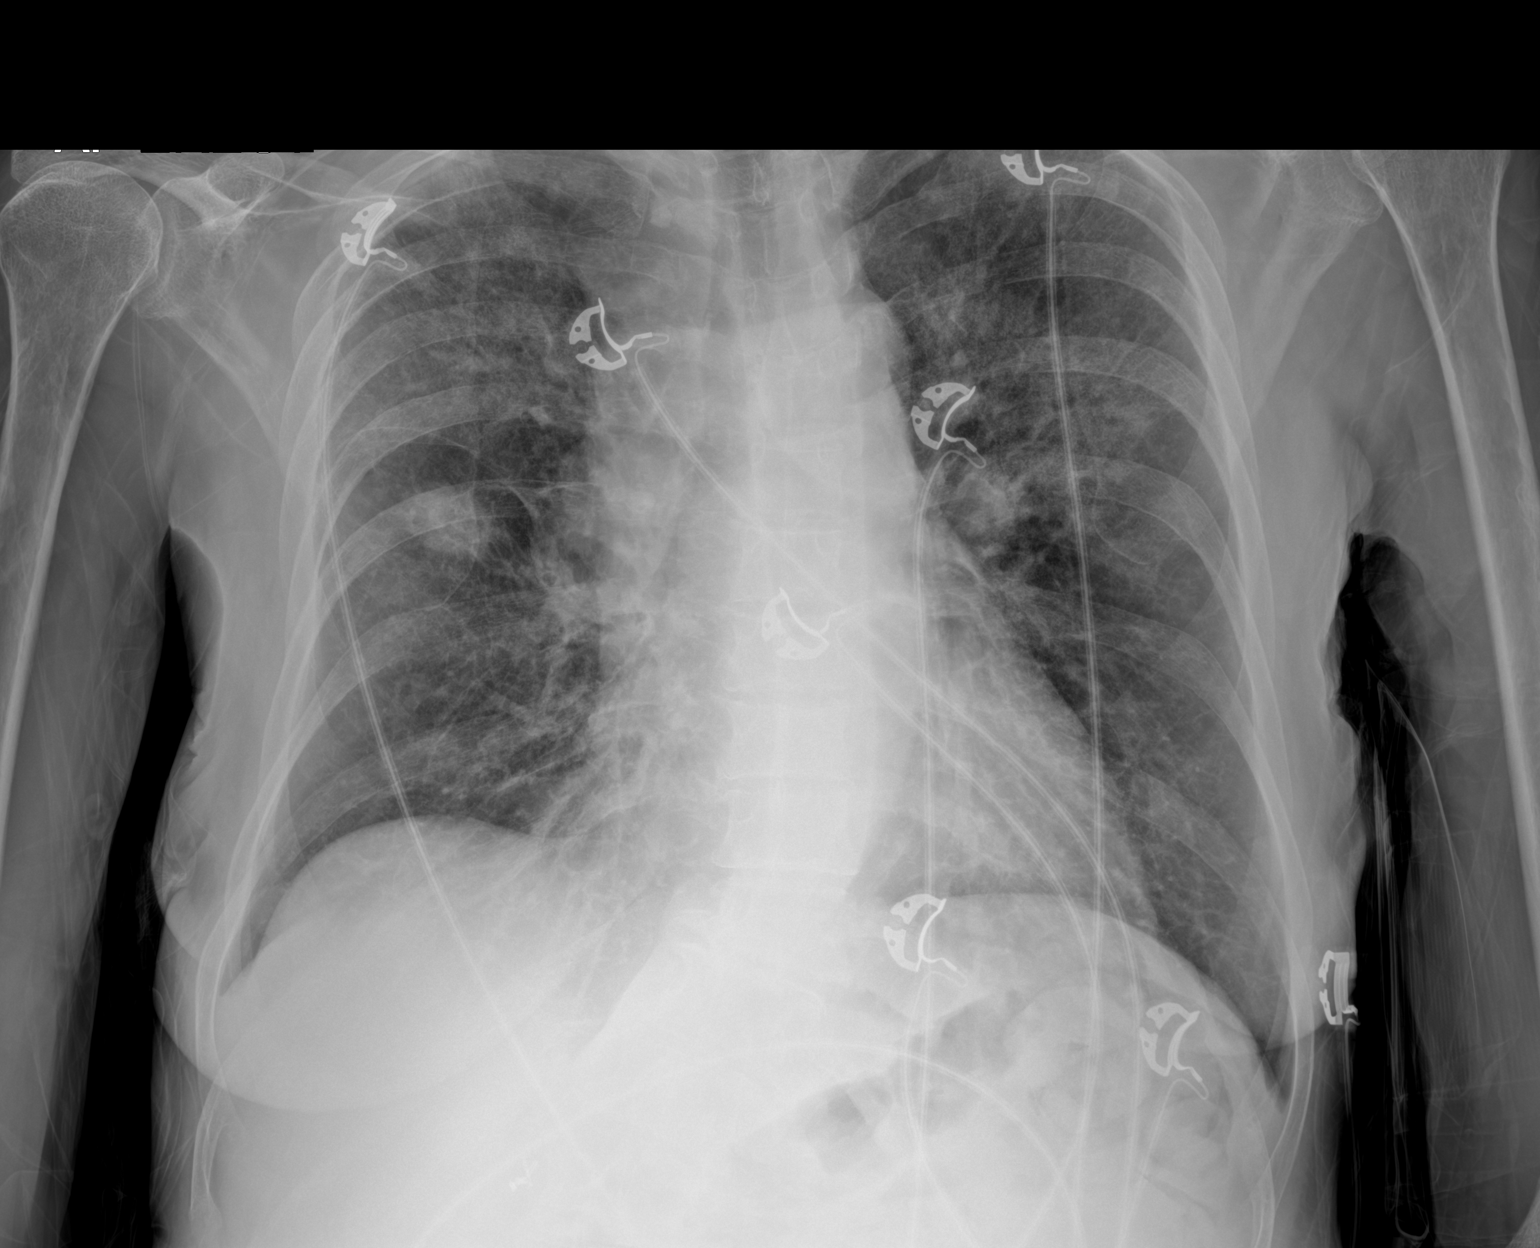

[1 of 1 positions shown; findings below may reference images not displayed]

FINDINGS: New hazy opacities within the central/upper lungs noted.

RIGHT middle lobe mass and small scattered pulmonary nodules
bilaterally are again identified.

No pleural effusion or pneumothorax.

No acute bony abnormalities are identified.
IMPRESSION: New hazy opacities within the central/upper lungs which may
represent edema or infection.

Unchanged RIGHT middle lobe mass and bilateral pulmonary nodules
compatible with known malignancy/metastases.

## 2022-02-10 IMAGING — DX DG CHEST 1V PORT
1 series · 1 of 1 positions shown · non-contrast
Comparison: 03/15/2021

CLINICAL DATA: Shortness of breath

EXAM:
PORTABLE CHEST 1 VIEW

[chest ap]
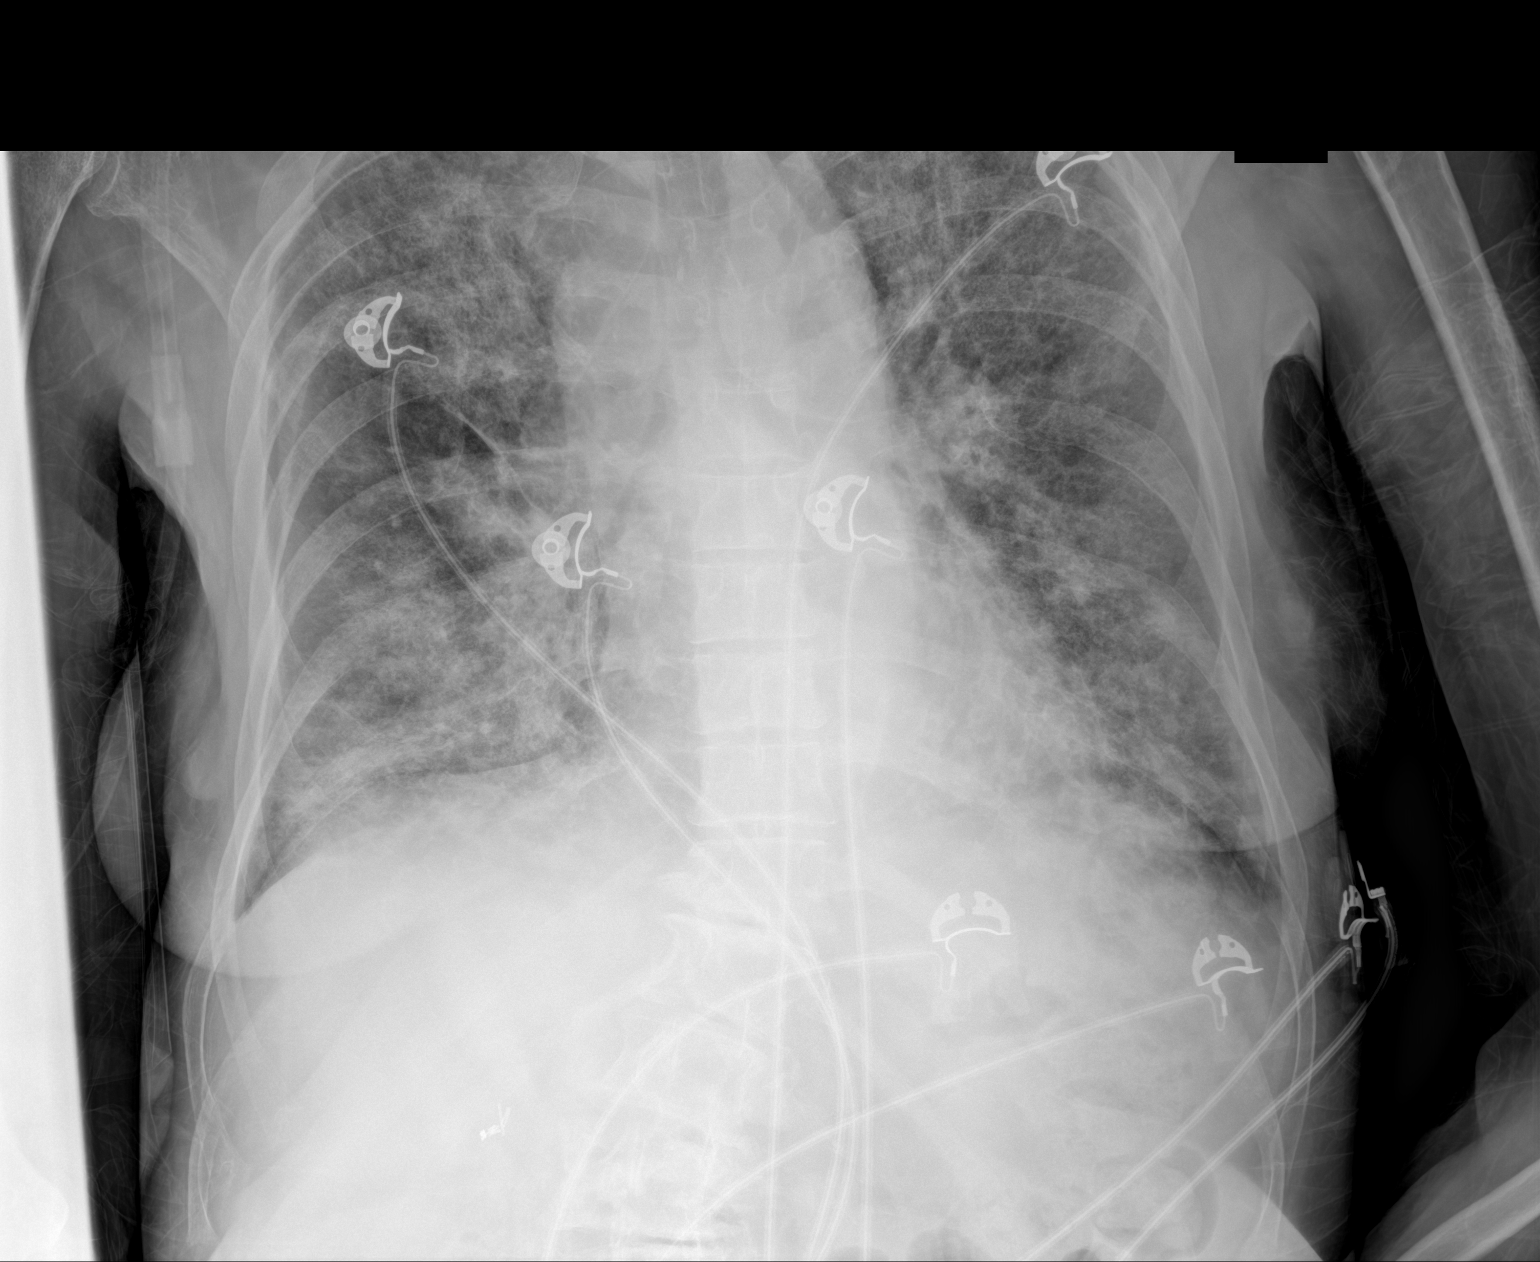

[1 of 1 positions shown; findings below may reference images not displayed]

FINDINGS: Worsening bilateral opacities throughout both lungs. No pneumothorax
or sizable pleural effusion. Normal cardiomediastinal contours.
IMPRESSION: Worsening bilateral airspace opacities.

## 2022-05-20 ENCOUNTER — Other Ambulatory Visit: Payer: Self-pay | Admitting: Nurse Practitioner

## 2022-10-12 LAB — OPIATE, QUANTITATIVE, URINE
OXYCODONE+OXYMORPHONE UR QL SCN: POSITIVE — AB
OXYMORPHONE (GC/MS): 585 ng/mL
Opiates: NEGATIVE
Oxycodone, Conf, Urine: POSITIVE — AB
Oxycodone, ur: >3000 ng/mL
Oxymorphone: POSITIVE — AB
